# Patient Record
Sex: Male | Born: 1970 | Race: Black or African American | Hispanic: Yes | Marital: Married | Smoking: Former smoker
Health system: Southern US, Community
[De-identification: ages and names within clinical notes are randomized; demographics above are authoritative.]

## PROBLEM LIST (undated history)

## (undated) DIAGNOSIS — F1010 Alcohol abuse, uncomplicated: Secondary | ICD-10-CM

## (undated) DIAGNOSIS — K7030 Alcoholic cirrhosis of liver without ascites: Secondary | ICD-10-CM

## (undated) DIAGNOSIS — C220 Liver cell carcinoma: Secondary | ICD-10-CM

## (undated) DIAGNOSIS — Z8739 Personal history of other diseases of the musculoskeletal system and connective tissue: Secondary | ICD-10-CM

## (undated) DIAGNOSIS — K7010 Alcoholic hepatitis without ascites: Secondary | ICD-10-CM

## (undated) HISTORY — PX: OTHER SURGICAL HISTORY: SHX169

## (undated) HISTORY — DX: Liver cell carcinoma: C22.0

---

## 2015-12-26 ENCOUNTER — Emergency Department (HOSPITAL_COMMUNITY): Payer: BLUE CROSS/BLUE SHIELD

## 2015-12-26 ENCOUNTER — Inpatient Hospital Stay (HOSPITAL_COMMUNITY)
Admission: EM | Admit: 2015-12-26 | Discharge: 2015-12-31 | DRG: 378 | Disposition: A | Discharge status: Home / Self Care | Payer: BLUE CROSS/BLUE SHIELD | Attending: Internal Medicine | Admitting: Internal Medicine

## 2015-12-26 ENCOUNTER — Encounter (HOSPITAL_COMMUNITY): Payer: Self-pay | Admitting: Emergency Medicine

## 2015-12-26 DIAGNOSIS — I8500 Esophageal varices without bleeding: Secondary | ICD-10-CM | POA: Diagnosis present

## 2015-12-26 DIAGNOSIS — K921 Melena: Principal | ICD-10-CM | POA: Diagnosis present

## 2015-12-26 DIAGNOSIS — R17 Unspecified jaundice: Secondary | ICD-10-CM

## 2015-12-26 DIAGNOSIS — K429 Umbilical hernia without obstruction or gangrene: Secondary | ICD-10-CM | POA: Diagnosis present

## 2015-12-26 DIAGNOSIS — Z825 Family history of asthma and other chronic lower respiratory diseases: Secondary | ICD-10-CM

## 2015-12-26 DIAGNOSIS — K7011 Alcoholic hepatitis with ascites: Secondary | ICD-10-CM | POA: Diagnosis present

## 2015-12-26 DIAGNOSIS — R0609 Other forms of dyspnea: Secondary | ICD-10-CM

## 2015-12-26 DIAGNOSIS — K766 Portal hypertension: Secondary | ICD-10-CM | POA: Diagnosis present

## 2015-12-26 DIAGNOSIS — D649 Anemia, unspecified: Secondary | ICD-10-CM | POA: Diagnosis present

## 2015-12-26 DIAGNOSIS — R600 Localized edema: Secondary | ICD-10-CM

## 2015-12-26 DIAGNOSIS — K649 Unspecified hemorrhoids: Secondary | ICD-10-CM | POA: Diagnosis present

## 2015-12-26 DIAGNOSIS — R0602 Shortness of breath: Secondary | ICD-10-CM | POA: Diagnosis not present

## 2015-12-26 DIAGNOSIS — F1010 Alcohol abuse, uncomplicated: Secondary | ICD-10-CM

## 2015-12-26 DIAGNOSIS — K7030 Alcoholic cirrhosis of liver without ascites: Secondary | ICD-10-CM

## 2015-12-26 DIAGNOSIS — D62 Acute posthemorrhagic anemia: Secondary | ICD-10-CM | POA: Diagnosis present

## 2015-12-26 DIAGNOSIS — F17210 Nicotine dependence, cigarettes, uncomplicated: Secondary | ICD-10-CM | POA: Diagnosis present

## 2015-12-26 DIAGNOSIS — E876 Hypokalemia: Secondary | ICD-10-CM | POA: Diagnosis not present

## 2015-12-26 DIAGNOSIS — E871 Hypo-osmolality and hyponatremia: Secondary | ICD-10-CM | POA: Diagnosis present

## 2015-12-26 DIAGNOSIS — Z6841 Body Mass Index (BMI) 40.0 and over, adult: Secondary | ICD-10-CM

## 2015-12-26 DIAGNOSIS — K7031 Alcoholic cirrhosis of liver with ascites: Secondary | ICD-10-CM

## 2015-12-26 DIAGNOSIS — R609 Edema, unspecified: Secondary | ICD-10-CM

## 2015-12-26 DIAGNOSIS — K7290 Hepatic failure, unspecified without coma: Secondary | ICD-10-CM

## 2015-12-26 DIAGNOSIS — K3189 Other diseases of stomach and duodenum: Secondary | ICD-10-CM | POA: Diagnosis present

## 2015-12-26 HISTORY — DX: Alcoholic hepatitis without ascites: K70.10

## 2015-12-26 HISTORY — DX: Alcoholic cirrhosis of liver without ascites: K70.30

## 2015-12-26 HISTORY — DX: Alcohol abuse, uncomplicated: F10.10

## 2015-12-26 MED ORDER — FUROSEMIDE 10 MG/ML IJ SOLN
60.0000 mg | Freq: Once | INTRAMUSCULAR | Status: AC
  Administered 2015-12-26: 60 mg via INTRAVENOUS
  Filled 2015-12-26: qty 6

## 2015-12-26 NOTE — ED Provider Notes (Signed)
[facility] DEPT Provider Note   CSN: [id number] Arrival date & time: 12/26/15  2147  First Provider Contact:  First MD Initiated Contact with Patient 12/26/15 2329     By signing my name below, I, Moises Blood, attest that this documentation has been prepared under the direction and in the presence of Rolland Porter, MD. Electronically Signed: Moises Blood, [place]. 12/27/2015 , 12:18 AM .  Patient was seen in Room IC12/IC12-01 .   History   Chief Complaint Chief Complaint  Patient presents with  . Shortness of Breath    HPI Antonio [name] is a 45 y.o. male.  HPI Patient is complaining of gradual onset bilateral lower leg swelling and lower abdominal swelling that started a week ago. His legs have swollen in the past due to fluid retention. It was noted that he has a liver problem and is followed at [facility], by Dr. Arvilla Market. He informs having a paracentesis in January when he was admitted to [facility].    He also complains feeling SOB and wheezing with activity that started about 1.5 weeks ago. He reports becoming SOB when he walks out of his house to get mail and walk back in; would have to sit down to catch his breath for relief. He also mentions having diarrhea which started about 1 week ago, about 2~3 times a day. He also states decreased and darker urination noticed 1.5 weeks ago. He denies chest pain, coughing, nausea, vomiting, abdominal pain, or fever.   He is a current smoker, about 1 pack a week.  He is currently drinking, about 3~4 beers and a pint of liquor a day. He was advised to stop drinking alcohol; however, he stopped but restarts drinking again.   PCP Dr Scotty Court GI Dr Arvilla Market at [facility]  History reviewed. No pertinent past medical history.  Patient Active Problem List   Diagnosis Date Noted  . Anemia 12/27/2015    History reviewed. No pertinent surgical history.     Home Medications    Prior to Admission medications   Medication Sig Start  Date End Date Taking? Authorizing Provider  Calcium Carb-Cholecalciferol (CALCIUM 600+D) 600-800 MG-UNIT TABS Take 1 tablet by mouth daily.   Yes Historical Provider, MD  furosemide (LASIX) 20 MG tablet Take 20 mg by mouth daily as needed for fluid.   Yes Historical Provider, MD  potassium chloride SA (K-DUR,KLOR-CON) 20 MEQ tablet Take 20 mEq by mouth 2 (two) times daily.   Yes Historical Provider, MD  spironolactone (ALDACTONE) 100 MG tablet Take 100 mg by mouth daily.   Yes Historical Provider, MD    Family History No family history on file.  Social History Social History  Substance Use Topics  . Smoking status: Current Some Day Smoker    Packs/day: 0.25    Types: Cigarettes  . Smokeless tobacco: Never Used  . Alcohol use 3.6 oz/week    6 Cans of beer per week     Comment: pint   unemployed Drinks 3-4 beers + 1/2 pint daily   Allergies   Review of patient's allergies indicates no known allergies.   Review of Systems Review of Systems  Constitutional: Negative for chills, fatigue and fever.  Respiratory: Positive for shortness of breath and wheezing.   Cardiovascular: Negative for chest pain.  Gastrointestinal: Positive for abdominal distention and diarrhea. Negative for abdominal pain, nausea and vomiting.  All other systems reviewed and are negative.    Physical Exam Updated Vital Signs BP (!) 114/54   Pulse  105   Temp 98.1 F (36.7 C) (Oral)   Resp 20   Ht 6\' 2"  (1.88 m)   Wt 225 lb (102.1 kg)   SpO2 100%   BMI 28.89 kg/m   Vital signs normal except for tachycardia   Physical Exam  Constitutional: He is oriented to person, place, and time. He appears well-developed and well-nourished.  Non-toxic appearance. He does not appear ill. No distress.  Morbidly obese  HENT:  Head: Normocephalic and atraumatic.  Right Ear: External ear normal.  Left Ear: External ear normal.  Nose: Nose normal. No mucosal edema or rhinorrhea.  Mouth/Throat: Oropharynx is  clear and moist and mucous membranes are normal. No dental abscesses or uvula swelling.  Mucous membrane have a yellow tint  Eyes: Conjunctivae and EOM are normal. Pupils are equal, round, and reactive to light.  Bilateral sclera icterus  Neck: Normal range of motion and full passive range of motion without pain. Neck supple.  Cardiovascular: Normal rate, regular rhythm and normal heart sounds.  Exam reveals no gallop and no friction rub.   No murmur heard. Pulmonary/Chest: Effort normal and breath sounds normal. No respiratory distress. He has no wheezes. He has no rhonchi. He has no rales. He exhibits no tenderness and no crepitus.  Abdominal: Soft. Normal appearance and bowel sounds are normal. He exhibits no distension. There is no tenderness. There is no rebound and no guarding.  Mild pitting edema of his lower abd wall without tenderness  Musculoskeletal: Normal range of motion. He exhibits no edema or tenderness.  2+ pitting edema of his feet and lower legs without edema above the knees  Neurological: He is alert and oriented to person, place, and time. He has normal strength. No cranial nerve deficit.  Skin: Skin is warm, dry and intact. No rash noted. No erythema. No pallor.  Psychiatric: He has a normal mood and affect. His speech is normal and behavior is normal. His mood appears not anxious.  Nursing note and vitals reviewed.    ED Treatments / Results  Labs (all labs ordered are listed, but only abnormal results are displayed) Results for orders placed or performed during the hospital encounter of 12/26/15  Comprehensive metabolic panel  Result Value Ref Range   Sodium 127 (L) 135 - 145 mmol/L   Potassium 3.9 3.5 - 5.1 mmol/L   Chloride 105 101 - 111 mmol/L   CO2 15 (L) 22 - 32 mmol/L   Glucose, Bld 102 (H) 65 - 99 mg/dL   BUN 16 6 - 20 mg/dL   Creatinine, Ser 1.60 (H) 0.61 - 1.24 mg/dL   Calcium 7.4 (L) 8.9 - 10.3 mg/dL   Total Protein 6.4 (L) 6.5 - 8.1 g/dL   Albumin  1.5 (L) 3.5 - 5.0 g/dL   AST 85 (H) 15 - 41 U/L   ALT 34 17 - 63 U/L   Alkaline Phosphatase 122 38 - 126 U/L   Total Bilirubin 5.8 (H) 0.3 - 1.2 mg/dL   GFR calc non Af Amer 51 (L) >60 mL/min   GFR calc Af Amer 59 (L) >60 mL/min   Anion gap 7 5 - 15  CBC with Differential  Result Value Ref Range   WBC 10.1 4.0 - 10.5 K/uL   RBC 1.72 (L) 4.22 - 5.81 MIL/uL   Hemoglobin 5.8 (LL) 13.0 - 17.0 g/dL   HCT 17.9 (L) 39.0 - 52.0 %   MCV 104.1 (H) 78.0 - 100.0 fL   MCH 33.7 26.0 -  34.0 pg   MCHC 32.4 30.0 - 36.0 g/dL   RDW 16.7 (H) 11.5 - 15.5 %   Platelets 131 (L) 150 - 400 K/uL   Neutrophils Relative % 67 %   Neutro Abs 6.8 1.7 - 7.7 K/uL   Lymphocytes Relative 18 %   Lymphs Abs 1.9 0.7 - 4.0 K/uL   Monocytes Relative 13 %   Monocytes Absolute 1.3 (H) 0.1 - 1.0 K/uL   Eosinophils Relative 1 %   Eosinophils Absolute 0.1 0.0 - 0.7 K/uL   Basophils Relative 1 %   Basophils Absolute 0.1 0.0 - 0.1 K/uL   RBC Morphology POLYCHROMASIA PRESENT    Smear Review PLATELETS APPEAR ADEQUATE   Protime-INR  Result Value Ref Range   Prothrombin Time 28.5 (H) 11.4 - 15.2 seconds   INR 2.62   APTT  Result Value Ref Range   aPTT 45 (H) 24 - 36 seconds  Brain natriuretic peptide  Result Value Ref Range   B Natriuretic Peptide 30.0 0.0 - 100.0 pg/mL  Troponin I  Result Value Ref Range   Troponin I 0.05 (HH) <0.03 ng/mL  Magnesium  Result Value Ref Range   Magnesium 1.2 (L) 1.7 - 2.4 mg/dL  Sample to Blood Bank  Result Value Ref Range   Blood Bank Specimen SAMPLE AVAILABLE FOR TESTING    Sample Expiration 12/29/2015   Prepare RBC  Result Value Ref Range   Order Confirmation ORDER PROCESSED BY BLOOD BANK    Laboratory interpretation all normal except anemia, renal insufficiency, elevated total bilirubin, elevated INR/PTT b/o liver disease, low magnesium, + troponin, hyponatremia, malnutrition   EKG  EKG Interpretation  Date/Time:  Monday December 26 2015 22:07:22 EDT Ventricular Rate:   110 PR Interval:    QRS Duration: 99 QT Interval:  356 QTC Calculation: 482 R Axis:   68 Text Interpretation:  Sinus tachycardia Borderline T abnormalities, inferior leads Borderline prolonged QT interval Baseline wander in lead(s) V5 No old tracing to compare Confirmed by Havilah Topor  MD-I, Jasslyn Finkel ([id number]) on 12/27/2015 12:13:19 AM       Radiology Dg Chest 2 View  Result Date: 12/27/2015 CLINICAL DATA:  Intermittent shortness of breath, bilateral lower extremity swelling for 1-1/2 weeks. History of liver disease. EXAM: CHEST  2 VIEW COMPARISON:  None. FINDINGS: Cardiomediastinal silhouette is unremarkable for this low inspiratory examination with crowded vasculature markings. The lungs are clear without pleural effusions or focal consolidations. Trachea projects midline and there is no pneumothorax. Included soft tissue planes and osseous structures are non-suspicious. Abdominal distention noted on the lateral radiograph. IMPRESSION: No active cardiopulmonary disease. Electronically Signed   By: Elon Alas M.D.   On: 12/27/2015 00:07    Procedures Procedures (including critical care time)  Medications Ordered in ED Medications  0.9 %  sodium chloride infusion (not administered)  magnesium sulfate IVPB 2 g 50 mL (2 g Intravenous Transfusing/Transfer 12/27/15 0222)  furosemide (LASIX) injection 60 mg (60 mg Intravenous Given 12/26/15 2358)     Initial Impression / Assessment and Plan / ED Course  I have reviewed the triage vital signs and the nursing notes.  Pertinent labs & imaging results that were available during my care of the patient were reviewed by me and considered in my medical decision making (see chart for details).  Clinical Course   Laboratory testing was ordered. Patient was given Lasix to see if that would help him start to diurese the excess fluid in his legs and his abdominal wall.  After  reviewing patient's laboratory results he was given IV magnesium.  I reviewed his  prior blood testing from [facility]. His last hemoglobin was January 2016 which was 13.6. His creatinines had been normal. He also was noted have a persistent hypomagnesemia.  I have talked to the patient about his test results. He's noted to be very anemic which is probably part of the reason he is having the dyspnea on exertion. He is agreeable to getting a blood transfusion. Patient was prepared to get blood transfusion. He is also agreeable to be admitting to the hospital.  He did not have any urinary output after the IV Lasix he was given.  1:47 AM Dr. Darrick Meigs, hospitalist states to admit to telemetry, observation    Final Clinical Impressions(s) / ED Diagnoses   Final diagnoses:  Anemia, unspecified  DOE (dyspnea on exertion)  Peripheral edema  Jaundice  Alcoholic cirrhosis of liver with ascites ([place])    Plan admission  Rolland Porter, MD, FACEP   I personally performed the services described in this documentation, which was scribed in my presence. The recorded information has been reviewed and considered.   Rolland Porter, MD, Barbette Or, MD 12/27/15 [phone]

## 2015-12-26 NOTE — ED Notes (Signed)
Patient transported to X-ray 

## 2015-12-26 NOTE — ED Triage Notes (Signed)
Pt states he drinks a 6 pack and a pint a day. Eye yellow, states he is being treated for edema. State legs swelling is worse and has become SOB.

## 2015-12-26 NOTE — ED Notes (Signed)
MD at bedside. 

## 2015-12-27 ENCOUNTER — Encounter (HOSPITAL_COMMUNITY): Payer: Self-pay | Admitting: *Deleted

## 2015-12-27 DIAGNOSIS — E871 Hypo-osmolality and hyponatremia: Secondary | ICD-10-CM | POA: Diagnosis present

## 2015-12-27 DIAGNOSIS — D62 Acute posthemorrhagic anemia: Secondary | ICD-10-CM | POA: Diagnosis present

## 2015-12-27 DIAGNOSIS — R0609 Other forms of dyspnea: Secondary | ICD-10-CM | POA: Diagnosis not present

## 2015-12-27 DIAGNOSIS — F1010 Alcohol abuse, uncomplicated: Secondary | ICD-10-CM | POA: Diagnosis present

## 2015-12-27 DIAGNOSIS — K766 Portal hypertension: Secondary | ICD-10-CM | POA: Diagnosis present

## 2015-12-27 DIAGNOSIS — K7011 Alcoholic hepatitis with ascites: Secondary | ICD-10-CM | POA: Diagnosis present

## 2015-12-27 DIAGNOSIS — E876 Hypokalemia: Secondary | ICD-10-CM | POA: Diagnosis not present

## 2015-12-27 DIAGNOSIS — K7030 Alcoholic cirrhosis of liver without ascites: Secondary | ICD-10-CM

## 2015-12-27 DIAGNOSIS — D500 Iron deficiency anemia secondary to blood loss (chronic): Secondary | ICD-10-CM

## 2015-12-27 DIAGNOSIS — K3189 Other diseases of stomach and duodenum: Secondary | ICD-10-CM | POA: Diagnosis present

## 2015-12-27 DIAGNOSIS — R0602 Shortness of breath: Secondary | ICD-10-CM | POA: Diagnosis present

## 2015-12-27 DIAGNOSIS — K649 Unspecified hemorrhoids: Secondary | ICD-10-CM | POA: Diagnosis present

## 2015-12-27 DIAGNOSIS — K7460 Unspecified cirrhosis of liver: Secondary | ICD-10-CM

## 2015-12-27 DIAGNOSIS — F17210 Nicotine dependence, cigarettes, uncomplicated: Secondary | ICD-10-CM | POA: Diagnosis present

## 2015-12-27 DIAGNOSIS — D649 Anemia, unspecified: Secondary | ICD-10-CM | POA: Diagnosis present

## 2015-12-27 DIAGNOSIS — R600 Localized edema: Secondary | ICD-10-CM | POA: Diagnosis present

## 2015-12-27 DIAGNOSIS — K429 Umbilical hernia without obstruction or gangrene: Secondary | ICD-10-CM | POA: Diagnosis present

## 2015-12-27 DIAGNOSIS — K7290 Hepatic failure, unspecified without coma: Secondary | ICD-10-CM | POA: Diagnosis present

## 2015-12-27 DIAGNOSIS — Z825 Family history of asthma and other chronic lower respiratory diseases: Secondary | ICD-10-CM | POA: Diagnosis not present

## 2015-12-27 DIAGNOSIS — Z6841 Body Mass Index (BMI) 40.0 and over, adult: Secondary | ICD-10-CM | POA: Diagnosis not present

## 2015-12-27 DIAGNOSIS — K921 Melena: Secondary | ICD-10-CM | POA: Diagnosis present

## 2015-12-27 DIAGNOSIS — K7031 Alcoholic cirrhosis of liver with ascites: Secondary | ICD-10-CM | POA: Diagnosis present

## 2015-12-27 DIAGNOSIS — I8500 Esophageal varices without bleeding: Secondary | ICD-10-CM | POA: Diagnosis present

## 2015-12-27 LAB — CBC WITH DIFFERENTIAL/PLATELET
BASOS ABS: 0.1 10*3/uL (ref 0.0–0.1)
Basophils Relative: 1 %
EOS ABS: 0.1 10*3/uL (ref 0.0–0.7)
EOS PCT: 1 %
HCT: 17.9 % — ABNORMAL LOW (ref 39.0–52.0)
HEMOGLOBIN: 5.8 g/dL — AB (ref 13.0–17.0)
LYMPHS PCT: 18 %
Lymphs Abs: 1.9 10*3/uL (ref 0.7–4.0)
MCH: 33.7 pg (ref 26.0–34.0)
MCHC: 32.4 g/dL (ref 30.0–36.0)
MCV: 104.1 fL — ABNORMAL HIGH (ref 78.0–100.0)
MONO ABS: 1.3 10*3/uL — AB (ref 0.1–1.0)
Monocytes Relative: 13 %
Neutro Abs: 6.8 10*3/uL (ref 1.7–7.7)
Neutrophils Relative %: 67 %
Platelets: 131 10*3/uL — ABNORMAL LOW (ref 150–400)
RBC: 1.72 MIL/uL — AB (ref 4.22–5.81)
RDW: 16.7 % — AB (ref 11.5–15.5)
Smear Review: ADEQUATE
WBC: 10.1 10*3/uL (ref 4.0–10.5)

## 2015-12-27 LAB — MAGNESIUM: MAGNESIUM: 1.2 mg/dL — AB (ref 1.7–2.4)

## 2015-12-27 LAB — COMPREHENSIVE METABOLIC PANEL
ALBUMIN: 1.5 g/dL — AB (ref 3.5–5.0)
ALK PHOS: 122 U/L (ref 38–126)
ALT: 34 U/L (ref 17–63)
ALT: 34 U/L (ref 17–63)
ANION GAP: 12 (ref 5–15)
AST: 85 U/L — AB (ref 15–41)
AST: 85 U/L — ABNORMAL HIGH (ref 15–41)
Albumin: 1.5 g/dL — ABNORMAL LOW (ref 3.5–5.0)
Alkaline Phosphatase: 115 U/L (ref 38–126)
Anion gap: 7 (ref 5–15)
BILIRUBIN TOTAL: 5.8 mg/dL — AB (ref 0.3–1.2)
BILIRUBIN TOTAL: 5.6 mg/dL — AB (ref 0.3–1.2)
BUN: 16 mg/dL (ref 6–20)
BUN: 17 mg/dL (ref 6–20)
CALCIUM: 7.4 mg/dL — AB (ref 8.9–10.3)
CO2: 15 mmol/L — ABNORMAL LOW (ref 22–32)
CO2: 16 mmol/L — ABNORMAL LOW (ref 22–32)
CREATININE: 1.6 mg/dL — AB (ref 0.61–1.24)
Calcium: 7.6 mg/dL — ABNORMAL LOW (ref 8.9–10.3)
Chloride: 105 mmol/L (ref 101–111)
Chloride: 102 mmol/L (ref 101–111)
Creatinine, Ser: 1.72 mg/dL — ABNORMAL HIGH (ref 0.61–1.24)
GFR calc Af Amer: 59 mL/min — ABNORMAL LOW (ref >60)
GFR, EST AFRICAN AMERICAN: 54 mL/min — AB (ref >60)
GFR, EST NON AFRICAN AMERICAN: 51 mL/min — AB (ref >60)
GFR, EST NON AFRICAN AMERICAN: 47 mL/min — AB (ref >60)
GLUCOSE: 102 mg/dL — AB (ref 65–99)
Glucose, Bld: 107 mg/dL — ABNORMAL HIGH (ref 65–99)
POTASSIUM: 4.1 mmol/L (ref 3.5–5.1)
Potassium: 3.9 mmol/L (ref 3.5–5.1)
Sodium: 127 mmol/L — ABNORMAL LOW (ref 135–145)
Sodium: 130 mmol/L — ABNORMAL LOW (ref 135–145)
TOTAL PROTEIN: 6.4 g/dL — AB (ref 6.5–8.1)
TOTAL PROTEIN: 6.4 g/dL — AB (ref 6.5–8.1)

## 2015-12-27 LAB — HEMOGLOBIN AND HEMATOCRIT, BLOOD
HEMATOCRIT: 21.2 % — AB (ref 39.0–52.0)
HEMATOCRIT: 20.4 % — AB (ref 39.0–52.0)
HEMOGLOBIN: 6.8 g/dL — AB (ref 13.0–17.0)
HEMOGLOBIN: 6.8 g/dL — AB (ref 13.0–17.0)

## 2015-12-27 LAB — CBC
HEMATOCRIT: 18.8 % — AB (ref 39.0–52.0)
Hemoglobin: 6 g/dL — CL (ref 13.0–17.0)
MCH: 32.8 pg (ref 26.0–34.0)
MCHC: 31.9 g/dL (ref 30.0–36.0)
MCV: 102.7 fL — AB (ref 78.0–100.0)
Platelets: 120 10*3/uL — ABNORMAL LOW (ref 150–400)
RBC: 1.83 MIL/uL — ABNORMAL LOW (ref 4.22–5.81)
RDW: 17 % — AB (ref 11.5–15.5)
WBC: 10.6 10*3/uL — ABNORMAL HIGH (ref 4.0–10.5)

## 2015-12-27 LAB — PREPARE RBC (CROSSMATCH)

## 2015-12-27 LAB — APTT: aPTT: 45 seconds — ABNORMAL HIGH (ref 24–36)

## 2015-12-27 LAB — TROPONIN I
Troponin I: 0.05 ng/mL (ref <0.03)
Troponin I: 0.04 ng/mL (ref <0.03)
Troponin I: 0.04 ng/mL (ref <0.03)
Troponin I: 0.04 ng/mL (ref <0.03)

## 2015-12-27 LAB — SAMPLE TO BLOOD BANK

## 2015-12-27 LAB — URINALYSIS, ROUTINE W REFLEX MICROSCOPIC
GLUCOSE, UA: NEGATIVE mg/dL
KETONES UR: NEGATIVE mg/dL
LEUKOCYTES UA: NEGATIVE
Nitrite: NEGATIVE
Specific Gravity, Urine: 1.01 (ref 1.005–1.030)
pH: 6 (ref 5.0–8.0)

## 2015-12-27 LAB — MRSA PCR SCREENING: MRSA BY PCR: NEGATIVE

## 2015-12-27 LAB — URINE MICROSCOPIC-ADD ON

## 2015-12-27 LAB — ABO/RH: ABO/RH(D): O NEG

## 2015-12-27 LAB — BRAIN NATRIURETIC PEPTIDE: B NATRIURETIC PEPTIDE 5: 30 pg/mL (ref 0.0–100.0)

## 2015-12-27 LAB — PROTIME-INR
INR: 2.62
PROTHROMBIN TIME: 28.5 s — AB (ref 11.4–15.2)

## 2015-12-27 MED ORDER — VITAMIN K1 10 MG/ML IJ SOLN
10.0000 mg | Freq: Every day | INTRAVENOUS | Status: AC
  Administered 2015-12-27 – 2015-12-29 (×3): 10 mg via INTRAVENOUS
  Filled 2015-12-27 (×3): qty 1

## 2015-12-27 MED ORDER — SODIUM CHLORIDE 0.9 % IV SOLN
80.0000 mg | Freq: Once | INTRAVENOUS | Status: AC
  Administered 2015-12-27: 80 mg via INTRAVENOUS
  Filled 2015-12-27: qty 80

## 2015-12-27 MED ORDER — SODIUM CHLORIDE 0.9 % IV SOLN
50.0000 ug/h | INTRAVENOUS | Status: DC
  Administered 2015-12-27 – 2015-12-28 (×3): 50 ug/h via INTRAVENOUS
  Filled 2015-12-27 (×6): qty 1

## 2015-12-27 MED ORDER — DEXTROSE 5 % IV SOLN
1.0000 g | INTRAVENOUS | Status: DC
  Administered 2015-12-27 – 2015-12-29 (×3): 1 g via INTRAVENOUS
  Filled 2015-12-27 (×3): qty 10

## 2015-12-27 MED ORDER — VITAMIN B-1 100 MG PO TABS
100.0000 mg | ORAL_TABLET | Freq: Every day | ORAL | Status: DC
  Administered 2015-12-27 – 2015-12-31 (×4): 100 mg via ORAL
  Filled 2015-12-27 (×5): qty 1

## 2015-12-27 MED ORDER — OCTREOTIDE LOAD VIA INFUSION
50.0000 ug | Freq: Once | INTRAVENOUS | Status: AC
  Administered 2015-12-27: 50 ug via INTRAVENOUS
  Filled 2015-12-27: qty 25

## 2015-12-27 MED ORDER — SODIUM CHLORIDE 0.9 % IV SOLN: Freq: Once | INTRAVENOUS | Status: AC

## 2015-12-27 MED ORDER — MAGNESIUM SULFATE 2 GM/50ML IV SOLN
2.0000 g | Freq: Once | INTRAVENOUS | Status: AC
  Administered 2015-12-27: 2 g via INTRAVENOUS
  Filled 2015-12-27: qty 50

## 2015-12-27 MED ORDER — THIAMINE HCL 100 MG/ML IJ SOLN
100.0000 mg | Freq: Every day | INTRAMUSCULAR | Status: DC
  Administered 2015-12-28: 100 mg via INTRAVENOUS
  Filled 2015-12-27: qty 2

## 2015-12-27 MED ORDER — SODIUM CHLORIDE 0.9 % IV SOLN
8.0000 mg/h | INTRAVENOUS | Status: DC
  Administered 2015-12-27 – 2015-12-28 (×3): 8 mg/h via INTRAVENOUS
  Filled 2015-12-27 (×6): qty 80

## 2015-12-27 MED ORDER — SODIUM CHLORIDE 0.9 % IV SOLN
Freq: Once | INTRAVENOUS | Status: AC
  Administered 2015-12-28: 12:00:00 via INTRAVENOUS

## 2015-12-27 MED ORDER — OCTREOTIDE ACETATE 50 MCG/ML IJ SOLN: 50.0000 ug | Freq: Once | INTRAMUSCULAR | Status: DC

## 2015-12-27 MED ORDER — FOLIC ACID 1 MG PO TABS
1.0000 mg | ORAL_TABLET | Freq: Every day | ORAL | Status: DC
  Administered 2015-12-27 – 2015-12-31 (×4): 1 mg via ORAL
  Filled 2015-12-27 (×5): qty 1

## 2015-12-27 MED ORDER — PANTOPRAZOLE SODIUM 40 MG IV SOLR: 40.0000 mg | Freq: Two times a day (BID) | INTRAVENOUS | Status: DC

## 2015-12-27 MED ORDER — LORAZEPAM 1 MG PO TABS: 1.0000 mg | ORAL_TABLET | Freq: Four times a day (QID) | ORAL | Status: AC | PRN

## 2015-12-27 MED ORDER — ADULT MULTIVITAMIN W/MINERALS CH
1.0000 | ORAL_TABLET | Freq: Every day | ORAL | Status: DC
  Administered 2015-12-27 – 2015-12-31 (×4): 1 via ORAL
  Filled 2015-12-27 (×4): qty 1

## 2015-12-27 MED ORDER — PREDNISONE 20 MG PO TABS
40.0000 mg | ORAL_TABLET | Freq: Every day | ORAL | Status: DC
  Administered 2015-12-27: 40 mg via ORAL
  Filled 2015-12-27 (×2): qty 2

## 2015-12-27 MED ORDER — LORAZEPAM 2 MG/ML IJ SOLN: 1.0000 mg | Freq: Four times a day (QID) | INTRAMUSCULAR | Status: AC | PRN

## 2015-12-27 MED ORDER — SODIUM CHLORIDE 0.9 % IV SOLN: Freq: Once | INTRAVENOUS | Status: DC

## 2015-12-27 NOTE — ED Notes (Signed)
MD at bedside. 

## 2015-12-27 NOTE — ED Notes (Signed)
CRITICAL VALUE ALERT  Critical value received:  Hgb 5.8  Date of notification:  12/27/15  Time of notification:  0040  Critical value read back:Yes.    Nurse who received alert: Toma Deiters  MD notified (1st page):  Dr Tomi Bamberger  Time of first page:  0040  MD notified (2nd page):  Time of second page:  Responding MD:  Dr Tomi Bamberger  Time MD responded:  0040

## 2015-12-27 NOTE — H&P (Addendum)
[facility] H&P   Patient Demographics:    Antonio [name], is a 45 y.o. male  MRN: [id number]  DOB - 1971/01/01  Admit Date - 12/26/2015  Outpatient Primary MD for the patient is TAPPER,DAVID B, MD  Referring MD/NP/PA: Dr Tomi Bamberger  Patient coming from: Home  Chief Complaint  Patient presents with  . Shortness of Breath      HPI:    Antonio [name]  is a 45 y.o. male, With history of alcohol liver disease who came to the hospital with worsening leg swelling and shortness of breath on exertion. Patient usually follows [facility] for liver cirrhosis, but has not seen GI since 2016. He denies nausea vomiting, but admits to having diarrhea with black colored stool and also intermittently with frank blood. He denies chest pain. Patient drinks 3-4 beers and a pint of liquor everyday. In the ED lab work revealed anemia with hemoglobin 5.8    Review of systems:    In addition to the HPI above,  No Fever-chills, No Headache, No changes with Vision or hearing, No problems swallowing food or Liquids, No Chest pain, Cough or Shortness of Breath, No Blood in stool or Urine, No dysuria, No new skin rashes or bruises, No new joints pains-aches,  No new weakness, tingling, numbness in any extremity, No recent weight gain or loss, No polyuria, polydypsia or polyphagia, No significant Mental Stressors.  A full 10 point Review of Systems was done, except as stated above, all other Review of Systems were negative.   With Past History of the following :      Social History:     Social History  Substance Use Topics  . Smoking status: Current Some Day Smoker    Packs/day: 0.25    Types: Cigarettes  . Smokeless tobacco: Never Used  . Alcohol use 3.6 oz/week    6 Cans of beer per week     Comment: pint         Family History :   Patient's mother has history of COPD   Home Medications:   Prior to Admission  medications   Medication Sig Start Date End Date Taking? Authorizing Provider  Calcium Carb-Cholecalciferol (CALCIUM 600+D) 600-800 MG-UNIT TABS Take 1 tablet by mouth daily.   Yes Historical Provider, MD  furosemide (LASIX) 20 MG tablet Take 20 mg by mouth daily as needed for fluid.   Yes Historical Provider, MD  potassium chloride SA (K-DUR,KLOR-CON) 20 MEQ tablet Take 20 mEq by mouth 2 (two) times daily.   Yes Historical Provider, MD  spironolactone (ALDACTONE) 100 MG tablet Take 100 mg by mouth daily.   Yes Historical Provider, MD     Allergies:    No Known Allergies   Physical Exam:   Vitals  Blood pressure (!) 114/54, pulse 105, temperature 98.1 F (36.7 C), temperature source Oral, resp. rate 20, height 6\' 2"  (1.88 m), weight 102.1 kg (225 lb), SpO2 100 %.   1. General african american male  lying in bed in NAD, cooperative  with exam  2. Normal affect and insight, Awake Alert, Oriented X 3.  3. No F.N deficits, ALL C.Nerves Intact, Strength 5/5 all 4 extremities, Sensation intact all 4 extremities, Plantars down going.  4. Ears and Eyes appear Normal, Conjunctivae clear, PERRLA. Moist Oral Mucosa.  5. Supple Neck, No JVD, No cervical lymphadenopathy appriciated, No Carotid Bruits.  6. Symmetrical Chest wall movement, Good air movement bilaterally, CTAB.  7. RRR, No Gallops, Rubs or Murmurs, No Parasternal Heave. B/L 2+ pitting edema of the lower extremities  8. Positive Bowel Sounds, Abdomen Soft,distended, , No organomegaly appriciated,No rebound -guarding or rigidity.  9.  No Cyanosis, Normal Skin Turgor, No Skin Rash or Bruise.  10. Good muscle tone,  joints appear normal , no effusions, Normal ROM.      Data Review:    CBC  Recent Labs Lab 12/26/15 2230  WBC 10.1  HGB 5.8*  HCT 17.9*  PLT 131*  MCV 104.1*  MCH 33.7  MCHC 32.4  RDW 16.7*  LYMPHSABS 1.9  MONOABS 1.3*  EOSABS 0.1  BASOSABS 0.1    ------------------------------------------------------------------------------------------------------------------  Chemistries   Recent Labs Lab 12/26/15 2230  NA 127*  K 3.9  CL 105  CO2 15*  GLUCOSE 102*  BUN 16  CREATININE 1.60*  CALCIUM 7.4*  MG 1.2*  AST 85*  ALT 34  ALKPHOS 122  BILITOT 5.8*   ------------------------------------------------------------------------------------------------------------------  ------------------------------------------------------------------------------------------------------------------ GFR: Estimated Creatinine Clearance: 75.9 mL/min (by C-G formula based on SCr of 1.6 mg/dL). Liver Function Tests:  Recent Labs Lab 12/26/15 2230  AST 85*  ALT 34  ALKPHOS 122  BILITOT 5.8*  PROT 6.4*  ALBUMIN 1.5*   Coagulation Profile:  Recent Labs Lab 12/26/15 2230  INR 2.62   Cardiac Enzymes:  Recent Labs Lab 12/26/15 2230  TROPONINI 0.05*   --------------------------------------------------------------------------------------------------------------- Urine analysis: No results found for: COLORURINE, APPEARANCEUR, LABSPEC, PHURINE, GLUCOSEU, HGBUR, BILIRUBINUR, KETONESUR, PROTEINUR, UROBILINOGEN, NITRITE, LEUKOCYTESUR    ----------------------------------------------------------------------------------------------------------------   Imaging Results:    Dg Chest 2 View  Result Date: 12/27/2015 CLINICAL DATA:  Intermittent shortness of breath, bilateral lower extremity swelling for 1-1/2 weeks. History of liver disease. EXAM: CHEST  2 VIEW COMPARISON:  None. FINDINGS: Cardiomediastinal silhouette is unremarkable for this low inspiratory examination with crowded vasculature markings. The lungs are clear without pleural effusions or focal consolidations. Trachea projects midline and there is no pneumothorax. Included soft tissue planes and osseous structures are non-suspicious. Abdominal distention noted on the lateral  radiograph. IMPRESSION: No active cardiopulmonary disease. Electronically Signed   By: Elon Alas M.D.   On: 12/27/2015 00:07    My personal review of EKG: Sinus tachycardia   Assessment & Plan:    Active Problems:   Anemia   Hyponatremia   Liver cirrhosis   Alcohol abuse  1. Anemia- likely from GI bleed, patient gives history of melena. Will transfuse 2 units PRBC. Check CBC in a.m. 2. Melena- patient will need EGD, consult GI in a.m. 3. Hyponatremia- likely from liver cirrhosis. Follow BMP in a.m. 4. Alcohol abuse- start CIWA protocol, folate and thiamine. 5. Dyspnea- likely from anemia, chest x-ray shows no acute abnormality. Has mild elevation of troponin 0.05, EKG shows sinus tachycardia. We'll cycle troponin.    DVT Prophylaxis-    SCDs   AM Labs Ordered, also please review Full Orders  Family Communication: Admission, patients condition and plan of care including tests being ordered have been discussed with the patient and his mother at bedside who indicate understanding and  agree with the plan and Code Status.  Code Status:  Full code  Admission status: Observation  Time spent in minutes : 60 min   Lorie Cleckley S M.D on 12/27/2015 at 2:17 AM  Between 7am to 7pm - Pager - [phone]. After 7pm go to [email or web address] - password [facility]  [facility] - Office  [phone]

## 2015-12-27 NOTE — Progress Notes (Signed)
Late entry:   Around 1450 called and spoke with primary nurse for patient regarding post-transfusion H/H. This appears to have been done around 0950 and was 6.8. Received report that patient had large bloody BM around 1400 this afternoon. I ordered a stat H/H that came back at 6.8.  I have spoken to the blood bank and asked that 4 units be available for patient. We will go ahead and transfuse 1 unit PRBCs now.   We will start Octreotide now, with 50 mcg bolus X 1 then 50 mcg/hour infusion. Vit K has been ordered per attending GI physician. Plans for EGD tomorrow with Dr. Oneida Alar.   Orvil Feil, ANP-BC [facility]    REVIEWED. AGREE. NO ADDITIONAL RECOMMENDATIONS.

## 2015-12-27 NOTE — Consult Note (Signed)
Referring Provider: Dr. Darrick Meigs  Primary Care Physician:  Deloria Lair, MD Primary Gastroenterologist:  Dr. Halford Chessman at [facility], Dr. Oneida Alar during admission at [facility]   Date of Admission: 12/27/15 Date of Consultation: 12/27/15  Reason for Consultation:  Melena, cirrhosis  HPI:  Antonio [name] is a 45 y.o. year old male with a history of ETOH hepatitis, ETOH cirrhosis, seen by [facility] in Jan 2016 (Dr. Eddie Dibbles Schmeltzer) in evaluation. At that time, Hep C antibody negative, advised he was not a transplant candidate due to ETOH abuse at the time but was counseled that he would be followed. Variceal screening was recommended but never completed, as he was lost to follow-up and never showed for subsequent visits. He was then admitted to [facility] late Nov 2016 with worsening leg edema, ascites, shortness of breath in the setting of continued ETOH use. MELD at that time 22. No evidence of SBP or suspicion for SBP. He was discharged with Lasix 40 mg daily and Aldactone 100 mg daily. US abdomen last completed Nov 2016 without [place]. Last paracentesis Jan 2016 with 2.1 liters removed. Hgb on discharge in Nov 2016 in the 13 range.  States he noted onset of black, tarry stool a few days ago. No hematemesis. No N/V. No abdominal pain. Belly felt tight, felt short of breath, legs swelling, which prompted seeking medical attention. Denies NSAIDs or aspirin powders. No mental status changes or confusion. No fever or chills. No fatigue or weakness. No prior EGD. Thinks he had a colonoscopy about 3 years ago in [place]. Drinks about 4-5 beers a day and 1/2 pint of liquor daily. States he has been told before that if he keeps drinking he will die. He thinks he may be willing to seek help to stop. Last saw black stool yesterday.   As an outpatient takes Lasix 20 mg daily as needed and aldactone 100 mg daily.    Past Medical History:  Diagnosis Date  . Alcohol abuse   . Alcoholic hepatitis   . Cirrhosis with alcoholism [facility])      Past Surgical History:  Procedure Laterality Date  . Colonoscopy     per patient around 2014 in [place]     Prior to Admission medications   Medication Sig Start Date End Date Taking? Authorizing Provider  Calcium Carb-Cholecalciferol (CALCIUM 600+D) 600-800 MG-UNIT TABS Take 1 tablet by mouth daily.   Yes Historical Provider, MD  furosemide (LASIX) 20 MG tablet Take 20 mg by mouth daily as needed for fluid.   Yes Historical Provider, MD  potassium chloride SA (K-DUR,KLOR-CON) 20 MEQ tablet Take 20 mEq by mouth 2 (two) times daily.   Yes Historical Provider, MD  spironolactone (ALDACTONE) 100 MG tablet Take 100 mg by mouth daily.   Yes Historical Provider, MD    Current Facility-Administered Medications  Medication Dose Route Frequency Provider Last Rate Last Dose  . 0.9 %  sodium chloride infusion   Intravenous Once Rolland Porter, MD      . folic acid (FOLVITE) tablet 1 mg  1 mg Oral Daily Oswald Hillock, MD      . LORazepam (ATIVAN) tablet 1 mg  1 mg Oral Q6H PRN Oswald Hillock, MD       Or  . LORazepam (ATIVAN) injection 1 mg  1 mg Intravenous Q6H PRN Oswald Hillock, MD      . multivitamin with minerals tablet 1 tablet  1 tablet Oral Daily Oswald Hillock, MD      .  thiamine (VITAMIN B-1) tablet 100 mg  100 mg Oral Daily Oswald Hillock, MD       Or  . thiamine (B-1) injection 100 mg  100 mg Intravenous Daily Oswald Hillock, MD        Allergies as of 12/26/2015  . (No Known Allergies)    Family History  Problem Relation Age of Onset  . Colon cancer Neg Hx   . Liver disease Neg Hx     Social History   Social History  . Marital status: Married    Spouse name: [name]  . Number of children: [name]  . Years of education: [name]   Occupational History  . Not on file.   Social History Main Topics  . Smoking status: Current Some Day Smoker    Packs/day: 0.25    Types: Cigarettes  . Smokeless tobacco: Never Used  . Alcohol use 3.6 oz/week    6 Cans of beer per week     Comment: drinks 4-5  beers a day and 1/2 pint of liquor daily   . Drug use: No  . Sexual activity: Yes   Other Topics Concern  . Not on file   Social History Narrative  . No narrative on file    Review of Systems: As noted in HPI   Physical Exam: Vital signs in last 24 hours: Temp:  [98.1 F (36.7 C)-98.5 F (36.9 C)] 98.1 F (36.7 C) (08/01 0824) Pulse Rate:  [103-112] 110 (08/01 0655) Resp:  [14-25] 22 (08/01 0655) BP: (103-132)/(54-74) 132/66 (08/01 0700) SpO2:  [100 %] 100 % (08/01 0630) Weight:  [225 lb (102.1 kg)-350 lb 1.5 oz (158.8 kg)] 350 lb 1.5 oz (158.8 kg) (08/01 0241) Last BM Date: 12/26/15 General:   Alert, well-developed, appears weak, older than stated age Head:  Normocephalic and atraumatic. Eyes:  Sclera clear, mild icterus  Ears:  Normal auditory acuity. Nose:  No deformity, discharge,  or lesions. Mouth:  No deformity or lesions, dentition normal. Lungs:  Clear throughout to auscultation.  Heart:  Regular rate and rhythm Abdomen:  Soft, obese, no TTP, no rebound or guarding, generalized anasarca,  Rectal:  Deferred until time of colonoscopy.   Msk:  Symmetrical without gross deformities. Normal posture. Extremities:  2-3+ pitting lower extremity edema, pedal edema, anasarca  Neurologic:  Alert and  oriented x4; negative asterixis  Skin:  Intact without significant lesions or rashes. Psych:  Alert and cooperative. Flat affect   Intake/Output from previous day: 07/31 0701 - 08/01 0700 In: 306 [Blood:306] Out: -  Intake/Output this shift: Total I/O In: 120 [P.O.:120] Out: -   Lab Results:  Recent Labs  12/26/15 2230 12/27/15 0345  WBC 10.1 10.6*  HGB 5.8* 6.0*  HCT 17.9* 18.8*  PLT 131* 120*   BMET  Recent Labs  12/26/15 2230 12/27/15 0345  NA 127* 130*  K 3.9 4.1  CL 105 102  CO2 15* 16*  GLUCOSE 102* 107*  BUN 16 17  CREATININE 1.60* 1.72*  CALCIUM 7.4* 7.6*   LFT  Recent Labs  12/26/15 2230 12/27/15 0345  PROT 6.4* 6.4*  ALBUMIN 1.5*  1.5*  AST 85* 85*  ALT 34 34  ALKPHOS 122 115  BILITOT 5.8* 5.6*   PT/INR  Recent Labs  12/26/15 2230  LABPROT 28.5*  INR 2.62    Studies/Results: Dg Chest 2 View  Result Date: 12/27/2015 CLINICAL DATA:  Intermittent shortness of breath, bilateral lower extremity swelling for 1-1/2 weeks. History of liver disease.  EXAM: CHEST  2 VIEW COMPARISON:  None. FINDINGS: Cardiomediastinal silhouette is unremarkable for this low inspiratory examination with crowded vasculature markings. The lungs are clear without pleural effusions or focal consolidations. Trachea projects midline and there is no pneumothorax. Included soft tissue planes and osseous structures are non-suspicious. Abdominal distention noted on the lateral radiograph. IMPRESSION: No active cardiopulmonary disease. Electronically Signed   By: Elon Alas M.D.   On: 12/27/2015 00:07    Impression: 45 year old male with history of ETOH cirrhosis, ETOH hepatitis, ETOH abuse, presenting with decompensated cirrhosis, acute blood loss anemia, melena. Receiving 2/2 units PRBCs at time of consultation. Has continued to drink alcohol daily despite recommendations for abstinence, with poor prognosis. Current discriminant function 66-84, MELD 28 on admission. He has already eaten breakfast this morning; he has been made NPO and will plan on EGD on 8/2 with Propofol. In interim, will need close monitoring, serial monitoring of H/H, transfusion as necessary. Antibiotic prophylaxis (Rocephin) once every 24 hours started as he has a known GI bleed in the setting of cirrhosis. Will discuss prednisolone therapy with Dr. Oneida Alar but will address acute blood loss anemia first. Further addressing of anasarca, ascites after EGD. Patient's overall prognosis is poor with his current behavior and clinical findings.    Plan: Start PPI infusion Rocephin IV every 24 hours NPO Follow H/H Transfuse as necessary EGD with Propofol on 8/2 by Dr. Oneida Alar   Further diuretic management after EGD completed.      LOS: 0 days    12/27/2015, 8:51 AM

## 2015-12-27 NOTE — Progress Notes (Signed)
hgb 6.0 up from 5.8  Is currently getting one unit of PRBCs transfused with one more to go

## 2015-12-27 NOTE — Progress Notes (Signed)
MD notified of Hgb 6.8 via text page. No new orders received at this time. Will continue to monitor.

## 2015-12-27 NOTE — Progress Notes (Signed)
Patient seen and examined, database reviewed. No family members at bedside. Patient admitted on 8/1 with complaints of melena and shortness of breath. He has a history of alcoholic liver disease who has increased leg swelling and shortness of breath. Also his hemoglobin was down to 5.8. Has been transfused 2 units PRBCs, will recheck posttransfusion CBC to see if further blood as needed. Has been seen by GI, would anticipate that they would schedule an EGD. We'll defer diuretic therapy to GI recommendations. We'll continue to follow.  Domingo Mend, MD [facility] Pager: [phone]

## 2015-12-28 ENCOUNTER — Inpatient Hospital Stay (HOSPITAL_COMMUNITY): Payer: BLUE CROSS/BLUE SHIELD | Admitting: Anesthesiology

## 2015-12-28 ENCOUNTER — Encounter (HOSPITAL_COMMUNITY): Payer: Self-pay | Admitting: *Deleted

## 2015-12-28 ENCOUNTER — Encounter (HOSPITAL_COMMUNITY)
Admission: EM | Disposition: A | Discharge status: Home / Self Care | Payer: Self-pay | Source: Home / Self Care | Attending: Internal Medicine

## 2015-12-28 DIAGNOSIS — I8500 Esophageal varices without bleeding: Secondary | ICD-10-CM

## 2015-12-28 DIAGNOSIS — K3189 Other diseases of stomach and duodenum: Secondary | ICD-10-CM

## 2015-12-28 DIAGNOSIS — D649 Anemia, unspecified: Secondary | ICD-10-CM

## 2015-12-28 DIAGNOSIS — K921 Melena: Principal | ICD-10-CM

## 2015-12-28 HISTORY — PX: ESOPHAGOGASTRODUODENOSCOPY (EGD) WITH PROPOFOL: SHX5813

## 2015-12-28 LAB — CBC
HEMATOCRIT: 20.2 % — AB (ref 39.0–52.0)
HEMOGLOBIN: 6.8 g/dL — AB (ref 13.0–17.0)
MCH: 31.9 pg (ref 26.0–34.0)
MCHC: 33.7 g/dL (ref 30.0–36.0)
MCV: 94.8 fL (ref 78.0–100.0)
Platelets: 93 10*3/uL — ABNORMAL LOW (ref 150–400)
RBC: 2.13 MIL/uL — ABNORMAL LOW (ref 4.22–5.81)
RDW: 19.3 % — AB (ref 11.5–15.5)
WBC: 8.9 10*3/uL (ref 4.0–10.5)

## 2015-12-28 LAB — COMPREHENSIVE METABOLIC PANEL
ALBUMIN: 1.5 g/dL — AB (ref 3.5–5.0)
ALT: 32 U/L (ref 17–63)
ANION GAP: 0 — AB (ref 5–15)
AST: 74 U/L — AB (ref 15–41)
Alkaline Phosphatase: 108 U/L (ref 38–126)
BUN: 18 mg/dL (ref 6–20)
CHLORIDE: 106 mmol/L (ref 101–111)
CO2: 21 mmol/L — ABNORMAL LOW (ref 22–32)
Calcium: 7.3 mg/dL — ABNORMAL LOW (ref 8.9–10.3)
Creatinine, Ser: 1.46 mg/dL — ABNORMAL HIGH (ref 0.61–1.24)
GFR calc Af Amer: >60 mL/min (ref >60)
GFR calc non Af Amer: 57 mL/min — ABNORMAL LOW (ref >60)
GLUCOSE: 128 mg/dL — AB (ref 65–99)
POTASSIUM: 4.6 mmol/L (ref 3.5–5.1)
Sodium: 126 mmol/L — ABNORMAL LOW (ref 135–145)
Total Bilirubin: 6 mg/dL — ABNORMAL HIGH (ref 0.3–1.2)
Total Protein: 6 g/dL — ABNORMAL LOW (ref 6.5–8.1)

## 2015-12-28 LAB — HEMOGLOBIN AND HEMATOCRIT, BLOOD
HCT: 23.2 % — ABNORMAL LOW (ref 39.0–52.0)
HEMOGLOBIN: 7.7 g/dL — AB (ref 13.0–17.0)

## 2015-12-28 LAB — AMMONIA: AMMONIA: 113 umol/L — AB (ref 9–35)

## 2015-12-28 LAB — PROTIME-INR
INR: 2.46
Prothrombin Time: 27.2 seconds — ABNORMAL HIGH (ref 11.4–15.2)

## 2015-12-28 SURGERY — ESOPHAGOGASTRODUODENOSCOPY (EGD) WITH PROPOFOL
Anesthesia: Monitor Anesthesia Care

## 2015-12-28 MED ORDER — SODIUM CHLORIDE 0.9% FLUSH
10.0000 mL | Freq: Two times a day (BID) | INTRAVENOUS | Status: DC
  Administered 2015-12-28 – 2015-12-29 (×2): 30 mL
  Administered 2015-12-29: 10 mL
  Administered 2015-12-30: 20 mL
  Administered 2015-12-30: 30 mL

## 2015-12-28 MED ORDER — PROPOFOL 10 MG/ML IV BOLUS
INTRAVENOUS | Status: DC | PRN
  Administered 2015-12-28 (×2): 20 mg via INTRAVENOUS

## 2015-12-28 MED ORDER — SODIUM CHLORIDE 0.9 % IV SOLN: Freq: Once | INTRAVENOUS | Status: AC

## 2015-12-28 MED ORDER — PANTOPRAZOLE SODIUM 40 MG PO TBEC
40.0000 mg | DELAYED_RELEASE_TABLET | Freq: Two times a day (BID) | ORAL | Status: DC
  Administered 2015-12-28 – 2015-12-31 (×6): 40 mg via ORAL
  Filled 2015-12-28 (×6): qty 1

## 2015-12-28 MED ORDER — MAGNESIUM SULFATE 2 GM/50ML IV SOLN
2.0000 g | Freq: Once | INTRAVENOUS | Status: AC
  Administered 2015-12-28: 2 g via INTRAVENOUS
  Filled 2015-12-28: qty 50

## 2015-12-28 MED ORDER — NADOLOL 40 MG PO TABS
40.0000 mg | ORAL_TABLET | Freq: Two times a day (BID) | ORAL | Status: DC
  Administered 2015-12-28 – 2015-12-31 (×6): 40 mg via ORAL
  Filled 2015-12-28 (×10): qty 1

## 2015-12-28 MED ORDER — PROPOFOL 10 MG/ML IV BOLUS
INTRAVENOUS | Status: AC
  Filled 2015-12-28: qty 20

## 2015-12-28 MED ORDER — LACTULOSE 10 GM/15ML PO SOLN
10.0000 g | Freq: Three times a day (TID) | ORAL | Status: DC
  Administered 2015-12-28 – 2015-12-29 (×3): 10 g via ORAL
  Filled 2015-12-28 (×3): qty 30

## 2015-12-28 MED ORDER — MIDAZOLAM HCL 2 MG/2ML IJ SOLN
INTRAMUSCULAR | Status: AC
  Filled 2015-12-28: qty 2

## 2015-12-28 MED ORDER — LIDOCAINE HCL (PF) 1 % IJ SOLN
INTRAMUSCULAR | Status: AC
  Filled 2015-12-28: qty 5

## 2015-12-28 MED ORDER — LIDOCAINE VISCOUS 2 % MT SOLN
15.0000 mL | Freq: Once | OROMUCOSAL | Status: DC
Start: 2015-12-28 — End: 2015-12-28
  Administered 2015-12-28: 6 mL via OROMUCOSAL

## 2015-12-28 MED ORDER — FENTANYL CITRATE (PF) 100 MCG/2ML IJ SOLN
25.0000 ug | INTRAMUSCULAR | Status: DC | PRN
  Administered 2015-12-28: 25 ug via INTRAVENOUS

## 2015-12-28 MED ORDER — LIDOCAINE VISCOUS 2 % MT SOLN
OROMUCOSAL | Status: AC
  Filled 2015-12-28: qty 15

## 2015-12-28 MED ORDER — PREDNISOLONE SODIUM PHOSPHATE 15 MG/5ML PO SOLN
40.0000 mg | Freq: Every day | ORAL | Status: DC
  Administered 2015-12-28 – 2015-12-31 (×4): 40 mg via ORAL
  Filled 2015-12-28 (×8): qty 15

## 2015-12-28 MED ORDER — FENTANYL CITRATE (PF) 100 MCG/2ML IJ SOLN: 25.0000 ug | INTRAMUSCULAR | Status: DC | PRN

## 2015-12-28 MED ORDER — SODIUM CHLORIDE 0.9% FLUSH
INTRAVENOUS | Status: AC
  Filled 2015-12-28: qty 10

## 2015-12-28 MED ORDER — PROPOFOL 10 MG/ML IV BOLUS
INTRAVENOUS | Status: AC
  Filled 2015-12-28: qty 40

## 2015-12-28 MED ORDER — HYDROCORTISONE ACETATE 25 MG RE SUPP
25.0000 mg | Freq: Two times a day (BID) | RECTAL | Status: DC
  Administered 2015-12-28 – 2015-12-29 (×4): 25 mg via RECTAL
  Filled 2015-12-28 (×6): qty 1

## 2015-12-28 MED ORDER — PROPOFOL 500 MG/50ML IV EMUL
INTRAVENOUS | Status: DC | PRN
  Administered 2015-12-28: 100 ug/kg/min via INTRAVENOUS
  Administered 2015-12-28: 50 ug/kg/min via INTRAVENOUS

## 2015-12-28 MED ORDER — FENTANYL CITRATE (PF) 100 MCG/2ML IJ SOLN
INTRAMUSCULAR | Status: AC
  Filled 2015-12-28: qty 2

## 2015-12-28 MED ORDER — MIDAZOLAM HCL 2 MG/2ML IJ SOLN
1.0000 mg | INTRAMUSCULAR | Status: DC | PRN
Start: 2015-12-28 — End: 2015-12-28
  Administered 2015-12-28: 2 mg via INTRAVENOUS

## 2015-12-28 MED ORDER — SODIUM CHLORIDE 0.9% FLUSH
10.0000 mL | INTRAVENOUS | Status: DC | PRN
  Administered 2015-12-30: 20 mL
  Filled 2015-12-28: qty 40

## 2015-12-28 MED ORDER — LIDOCAINE HCL (CARDIAC) 10 MG/ML IV SOLN
INTRAVENOUS | Status: DC | PRN
  Administered 2015-12-28: 50 mg via INTRAVENOUS

## 2015-12-28 MED ORDER — ONDANSETRON HCL 4 MG/2ML IJ SOLN: 4.0000 mg | Freq: Once | INTRAMUSCULAR | Status: DC | PRN

## 2015-12-28 NOTE — Interval H&P Note (Signed)
History and Physical Interval Note:  12/28/2015 11:03 AM  Antonio [name]  has presented today for surgery, with the diagnosis of Anemia, melena, history of cirrhosis  The various methods of treatment have been discussed with the patient and family. After consideration of risks, benefits and other options for treatment, the patient has consented to  Procedure(s): ESOPHAGOGASTRODUODENOSCOPY (EGD) WITH PROPOFOL ([name]) as a surgical intervention .  The patient's history has been reviewed, patient examined, no change in status, stable for surgery.  I have reviewed the patient's chart and labs.  Questions were answered to the patient's satisfaction.     [employer]

## 2015-12-28 NOTE — Progress Notes (Signed)
Late entry: INR ordered at 0813 to be stat. Per lab, unable to draw until an hour after blood completed to ensure an accurate result (patient was receiving a final unit), which would be 0840. INR will be drawn at 0940. Procedure tentatively scheduled for 1135. Patient had jello and broth at 0800. Discussed NPO status with patient and nurse. Called anesthesia and spoke with Helene Kelp, CRNA and Dr. Patsey Berthold, who are aware that patient had broth and jello. This will not postpone case. Will proceed as planned.   Orvil Feil, ANP-BC [facility]

## 2015-12-28 NOTE — Anesthesia Procedure Notes (Signed)
Procedure Name: MAC Date/Time: 12/28/2015 11:50 AM Performed by: Vista Deck Pre-anesthesia Checklist: Patient identified, Emergency Drugs available, Suction available, Timeout performed and Patient being monitored Patient Re-evaluated:Patient Re-evaluated prior to inductionOxygen Delivery Method: Non-rebreather mask

## 2015-12-28 NOTE — Progress Notes (Signed)
[facility] PROGRESS NOTE  Chadwich Mariscal [id number] DOB: Feb 21, 1971    PCP:   Deloria Lair, MD   HPI: Antonio [name] is an 45 y.o. male with hx of alcoholic cirrhosis, still drinking, admitted for GI Bleed.  He was originally started on IV octrotide, and was given transfusion of 4 units of PRBC, with eventual Hb rise from 5.8 g per dL to 7.7.  His INR is elevated, not corrected with Vit K.  He also has elevated NH3 level to 113.  He was seen in consultation with GI, and had EGD showing non bleeding varices.  His octreotide was discontinued, and he was continued on steroid and PPI, with thought to have GI follow up for colonoscopy.   Rewiew of Systems:  Constitutional: Negative for malaise, fever and chills. No significant weight loss or weight gain Eyes: Negative for eye pain, redness and discharge, diplopia, visual changes, or flashes of light. ENMT: Negative for ear pain, hoarseness, nasal congestion, sinus pressure and sore throat. No headaches; tinnitus, drooling, or problem swallowing. Cardiovascular: Negative for chest pain, palpitations, diaphoresis, dyspnea and peripheral edema. ; No orthopnea, PND Respiratory: Negative for cough, hemoptysis, wheezing and stridor. No pleuritic chestpain. Gastrointestinal: Negative for nausea, vomiting, diarrhea, constipation, abdominal pain, melena, blood in stool, hematemesis, jaundice and rectal bleeding.    Genitourinary: Negative for frequency, dysuria, incontinence,flank pain and hematuria; Musculoskeletal: Negative for back pain and neck pain. Negative for swelling and trauma.;  Skin: . Negative for pruritus, rash, abrasions, bruising and skin lesion.; ulcerations Neuro: Negative for headache, lightheadedness and neck stiffness. Negative for weakness, altered level of consciousness , altered mental status, extremity weakness, burning feet, involuntary movement, seizure and syncope.  Psych: negative for anxiety, depression, insomnia,  tearfulness, panic attacks, hallucinations, paranoia, suicidal or homicidal ideation    Past Medical History:  Diagnosis Date  . Alcohol abuse   . Alcoholic hepatitis   . Cirrhosis with alcoholism [facility])     Past Surgical History:  Procedure Laterality Date  . Colonoscopy     per patient around 2014 in [place]     Medications:  HOME MEDS: Prior to Admission medications   Medication Sig Start Date End Date Taking? Authorizing Provider  Calcium Carb-Cholecalciferol (CALCIUM 600+D) 600-800 MG-UNIT TABS Take 1 tablet by mouth daily.   Yes Historical Provider, MD  furosemide (LASIX) 20 MG tablet Take 20 mg by mouth daily as needed for fluid.   Yes Historical Provider, MD  potassium chloride SA (K-DUR,KLOR-CON) 20 MEQ tablet Take 20 mEq by mouth 2 (two) times daily.   Yes Historical Provider, MD  spironolactone (ALDACTONE) 100 MG tablet Take 100 mg by mouth daily.   Yes Historical Provider, MD     Allergies:  No Known Allergies  Social History:   reports that he has been smoking Cigarettes.  He has been smoking about 0.25 packs per day. He has never used smokeless tobacco. He reports that he drinks about 3.6 oz of alcohol per week . He reports that he does not use drugs.  Family History: Family History  Problem Relation Age of Onset  . Colon cancer Neg Hx   . Liver disease Neg Hx      Physical Exam: Vitals:   12/28/15 0512 12/28/15 0750 12/28/15 0800 12/28/15 0900  BP: 109/67  122/72 127/81  Pulse: 93   88  Resp: 18  20 (!) 22  Temp: 98.1 F (36.7 C) 97.5 F (36.4 C)    TempSrc: Oral Oral    SpO2:  100%     Weight:      Height:       Blood pressure 127/81, pulse 88, temperature 97.5 F (36.4 C), temperature source Oral, resp. rate (!) 22, height 6\' 2"  (1.88 m), weight (!) 162.9 kg (359 lb 2.1 oz), SpO2 100 %.  GEN:  Pleasant patient lying in the stretcher in no acute distress; cooperative with exam. PSYCH:  alert and oriented x4; does not appear anxious or depressed;  affect is appropriate. HEENT: Mucous membranes pink and anicteric; PERRLA; EOM intact; no cervical lymphadenopathy nor thyromegaly or carotid bruit; no JVD; There were no stridor. Neck is very supple. Breasts:: Not examined CHEST WALL: No tenderness CHEST: Normal respiration, clear to auscultation bilaterally.  HEART: Regular rate and rhythm.  There are no murmur, rub, or gallops.   BACK: No kyphosis or scoliosis; no CVA tenderness ABDOMEN: soft and non-tender; no masses, no organomegaly, normal abdominal bowel sounds; no pannus; no intertriginous candida. There is no rebound and no distention. Rectal Exam: Not done EXTREMITIES: No bone or joint deformity; age-appropriate arthropathy of the hands and knees; no edema; no ulcerations.  There is no calf tenderness. Genitalia: not examined PULSES: 2+ and symmetric SKIN: Normal hydration no rash or ulceration CNS: Cranial nerves 2-12 grossly intact no focal lateralizing neurologic deficit.  Speech is fluent; uvula elevated with phonation, facial symmetry and tongue midline. DTR are normal bilaterally, cerebella exam is intact, barbinski is negative and strengths are equaled bilaterally.  No sensory loss.   Labs on Admission:  Basic Metabolic Panel:  Recent Labs Lab 12/26/15 2230 12/27/15 0345 12/28/15 0307  NA 127* 130* 126*  K 3.9 4.1 4.6  CL 105 102 106  CO2 15* 16* 21*  GLUCOSE 102* 107* 128*  BUN 16 17 18   CREATININE 1.60* 1.72* 1.46*  CALCIUM 7.4* 7.6* 7.3*  MG 1.2*  --   --    Liver Function Tests:  Recent Labs Lab 12/26/15 2230 12/27/15 0345 12/28/15 0307  AST 85* 85* 74*  ALT 34 34 32  ALKPHOS 122 115 108  BILITOT 5.8* 5.6* 6.0*  PROT 6.4* 6.4* 6.0*  ALBUMIN 1.5* 1.5* 1.5*   CBC:  Recent Labs Lab 12/26/15 2230 12/27/15 0345 12/27/15 0951 12/27/15 1454 12/28/15 0307  WBC 10.1 10.6*  --   --  8.9  NEUTROABS 6.8  --   --   --   --   HGB 5.8* 6.0* 6.8* 6.8* 6.8*  HCT 17.9* 18.8* 21.2* 20.4* 20.2*  MCV 104.1*  102.7*  --   --  94.8  PLT 131* 120*  --   --  93*   Cardiac Enzymes:  Recent Labs Lab 12/26/15 2230 12/27/15 0345 12/27/15 0951 12/27/15 1454  TROPONINI 0.05* 0.04* 0.04* 0.04*    Radiological Exams on Admission: Dg Chest 2 View  Result Date: 12/27/2015 CLINICAL DATA:  Intermittent shortness of breath, bilateral lower extremity swelling for 1-1/2 weeks. History of liver disease. EXAM: CHEST  2 VIEW COMPARISON:  None. FINDINGS: Cardiomediastinal silhouette is unremarkable for this low inspiratory examination with crowded vasculature markings. The lungs are clear without pleural effusions or focal consolidations. Trachea projects midline and there is no pneumothorax. Included soft tissue planes and osseous structures are non-suspicious. Abdominal distention noted on the lateral radiograph. IMPRESSION: No active cardiopulmonary disease. Electronically Signed   By: Elon Alas M.D.   On: 12/27/2015 00:07    EKG: Independently reviewed.  Assessment/Plan:  Liver cirrhosis:  He is still drinking.  Continue with steroid  as per GI with outpatient taper.    GI Bleed:  No evidence of bleeding.  Will continue with PPI.  Octreotide has been stopped.  Will check another H and H in the am.  If stable, will d/c home.   Hypomagnesemia:  Have given IV supplement today.    Elevated NH3:  Will start [place] today as well.    Other plans as per orders. Code Status:FULL CODE.    Orvan Falconer, MD.  FACP [facility] Pager [phone] 7pm to 7am.  12/28/2015, 9:27 AM

## 2015-12-28 NOTE — H&P (View-Only) (Signed)
Referring Provider: Dr. Darrick Meigs  Primary Care Physician:  Deloria Lair, MD Primary Gastroenterologist:  Dr. Halford Chessman at [facility], Dr. Oneida Alar during admission at [facility]   Date of Admission: 12/27/15 Date of Consultation: 12/27/15  Reason for Consultation:  Melena, cirrhosis  HPI:  Antonio [name] is a 45 y.o. year old male with a history of ETOH hepatitis, ETOH cirrhosis, seen by [facility] in Jan 2016 (Dr. Eddie Dibbles Schmeltzer) in evaluation. At that time, Hep C antibody negative, advised he was not a transplant candidate due to ETOH abuse at the time but was counseled that he would be followed. Variceal screening was recommended but never completed, as he was lost to follow-up and never showed for subsequent visits. He was then admitted to [facility] late Nov 2016 with worsening leg edema, ascites, shortness of breath in the setting of continued ETOH use. MELD at that time 22. No evidence of SBP or suspicion for SBP. He was discharged with Lasix 40 mg daily and Aldactone 100 mg daily. US abdomen last completed Nov 2016 without [place]. Last paracentesis Jan 2016 with 2.1 liters removed. Hgb on discharge in Nov 2016 in the 13 range.  States he noted onset of black, tarry stool a few days ago. No hematemesis. No N/V. No abdominal pain. Belly felt tight, felt short of breath, legs swelling, which prompted seeking medical attention. Denies NSAIDs or aspirin powders. No mental status changes or confusion. No fever or chills. No fatigue or weakness. No prior EGD. Thinks he had a colonoscopy about 3 years ago in [place]. Drinks about 4-5 beers a day and 1/2 pint of liquor daily. States he has been told before that if he keeps drinking he will die. He thinks he may be willing to seek help to stop. Last saw black stool yesterday.   As an outpatient takes Lasix 20 mg daily as needed and aldactone 100 mg daily.    Past Medical History:  Diagnosis Date  . Alcohol abuse   . Alcoholic hepatitis   . Cirrhosis with alcoholism [facility])      Past Surgical History:  Procedure Laterality Date  . Colonoscopy     per patient around 2014 in [place]     Prior to Admission medications   Medication Sig Start Date End Date Taking? Authorizing Provider  Calcium Carb-Cholecalciferol (CALCIUM 600+D) 600-800 MG-UNIT TABS Take 1 tablet by mouth daily.   Yes Historical Provider, MD  furosemide (LASIX) 20 MG tablet Take 20 mg by mouth daily as needed for fluid.   Yes Historical Provider, MD  potassium chloride SA (K-DUR,KLOR-CON) 20 MEQ tablet Take 20 mEq by mouth 2 (two) times daily.   Yes Historical Provider, MD  spironolactone (ALDACTONE) 100 MG tablet Take 100 mg by mouth daily.   Yes Historical Provider, MD    Current Facility-Administered Medications  Medication Dose Route Frequency Provider Last Rate Last Dose  . 0.9 %  sodium chloride infusion   Intravenous Once Rolland Porter, MD      . folic acid (FOLVITE) tablet 1 mg  1 mg Oral Daily Oswald Hillock, MD      . LORazepam (ATIVAN) tablet 1 mg  1 mg Oral Q6H PRN Oswald Hillock, MD       Or  . LORazepam (ATIVAN) injection 1 mg  1 mg Intravenous Q6H PRN Oswald Hillock, MD      . multivitamin with minerals tablet 1 tablet  1 tablet Oral Daily Oswald Hillock, MD      .  thiamine (VITAMIN B-1) tablet 100 mg  100 mg Oral Daily Oswald Hillock, MD       Or  . thiamine (B-1) injection 100 mg  100 mg Intravenous Daily Oswald Hillock, MD        Allergies as of 12/26/2015  . (No Known Allergies)    Family History  Problem Relation Age of Onset  . Colon cancer Neg Hx   . Liver disease Neg Hx     Social History   Social History  . Marital status: Married    Spouse name: [name]  . Number of children: [name]  . Years of education: [name]   Occupational History  . Not on file.   Social History Main Topics  . Smoking status: Current Some Day Smoker    Packs/day: 0.25    Types: Cigarettes  . Smokeless tobacco: Never Used  . Alcohol use 3.6 oz/week    6 Cans of beer per week     Comment: drinks 4-5  beers a day and 1/2 pint of liquor daily   . Drug use: No  . Sexual activity: Yes   Other Topics Concern  . Not on file   Social History Narrative  . No narrative on file    Review of Systems: As noted in HPI   Physical Exam: Vital signs in last 24 hours: Temp:  [98.1 F (36.7 C)-98.5 F (36.9 C)] 98.1 F (36.7 C) (08/01 0824) Pulse Rate:  [103-112] 110 (08/01 0655) Resp:  [14-25] 22 (08/01 0655) BP: (103-132)/(54-74) 132/66 (08/01 0700) SpO2:  [100 %] 100 % (08/01 0630) Weight:  [225 lb (102.1 kg)-350 lb 1.5 oz (158.8 kg)] 350 lb 1.5 oz (158.8 kg) (08/01 0241) Last BM Date: 12/26/15 General:   Alert, well-developed, appears weak, older than stated age Head:  Normocephalic and atraumatic. Eyes:  Sclera clear, mild icterus  Ears:  Normal auditory acuity. Nose:  No deformity, discharge,  or lesions. Mouth:  No deformity or lesions, dentition normal. Lungs:  Clear throughout to auscultation.  Heart:  Regular rate and rhythm Abdomen:  Soft, obese, no TTP, no rebound or guarding, generalized anasarca,  Rectal:  Deferred until time of colonoscopy.   Msk:  Symmetrical without gross deformities. Normal posture. Extremities:  2-3+ pitting lower extremity edema, pedal edema, anasarca  Neurologic:  Alert and  oriented x4; negative asterixis  Skin:  Intact without significant lesions or rashes. Psych:  Alert and cooperative. Flat affect   Intake/Output from previous day: 07/31 0701 - 08/01 0700 In: 306 [Blood:306] Out: -  Intake/Output this shift: Total I/O In: 120 [P.O.:120] Out: -   Lab Results:  Recent Labs  12/26/15 2230 12/27/15 0345  WBC 10.1 10.6*  HGB 5.8* 6.0*  HCT 17.9* 18.8*  PLT 131* 120*   BMET  Recent Labs  12/26/15 2230 12/27/15 0345  NA 127* 130*  K 3.9 4.1  CL 105 102  CO2 15* 16*  GLUCOSE 102* 107*  BUN 16 17  CREATININE 1.60* 1.72*  CALCIUM 7.4* 7.6*   LFT  Recent Labs  12/26/15 2230 12/27/15 0345  PROT 6.4* 6.4*  ALBUMIN 1.5*  1.5*  AST 85* 85*  ALT 34 34  ALKPHOS 122 115  BILITOT 5.8* 5.6*   PT/INR  Recent Labs  12/26/15 2230  LABPROT 28.5*  INR 2.62    Studies/Results: Dg Chest 2 View  Result Date: 12/27/2015 CLINICAL DATA:  Intermittent shortness of breath, bilateral lower extremity swelling for 1-1/2 weeks. History of liver disease.  EXAM: CHEST  2 VIEW COMPARISON:  None. FINDINGS: Cardiomediastinal silhouette is unremarkable for this low inspiratory examination with crowded vasculature markings. The lungs are clear without pleural effusions or focal consolidations. Trachea projects midline and there is no pneumothorax. Included soft tissue planes and osseous structures are non-suspicious. Abdominal distention noted on the lateral radiograph. IMPRESSION: No active cardiopulmonary disease. Electronically Signed   By: Elon Alas M.D.   On: 12/27/2015 00:07    Impression: 45 year old male with history of ETOH cirrhosis, ETOH hepatitis, ETOH abuse, presenting with decompensated cirrhosis, acute blood loss anemia, melena. Receiving 2/2 units PRBCs at time of consultation. Has continued to drink alcohol daily despite recommendations for abstinence, with poor prognosis. Current discriminant function 66-84, MELD 28 on admission. He has already eaten breakfast this morning; he has been made NPO and will plan on EGD on 8/2 with Propofol. In interim, will need close monitoring, serial monitoring of H/H, transfusion as necessary. Antibiotic prophylaxis (Rocephin) once every 24 hours started as he has a known GI bleed in the setting of cirrhosis. Will discuss prednisolone therapy with Dr. Oneida Alar but will address acute blood loss anemia first. Further addressing of anasarca, ascites after EGD. Patient's overall prognosis is poor with his current behavior and clinical findings.    Plan: Start PPI infusion Rocephin IV every 24 hours NPO Follow H/H Transfuse as necessary EGD with Propofol on 8/2 by Dr. Oneida Alar   Further diuretic management after EGD completed.      LOS: 0 days    12/27/2015, 8:51 AM

## 2015-12-28 NOTE — Anesthesia Preprocedure Evaluation (Signed)
Anesthesia Evaluation  Patient identified by MRN, date of birth, ID band Patient awake    Reviewed: Allergy & Precautions, NPO status , Patient's Chart, lab work & pertinent test results  Airway Mallampati: II  TM Distance: >3 FB     Dental  (+) Teeth Intact   Pulmonary shortness of breath, Current Smoker,    breath sounds clear to auscultation       Cardiovascular + DOE   Rhythm:Regular Rate:Normal     Neuro/Psych    GI/Hepatic (+) Cirrhosis   ascites    , Hepatitis -  Endo/Other  Morbid obesity  Renal/GU      Musculoskeletal   Abdominal   Peds  Hematology  (+) anemia ,   Anesthesia Other Findings   Reproductive/Obstetrics                             Anesthesia Physical Anesthesia Plan  ASA: III  Anesthesia Plan: MAC   Post-op Pain Management:    Induction: Intravenous  Airway Management Planned: Simple Face Mask  Additional Equipment:   Intra-op Plan:   Post-operative Plan:   Informed Consent: I have reviewed the patients History and Physical, chart, labs and discussed the procedure including the risks, benefits and alternatives for the proposed anesthesia with the patient or authorized representative who has indicated his/her understanding and acceptance.     Plan Discussed with:   Anesthesia Plan Comments:         Anesthesia Quick Evaluation

## 2015-12-28 NOTE — Transfer of Care (Signed)
Immediate Anesthesia Transfer of Care Note  Patient: Antonio [name]  Procedure(s) Performed: Procedure(s): ESOPHAGOGASTRODUODENOSCOPY (EGD) WITH PROPOFOL ([name])  Patient Location: PACU  Anesthesia Type:MAC  Level of Consciousness: awake and patient cooperative  Airway & Oxygen Therapy: Patient Spontanous Breathing and non-rebreather face mask  Post-op Assessment: Report given to RN and Post -op Vital signs reviewed and stable  Post vital signs: Reviewed and stable  Last Vitals:  Vitals:   12/28/15 1145 12/28/15 1224  BP: 128/83   Pulse:    Resp: 15 20  Temp:  36.5 C    Last Pain:  Vitals:   12/28/15 1000  TempSrc:   PainSc: 0-No pain         Complications: No apparent anesthesia complications

## 2015-12-28 NOTE — Op Note (Signed)
[facility] Patient Name: Antonio [name] Procedure Date: 12/28/2015 11:52 AM MRN: [id number] Date of Birth: December 01, 1970 Attending MD: [name] , MD CSN: [id number] Age: 45 Admit Type: Inpatient Procedure:                Upper GI endoscopy, DIAGNOSTIC Indications:              Hematochezia, Melena POSSIBLY IN PT WITH PLT CT                            93K, INR 2.4-2.6, & CHILD PUGH C. Providers:                [name], MD, Janeece Riggers, RN, Randa Spike,                            Technician Referring MD:             [name], MD Medicines:                Propofol per Anesthesia Complications:            No immediate complications. Estimated Blood Loss:     Estimated blood loss: none. Procedure:                Pre-Anesthesia Assessment:                           - Prior to the procedure, a History and Physical                            was performed, and patient medications and                            allergies were reviewed. The patient's tolerance of                            previous anesthesia was also reviewed. The risks                            and benefits of the procedure and the sedation                            options and risks were discussed with the patient.                            All questions were answered, and informed consent                            was obtained. Prior Anticoagulants: The patient has                            taken anticoagulant medication, last dose was day                            of procedure. ASA Grade Assessment: III - A patient  with severe systemic disease. After reviewing the                            risks and benefits, the patient was deemed in                            satisfactory condition to undergo the procedure.                            After obtaining informed consent, the endoscope was                            passed under direct vision. Throughout the               procedure, the patient's blood pressure, pulse, and                            oxygen saturations were monitored continuously. The                            [id number] scope was introduced through the                            mouth, and advanced to the second part of duodenum.                            The upper GI endoscopy was accomplished without                            difficulty. The patient tolerated the procedure                            well. Scope In: 12:12:13 PM Scope Out: 12:16:51 PM Total Procedure Duration: 0 hours 4 minutes 38 seconds  Findings:      Four columns of non-bleeding grade I(3), grade II(1) varices were found       in the lower third of the esophagus,. No stigmata of recent bleeding       were evident and no red wale signs were present.      Mild portal hypertensive gastropathy was found in the entire examined       stomach.      Patchy mildly congested mucosa without active bleeding and with no       stigmata of bleeding was found in the entire duodenum. NO GASTRIC OR       DUODENAL VARICES. NO OLD BLOOD OR FRESH BLOOD IN ESOPHAGUS, STOMACH, OR       DUODENUM. 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 NO SOURCE FOR ANEMIA IDENTIFIED.                           - MILD Portal hypertensive gastropathy.                           -  Congested duodenal mucosa.                           - RECTAL BLEEDING PRESUMED TO BE FROM LGI TRACT AND                            DUE TO HEMORRHOIDS Moderate Sedation:      Per Anesthesia Care Recommendation:           - Return patient to hospital ward. D/C PROTONIX &                            OCTREOTIDE GTTs.                           - Low sodium diet.                           - Use Protonix (pantoprazole) 40 mg PO BID. AD                            ANUSOL HC BID FOR 14 DAYS.                           - HAD A LONG TALK WITH PTS MOTHER.  SHE IS AWARE OF                            PT'S GUARDED PROGNOSIS.                           - CONTINUE PREDNISOLONE AND WILL TAPER AS AN                            OUTPATIENT. CONSIDER ADDING NONSELECTIVE BB ON                            DISCHARGE FOR PRIMARY VARICEAL PROPHYLAXIS.                           - WOULD NOT PERFORM A COLONOSCOPY DUE TO HIS HAVING                            DECOMPENSATED LIVER DISEASE WITH AN INR > 2.4.                            COULD RE-CONSIDER IN 30 DAYS. AT THIS TIME RISK OF                            BLEEDING OUTWEIGH BENEFIST OF PROCEDURES. PRE-TEST                            PROBABILITY THAT WE COULD SUCCUSSEFULLY INTERVENE                            ON A LESION IN THE  COLON IS LOW.                           - Repeat upper endoscopy in 3 months for                            surveillance.                           - Return to my office in 6 weeks.                           - Medication reconciliation was performed, and a                            list of the patient's discharge medications was                            provided to the patient. Procedure Code(s):        --- Professional ---                           [phone], Esophagogastroduodenoscopy, flexible,                            transoral; diagnostic, including collection of                            specimen(s) by brushing or washing, when performed                            (separate procedure) Diagnosis Code(s):        --- Professional ---                           I85.00, Esophageal varices without bleeding                           K76.6, Portal hypertension                           K31.89, Other diseases of stomach and duodenum                           K92.1, Melena (includes Hematochezia) CPT copyright 2016 American Medical Association. All rights reserved. The codes documented in this report are preliminary and upon coder review may  be revised to meet current compliance  requirements. [name], MD [name], MD 12/28/2015 3:17:40 PM This report has been signed electronically. Number of Addenda: 0

## 2015-12-28 NOTE — Anesthesia Postprocedure Evaluation (Signed)
Anesthesia Post Note  Patient: Antonio [name]  Procedure(s) Performed: Procedure(s) (LRB): ESOPHAGOGASTRODUODENOSCOPY (EGD) WITH PROPOFOL ([name])  Patient location during evaluation: PACU Anesthesia Type: MAC Level of consciousness: awake Pain management: pain level controlled Vital Signs Assessment: post-procedure vital signs reviewed and stable Respiratory status: spontaneous breathing and non-rebreather facemask Cardiovascular status: stable Anesthetic complications: no    Last Vitals:  Vitals:   12/28/15 1145 12/28/15 1224  BP: 128/83   Pulse:    Resp: 15 20  Temp:  36.5 C    Last Pain:  Vitals:   12/28/15 1000  TempSrc:   PainSc: 0-No pain                 [name]

## 2015-12-29 DIAGNOSIS — R0609 Other forms of dyspnea: Secondary | ICD-10-CM

## 2015-12-29 DIAGNOSIS — K7030 Alcoholic cirrhosis of liver without ascites: Secondary | ICD-10-CM

## 2015-12-29 DIAGNOSIS — F1010 Alcohol abuse, uncomplicated: Secondary | ICD-10-CM

## 2015-12-29 DIAGNOSIS — R609 Edema, unspecified: Secondary | ICD-10-CM

## 2015-12-29 DIAGNOSIS — K7031 Alcoholic cirrhosis of liver with ascites: Secondary | ICD-10-CM

## 2015-12-29 LAB — HEMOGLOBIN AND HEMATOCRIT, BLOOD
HCT: 24.2 % — ABNORMAL LOW (ref 39.0–52.0)
HEMOGLOBIN: 8 g/dL — AB (ref 13.0–17.0)

## 2015-12-29 MED ORDER — CIPROFLOXACIN HCL 250 MG PO TABS
500.0000 mg | ORAL_TABLET | Freq: Two times a day (BID) | ORAL | Status: DC
  Administered 2015-12-29 – 2015-12-31 (×4): 500 mg via ORAL
  Filled 2015-12-29 (×4): qty 2

## 2015-12-29 MED ORDER — LACTULOSE 10 GM/15ML PO SOLN
10.0000 g | Freq: Three times a day (TID) | ORAL | Status: DC | PRN
  Filled 2015-12-29: qty 30

## 2015-12-29 MED ORDER — RIFAXIMIN 550 MG PO TABS
550.0000 mg | ORAL_TABLET | Freq: Two times a day (BID) | ORAL | Status: DC
  Administered 2015-12-29 – 2015-12-31 (×5): 550 mg via ORAL
  Filled 2015-12-29 (×5): qty 1

## 2015-12-29 NOTE — Progress Notes (Signed)
[facility] PROGRESS NOTE  Antonio [name] [id number] DOB: 1970-08-10    PCP:   Deloria Lair, MD    Antonio [name] is an 45 y.o. male with hx of alcoholic cirrhosis, still drinking, admitted for GI Bleed.  He was originally started on IV octrotide, and was given transfusion of 4 units of PRBC, with eventual Hb rise from 5.8 g per dL to 7.7.  His INR is elevated, not corrected with Vit K.  He also has elevated NH3 level to 113.  He was seen in consultation with GI, and had EGD showing non bleeding varices.  His octreotide was discontinued, and he was continued on steroid and PPI, with thought to have GI follow up for colonoscopy. He has no further bleeding.  He was started on Nadalol and Lactulose with Xafaxan.    Rewiew of Systems:  Constitutional: Negative for malaise, fever and chills. No significant weight loss or weight gain Eyes: Negative for eye pain, redness and discharge, diplopia, visual changes, or flashes of light. ENMT: Negative for ear pain, hoarseness, nasal congestion, sinus pressure and sore throat. No headaches; tinnitus, drooling, or problem swallowing. Cardiovascular: Negative for chest pain, palpitations, diaphoresis, dyspnea and peripheral edema. ; No orthopnea, PND Respiratory: Negative for cough, hemoptysis, wheezing and stridor. No pleuritic chestpain. Gastrointestinal: Negative for nausea, vomiting, diarrhea, constipation, abdominal pain, melena, blood in stool, hematemesis, jaundice and rectal bleeding.    Genitourinary: Negative for frequency, dysuria, incontinence,flank pain and hematuria; Musculoskeletal: Negative for back pain and neck pain. Negative for swelling and trauma.;  Skin: . Negative for pruritus, rash, abrasions, bruising and skin lesion.; ulcerations Neuro: Negative for headache, lightheadedness and neck stiffness. Negative for weakness, altered level of consciousness , altered mental status, extremity weakness, burning feet, involuntary movement,  seizure and syncope.  Psych: negative for anxiety, depression, insomnia, tearfulness, panic attacks, hallucinations, paranoia, suicidal or homicidal ideation    Past Medical History:  Diagnosis Date  . Alcohol abuse   . Alcoholic hepatitis   . Cirrhosis with alcoholism [facility])     Past Surgical History:  Procedure Laterality Date  . Colonoscopy     per patient around 2014 in [place]     Medications:  HOME MEDS: Prior to Admission medications   Medication Sig Start Date End Date Taking? Authorizing Provider  Calcium Carb-Cholecalciferol (CALCIUM 600+D) 600-800 MG-UNIT TABS Take 1 tablet by mouth daily.   Yes Historical Provider, MD  furosemide (LASIX) 20 MG tablet Take 20 mg by mouth daily as needed for fluid.   Yes Historical Provider, MD  potassium chloride SA (K-DUR,KLOR-CON) 20 MEQ tablet Take 20 mEq by mouth 2 (two) times daily.   Yes Historical Provider, MD  spironolactone (ALDACTONE) 100 MG tablet Take 100 mg by mouth daily.   Yes Historical Provider, MD     Allergies:  No Known Allergies  Social History:   reports that he has been smoking Cigarettes.  He has been smoking about 0.25 packs per day. He has never used smokeless tobacco. He reports that he drinks about 3.6 oz of alcohol per week . He reports that he does not use drugs.  Family History: Family History  Problem Relation Age of Onset  . Colon cancer Neg Hx   . Liver disease Neg Hx      Physical Exam: Vitals:   12/28/15 2103 12/28/15 2310 12/29/15 0627 12/29/15 1436  BP: 130/75  128/77 114/69  Pulse: 95  88 74  Resp: 20  20 18   Temp: 97.8 F (36.6  C)  98.2 F (36.8 C) 98.2 F (36.8 C)  TempSrc: Oral  Oral Oral  SpO2: 100% 100% 100% 100%  Weight:      Height:       Blood pressure 114/69, pulse 74, temperature 98.2 F (36.8 C), temperature source Oral, resp. rate 18, height 6\' 2"  (1.88 m), weight (!) 162.9 kg (359 lb 2.1 oz), SpO2 100 %.  GEN:  Pleasant patient lying in the stretcher in no acute  distress; cooperative with exam. PSYCH:  alert and oriented x4; does not appear anxious or depressed; affect is appropriate. HEENT: Mucous membranes pink and anicteric; PERRLA; EOM intact; no cervical lymphadenopathy nor thyromegaly or carotid bruit; no JVD; There were no stridor. Neck is very supple. Breasts:: Not examined CHEST WALL: No tenderness CHEST: Normal respiration, clear to auscultation bilaterally.  HEART: Regular rate and rhythm.  There are no murmur, rub, or gallops.   BACK: No kyphosis or scoliosis; no CVA tenderness ABDOMEN: soft and non-tender; no masses, no organomegaly, normal abdominal bowel sounds; no pannus; no intertriginous candida. There is no rebound and no distention. Rectal Exam: Not done EXTREMITIES: No bone or joint deformity; age-appropriate arthropathy of the hands and knees; no edema; no ulcerations.  There is no calf tenderness. Genitalia: not examined PULSES: 2+ and symmetric SKIN: Normal hydration no rash or ulceration CNS: Cranial nerves 2-12 grossly intact no focal lateralizing neurologic deficit.  Speech is fluent; uvula elevated with phonation, facial symmetry and tongue midline. DTR are normal bilaterally, cerebella exam is intact, barbinski is negative and strengths are equaled bilaterally.  No sensory loss.   Labs on Admission:  Basic Metabolic Panel:  Recent Labs Lab 12/26/15 2230 12/27/15 0345 12/28/15 0307  NA 127* 130* 126*  K 3.9 4.1 4.6  CL 105 102 106  CO2 15* 16* 21*  GLUCOSE 102* 107* 128*  BUN 16 17 18   CREATININE 1.60* 1.72* 1.46*  CALCIUM 7.4* 7.6* 7.3*  MG 1.2*  --   --    Liver Function Tests:  Recent Labs Lab 12/26/15 2230 12/27/15 0345 12/28/15 0307  AST 85* 85* 74*  ALT 34 34 32  ALKPHOS 122 115 108  BILITOT 5.8* 5.6* 6.0*  PROT 6.4* 6.4* 6.0*  ALBUMIN 1.5* 1.5* 1.5*   No results for input(s): LIPASE, AMYLASE in the last 168 hours.  Recent Labs Lab 12/28/15 0951  AMMONIA 113*   CBC:  Recent Labs Lab  12/26/15 2230 12/27/15 0345 12/27/15 0951 12/27/15 1454 12/28/15 0307 12/28/15 0951 12/29/15 0616  WBC 10.1 10.6*  --   --  8.9  --   --   NEUTROABS 6.8  --   --   --   --   --   --   HGB 5.8* 6.0* 6.8* 6.8* 6.8* 7.7* 8.0*  HCT 17.9* 18.8* 21.2* 20.4* 20.2* 23.2* 24.2*  MCV 104.1* 102.7*  --   --  94.8  --   --   PLT 131* 120*  --   --  93*  --   --    Cardiac Enzymes:  Recent Labs Lab 12/26/15 2230 12/27/15 0345 12/27/15 0951 12/27/15 1454  TROPONINI 0.05* 0.04* 0.04* 0.04*     Assessment/Plan  Liver cirrhosis:  He is still drinking.  Continue with steroid as per GI with outpatient taper.  Will get PT evaluation.   GI Bleed:  No evidence of bleeding.  Will continue with PPI.  Octreotide has been stopped.  Will check another H and H in the am.  H and H has been stable.  I have added Nadelol.  Plan to d/c home tomorrow.   Hypomagnesemia:  Have given IV supplement today.    Elevated NH3:  Continue with Latulose and Xafaxan.    Other plans as per orders.  Code Status: FULL CODE>    Orvan Falconer, MD.  FACP [facility] Pager [phone] 7pm to 7am.  12/29/2015, 5:28 PM

## 2015-12-29 NOTE — Addendum Note (Signed)
Addendum  created 12/29/15 1144 by Laverle Hobby, CRNA   Sign clinical note

## 2015-12-29 NOTE — Progress Notes (Signed)
Subjective: Some difficulty breathing during exam because he's laying flat to prep for central lign dressing change. Asks when his LE edema will go away. Denies abdominal pain. No further GI bleeding or melena noted. Last bowel movement last night. States his stretch marks haven't been this "full of fluid" before. No other upper or lower GI complaints.  Objective: Vital signs in last 24 hours: Temp:  [97.5 F (36.4 C)-98.2 F (36.8 C)] 98.2 F (36.8 C) (08/03 0627) Pulse Rate:  [88-95] 88 (08/03 0627) Resp:  [15-32] 20 (08/03 0627) BP: (120-135)/(69-86) 128/77 (08/03 0627) SpO2:  [100 %] 100 % (08/03 0627) Last BM Date: 12/27/15 General:   Alert and oriented, pleasant Head:  Normocephalic and atraumatic. Eyes:  Possible slight scleral icterus. Conjuctiva pink.  Heart:  S1, S2 present, no murmurs noted.  Lungs: Clear to auscultation bilaterally, without wheezing, rales, or rhonchi. Somewhat increased work of breathing when laying flat. Abdomen:  Distended, obese abdomen with edema. Bowel sounds present, non-tender. Stretch marks versus caput medusae noted. No HSM or hernias noted although abdominal exam limited by abdominal girth. No rebound or guarding. No masses appreciated. Umbilical hernia noted soft and non-tender. Extremities:  Widespread edema/anasarca with 2-3+ bilateral pitting edema lower extremities with pitting noted up into mid-thighs. Neurologic:  Alert and  oriented x4;  grossly normal neurologically. Psych:  Alert and cooperative. Normal mood and affect.  Intake/Output from previous day: 08/02 0701 - 08/03 0700 In: 2632.9 [P.O.:1200; I.V.:1097.9; Blood:335] Out: 600 [Urine:600] Intake/Output this shift: No intake/output data recorded.  Lab Results:  Recent Labs  12/26/15 2230 12/27/15 0345  12/28/15 0307 12/28/15 0951 12/29/15 0616  WBC 10.1 10.6*  --  8.9  --   --   HGB 5.8* 6.0*  < > 6.8* 7.7* 8.0*  HCT 17.9* 18.8*  < > 20.2* 23.2* 24.2*  PLT 131*  120*  --  93*  --   --   < > = values in this interval not displayed. BMET  Recent Labs  12/26/15 2230 12/27/15 0345 12/28/15 0307  NA 127* 130* 126*  K 3.9 4.1 4.6  CL 105 102 106  CO2 15* 16* 21*  GLUCOSE 102* 107* 128*  BUN 16 17 18   CREATININE 1.60* 1.72* 1.46*  CALCIUM 7.4* 7.6* 7.3*   LFT  Recent Labs  12/26/15 2230 12/27/15 0345 12/28/15 0307  PROT 6.4* 6.4* 6.0*  ALBUMIN 1.5* 1.5* 1.5*  AST 85* 85* 74*  ALT 34 34 32  ALKPHOS 122 115 108  BILITOT 5.8* 5.6* 6.0*   PT/INR  Recent Labs  12/26/15 2230 12/28/15 0951  LABPROT 28.5* 27.2*  INR 2.62 2.46   Hepatitis Panel No results for input(s): HEPBSAG, HCVAB, HEPAIGM, HEPBIGM in the last 72 hours.   Studies/Results: No results found.  Assessment: 45 year old male with history of ETOH cirrhosis, ETOH hepatitis, ETOH abuse, presenting with decompensated cirrhosis, acute blood loss anemia, melena. Received 2 units PRBCs 12/27/15. Has continued to drink alcohol daily despite recommendations for abstinence, with poor prognosis. Discriminant function 66-84, MELD 28 on admission, Child Pugh C.   Having continued intermittent rectal bleeding with hepatic coagulopathy: INR >2.6, plt 120, hgb 6.8 on 8/1. 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 GI bleed presumed lower GI tract d/t hemorrhoids in the setting of ESLD and coagulopathy. Recommended d/c protonix and Octreotide gtts, start Protonix 40 mg po bid, consider non-selective  beta blocker at d/c for variceal prophylaxis. Defer colonoscopy due to coagulopathy, reassess at 30 days with risk/benefit-based decision understanding low probability of ability to intervene on colon lesion. Recommend surveillance EGD 3 months.  MELD increased to 32. Anasarca from hypoalbuminemia with recommendation to not diurese to prevent precipitation of  ARF and hepatorenal syndrome, all IV meds to be concentrated. Not a transplant candidate due to continued ETOH. Started on prednisolone due to discriminant function to promote increased 30 day mortality (Need to calculate Lille Score after 7th dose on 8/7 or 8/8 for decision on whether to continue. Refused DNR, refused [place] to discuss end-stage status of his liver disease with family.  Labs yesterday/today: Ammonia 113, Hgb improved to 7.7 (yest) and 8.0 (today), INR improved to 2.46. Cratinine remaining stable/improved yesterday to 1.46. Seems somewhat in denial about his ESLD asking when his edema will go away and if he's able to go home today.   End stage liver disease/ETOH hepatitis: Discriminate function 60s-80s, started on prenisolone. Refuses DNR, considering palliative care consult. Prognosis poor.  GI bleed: Grade I to II varices without stigmata of bleeding. Likely lower GI/hemorrhoid bleed in setting of liver-related coagulopathy. Consider non-selective beta-blocker on discharge for prophylaxis. Elevated ammonia at risk for development of hepatic encephalopathy, although he is AAO x 4 today.   Plan: 1. Continue to monitor labs: CMP and INR tomorrow 2. Xifaxan for HE prophylaxis 3. Consider Nadolol on discharge for variceal prophylaxis. 4. Supportive measures 5. Avoid excessive fluids/IV fluids for anasarca   Walden Field, AGNP-C Adult & Gerontological Nurse Practitioner [facility]    LOS: 2 days    12/29/2015, 8:40 AM

## 2015-12-29 NOTE — Anesthesia Postprocedure Evaluation (Signed)
Anesthesia Post Note  Patient: Antonio [name]  Procedure(s) Performed: Procedure(s) (LRB): ESOPHAGOGASTRODUODENOSCOPY (EGD) WITH PROPOFOL ([name])  Patient location during evaluation: Nursing Unit Anesthesia Type: MAC Level of consciousness: sedated Pain management: satisfactory to patient Vital Signs Assessment: post-procedure vital signs reviewed and stable Respiratory status: spontaneous breathing Cardiovascular status: stable Anesthetic complications: no Comments: Drowsy but able to answer questions.   Denied any issues with Anesthesia from procedure yesterday    Last Vitals:  Vitals:   12/28/15 2103 12/29/15 0627  BP: 130/75 128/77  Pulse: 95 88  Resp: 20 20  Temp: 36.6 C 36.8 C    Last Pain:  Vitals:   12/29/15 0745  TempSrc:   PainSc: 0-No pain                 Kaila Devries D

## 2015-12-30 DIAGNOSIS — E876 Hypokalemia: Secondary | ICD-10-CM

## 2015-12-30 LAB — TYPE AND SCREEN
ABO/RH(D): O NEG
ANTIBODY SCREEN: NEGATIVE
UNIT DIVISION: 0
UNIT DIVISION: 0
Unit division: 0
Unit division: 0
Unit division: 0
Unit division: 0

## 2015-12-30 LAB — COMPREHENSIVE METABOLIC PANEL
ALT: 22 U/L (ref 17–63)
AST: 45 U/L — ABNORMAL HIGH (ref 15–41)
Albumin: 1 g/dL — ABNORMAL LOW (ref 3.5–5.0)
Alkaline Phosphatase: 122 U/L (ref 38–126)
BILIRUBIN TOTAL: 2.6 mg/dL — AB (ref 0.3–1.2)
BUN: 13 mg/dL (ref 6–20)
CALCIUM: 5.3 mg/dL — AB (ref 8.9–10.3)
CHLORIDE: 118 mmol/L — AB (ref 101–111)
CO2: 17 mmol/L — AB (ref 22–32)
Creatinine, Ser: 0.6 mg/dL — ABNORMAL LOW (ref 0.61–1.24)
Glucose, Bld: 79 mg/dL (ref 65–99)
Potassium: 2.8 mmol/L — ABNORMAL LOW (ref 3.5–5.1)
Sodium: 136 mmol/L (ref 135–145)
Total Protein: 4.2 g/dL — ABNORMAL LOW (ref 6.5–8.1)

## 2015-12-30 LAB — PROTIME-INR
INR: 2.2
PROTHROMBIN TIME: 24.8 s — AB (ref 11.4–15.2)

## 2015-12-30 LAB — MAGNESIUM: MAGNESIUM: 0.9 mg/dL — AB (ref 1.7–2.4)

## 2015-12-30 MED ORDER — MAGNESIUM SULFATE 4 GM/100ML IV SOLN
4.0000 g | Freq: Once | INTRAVENOUS | Status: AC
  Administered 2015-12-30 (×2): 4 g via INTRAVENOUS
  Filled 2015-12-30: qty 100

## 2015-12-30 MED ORDER — POTASSIUM CHLORIDE 10 MEQ/100ML IV SOLN
10.0000 meq | INTRAVENOUS | Status: AC
  Administered 2015-12-30 (×4): 10 meq via INTRAVENOUS
  Filled 2015-12-30 (×4): qty 100

## 2015-12-30 MED ORDER — POTASSIUM CHLORIDE CRYS ER 20 MEQ PO TBCR
20.0000 meq | EXTENDED_RELEASE_TABLET | Freq: Every day | ORAL | Status: DC
  Administered 2015-12-30 – 2015-12-31 (×2): 20 meq via ORAL
  Filled 2015-12-30 (×2): qty 1

## 2015-12-30 NOTE — Evaluation (Signed)
Physical Therapy Evaluation Patient Details Name: Antonio [name] MRN: [id number] DOB: 09/27/1970 Today's Date: 12/30/2015   History of Present Illness  45 yo M admitted 12/26/2015 due to SOB with history of alcohol liver disease who came to the hospital with worsening leg swelling and shortness of breath on exertion.  Patient drinks 3-4 beers and a pint of liquor every day.  Dx: Anemia- likely from GI bleed.  He was given transfusion of 4 units of PRBC.  Hgb on 8/3 was 8.0, and Hct: 24.2 and INR: 2.20.  EGD found non-bleeding grade I and II esophageal varices, and rectal bleeding presumed to be from LGI tract and due tohemorrhoids. PMH: ETOH abuse, alcoholic hepatitis, cirrhosis with alcoholism.  Clinical Impression  Pt received in bed, and was agreeable to PT evaluation.  Pt expressed that he was independent with ambulation, but had required assistance from his wife for dressing and toileting for the past 2-3 weeks.  During today's PT valuation, he was modified independent/independent with all functional mobility, and gait.  No further skilled PT is needed at this time, will sign off.     Follow Up Recommendations No PT follow up    Equipment Recommendations  None recommended by PT    Recommendations for Other Services       Precautions / Restrictions Precautions Precautions: None Restrictions Weight Bearing Restrictions: No      Mobility  Bed Mobility Overal bed mobility: Modified Independent                Transfers Overall transfer level: Modified independent                  Ambulation/Gait Ambulation/Gait assistance: Independent Ambulation Distance (Feet): 70 Feet Assistive device: None Gait Pattern/deviations: Wide base of support;Step-through pattern     General Gait Details: increased lateral sway.  Pt expressed that his gait is limited due to the amout of edema in his LE's.    Stairs            Wheelchair Mobility    Modified Rankin (Stroke  Patients Only)       Balance Overall balance assessment: Independent                                           Pertinent Vitals/Pain Pain Assessment: No/denies pain    Home Living   Living Arrangements: Spouse/significant other (wife- works during the day)   Type of Home: Apartment Home Access: Level entry     Home Layout: One level [place]: None      Prior Function Level of Independence: Needs assistance   Gait / Transfers Assistance Needed: independent  ADL's / Homemaking Assistance Needed: Wife assists pt for 2-3 weeks to use the bathroom, & dressing        Hand Dominance   Dominant Hand: Right    Extremity/Trunk Assessment   Upper Extremity Assessment: Overall WFL for tasks assessed           Lower Extremity Assessment: Overall WFL for tasks assessed         Communication   Communication: No difficulties  Cognition Arousal/Alertness: Awake/alert Behavior During Therapy: WFL for tasks assessed/performed Overall Cognitive Status: Within Functional Limits for tasks assessed                      General Comments  Exercises        Assessment/Plan    PT Assessment Patent does not need any further PT services  PT Diagnosis Difficulty walking   PT Problem List Decreased activity tolerance;Decreased mobility  PT Treatment Interventions     PT Goals (Current goals can be found in the Care Plan section) Acute Rehab PT Goals Patient Stated Goal: Pt wants to go home.  PT Goal Formulation: With patient Time For Goal Achievement: 01/06/16    Frequency     Barriers to discharge        Co-evaluation               End of Session Equipment Utilized During Treatment: Gait belt Activity Tolerance: Patient tolerated treatment well Patient left: in chair           Time: [id number] PT Time Calculation (min) (ACUTE ONLY): 20 min   Charges:   PT Evaluation $PT Eval Low Complexity: 1 Procedure      PT G Codes:       Beth Maureena Dabbs, PT, DPT X: [phone]

## 2015-12-30 NOTE — Progress Notes (Signed)
[facility] PROGRESS NOTE  Antonio [name] [id number] DOB: 1970/08/08    PCP:   Deloria Lair, MD   HPI:  Antonio Smithis an 45 y.o.malewith hx of alcoholic cirrhosis, still drinking, admitted for GI Bleed. He was originally started on IV octrotide, and was given transfusion of 4 units of PRBC, with eventual Hb rise from 5.8 g per dL to 7.7. His INR is elevated, not corrected with Vit K. He also has elevated NH3 level to 113. He was seen in consultation with GI, and had EGD showing non bleeding varices. His octreotide was discontinued, and he was continued on steroid and PPI, with thought to have GI follow up for colonoscopy. He has no further bleeding.  He was started on Nadalol and Lactulose with Xafaxan.  He was evaluated by PT, and doesn't require further rehab.  His K was low at 2.8 mE/L today.    Rewiew of Systems:  Constitutional: Negative for malaise, fever and chills. No significant weight loss or weight gain Eyes: Negative for eye pain, redness and discharge, diplopia, visual changes, or flashes of light. ENMT: Negative for ear pain, hoarseness, nasal congestion, sinus pressure and sore throat. No headaches; tinnitus, drooling, or problem swallowing. Cardiovascular: Negative for chest pain, palpitations, diaphoresis, dyspnea and peripheral edema. ; No orthopnea, PND Respiratory: Negative for cough, hemoptysis, wheezing and stridor. No pleuritic chestpain. Gastrointestinal: Negative for nausea, vomiting, diarrhea, constipation, abdominal pain, melena, blood in stool, hematemesis, jaundice and rectal bleeding.    Genitourinary: Negative for frequency, dysuria, incontinence,flank pain and hematuria; Musculoskeletal: Negative for back pain and neck pain. Negative for swelling and trauma.;  Skin: . Negative for pruritus, rash, abrasions, bruising and skin lesion.; ulcerations Neuro: Negative for headache, lightheadedness and neck stiffness. Negative for weakness, altered level  of consciousness , altered mental status, extremity weakness, burning feet, involuntary movement, seizure and syncope.  Psych: negative for anxiety, depression, insomnia, tearfulness, panic attacks, hallucinations, paranoia, suicidal or homicidal ideation   Past Medical History:  Diagnosis Date  . Alcohol abuse   . Alcoholic hepatitis   . Cirrhosis with alcoholism [facility])     Past Surgical History:  Procedure Laterality Date  . Colonoscopy     per patient around 2014 in [place]     Medications:  HOME MEDS: Prior to Admission medications   Medication Sig Start Date End Date Taking? Authorizing Provider  Calcium Carb-Cholecalciferol (CALCIUM 600+D) 600-800 MG-UNIT TABS Take 1 tablet by mouth daily.   Yes Historical Provider, MD  furosemide (LASIX) 20 MG tablet Take 20 mg by mouth daily as needed for fluid.   Yes Historical Provider, MD  potassium chloride SA (K-DUR,KLOR-CON) 20 MEQ tablet Take 20 mEq by mouth 2 (two) times daily.   Yes Historical Provider, MD  spironolactone (ALDACTONE) 100 MG tablet Take 100 mg by mouth daily.   Yes Historical Provider, MD     Allergies:  No Known Allergies  Social History:   reports that he has been smoking Cigarettes.  He has been smoking about 0.25 packs per day. He has never used smokeless tobacco. He reports that he drinks about 3.6 oz of alcohol per week . He reports that he does not use drugs.  Family History: Family History  Problem Relation Age of Onset  . Colon cancer Neg Hx   . Liver disease Neg Hx      Physical Exam: Vitals:   12/29/15 1436 12/29/15 2212 12/29/15 2313 12/30/15 0445  BP: 114/69 120/63  121/77  Pulse: 74 75  70  Resp: 18 20  18   Temp: 98.2 F (36.8 C)   98.7 F (37.1 C)  TempSrc: Oral Oral  Oral  SpO2: 100% 100% 97% 99%  Weight:      Height:       Blood pressure 121/77, pulse 70, temperature 98.7 F (37.1 C), temperature source Oral, resp. rate 18, height 6\' 2"  (1.88 m), weight (!) 162.9 kg (359 lb 2.1  oz), SpO2 99 %.  GEN:  Pleasant  patient lying in the stretcher in no acute distress; cooperative with exam. PSYCH:  alert and oriented x4; does not appear anxious or depressed; affect is appropriate. HEENT: Mucous membranes pink and anicteric; PERRLA; EOM intact; no cervical lymphadenopathy nor thyromegaly or carotid bruit; no JVD; There were no stridor. Neck is very supple. Breasts:: Not examined CHEST WALL: No tenderness CHEST: Normal respiration, clear to auscultation bilaterally.  HEART: Regular rate and rhythm.  There are no murmur, rub, or gallops.   BACK: No kyphosis or scoliosis; no CVA tenderness ABDOMEN: soft and non-tender; no masses, no organomegaly, normal abdominal bowel sounds; no pannus; no intertriginous candida. There is no rebound and no distention. Rectal Exam: Not done EXTREMITIES: No bone or joint deformity; age-appropriate arthropathy of the hands and knees; no edema; no ulcerations.  There is no calf tenderness. Genitalia: not examined PULSES: 2+ and symmetric SKIN: Normal hydration no rash or ulceration CNS: Cranial nerves 2-12 grossly intact no focal lateralizing neurologic deficit.  Speech is fluent; uvula elevated with phonation, facial symmetry and tongue midline. DTR are normal bilaterally, cerebella exam is intact, barbinski is negative and strengths are equaled bilaterally.  No sensory loss.   Labs on Admission:  Basic Metabolic Panel:  Recent Labs Lab 12/26/15 2230 12/27/15 0345 12/28/15 0307 12/30/15 0559  NA 127* 130* 126* 136  K 3.9 4.1 4.6 2.8*  CL 105 102 106 118*  CO2 15* 16* 21* 17*  GLUCOSE 102* 107* 128* 79  BUN 16 17 18 13   CREATININE 1.60* 1.72* 1.46* 0.60*  CALCIUM 7.4* 7.6* 7.3* 5.3*  MG 1.2*  --   --   --    Liver Function Tests:  Recent Labs Lab 12/26/15 2230 12/27/15 0345 12/28/15 0307 12/30/15 0559  AST 85* 85* 74* 45*  ALT 34 34 32 22  ALKPHOS 122 115 108 122  BILITOT 5.8* 5.6* 6.0* 2.6*  PROT 6.4* 6.4* 6.0* 4.2*   ALBUMIN 1.5* 1.5* 1.5* 1.0*    Recent Labs Lab 12/28/15 0951  AMMONIA 113*   CBC:  Recent Labs Lab 12/26/15 2230 12/27/15 0345 12/27/15 0951 12/27/15 1454 12/28/15 0307 12/28/15 0951 12/29/15 0616  WBC 10.1 10.6*  --   --  8.9  --   --   NEUTROABS 6.8  --   --   --   --   --   --   HGB 5.8* 6.0* 6.8* 6.8* 6.8* 7.7* 8.0*  HCT 17.9* 18.8* 21.2* 20.4* 20.2* 23.2* 24.2*  MCV 104.1* 102.7*  --   --  94.8  --   --   PLT 131* 120*  --   --  93*  --   --    Cardiac Enzymes:  Recent Labs Lab 12/26/15 2230 12/27/15 0345 12/27/15 0951 12/27/15 1454  TROPONINI 0.05* 0.04* 0.04* 0.04*    Liver cirrhosis:He is still drinking. Continue with steroid as per GI with outpatient taper. PT evaluated, and felt no further PT help is needed.    GI Bleed: No evidence of bleeding. Will  continue with PPI. Octreotide has been stopped. Will check another H and H in the am. H and H has been stable.  I have added Nadelol.    Hypomagnesemia: Have given IV supplement today.   Hypokalemia:  Will replete with IV and orally.  His K is quite low today at  2.8 mE/L.   Elevated NH3: Continue with Latulose and Xafaxan.    Other plans as per orders.  Code Status: FULL CODE>    Orvan Falconer, MD.  FACP [facility] Pager [phone] 7pm to 7am.  12/30/2015, 12:27 PM

## 2015-12-30 NOTE — Progress Notes (Signed)
    Subjective: Continues edema, no better but no worse. Better breathing today. Last bowel movement this morning without noted blood, soft for tending toward formed. No N/V or abdominal pain. No other GI complaints at this time.  Objective: Vital signs in last 24 hours: Temp:  [98.2 F (36.8 C)-98.7 F (37.1 C)] 98.7 F (37.1 C) (08/04 0445) Pulse Rate:  [70-75] 70 (08/04 0445) Resp:  [18-20] 18 (08/04 0445) BP: (114-121)/(63-77) 121/77 (08/04 0445) SpO2:  [97 %-100 %] 99 % (08/04 0445) Last BM Date: 12/29/15 General:   Alert and oriented, pleasant. Morbidly obese. Head:  Normocephalic and atraumatic. Heart:  S1, S2 present, no murmurs noted.  Lungs: Clear to auscultation bilaterally, without wheezing, rales, or rhonchi.  Abdomen:  Bowel sounds present, non-tender. Obese but relatively soft, large girth without tense ascites. No HSM or hernias noted but limited due to abdominal girth. No rebound or guarding. Continued swollen stretch marks versus caput medusae Msk:  Symmetrical without gross deformities. Extremities:  With edema/anasarca. Bilateral LE pitting edema 2-3+. Pitting edema extends up to the middle to upper thigh area. Neurologic:  Alert and  oriented x4;  grossly normal neurologically. Psych:  Alert and cooperative. Normal mood and affect.  Intake/Output from previous day: 08/03 0701 - 08/04 0700 In: 1110 [P.O.:1080; I.V.:30] Out: -  Intake/Output this shift: Total I/O In: 270 [P.O.:240; I.V.:30] Out: -   Lab Results:  Recent Labs  12/28/15 0307 12/28/15 0951 12/29/15 0616  WBC 8.9  --   --   HGB 6.8* 7.7* 8.0*  HCT 20.2* 23.2* 24.2*  PLT 93*  --   --    BMET  Recent Labs  12/28/15 0307 12/30/15 0559  NA 126* 136  K 4.6 2.8*  CL 106 118*  CO2 21* 17*  GLUCOSE 128* 79  BUN 18 13  CREATININE 1.46* 0.60*  CALCIUM 7.3* 5.3*   LFT  Recent Labs  12/28/15 0307 12/30/15 0559  PROT 6.0* 4.2*  ALBUMIN 1.5* 1.0*  AST 74* 45*  ALT 32 22   ALKPHOS 108 122  BILITOT 6.0* 2.6*   PT/INR  Recent Labs  12/28/15 0951 12/30/15 0559  LABPROT 27.2* 24.8*  INR 2.46 2.20   Hepatitis Panel No results for input(s): HEPBSAG, HCVAB, HEPAIGM, HEPBIGM in the last 72 hours.   Studies/Results: No results found.  Assessment: Active Problems:   Anemia, unspecified   Alcohol abuse   Hepatic cirrhosis (HCC)   Alcoholic cirrhosis of liver with ascites (HCC)   DOE (dyspnea on exertion)   Peripheral edema  Alcohol abuse: continued ETOH; Advise abstinence.  Anemia: likely lower GI/hemorrhoid bleeding in the setting of coagulopathy. Hgb was improving.  ESLD/ETOH Hepatitis: Continue prednisolone, monitor labs for worsening. Evaluate Lille score at day 7 to query whether to continue steroids. Poor prognosis, refusing DNR.   Plan: 1. Monitor for recurrent GI bleed 2. Monitor H/H (check CBC tomorrow) 3. Check ammonia tomorrow on Xifaxan and Lactulose. 4. Lille score day 7 5. Consider non-selective beta blocker for variceal prophylaxis.    Walden Field, AGNP-C Adult & Gerontological Nurse Practitioner [facility]    LOS: 3 days    12/30/2015, 2:13 PM

## 2015-12-30 NOTE — Progress Notes (Signed)
CRITICAL VALUE ALERT  Critical value received:  Magnesium 0.9  Date of notification: 12/30/15  Time of notification: [id number]  Critical value read back:Yes  Nurse who received alert:  Janan Ridge, RN  MD notified (1st page):  Dr. Truman Hayward  Time of first page: 1336  MD notified (2nd page):  Time of second page:  Responding MD:    Time MD responded:

## 2015-12-30 NOTE — Progress Notes (Signed)
Dr. Oneida Alar called with new order for dietician consult foe education related to low sodium diet.

## 2015-12-31 LAB — HEPATIC FUNCTION PANEL
ALK PHOS: 203 U/L — AB (ref 38–126)
ALT: 34 U/L (ref 17–63)
AST: 66 U/L — ABNORMAL HIGH (ref 15–41)
Albumin: 1.4 g/dL — ABNORMAL LOW (ref 3.5–5.0)
BILIRUBIN DIRECT: 1.5 mg/dL — AB (ref 0.1–0.5)
BILIRUBIN INDIRECT: 1.9 mg/dL — AB (ref 0.3–0.9)
BILIRUBIN TOTAL: 3.4 mg/dL — AB (ref 0.3–1.2)
Total Protein: 5.7 g/dL — ABNORMAL LOW (ref 6.5–8.1)

## 2015-12-31 LAB — CBC WITH DIFFERENTIAL/PLATELET
BASOS PCT: 0 %
Basophils Absolute: 0 10*3/uL (ref 0.0–0.1)
Eosinophils Absolute: 0.2 10*3/uL (ref 0.0–0.7)
Eosinophils Relative: 2 %
HEMATOCRIT: 23.7 % — AB (ref 39.0–52.0)
Hemoglobin: 7.4 g/dL — ABNORMAL LOW (ref 13.0–17.0)
LYMPHS ABS: 3 10*3/uL (ref 0.7–4.0)
LYMPHS PCT: 22 %
MCH: 30.6 pg (ref 26.0–34.0)
MCHC: 31.2 g/dL (ref 30.0–36.0)
MCV: 97.9 fL (ref 78.0–100.0)
MONO ABS: 2.3 10*3/uL — AB (ref 0.1–1.0)
MONOS PCT: 17 %
NEUTROS ABS: 8.2 10*3/uL — AB (ref 1.7–7.7)
Neutrophils Relative %: 60 %
Platelets: 106 10*3/uL — ABNORMAL LOW (ref 150–400)
RBC: 2.42 MIL/uL — ABNORMAL LOW (ref 4.22–5.81)
RDW: 19.5 % — AB (ref 11.5–15.5)
WBC: 13.8 10*3/uL — ABNORMAL HIGH (ref 4.0–10.5)

## 2015-12-31 LAB — BASIC METABOLIC PANEL
ANION GAP: 5 (ref 5–15)
BUN: 17 mg/dL (ref 6–20)
CALCIUM: 7.5 mg/dL — AB (ref 8.9–10.3)
CHLORIDE: 104 mmol/L (ref 101–111)
CO2: 22 mmol/L (ref 22–32)
Creatinine, Ser: 0.97 mg/dL (ref 0.61–1.24)
GFR calc Af Amer: >60 mL/min (ref >60)
GFR calc non Af Amer: >60 mL/min (ref >60)
GLUCOSE: 105 mg/dL — AB (ref 65–99)
Potassium: 4.4 mmol/L (ref 3.5–5.1)
Sodium: 131 mmol/L — ABNORMAL LOW (ref 135–145)

## 2015-12-31 LAB — PROTIME-INR
INR: 2.35
PROTHROMBIN TIME: 26.2 s — AB (ref 11.4–15.2)

## 2015-12-31 LAB — AMMONIA: Ammonia: 47 umol/L — ABNORMAL HIGH (ref 9–35)

## 2015-12-31 MED ORDER — LACTULOSE 10 GM/15ML PO SOLN: 10.0000 g | Freq: Three times a day (TID) | ORAL | 0 refills | Status: DC | PRN

## 2015-12-31 MED ORDER — PANTOPRAZOLE SODIUM 40 MG PO TBEC: 40.0000 mg | DELAYED_RELEASE_TABLET | Freq: Two times a day (BID) | ORAL | 1 refills | Status: DC

## 2015-12-31 MED ORDER — PREDNISOLONE SODIUM PHOSPHATE 15 MG/5ML PO SOLN: 40.0000 mg | Freq: Every day | ORAL | 0 refills | Status: DC

## 2015-12-31 MED ORDER — RIFAXIMIN 550 MG PO TABS: 550.0000 mg | ORAL_TABLET | Freq: Two times a day (BID) | ORAL | 1 refills | Status: DC

## 2015-12-31 MED ORDER — THIAMINE HCL 100 MG PO TABS: 100.0000 mg | ORAL_TABLET | Freq: Every day | ORAL | 1 refills | Status: DC

## 2015-12-31 MED ORDER — NADOLOL 40 MG PO TABS: 40.0000 mg | ORAL_TABLET | Freq: Two times a day (BID) | ORAL | 2 refills | Status: DC

## 2015-12-31 MED ORDER — FOLIC ACID 1 MG PO TABS: 1.0000 mg | ORAL_TABLET | Freq: Every day | ORAL | 1 refills | Status: DC

## 2015-12-31 NOTE — Discharge Summary (Signed)
Physician Discharge Summary  Drayce Hollings [id number] DOB: February 14, 1971 DOA: 12/26/2015  PCP: Deloria Lair, MD  Admit date: 12/26/2015 Discharge date: 12/31/2015  Time spent: 35 minutes  Recommendations for Outpatient Follow-up:  1. Follow up with PCP in one week. 2. Follow up with Dr Oneida Alar as scheduled.   l Discharge Diagnoses:  Active Problems:   Anemia, unspecified   Alcohol abuse   Hepatic cirrhosis (HCC)   Alcoholic cirrhosis of liver with ascites (HCC)   DOE (dyspnea on exertion)   Peripheral edema   Discharge Condition: much improved.   Diet recommendation: cardiac healthy.   Filed Weights   12/26/15 2204 12/27/15 0241 12/28/15 0500  Weight: 102.1 kg (225 lb) (!) 158.8 kg (350 lb 1.5 oz) (!) 162.9 kg (359 lb 2.1 oz)    History of present illness: Patient was admitted for upper GI bleed by Dr Antonio [name] on Dec 27, 2015.  As per her H and P:  "  Antonio [name]  is a 45 y.o. male, With history of alcohol liver disease who came to the hospital with worsening leg swelling and shortness of breath on exertion. Patient usually follows [facility] for liver cirrhosis, but has not seen GI since 2016. He denies nausea vomiting, but admits to having diarrhea with black colored stool and also intermittently with frank blood. He denies chest pain. Patient drinks 3-4 beers and a pint of liquor everyday. In the ED lab work revealed anemia with hemoglobin 5.8   Hospital Course: Antonio Smithis an 45 y.o.malewith hx of alcoholic cirrhosis, still drinking, admitted for GI Bleed. He was originally started on IV octrotide, and was given transfusion of 4 units of PRBC, with eventual Hb rise from 5.8 g per dL to 7.7. His INR is elevated, not corrected with Vit K. He also has elevated NH3 level to 113. He was seen in consultation with GI, and had EGD showing non bleeding varices. His octreotide was discontinued, and he was continued on steroid and PPI, with thought to have GI follow up for colonoscopy.  He has no further bleeding. He was started on Nadalol and Lactulose with Xafaxan. He was evaluated by PT, and doesn't require further rehab.  His K was low at 2.8 mE/L and he was kept in the hospital and [place] was repleted.  He had no further bleeding and will see his PCP and GI in follow up.  He was started on Prednisone for alcoholic hepatitis, with the intention to taper it off at follow up with his PCP and GI.  He was warned repeatedly not to resume drinking.   Procedures:  Blood transfusions and EGD.   Consultations:  GI consultation with Dr Oneida Alar.   Discharge Exam: Vitals:   12/30/15 2137 12/31/15 0538  BP: 128/69 (!) 104/54  Pulse: 70 70  Resp: 20 20  Temp: 97.5 F (36.4 C) 97.7 F (36.5 C)    Discharge Instructions   Discharge Instructions    Diet - low sodium heart healthy    Complete by:  As directed   Discharge instructions    Complete by:  As directed   Follow up with PCP in one week and with Dr Oneida Alar in 1 month.   Increase activity slowly    Complete by:  As directed     Current Discharge Medication List    START taking these medications   Details  folic acid (FOLVITE) 1 MG tablet Take 1 tablet (1 mg total) by mouth daily. Qty: 30 tablet, Refills:  1    lactulose (CHRONULAC) 10 GM/15ML solution Take 15 mLs (10 g total) by mouth 3 (three) times daily as needed for mild constipation. Qty: 240 mL, Refills: 0    nadolol (CORGARD) 40 MG tablet Take 1 tablet (40 mg total) by mouth 2 (two) times daily. Qty: 60 tablet, Refills: 2    pantoprazole (PROTONIX) 40 MG tablet Take 1 tablet (40 mg total) by mouth 2 (two) times daily before a meal. Qty: 30 tablet, Refills: 1    prednisoLONE (ORAPRED) 15 MG/5ML solution Take 13.3 mLs (40 mg total) by mouth daily before breakfast. Qty: 100 mL, Refills: 0    rifaximin (XIFAXAN) 550 MG TABS tablet Take 1 tablet (550 mg total) by mouth 2 (two) times daily. Qty: 42 tablet, Refills: 1    thiamine 100 MG tablet Take 1  tablet (100 mg total) by mouth daily. Qty: 30 tablet, Refills: 1      CONTINUE these medications which have NOT CHANGED   Details  Calcium Carb-Cholecalciferol (CALCIUM 600+D) 600-800 MG-UNIT TABS Take 1 tablet by mouth daily.    potassium chloride SA (K-DUR,KLOR-CON) 20 MEQ tablet Take 20 mEq by mouth 2 (two) times daily.    spironolactone (ALDACTONE) 100 MG tablet Take 100 mg by mouth daily.      STOP taking these medications     furosemide (LASIX) 20 MG tablet        No Known Allergies    The results of significant diagnostics from this hospitalization (including imaging, microbiology, ancillary and laboratory) are listed below for reference.    Significant Diagnostic Studies: Dg Chest 2 View  Result Date: 12/27/2015 CLINICAL DATA:  Intermittent shortness of breath, bilateral lower extremity swelling for 1-1/2 weeks. History of liver disease. EXAM: CHEST  2 VIEW COMPARISON:  None. FINDINGS: Cardiomediastinal silhouette is unremarkable for this low inspiratory examination with crowded vasculature markings. The lungs are clear without pleural effusions or focal consolidations. Trachea projects midline and there is no pneumothorax. Included soft tissue planes and osseous structures are non-suspicious. Abdominal distention noted on the lateral radiograph. IMPRESSION: No active cardiopulmonary disease. Electronically Signed   By: Elon Alas M.D.   On: 12/27/2015 00:07    Microbiology: Recent Results (from the past 240 hour(s))  MRSA PCR Screening     Status: None   Collection Time: 12/27/15  2:36 AM  Result Value Ref Range Status   MRSA by PCR NEGATIVE NEGATIVE Final    Comment:        The GeneXpert MRSA Assay (FDA approved for NASAL specimens only), is one component of a comprehensive MRSA colonization surveillance program. It is not intended to diagnose MRSA infection nor to guide or monitor treatment for MRSA infections.      Labs: Basic Metabolic  Panel:  Recent Labs Lab 12/26/15 2230 12/27/15 0345 12/28/15 0307 12/30/15 0559 12/31/15 0555  NA 127* 130* 126* 136 131*  K 3.9 4.1 4.6 2.8* 4.4  CL 105 102 106 118* 104  CO2 15* 16* 21* 17* 22  GLUCOSE 102* 107* 128* 79 105*  BUN 16 17 18 13 17   CREATININE 1.60* 1.72* 1.46* 0.60* 0.97  CALCIUM 7.4* 7.6* 7.3* 5.3* 7.5*  MG 1.2*  --   --  0.9*  --    Liver Function Tests:  Recent Labs Lab 12/26/15 2230 12/27/15 0345 12/28/15 0307 12/30/15 0559 12/31/15 0555  AST 85* 85* 74* 45* 66*  ALT 34 34 32 22 34  ALKPHOS 122 115 108 122 203*  BILITOT 5.8* 5.6* 6.0* 2.6* 3.4*  PROT 6.4* 6.4* 6.0* 4.2* 5.7*  ALBUMIN 1.5* 1.5* 1.5* 1.0* 1.4*    Recent Labs Lab 12/28/15 0951 12/31/15 0555  AMMONIA 113* 47*   CBC:  Recent Labs Lab 12/26/15 2230 12/27/15 0345  12/27/15 1454 12/28/15 0307 12/28/15 0951 12/29/15 0616 12/31/15 0555  WBC 10.1 10.6*  --   --  8.9  --   --  13.8*  NEUTROABS 6.8  --   --   --   --   --   --  8.2*  HGB 5.8* 6.0*  < > 6.8* 6.8* 7.7* 8.0* 7.4*  HCT 17.9* 18.8*  < > 20.4* 20.2* 23.2* 24.2* 23.7*  MCV 104.1* 102.7*  --   --  94.8  --   --  97.9  PLT 131* 120*  --   --  93*  --   --  106*  < > = values in this interval not displayed. Cardiac Enzymes:  Recent Labs Lab 12/26/15 2230 12/27/15 0345 12/27/15 0951 12/27/15 1454  TROPONINI 0.05* 0.04* 0.04* 0.04*    Recent Labs  12/26/15 2230  BNP 30.0    Signed: Anna Beaird MD. Rosalita Chessman. [facility] 12/31/2015, 1:43 PM

## 2015-12-31 NOTE — Progress Notes (Signed)
Consult for diet education acknowledged. RD operating on different campus today. RD to follow up and educate if patient is still admitted on Monday. If needed, Handouts are available from sharepoint > applications > ADA manual > Client Ed > Cardiovascular > low sodium nutrition therapy  Burtis Junes RD, LDN, CNSC Clinical Nutrition Pager: [phone] 12/31/2015 7:30 AM

## 2016-01-03 ENCOUNTER — Encounter (HOSPITAL_COMMUNITY): Payer: Self-pay | Admitting: *Deleted

## 2016-01-03 ENCOUNTER — Inpatient Hospital Stay (HOSPITAL_COMMUNITY)
Admission: EM | Admit: 2016-01-03 | Discharge: 2016-01-17 | DRG: 441 | Disposition: A | Discharge status: Home Health | Payer: BLUE CROSS/BLUE SHIELD | Attending: Internal Medicine | Admitting: Internal Medicine

## 2016-01-03 ENCOUNTER — Emergency Department (HOSPITAL_COMMUNITY): Payer: BLUE CROSS/BLUE SHIELD

## 2016-01-03 ENCOUNTER — Telehealth: Payer: Self-pay | Admitting: Gastroenterology

## 2016-01-03 DIAGNOSIS — K7031 Alcoholic cirrhosis of liver with ascites: Secondary | ICD-10-CM | POA: Diagnosis present

## 2016-01-03 DIAGNOSIS — F1020 Alcohol dependence, uncomplicated: Secondary | ICD-10-CM | POA: Diagnosis present

## 2016-01-03 DIAGNOSIS — K7011 Alcoholic hepatitis with ascites: Secondary | ICD-10-CM | POA: Diagnosis present

## 2016-01-03 DIAGNOSIS — Z825 Family history of asthma and other chronic lower respiratory diseases: Secondary | ICD-10-CM

## 2016-01-03 DIAGNOSIS — I8510 Secondary esophageal varices without bleeding: Secondary | ICD-10-CM | POA: Diagnosis present

## 2016-01-03 DIAGNOSIS — K7030 Alcoholic cirrhosis of liver without ascites: Secondary | ICD-10-CM | POA: Diagnosis present

## 2016-01-03 DIAGNOSIS — R0600 Dyspnea, unspecified: Secondary | ICD-10-CM

## 2016-01-03 DIAGNOSIS — K429 Umbilical hernia without obstruction or gangrene: Secondary | ICD-10-CM | POA: Diagnosis present

## 2016-01-03 DIAGNOSIS — I82621 Acute embolism and thrombosis of deep veins of right upper extremity: Secondary | ICD-10-CM | POA: Diagnosis present

## 2016-01-03 DIAGNOSIS — E871 Hypo-osmolality and hyponatremia: Secondary | ICD-10-CM | POA: Diagnosis not present

## 2016-01-03 DIAGNOSIS — T502X5A Adverse effect of carbonic-anhydrase inhibitors, benzothiadiazides and other diuretics, initial encounter: Secondary | ICD-10-CM | POA: Diagnosis not present

## 2016-01-03 DIAGNOSIS — E876 Hypokalemia: Secondary | ICD-10-CM | POA: Diagnosis not present

## 2016-01-03 DIAGNOSIS — N179 Acute kidney failure, unspecified: Secondary | ICD-10-CM | POA: Diagnosis not present

## 2016-01-03 DIAGNOSIS — K7290 Hepatic failure, unspecified without coma: Secondary | ICD-10-CM | POA: Diagnosis not present

## 2016-01-03 DIAGNOSIS — F1010 Alcohol abuse, uncomplicated: Secondary | ICD-10-CM | POA: Diagnosis present

## 2016-01-03 DIAGNOSIS — Z515 Encounter for palliative care: Secondary | ICD-10-CM

## 2016-01-03 DIAGNOSIS — R040 Epistaxis: Secondary | ICD-10-CM | POA: Diagnosis not present

## 2016-01-03 DIAGNOSIS — R609 Edema, unspecified: Secondary | ICD-10-CM

## 2016-01-03 DIAGNOSIS — Z452 Encounter for adjustment and management of vascular access device: Secondary | ICD-10-CM

## 2016-01-03 DIAGNOSIS — K7460 Unspecified cirrhosis of liver: Secondary | ICD-10-CM

## 2016-01-03 DIAGNOSIS — R601 Generalized edema: Secondary | ICD-10-CM | POA: Diagnosis present

## 2016-01-03 DIAGNOSIS — R600 Localized edema: Secondary | ICD-10-CM

## 2016-01-03 DIAGNOSIS — K3189 Other diseases of stomach and duodenum: Secondary | ICD-10-CM | POA: Diagnosis present

## 2016-01-03 DIAGNOSIS — D689 Coagulation defect, unspecified: Secondary | ICD-10-CM | POA: Diagnosis present

## 2016-01-03 DIAGNOSIS — K7682 Hepatic encephalopathy: Secondary | ICD-10-CM | POA: Diagnosis present

## 2016-01-03 DIAGNOSIS — F17210 Nicotine dependence, cigarettes, uncomplicated: Secondary | ICD-10-CM | POA: Diagnosis present

## 2016-01-03 DIAGNOSIS — D6959 Other secondary thrombocytopenia: Secondary | ICD-10-CM | POA: Diagnosis present

## 2016-01-03 DIAGNOSIS — D649 Anemia, unspecified: Secondary | ICD-10-CM | POA: Diagnosis present

## 2016-01-03 DIAGNOSIS — D638 Anemia in other chronic diseases classified elsewhere: Secondary | ICD-10-CM | POA: Diagnosis present

## 2016-01-03 DIAGNOSIS — K219 Gastro-esophageal reflux disease without esophagitis: Secondary | ICD-10-CM | POA: Diagnosis present

## 2016-01-03 DIAGNOSIS — E2740 Unspecified adrenocortical insufficiency: Secondary | ICD-10-CM | POA: Diagnosis present

## 2016-01-03 DIAGNOSIS — D696 Thrombocytopenia, unspecified: Secondary | ICD-10-CM | POA: Diagnosis present

## 2016-01-03 DIAGNOSIS — D509 Iron deficiency anemia, unspecified: Secondary | ICD-10-CM | POA: Diagnosis present

## 2016-01-03 DIAGNOSIS — Z6841 Body Mass Index (BMI) 40.0 and over, adult: Secondary | ICD-10-CM

## 2016-01-03 DIAGNOSIS — R0609 Other forms of dyspnea: Secondary | ICD-10-CM

## 2016-01-03 DIAGNOSIS — N170 Acute kidney failure with tubular necrosis: Secondary | ICD-10-CM | POA: Diagnosis not present

## 2016-01-03 DIAGNOSIS — Z7189 Other specified counseling: Secondary | ICD-10-CM

## 2016-01-03 DIAGNOSIS — K766 Portal hypertension: Secondary | ICD-10-CM | POA: Diagnosis present

## 2016-01-03 NOTE — ED Provider Notes (Signed)
Emergency Department Provider Note   I have reviewed the triage vital signs and the nursing notes.   HISTORY  Chief Complaint Shortness of Breath   HPI Antonio [name] is a 45 y.o. male with PMHx of Cirrhosis and Alcoholic Hepatitis complaining of gradual onset and worsening, constant shortness of breath onset today. He also reports associated swelling around his eye and leg swelling that is much worse than his baseline leg swelling. Pt states he spoke with Dr. Oneida Alar who states he might be having an allergic reaction to a new medication. He reports swelling above his eye. Pt states he had to have fluid drained from his abdomen once before. Pt denies PMHx of Asthma, COPD, and CHF. Pt denies abdominal pain, cough, chest pain.  Past Medical History:  Diagnosis Date  . Alcohol abuse   . Alcoholic hepatitis   . Cirrhosis with alcoholism [facility])     Patient Active Problem List   Diagnosis Date Noted  . DOE (dyspnea on exertion)   . Peripheral edema   . Anemia, unspecified 12/27/2015  . Alcohol abuse 12/27/2015  . Decompensation of cirrhosis of liver ([place]) 12/27/2015  . Alcoholic cirrhosis of liver with ascites [facility])     Past Surgical History:  Procedure Laterality Date  . Colonoscopy     per patient around 2014 in [place] Review of patient's allergies indicates no known allergies.  Family History  Problem Relation Age of Onset  . Colon cancer Neg Hx   . Liver disease Neg Hx     Social History Social History  Substance Use Topics  . Smoking status: Current Some Day Smoker    Packs/day: 0.25    Types: Cigarettes  . Smokeless tobacco: Never Used  . Alcohol use 3.6 oz/week    6 Cans of beer per week     Comment: drinks 4-5 beers a day and 1/2 pint of liquor daily     Review of Systems  Constitutional: No fever ENT: Positive swelling around eye. Cardiovascular: Denies chest pain. Positive Leg swelling. Respiratory: Denies shortness of breath. Denies  cough. Gastrointestinal: No abdominal pain.   Genitourinary: Negative for dysuria. Musculoskeletal: Negative for back pain. Skin: Negative for rash.  10-point ROS otherwise negative.  ____________________________________________   PHYSICAL EXAM:  VITAL SIGNS: ED Triage Vitals [01/03/16 2044]  Enc Vitals Group     BP 120/65     Pulse Rate 67     Resp 22     Temp 97.7 F (36.5 C)     Temp Source Oral     SpO2 100 %     Weight (!) 357 lb (161.9 kg)     Height 6\' 2"  (1.88 m)    Constitutional: Alert and oriented. Well appearing and in no acute distress. Eyes: Conjunctivae are normal. PERRL. EOMI. Head: Atraumatic. Nose: No congestion/rhinnorhea. Mouth/Throat: Mucous membranes are moist.  Oropharynx non-erythematous. Neck: No stridor.  Cardiovascular: Normal rate, regular rhythm. Good peripheral circulation. Grossly normal heart sounds.   Respiratory: Increased respiratory effort and tachypnea.  No retractions. Lungs with faint rales at the bases bilaterally.  Gastrointestinal: Soft and nontender. Positive distention.  Musculoskeletal: No lower extremity tenderness. Positive 4+ pitting edema in bilateral LEs. No gross deformities of extremities. Neurologic:  Normal speech and language. No gross focal neurologic deficits are appreciated.  Skin:  Skin is warm, dry and intact. No rash noted. Psychiatric: Mood and affect are normal. Speech and behavior are normal.  ____________________________________________  LABS (all labs ordered are listed, but only abnormal results are displayed)  Labs Reviewed  COMPREHENSIVE METABOLIC PANEL - Abnormal; Notable for the following:       Result Value   Sodium 130 (*)    Glucose, Bld 100 (*)    Calcium 7.6 (*)    Total Protein 5.9 (*)    Albumin 1.5 (*)    AST 72 (*)    Alkaline Phosphatase 165 (*)    Total Bilirubin 3.2 (*)    Anion gap 0 (*)    All other components within normal limits  CBC WITH DIFFERENTIAL/PLATELET - Abnormal;  Notable for the following:    WBC 13.5 (*)    RBC 2.37 (*)    Hemoglobin 7.3 (*)    HCT 23.3 (*)    RDW 18.4 (*)    Platelets 103 (*)    Neutro Abs 8.3 (*)    Monocytes Absolute 2.4 (*)    All other components within normal limits  PROTIME-INR - Abnormal; Notable for the following:    Prothrombin Time 23.9 (*)    All other components within normal limits  BRAIN NATRIURETIC PEPTIDE - Abnormal; Notable for the following:    B Natriuretic Peptide 274.0 (*)    All other components within normal limits  BASIC METABOLIC PANEL - Abnormal; Notable for the following:    Sodium 130 (*)    Glucose, Bld 101 (*)    BUN 21 (*)    Calcium 7.9 (*)    Anion gap 1 (*)    All other components within normal limits  CBC - Abnormal; Notable for the following:    WBC 13.8 (*)    RBC 2.47 (*)    Hemoglobin 7.4 (*)    HCT 24.3 (*)    RDW 18.5 (*)    Platelets 116 (*)    All other components within normal limits  MAGNESIUM - Abnormal; Notable for the following:    Magnesium 1.3 (*)    All other components within normal limits  I-STAT TROPOININ, ED   ____________________________________________  EKG  Reviewed in MUSE. No STEMI.  ____________________________________________  RADIOLOGY  Dg Chest 2 View  Result Date: 01/03/2016 CLINICAL DATA:  45 y/o male here with c/o SOB, cough x2 days. Leg edema also noted by pt. Denies hx asthma, COPD, MI, HTN, DM, etc etc. Non-smoker EXAM: CHEST  2 VIEW COMPARISON:  12/26/2015 FINDINGS: Heart size is upper normal. There are no focal consolidations or pleural effusions. No pulmonary edema. Visualized osseous structures have a normal appearance. IMPRESSION: No evidence for acute cardiopulmonary abnormality. Electronically Signed   By: Nolon Nations M.D.   On: 01/03/2016 22:03    ____________________________________________   PROCEDURES  Procedure(s) performed:   Procedures  None ____________________________________________   INITIAL IMPRESSION /  ASSESSMENT AND PLAN / ED COURSE  Pertinent labs & imaging results that were available during my care of the patient were reviewed by me and considered in my medical decision making (see chart for details).  Patient with past nuchal history of cirrhosis and alcoholic hepatitis presents to the emergency department with difficulty breathing starting this morning. He was discharged on 8/5 after GI bleeding requiring multiple transfusions. He has significant lower extremity edema and some appreciable facial swelling. Does not appear to be allergic reaction in nature. Question if patient has significant volume overload contributing to his dyspnea. Plan for chest x-ray, labs, including BNP, and reassess.   11:00 PM Patient with continued dyspnea. Gave IV lasix  to begin diuresis. BNP elevated. No pulmonary edema on CXR but clinically the patient appears volume-overloaded. No indication for BiPAP or other respiratory support at this time. Troponin normal. Paged hospitalist for admission.   Discussed patient's case with hospitalist, Dr. Shanon Brow.  Recommend admission to obs, medicine bed.  I will place holding orders per their request. Patient and family (if present) updated with plan. Care transferred to hospitalist service.  I reviewed all nursing notes, vitals, pertinent old records, EKGs, labs, imaging (as available).  ____________________________________________  FINAL CLINICAL IMPRESSION(S) / ED DIAGNOSES  Final diagnoses:  Dyspnea  Bilateral edema of lower extremity     MEDICATIONS GIVEN DURING THIS VISIT:  Medications  folic acid (FOLVITE) tablet 1 mg (not administered)  nadolol (CORGARD) tablet 40 mg (40 mg Oral Not Given 01/04/16 0734)  pantoprazole (PROTONIX) EC tablet 40 mg (40 mg Oral Given 01/04/16 0800)  spironolactone (ALDACTONE) tablet 100 mg (not administered)  thiamine (VITAMIN B-1) tablet 100 mg (not administered)  lactulose (CHRONULAC) 10 GM/15ML solution 10 g (not administered)    sodium chloride flush (NS) 0.9 % injection 3 mL (3 mLs Intravenous Given 01/04/16 0735)  sodium chloride flush (NS) 0.9 % injection 3 mL (not administered)  0.9 %  sodium chloride infusion (not administered)  furosemide (LASIX) injection 40 mg (40 mg Intravenous Not Given 01/04/16 0734)  furosemide (LASIX) 10 MG/ML injection ( Intravenous Given During Downtime 01/04/16 0130)  furosemide (LASIX) 10 MG/ML injection (  Given During Downtime 01/04/16 0130)     NEW OUTPATIENT MEDICATIONS STARTED DURING THIS VISIT:  None   Note:  This document was prepared using Dragon voice recognition software and may include unintentional dictation errors.  Nanda Quinton, MD Emergency Medicine    Margette Fast, MD 01/04/16 [phone]

## 2016-01-03 NOTE — ED Triage Notes (Signed)
Pt states he was just discharged yesterday from hospital and states he started having sob yesterday

## 2016-01-03 NOTE — Telephone Encounter (Signed)
WIFE CALLED CONCERNED ABOUT FACE & Eye SWELLING GETTING WORSE. D/C ON TWO NEW MEDS. TAKING TWO NEW MEDS. Asked her to take him to ED to be evaluated. XIFAXAN IS TOO EXPENSIVE. MAY NEED PA. WILL CALL PT IN AM TO CHECK ON HIM & will investigate issues with XIFAXAN & hope to resolve cost ISSUE. CURRENTLY NOT CONFUSED.

## 2016-01-04 ENCOUNTER — Observation Stay (HOSPITAL_COMMUNITY): Payer: BLUE CROSS/BLUE SHIELD

## 2016-01-04 ENCOUNTER — Encounter (HOSPITAL_COMMUNITY): Payer: Self-pay | Admitting: *Deleted

## 2016-01-04 DIAGNOSIS — D649 Anemia, unspecified: Secondary | ICD-10-CM | POA: Diagnosis not present

## 2016-01-04 DIAGNOSIS — R609 Edema, unspecified: Secondary | ICD-10-CM | POA: Diagnosis not present

## 2016-01-04 DIAGNOSIS — R601 Generalized edema: Secondary | ICD-10-CM | POA: Diagnosis present

## 2016-01-04 DIAGNOSIS — K7011 Alcoholic hepatitis with ascites: Secondary | ICD-10-CM | POA: Diagnosis present

## 2016-01-04 DIAGNOSIS — E876 Hypokalemia: Secondary | ICD-10-CM | POA: Diagnosis not present

## 2016-01-04 DIAGNOSIS — D6959 Other secondary thrombocytopenia: Secondary | ICD-10-CM | POA: Diagnosis present

## 2016-01-04 DIAGNOSIS — N179 Acute kidney failure, unspecified: Secondary | ICD-10-CM | POA: Diagnosis not present

## 2016-01-04 DIAGNOSIS — K7290 Hepatic failure, unspecified without coma: Secondary | ICD-10-CM | POA: Diagnosis present

## 2016-01-04 DIAGNOSIS — D638 Anemia in other chronic diseases classified elsewhere: Secondary | ICD-10-CM | POA: Diagnosis present

## 2016-01-04 DIAGNOSIS — K219 Gastro-esophageal reflux disease without esophagitis: Secondary | ICD-10-CM | POA: Diagnosis present

## 2016-01-04 DIAGNOSIS — K766 Portal hypertension: Secondary | ICD-10-CM | POA: Diagnosis present

## 2016-01-04 DIAGNOSIS — R0600 Dyspnea, unspecified: Secondary | ICD-10-CM

## 2016-01-04 DIAGNOSIS — K3189 Other diseases of stomach and duodenum: Secondary | ICD-10-CM | POA: Diagnosis present

## 2016-01-04 DIAGNOSIS — Z6841 Body Mass Index (BMI) 40.0 and over, adult: Secondary | ICD-10-CM | POA: Diagnosis not present

## 2016-01-04 DIAGNOSIS — E871 Hypo-osmolality and hyponatremia: Secondary | ICD-10-CM | POA: Diagnosis not present

## 2016-01-04 DIAGNOSIS — F17210 Nicotine dependence, cigarettes, uncomplicated: Secondary | ICD-10-CM | POA: Diagnosis present

## 2016-01-04 DIAGNOSIS — D509 Iron deficiency anemia, unspecified: Secondary | ICD-10-CM | POA: Diagnosis present

## 2016-01-04 DIAGNOSIS — F1020 Alcohol dependence, uncomplicated: Secondary | ICD-10-CM | POA: Diagnosis present

## 2016-01-04 DIAGNOSIS — E2740 Unspecified adrenocortical insufficiency: Secondary | ICD-10-CM | POA: Diagnosis present

## 2016-01-04 DIAGNOSIS — Z7189 Other specified counseling: Secondary | ICD-10-CM | POA: Diagnosis not present

## 2016-01-04 DIAGNOSIS — Z515 Encounter for palliative care: Secondary | ICD-10-CM | POA: Diagnosis not present

## 2016-01-04 DIAGNOSIS — Z825 Family history of asthma and other chronic lower respiratory diseases: Secondary | ICD-10-CM | POA: Diagnosis not present

## 2016-01-04 DIAGNOSIS — D689 Coagulation defect, unspecified: Secondary | ICD-10-CM | POA: Diagnosis present

## 2016-01-04 DIAGNOSIS — I8510 Secondary esophageal varices without bleeding: Secondary | ICD-10-CM | POA: Diagnosis present

## 2016-01-04 DIAGNOSIS — R0609 Other forms of dyspnea: Secondary | ICD-10-CM | POA: Diagnosis not present

## 2016-01-04 DIAGNOSIS — T502X5A Adverse effect of carbonic-anhydrase inhibitors, benzothiadiazides and other diuretics, initial encounter: Secondary | ICD-10-CM | POA: Diagnosis not present

## 2016-01-04 DIAGNOSIS — I82621 Acute embolism and thrombosis of deep veins of right upper extremity: Secondary | ICD-10-CM | POA: Diagnosis present

## 2016-01-04 DIAGNOSIS — N170 Acute kidney failure with tubular necrosis: Secondary | ICD-10-CM | POA: Diagnosis not present

## 2016-01-04 DIAGNOSIS — R600 Localized edema: Secondary | ICD-10-CM | POA: Diagnosis not present

## 2016-01-04 DIAGNOSIS — K429 Umbilical hernia without obstruction or gangrene: Secondary | ICD-10-CM | POA: Diagnosis present

## 2016-01-04 DIAGNOSIS — K7031 Alcoholic cirrhosis of liver with ascites: Secondary | ICD-10-CM | POA: Diagnosis present

## 2016-01-04 DIAGNOSIS — F1010 Alcohol abuse, uncomplicated: Secondary | ICD-10-CM | POA: Diagnosis not present

## 2016-01-04 DIAGNOSIS — R040 Epistaxis: Secondary | ICD-10-CM | POA: Diagnosis not present

## 2016-01-04 LAB — CBC
HEMATOCRIT: 24.3 % — AB (ref 39.0–52.0)
Hemoglobin: 7.4 g/dL — ABNORMAL LOW (ref 13.0–17.0)
MCH: 30 pg (ref 26.0–34.0)
MCHC: 30.5 g/dL (ref 30.0–36.0)
MCV: 98.4 fL (ref 78.0–100.0)
PLATELETS: 116 10*3/uL — AB (ref 150–400)
RBC: 2.47 MIL/uL — ABNORMAL LOW (ref 4.22–5.81)
RDW: 18.5 % — AB (ref 11.5–15.5)
WBC: 13.8 10*3/uL — ABNORMAL HIGH (ref 4.0–10.5)

## 2016-01-04 LAB — CBC WITH DIFFERENTIAL/PLATELET
BASOS ABS: 0 10*3/uL (ref 0.0–0.1)
BASOS PCT: 0 %
EOS ABS: 0.2 10*3/uL (ref 0.0–0.7)
Eosinophils Relative: 1 %
HEMATOCRIT: 23.3 % — AB (ref 39.0–52.0)
Hemoglobin: 7.3 g/dL — ABNORMAL LOW (ref 13.0–17.0)
Lymphocytes Relative: 19 %
Lymphs Abs: 2.5 10*3/uL (ref 0.7–4.0)
MCH: 30.8 pg (ref 26.0–34.0)
MCHC: 31.3 g/dL (ref 30.0–36.0)
MCV: 98.3 fL (ref 78.0–100.0)
MONO ABS: 2.4 10*3/uL — AB (ref 0.1–1.0)
MONOS PCT: 18 %
NEUTROS ABS: 8.3 10*3/uL — AB (ref 1.7–7.7)
Neutrophils Relative %: 62 %
PLATELETS: 103 10*3/uL — AB (ref 150–400)
RBC: 2.37 MIL/uL — ABNORMAL LOW (ref 4.22–5.81)
RDW: 18.4 % — AB (ref 11.5–15.5)
WBC: 13.5 10*3/uL — ABNORMAL HIGH (ref 4.0–10.5)

## 2016-01-04 LAB — BASIC METABOLIC PANEL
Anion gap: 1 — ABNORMAL LOW (ref 5–15)
BUN: 21 mg/dL — AB (ref 6–20)
CHLORIDE: 107 mmol/L (ref 101–111)
CO2: 22 mmol/L (ref 22–32)
CREATININE: 1.02 mg/dL (ref 0.61–1.24)
Calcium: 7.9 mg/dL — ABNORMAL LOW (ref 8.9–10.3)
GFR calc Af Amer: >60 mL/min (ref >60)
GFR calc non Af Amer: >60 mL/min (ref >60)
GLUCOSE: 101 mg/dL — AB (ref 65–99)
Potassium: 4.4 mmol/L (ref 3.5–5.1)
SODIUM: 130 mmol/L — AB (ref 135–145)

## 2016-01-04 LAB — COMPREHENSIVE METABOLIC PANEL
ALBUMIN: 1.5 g/dL — AB (ref 3.5–5.0)
ALK PHOS: 165 U/L — AB (ref 38–126)
ALT: 40 U/L (ref 17–63)
ANION GAP: 0 — AB (ref 5–15)
AST: 72 U/L — ABNORMAL HIGH (ref 15–41)
BILIRUBIN TOTAL: 3.2 mg/dL — AB (ref 0.3–1.2)
BUN: 20 mg/dL (ref 6–20)
CALCIUM: 7.6 mg/dL — AB (ref 8.9–10.3)
CO2: 22 mmol/L (ref 22–32)
CREATININE: 1.03 mg/dL (ref 0.61–1.24)
Chloride: 108 mmol/L (ref 101–111)
GFR calc non Af Amer: >60 mL/min (ref >60)
GLUCOSE: 100 mg/dL — AB (ref 65–99)
Potassium: 5 mmol/L (ref 3.5–5.1)
Sodium: 130 mmol/L — ABNORMAL LOW (ref 135–145)
TOTAL PROTEIN: 5.9 g/dL — AB (ref 6.5–8.1)

## 2016-01-04 LAB — MAGNESIUM: Magnesium: 1.3 mg/dL — ABNORMAL LOW (ref 1.7–2.4)

## 2016-01-04 LAB — PROTIME-INR
INR: 2.1
Prothrombin Time: 23.9 seconds — ABNORMAL HIGH (ref 11.4–15.2)

## 2016-01-04 LAB — I-STAT TROPONIN, ED: Troponin i, poc: 0.01 ng/mL (ref 0.00–0.08)

## 2016-01-04 LAB — BRAIN NATRIURETIC PEPTIDE: B Natriuretic Peptide: 274 pg/mL — ABNORMAL HIGH (ref 0.0–100.0)

## 2016-01-04 MED ORDER — FOLIC ACID 1 MG PO TABS
1.0000 mg | ORAL_TABLET | Freq: Every day | ORAL | Status: DC
  Administered 2016-01-04 – 2016-01-17 (×14): 1 mg via ORAL
  Filled 2016-01-04 (×14): qty 1

## 2016-01-04 MED ORDER — FUROSEMIDE 10 MG/ML IJ SOLN: 60.0000 mg | Freq: Once | INTRAMUSCULAR | Status: DC

## 2016-01-04 MED ORDER — FUROSEMIDE 10 MG/ML IJ SOLN
INTRAMUSCULAR | Status: AC
  Administered 2016-01-04: 02:00:00 via INTRAVENOUS
  Filled 2016-01-04: qty 2

## 2016-01-04 MED ORDER — FUROSEMIDE 10 MG/ML IJ SOLN
40.0000 mg | Freq: Two times a day (BID) | INTRAMUSCULAR | Status: DC
  Administered 2016-01-04 – 2016-01-05 (×3): 40 mg via INTRAVENOUS
  Filled 2016-01-04 (×3): qty 4

## 2016-01-04 MED ORDER — ZOLPIDEM TARTRATE 5 MG PO TABS
5.0000 mg | ORAL_TABLET | Freq: Every evening | ORAL | Status: DC | PRN
  Administered 2016-01-04: 5 mg via ORAL
  Filled 2016-01-04 (×2): qty 1

## 2016-01-04 MED ORDER — SODIUM CHLORIDE 0.9 % IV SOLN
250.0000 mL | INTRAVENOUS | Status: DC | PRN
  Administered 2016-01-08: 250 mL via INTRAVENOUS

## 2016-01-04 MED ORDER — SPIRONOLACTONE 25 MG PO TABS
100.0000 mg | ORAL_TABLET | Freq: Every day | ORAL | Status: DC
  Administered 2016-01-04 – 2016-01-08 (×5): 100 mg via ORAL
  Filled 2016-01-04 (×7): qty 4

## 2016-01-04 MED ORDER — SODIUM CHLORIDE 0.9% FLUSH: 3.0000 mL | INTRAVENOUS | Status: DC | PRN

## 2016-01-04 MED ORDER — VITAMIN B-1 100 MG PO TABS
100.0000 mg | ORAL_TABLET | Freq: Every day | ORAL | Status: DC
  Administered 2016-01-04 – 2016-01-17 (×15): 100 mg via ORAL
  Filled 2016-01-04 (×26): qty 1

## 2016-01-04 MED ORDER — PANTOPRAZOLE SODIUM 40 MG PO TBEC
40.0000 mg | DELAYED_RELEASE_TABLET | Freq: Two times a day (BID) | ORAL | Status: DC
  Administered 2016-01-04 – 2016-01-17 (×25): 40 mg via ORAL
  Filled 2016-01-04 (×25): qty 1

## 2016-01-04 MED ORDER — FUROSEMIDE 10 MG/ML IJ SOLN
INTRAMUSCULAR | Status: AC
  Administered 2016-01-04: 02:00:00
  Filled 2016-01-04: qty 4

## 2016-01-04 MED ORDER — SODIUM CHLORIDE 0.9% FLUSH
3.0000 mL | Freq: Two times a day (BID) | INTRAVENOUS | Status: DC
  Administered 2016-01-04 – 2016-01-16 (×22): 3 mL via INTRAVENOUS

## 2016-01-04 MED ORDER — LACTULOSE 10 GM/15ML PO SOLN: 10.0000 g | Freq: Three times a day (TID) | ORAL | Status: DC | PRN

## 2016-01-04 MED ORDER — NADOLOL 40 MG PO TABS
40.0000 mg | ORAL_TABLET | Freq: Two times a day (BID) | ORAL | Status: DC
  Administered 2016-01-04 – 2016-01-17 (×27): 40 mg via ORAL
  Filled 2016-01-04 (×30): qty 1

## 2016-01-04 NOTE — Progress Notes (Signed)
*  PRELIMINARY RESULTS* Echocardiogram 2D Echocardiogram has been performed.  Antonio [name] 01/04/2016, 5:28 PM

## 2016-01-04 NOTE — Telephone Encounter (Signed)
Called, many rings and no answer.

## 2016-01-04 NOTE — Progress Notes (Signed)
Seen and examined. Admitted after midnight secondary to worsening anasarca. Patient with hx of liver cirrhosis.  Denies CP, nausea, vomiting and hematemesis. Hemodynamically stable and with complaints of just mild SOB. On physical exam positive ascitis, but not as bad as previously described and prior admissions. Patient didn't present pain in his abdomen on exam and is afebrile. Please refer to H&P written by Dr. Shanon [name] for further info/details on admission.  Plan: -will continue IV diuresis and spironolactone -daily weight -strict I's and O's -follow 2-D echo -replete electrolytes and follow renal functions as needed  Antonio [name] [id number]

## 2016-01-04 NOTE — Telephone Encounter (Signed)
Pt admitted to [facility] last night.

## 2016-01-04 NOTE — H&P (Signed)
History and Physical    Antonio [name] [id number] DOB: 1970/08/05 DOA: 01/03/2016  PCP: Deloria Lair, MD  Patient coming from: home  Chief Complaint:   Sob, swelling  HPI: Antonio [name] is a 45 y.o. male with medical history significant of alcoholic cirrhosis of the liver, recent GIB requiring 4 units prbcs transfusion was discharged home in the last couple of days and reports since then he has progressively had more swelling in his legs with associated shortness of breath.  Denies any fevers or chest pain.  He says he is taking his medications.  He denies drinking, has not had any alcohol in about 3 weeks.  Pt referred for admission for volume overload, vitals are stable, oxygen sats are normal on RA.  Review of Systems: As per HPI otherwise 10 point review of systems negative.   Past Medical History:  Diagnosis Date  . Alcohol abuse   . Alcoholic hepatitis   . Cirrhosis with alcoholism [facility])     Past Surgical History:  Procedure Laterality Date  . Colonoscopy     per patient around 2014 in [place]      reports that he has been smoking Cigarettes.  He has been smoking about 0.25 packs per day. He has never used smokeless tobacco. He reports that he drinks about 3.6 oz of alcohol per week . He reports that he does not use drugs.  No Known Allergies  Family History  Problem Relation Age of Onset  . Colon cancer Neg Hx   . Liver disease Neg Hx     Prior to Admission medications   Medication Sig Start Date End Date Taking? Authorizing Provider  Calcium Carb-Cholecalciferol (CALCIUM 600+D) 600-800 MG-UNIT TABS Take 1 tablet by mouth daily.   Yes Historical Provider, MD  folic acid (FOLVITE) 1 MG tablet Take 1 tablet (1 mg total) by mouth daily. 12/31/15  Yes Orvan Falconer, MD  lactulose (CHRONULAC) 10 GM/15ML solution Take 15 mLs (10 g total) by mouth 3 (three) times daily as needed for mild constipation. 12/31/15  Yes Orvan Falconer, MD  nadolol (CORGARD) 40 MG tablet Take 1 tablet (40 mg  total) by mouth 2 (two) times daily. 12/31/15  Yes Orvan Falconer, MD  pantoprazole (PROTONIX) 40 MG tablet Take 1 tablet (40 mg total) by mouth 2 (two) times daily before a meal. 12/31/15  Yes Orvan Falconer, MD  potassium chloride SA (K-DUR,KLOR-CON) 20 MEQ tablet Take 20 mEq by mouth 2 (two) times daily.   Yes Historical Provider, MD  prednisoLONE (ORAPRED) 15 MG/5ML solution Take 13.3 mLs (40 mg total) by mouth daily before breakfast. 12/31/15  Yes Orvan Falconer, MD  spironolactone (ALDACTONE) 100 MG tablet Take 100 mg by mouth daily.   Yes Historical Provider, MD  thiamine 100 MG tablet Take 1 tablet (100 mg total) by mouth daily. 12/31/15  Yes Orvan Falconer, MD  rifaximin (XIFAXAN) 550 MG TABS tablet Take 1 tablet (550 mg total) by mouth 2 (two) times daily. 12/31/15   Orvan Falconer, MD    Physical Exam: Vitals:   01/03/16 2044 01/04/16 0131  BP: 120/65 (!) 99/49  Pulse: 67 74  Resp: 22 22  Temp: 97.7 F (36.5 C)   TempSrc: Oral   SpO2: 100% 100%  Weight: (!) 161.9 kg (357 lb)   Height: 6\' 2"  (1.88 m)       Constitutional: NAD, calm, comfortable Vitals:   01/03/16 2044 01/04/16 0131  BP: 120/65 (!) 99/49  Pulse: 67 74  Resp: 22  22  Temp: 97.7 F (36.5 C)   TempSrc: Oral   SpO2: 100% 100%  Weight: (!) 161.9 kg (357 lb)   Height: 6\' 2"  (1.88 m)    Eyes: PERRL, lids and conjunctivae normal ENMT: Mucous membranes are moist. Posterior pharynx clear of any exudate or lesions.Normal dentition.  Neck: normal, supple, no masses, no thyromegaly Respiratory: clear to auscultation bilaterally, no wheezing, no crackles. Normal respiratory effort. No accessory muscle use.  Cardiovascular: Regular rate and rhythm, no murmurs / rubs / gallops. 4+ extremity edema up to thighs. 2+ pedal pulses. No carotid bruits.  Abdomen: no tenderness, no masses palpated. No hepatosplenomegaly. Bowel sounds positive.  Musculoskeletal: no clubbing / cyanosis. No joint deformity upper and lower extremities. Good ROM, no contractures.  Normal muscle tone.  Skin: no rashes, lesions, ulcers. No induration Neurologic: CN 2-12 grossly intact. Sensation intact, DTR normal. Strength 5/5 in all 4.  Psychiatric: Normal judgment and insight. Alert and oriented x 3. Normal mood.    Labs on Admission: I have personally reviewed following labs and imaging studies  CBC:  Recent Labs Lab 12/28/15 0307 12/28/15 0951 12/29/15 0616 12/31/15 0555 01/03/16 2200  WBC 8.9  --   --  13.8* 13.5*  NEUTROABS  --   --   --  8.2* 8.3*  HGB 6.8* 7.7* 8.0* 7.4* 7.3*  HCT 20.2* 23.2* 24.2* 23.7* 23.3*  MCV 94.8  --   --  97.9 98.3  PLT 93*  --   --  106* [id number]*   Basic Metabolic Panel:  Recent Labs Lab 12/28/15 0307 12/30/15 0559 12/31/15 0555 01/03/16 2200  NA 126* 136 131* 130*  K 4.6 2.8* 4.4 5.0  CL 106 118* 104 108  CO2 21* 17* 22 22  GLUCOSE 128* 79 105* 100*  BUN 18 13 17 20   CREATININE 1.46* 0.60* 0.97 1.03  CALCIUM 7.3* 5.3* 7.5* 7.6*  MG  --  0.9*  --   --    GFR: Estimated Creatinine Clearance: 149.2 mL/min (by C-G formula based on SCr of 1.03 mg/dL). Liver Function Tests:  Recent Labs Lab 12/28/15 0307 12/30/15 0559 12/31/15 0555 01/03/16 2200  AST 74* 45* 66* 72*  ALT 32 22 34 40  ALKPHOS 108 122 203* 165*  BILITOT 6.0* 2.6* 3.4* 3.2*  PROT 6.0* 4.2* 5.7* 5.9*  ALBUMIN 1.5* 1.0* 1.4* 1.5*   No results for input(s): LIPASE, AMYLASE in the last 168 hours.  Recent Labs Lab 12/28/15 0951 12/31/15 0555  AMMONIA 113* 47*   Coagulation Profile:  Recent Labs Lab 12/28/15 0951 12/30/15 0559 12/31/15 0555 01/03/16 2200  INR 2.46 2.20 2.35 2.10   Urine analysis:    Component Value Date/Time   COLORURINE AMBER (A) 12/27/2015 2229   APPEARANCEUR CLEAR 12/27/2015 2229   LABSPEC 1.010 12/27/2015 2229   PHURINE 6.0 12/27/2015 2229   GLUCOSEU NEGATIVE 12/27/2015 2229   HGBUR LARGE (A) 12/27/2015 2229   BILIRUBINUR MODERATE (A) 12/27/2015 2229   KETONESUR NEGATIVE 12/27/2015 2229   PROTEINUR TRACE  (A) 12/27/2015 2229   NITRITE NEGATIVE 12/27/2015 2229   LEUKOCYTESUR NEGATIVE 12/27/2015 2229    Recent Results (from the past 240 hour(s))  MRSA PCR Screening     Status: None   Collection Time: 12/27/15  2:36 AM  Result Value Ref Range Status   MRSA by PCR NEGATIVE NEGATIVE Final    Comment:        The GeneXpert MRSA Assay (FDA approved for NASAL specimens only), is one component of  a comprehensive MRSA colonization surveillance program. It is not intended to diagnose MRSA infection nor to guide or monitor treatment for MRSA infections.      Radiological Exams on Admission: Dg Chest 2 View  Result Date: 01/03/2016 CLINICAL DATA:  45 y/o male here with c/o SOB, cough x2 days. Leg edema also noted by pt. Denies hx asthma, COPD, MI, HTN, DM, etc etc. Non-smoker EXAM: CHEST  2 VIEW COMPARISON:  12/26/2015 FINDINGS: Heart size is upper normal. There are no focal consolidations or pleural effusions. No pulmonary edema. Visualized osseous structures have a normal appearance. IMPRESSION: No evidence for acute cardiopulmonary abnormality. Electronically Signed   By: Nolon Nations M.D.   On: 01/03/2016 22:03    EKG: Independently reviewed. nsr  Assessment/Plan 46 yo male with advanced alcoholic cirrhosis of the liver comes in with anasarca  Principal Problem:   Decompensation of cirrhosis of liver ([place])-  Cont spirinolactone and add lasix 40mg  iv q 12 hours.  Given 60mg  lasix iv in the ED and has diuresed about a liter already.    Active Problems:   Anemia, unspecified- no overt bleeding at this time.  hgb around 8.   Alcohol abuse- 3 weeks sober per his report   DOE (dyspnea on exertion)- due to cirrhosis likely, but will check cardiac echocardiogram   Peripheral edema- noted   obs on medical bed.   DVT prophylaxis:  scds Code Status:   Full code  Sheletha Bow A MD [facility]  If 7PM-7AM, please contact night-coverage [email or web address] Password [email or web address]  01/04/2016,  1:42 AM

## 2016-01-04 NOTE — Care Management Note (Signed)
Case Management Note  Patient Details  Name: Brackston Marciante MRN: [id number] Date of Birth: 1970-08-18  Subjective/Objective:  Patient is from home with wife. He is ind with ADL's. He has a PCP and insurance, reports no issues.                 Action/Plan: Anticipate DC home with self care.   Expected Discharge Date:  01/07/16               Expected Discharge Plan:  Home/Self Care  In-House Referral:  NA  Discharge planning Services  CM Consult  Post Acute Care Choice:  NA Choice offered to:  NA  DME Arranged:    DME Agency:     HH Arranged:    HH Agency:     Status of Service:  Completed, signed off  If discussed at [employer] of Stay Meetings, dates discussed:    Additional Comments:  Derryl Uher, Chauncey Reading, RN 01/04/2016, 2:05 PM

## 2016-01-05 ENCOUNTER — Encounter (HOSPITAL_COMMUNITY): Payer: Self-pay | Admitting: Gastroenterology

## 2016-01-05 LAB — ECHOCARDIOGRAM COMPLETE
AO mean calculated velocity dopler: 116 cm/s
AOPV: 0.9 m/s
AOVTI: 41.7 cm
AV Area VTI: 3.74 cm2
AV Mean grad: 7 mmHg
AV area mean vel ind: 1.32 cm2/m2
AV vel: 3.9
AVA: 3.9 cm2
AVAREAMEANV: 4.11 cm2
AVAREAVTIIND: 1.25 cm2/m2
AVCELMEANRAT: 0.99
AVLVOTPG: 12 mmHg
AVPG: 14 mmHg
AVPKVEL: 190 cm/s
CHL CUP AV PEAK INDEX: 1.2
CHL CUP AV VALUE AREA INDEX: 1.25
EERAT: 12.36
EWDT: 261 ms
FS: 34 % (ref 28–44)
Height: 74 in
IVS/LV PW RATIO, ED: 1.02
LA diam end sys: 54 mm
LA diam index: 1.73 cm/m2
LASIZE: 54 mm
LAVOL: 102 mL
LAVOLA4C: 128 mL
LAVOLIN: 32.7 mL/m2
LDCA: 4.15 cm2
LV E/e' medial: 12.36
LV PW d: 13.1 mm — AB (ref 0.6–1.1)
LV SIMPSON'S DISK: 60
LV TDI E'LATERAL: 12.7
LV dias vol index: 38 mL/m2
LV e' LATERAL: 12.7 cm/s
LV sys vol: 48 mL (ref 21–61)
LVDIAVOL: 119 mL (ref 62–150)
LVEEAVG: 12.36
LVOT SV: 163 mL
LVOT VTI: 39.2 cm
LVOT diameter: 23 mm
LVOT peak VTI: 0.94 cm
LVOTPV: 171 cm/s
LVSYSVOLIN: 15 mL/m2
MV Dec: 261
MV pk E vel: 157 m/s
MVPG: 10 mmHg
MVPKAVEL: 80.2 m/s
RV LATERAL S' VELOCITY: 18.8 cm/s
RV TAPSE: 40.5 mm
Stroke v: 71 ml
TDI e' medial: 9.25
Weight: 6195.2 oz

## 2016-01-05 MED ORDER — FUROSEMIDE 10 MG/ML IJ SOLN
60.0000 mg | Freq: Two times a day (BID) | INTRAMUSCULAR | Status: DC
  Administered 2016-01-06 – 2016-01-08 (×5): 60 mg via INTRAVENOUS
  Filled 2016-01-05 (×6): qty 6

## 2016-01-05 MED ORDER — RIFAXIMIN 550 MG PO TABS
550.0000 mg | ORAL_TABLET | Freq: Two times a day (BID) | ORAL | Status: DC
Start: 2016-01-05 — End: 2016-01-14
  Administered 2016-01-05 – 2016-01-14 (×16): 550 mg via ORAL
  Filled 2016-01-05 (×16): qty 1

## 2016-01-05 NOTE — Progress Notes (Addendum)
[facility] PROGRESS NOTE  Antonio [name] [id number] DOB: 1970-09-22 DOA: 01/03/2016 PCP: Deloria Lair, MD  Interim summary and HPI 45 y.o. male with medical history significant of alcoholic cirrhosis of the liver, recent GIB requiring 4 units prbcs transfusion was discharged home in the last couple of days and reports since then he has progressively had more swelling in his legs with associated shortness of breath.  Denies any fevers or chest pain.  He says he is taking his medications.  He denies drinking, has not had any alcohol in about 3 weeks.  Pt referred for admission for volume overload, vitals are stable, oxygen sats are normal on RA.  Assessment/Plan: 1-cirrhosis: with decompensation and fluid overload -slowly responding to diuresis  -will continue spironolactone -continue lasix; but will increase to 60mg  BID -low sodium diet -daily weights and strict intake and output  2-cirrhosis: due to alcohol  -will continue nadolol -PRN chronulac  -will start xifaxan -continue thiamine   3-hx of alcohol abuse  -has remain clean for > 3 weeks now -will continue thiamine   4-GERD: -will continue protonix  5-Morbid obesity: -Body mass index is 49.71 kg/m. -low calorie diet and increase exercise   6- anemia of chronic disease: with associated presumed bone marrow suppression from alcohol hx. -Hgb stable -no signs of overt bleeding -will monitor trend  Code Status: Full Family Communication: no family at bedside  Disposition Plan: continue IV diuresis, follow electrolytes closely, patient still with significant fluid overload.   Consultants:  None   Procedures:  See below for x-ray reports   Antibiotics:  None   HPI/Subjective: Afebrile, no CP, very mild SOB. No nausea, no vomiting.   Objective: Vitals:   01/05/16 0841 01/05/16 1300  BP: (!) 120/55 125/69  Pulse:  71  Resp:  18  Temp:  97.5 F (36.4 C)    Intake/Output Summary (Last 24 hours) at  01/05/16 1607 Last data filed at 01/05/16 1521  Gross per 24 hour  Intake              960 ml  Output             1900 ml  Net             -940 ml   Filed Weights   01/03/16 2044 01/04/16 0226  Weight: (!) 161.9 kg (357 lb) (!) 175.6 kg (387 lb 3.2 oz)    Exam:   General:  Obese, with signs of fluid overload, mild SOB, but able to speak in full sentences and with good O2 sat on RA.  Cardiovascular: S1 and S2, no rubs or gallops  Respiratory: fair air movement, no crackles, no wheezing   Abdomen: obese, with positive wave signs, no tender, positive BS  Musculoskeletal: 3++ edema bilaterally. No cyanosis   Data Reviewed: Basic Metabolic Panel:  Recent Labs Lab 12/30/15 0559 12/31/15 0555 01/03/16 2200 01/04/16 0308  NA 136 131* 130* 130*  K 2.8* 4.4 5.0 4.4  CL 118* 104 108 107  CO2 17* 22 22 22   GLUCOSE 79 105* 100* 101*  BUN 13 17 20  21*  CREATININE 0.60* 0.97 1.03 1.02  CALCIUM 5.3* 7.5* 7.6* 7.9*  MG 0.9*  --   --  1.3*   Liver Function Tests:  Recent Labs Lab 12/30/15 0559 12/31/15 0555 01/03/16 2200  AST 45* 66* 72*  ALT 22 34 40  ALKPHOS 122 203* 165*  BILITOT 2.6* 3.4* 3.2*  PROT 4.2* 5.7* 5.9*  ALBUMIN 1.0* 1.4*  1.5*    Recent Labs Lab 12/31/15 0555  AMMONIA 47*   CBC:  Recent Labs Lab 12/31/15 0555 01/03/16 2200 01/04/16 0308  WBC 13.8* 13.5* 13.8*  NEUTROABS 8.2* 8.3*  --   HGB 7.4* 7.3* 7.4*  HCT 23.7* 23.3* 24.3*  MCV 97.9 98.3 98.4  PLT 106* 103* 116*   BNP (last 3 results)  Recent Labs  12/26/15 2230 01/03/16 2200  BNP 30.0 274.0*   CBG: No results for input(s): GLUCAP in the last 168 hours.  Recent Results (from the past 240 hour(s))  MRSA PCR Screening     Status: None   Collection Time: 12/27/15  2:36 AM  Result Value Ref Range Status   MRSA by PCR NEGATIVE NEGATIVE Final    Comment:        The GeneXpert MRSA Assay (FDA approved for NASAL specimens only), is one component of a comprehensive MRSA  colonization surveillance program. It is not intended to diagnose MRSA infection nor to guide or monitor treatment for MRSA infections.      Studies: Dg Chest 2 View  Result Date: 01/03/2016 CLINICAL DATA:  45 y/o male here with c/o SOB, cough x2 days. Leg edema also noted by pt. Denies hx asthma, COPD, MI, HTN, DM, etc etc. Non-smoker EXAM: CHEST  2 VIEW COMPARISON:  12/26/2015 FINDINGS: Heart size is upper normal. There are no focal consolidations or pleural effusions. No pulmonary edema. Visualized osseous structures have a normal appearance. IMPRESSION: No evidence for acute cardiopulmonary abnormality. Electronically Signed   By: Nolon Nations M.D.   On: 01/03/2016 22:03    Scheduled Meds: . folic acid  1 mg Oral Daily  . [START ON 01/06/2016] furosemide  60 mg Intravenous Q12H  . nadolol  40 mg Oral BID  . pantoprazole  40 mg Oral BID AC  . sodium chloride flush  3 mL Intravenous Q12H  . spironolactone  100 mg Oral Daily  . thiamine  100 mg Oral Daily     Time spent: 30 minutes   [name]  [facility] Pager [phone]. If 7PM-7AM, please contact night-coverage at [email or web address], password [facility] 01/05/2016, 4:07 PM  LOS: 1 day

## 2016-01-06 ENCOUNTER — Encounter: Payer: Self-pay | Admitting: Gastroenterology

## 2016-01-06 LAB — BASIC METABOLIC PANEL
ANION GAP: 8 (ref 5–15)
BUN: 25 mg/dL — AB (ref 6–20)
CHLORIDE: 102 mmol/L (ref 101–111)
CO2: 23 mmol/L (ref 22–32)
Calcium: 8.1 mg/dL — ABNORMAL LOW (ref 8.9–10.3)
Creatinine, Ser: 1.2 mg/dL (ref 0.61–1.24)
Glucose, Bld: 94 mg/dL (ref 65–99)
POTASSIUM: 3.8 mmol/L (ref 3.5–5.1)
SODIUM: 133 mmol/L — AB (ref 135–145)

## 2016-01-06 MED ORDER — ALBUMIN HUMAN 25 % IV SOLN
25.0000 g | Freq: Four times a day (QID) | INTRAVENOUS | Status: DC
  Administered 2016-01-06 – 2016-01-08 (×6): 25 g via INTRAVENOUS
  Filled 2016-01-06 (×8): qty 100

## 2016-01-06 NOTE — Progress Notes (Signed)
PROGRESS NOTE    Antonio [name]  [id number] DOB: 09-Jul-1970 DOA: 01/03/2016 PCP: Deloria Lair, MD  Outpatient Specialists: None  Brief Narrative: 44 yom with hx of alcoholic cirrhosis of liver, alcohol abuse, GERD, morbid obesity, and anemia of chronic disease, was recently discharged a few days ago for a GI bleed after receiving 2 units of PRBCs. He presents today with complaints of progressively worsened swelling in his legs and SOB. While in the ED, he was noted to have volume overload. Pt was readmitted for further management of his swelling and SOB. EKG showed sinus or ectopic atrial rhythm. ECHO showed EF of 60-65%. He will likely need a few more days of treatments.  Assessment & Plan:   Principal Problem:   Decompensation of cirrhosis of liver (HCC) Active Problems:   Anemia, unspecified   Alcohol abuse   Alcoholic cirrhosis of liver with ascites (HCC)   DOE (dyspnea on exertion)   Peripheral edema   Anasarca   1. Decompensation of cirrhosis of liver. Patient continues to have evidence of massive volume overload. He is on IV lasix with unimpressive urine output. Albumin is 1.5. Will start on albumin infusions to help with efficacy of lasix. Continue aldactone. Continue to monitor input and output. Continue low sodium diet 2. Alcoholic cirrhosis. Continue nadolol. Started on xifaxan 3. Alcohol abuse. Has been alcohol free for 3 weeks. No signs of alcohol withdrawal at this time. Continue thiamine.  4. GERD. Continue PPI 5. Morbid obesity. 6. Anemia of chronic disease. Hgb is currently stable. No evidence of bleeding. Continue to monitor CBC.   DVT prophylaxis: SCDs Code Status: Full Family Communication: no family present Disposition Plan: Discharge once improved.   Consultants:   None  Procedures:  ECHO Study Conclusions  - Left ventricle: The cavity size was normal. Wall thickness was   increased in a pattern of moderate LVH. Systolic function was   normal.  The estimated ejection fraction was in the range of 60%   to 65%. Diastolic function is abnormal, indeterminate grade. Wall   motion was normal; there were no regional wall motion   abnormalities. - Aortic valve: Valve area (VTI): 3.9 cm^2. Valve area (Vmax): 3.74   cm^2. Valve area (Vmean): 4.11 cm^2. - Left atrium: The atrium was mildly dilated. - Technically adequate study.  Antimicrobials:  None   Subjective: Feels uncomfortable due to swelling in legs. No chest pain  Objective: Vitals:   01/05/16 1300 01/05/16 2058 01/05/16 2234 01/06/16 0500  BP: 125/69  119/80 119/62  Pulse: 71  70 67  Resp: 18  16 20   Temp: 97.5 F (36.4 C)  97.6 F (36.4 C) 97.7 F (36.5 C)  TempSrc: Oral  Oral Oral  SpO2: 100% 100% 100% 100%  Weight:    (!) 177.1 kg (390 lb 7 oz)  Height:        Intake/Output Summary (Last 24 hours) at 01/06/16 0733 Last data filed at 01/06/16 0454  Gross per 24 hour  Intake              960 ml  Output             1800 ml  Net             -840 ml   Filed Weights   01/03/16 2044 01/04/16 0226 01/06/16 0500  Weight: (!) 161.9 kg (357 lb) (!) 175.6 kg (387 lb 3.2 oz) (!) 177.1 kg (390 lb 7 oz)    Examination:  General exam:  Appears calm and comfortable  Respiratory system: Clear to auscultation. Respiratory effort normal. Cardiovascular system: S1 & S2 heard, RRR. No JVD, murmurs, rubs, gallops or clicks. 2-3+ pedal edema. Gastrointestinal system: Abdomen is nondistended, soft and nontender. No organomegaly or masses felt. Normal bowel sounds heard. Central nervous system: Alert and oriented. No focal neurological deficits. Extremities: Symmetric 5 x 5 power. Skin: No rashes, lesions or ulcers Psychiatry: Judgement and insight appear normal. Mood & affect appropriate.     Data Reviewed: I have personally reviewed following labs and imaging studies  CBC:  Recent Labs Lab 12/31/15 0555 01/03/16 2200 01/04/16 0308  WBC 13.8* 13.5* 13.8*    NEUTROABS 8.2* 8.3*  --   HGB 7.4* 7.3* 7.4*  HCT 23.7* 23.3* 24.3*  MCV 97.9 98.3 98.4  PLT 106* 103* [id number]*   Basic Metabolic Panel:  Recent Labs Lab 12/31/15 0555 01/03/16 2200 01/04/16 0308 01/06/16 0638  NA 131* 130* 130* 133*  K 4.4 5.0 4.4 3.8  CL 104 108 107 102  CO2 22 22 22 23   GLUCOSE 105* 100* 101* 94  BUN 17 20 21* 25*  CREATININE 0.97 1.03 1.02 1.20  CALCIUM 7.5* 7.6* 7.9* 8.1*  MG  --   --  1.3*  --    GFR: Estimated Creatinine Clearance: 134.9 mL/min (by C-G formula based on SCr of 1.2 mg/dL). Liver Function Tests:  Recent Labs Lab 12/31/15 0555 01/03/16 2200  AST 66* 72*  ALT 34 40  ALKPHOS 203* 165*  BILITOT 3.4* 3.2*  PROT 5.7* 5.9*  ALBUMIN 1.4* 1.5*   No results for input(s): LIPASE, AMYLASE in the last 168 hours.  Recent Labs Lab 12/31/15 0555  AMMONIA 47*   Coagulation Profile:  Recent Labs Lab 12/31/15 0555 01/03/16 2200  INR 2.35 2.10   Cardiac Enzymes: No results for input(s): CKTOTAL, CKMB, CKMBINDEX, TROPONINI in the last 168 hours. BNP (last 3 results) No results for input(s): PROBNP in the last 8760 hours. HbA1C: No results for input(s): HGBA1C in the last 72 hours. CBG: No results for input(s): GLUCAP in the last 168 hours. Lipid Profile: No results for input(s): CHOL, HDL, LDLCALC, TRIG, CHOLHDL, LDLDIRECT in the last 72 hours. Thyroid Function Tests: No results for input(s): TSH, T4TOTAL, FREET4, T3FREE, THYROIDAB in the last 72 hours. Anemia Panel: No results for input(s): VITAMINB12, FOLATE, FERRITIN, TIBC, IRON, RETICCTPCT in the last 72 hours. Urine analysis:    Component Value Date/Time   COLORURINE AMBER (A) 12/27/2015 2229   APPEARANCEUR CLEAR 12/27/2015 2229   LABSPEC 1.010 12/27/2015 2229   PHURINE 6.0 12/27/2015 2229   GLUCOSEU NEGATIVE 12/27/2015 2229   HGBUR LARGE (A) 12/27/2015 2229   BILIRUBINUR MODERATE (A) 12/27/2015 2229   KETONESUR NEGATIVE 12/27/2015 2229   PROTEINUR TRACE (A) 12/27/2015  2229   NITRITE NEGATIVE 12/27/2015 2229   LEUKOCYTESUR NEGATIVE 12/27/2015 2229   Sepsis Labs: @LABRCNTIP (procalcitonin:4,lacticidven:4)  )No results found for this or any previous visit (from the past 240 hour(s)).    Radiology Studies: No results found.    Scheduled Meds: . folic acid  1 mg Oral Daily  . furosemide  60 mg Intravenous Q12H  . nadolol  40 mg Oral BID  . pantoprazole  40 mg Oral BID AC  . rifaximin  550 mg Oral BID  . sodium chloride flush  3 mL Intravenous Q12H  . spironolactone  100 mg Oral Daily  . thiamine  100 mg Oral Daily   Continuous Infusions:    LOS: 2 days  Time spent: 25 minutes    Kathie Dike, MD [facility] If 7PM-7AM, please contact night-coverage [email or web address] Password [email or web address] 01/06/2016, 7:33 AM

## 2016-01-07 LAB — BASIC METABOLIC PANEL
Anion gap: 3 — ABNORMAL LOW (ref 5–15)
BUN: 27 mg/dL — AB (ref 6–20)
CALCIUM: 7.5 mg/dL — AB (ref 8.9–10.3)
CO2: 25 mmol/L (ref 22–32)
Chloride: 103 mmol/L (ref 101–111)
Creatinine, Ser: 1.26 mg/dL — ABNORMAL HIGH (ref 0.61–1.24)
GFR calc Af Amer: >60 mL/min (ref >60)
GLUCOSE: 111 mg/dL — AB (ref 65–99)
POTASSIUM: 3.8 mmol/L (ref 3.5–5.1)
Sodium: 131 mmol/L — ABNORMAL LOW (ref 135–145)

## 2016-01-07 LAB — PREPARE RBC (CROSSMATCH)

## 2016-01-07 LAB — CBC
HEMATOCRIT: 21.1 % — AB (ref 39.0–52.0)
HEMOGLOBIN: 6.6 g/dL — AB (ref 13.0–17.0)
MCH: 29.7 pg (ref 26.0–34.0)
MCHC: 31.3 g/dL (ref 30.0–36.0)
MCV: 95 fL (ref 78.0–100.0)
Platelets: 109 10*3/uL — ABNORMAL LOW (ref 150–400)
RBC: 2.22 MIL/uL — ABNORMAL LOW (ref 4.22–5.81)
RDW: 18.2 % — AB (ref 11.5–15.5)
WBC: 14.7 10*3/uL — ABNORMAL HIGH (ref 4.0–10.5)

## 2016-01-07 MED ORDER — SODIUM CHLORIDE 0.9 % IV SOLN: Freq: Once | INTRAVENOUS | Status: DC

## 2016-01-07 MED ORDER — METOLAZONE 5 MG PO TABS
5.0000 mg | ORAL_TABLET | Freq: Every day | ORAL | Status: DC
  Administered 2016-01-07 – 2016-01-08 (×2): 5 mg via ORAL
  Filled 2016-01-07 (×3): qty 1

## 2016-01-07 NOTE — Progress Notes (Signed)
Notified Dr. Roderic Palau of the patients infiltration in the left arm with his IV, and that it had to be removed.  Voiced to the MD that the patient is a hard stick.  After several IV replacement attempts by myself and Marlise Eves gerald the nurse supervisor, the patient requested a PICC line.

## 2016-01-07 NOTE — Progress Notes (Signed)
PROGRESS NOTE    Antonio [name]  [id number] DOB: 02/02/1971 DOA: 01/03/2016 PCP: Deloria Lair, MD  Outpatient Specialists: None  Brief Narrative: 77 yom with hx of alcoholic cirrhosis of liver, alcohol abuse, GERD, morbid obesity, and anemia of chronic disease, was recently discharged a few days ago for a GI bleed after receiving 2 units of PRBCs. He presents today with complaints of progressively worsened swelling in his legs and SOB. While in the ED, he was noted to have volume overload. Pt was readmitted for further management of his swelling and SOB. EKG showed sinus or ectopic atrial rhythm. ECHO showed EF of 60-65%. He was noted to have decompensation of cirrhosis of the liver. Started on IV lasix and albumin to aid diuresis. Diuresis with lasix has been unimpressive thus far, so we will add metolozone. He will likely need a few more days of treatments.  Assessment & Plan:   Principal Problem:   Decompensation of cirrhosis of liver (HCC) Active Problems:   Anemia, unspecified   Alcohol abuse   Alcoholic cirrhosis of liver with ascites (HCC)   DOE (dyspnea on exertion)   Peripheral edema   Anasarca   1. Decompensation of cirrhosis of liver. Patient continues to have evidence of massive volume overload. He is on IV lasix with unimpressive urine output. Albumin is low at 1.5. He has been started on albumin infusions to help with efficacy of lasix. Continue aldactone. Will restrict fluid intake and continue to monitor input and output. Continue low sodium diet. Continue IV lasix and add metolozone. 2. Alcoholic cirrhosis. Continue nadolol and xifaxan 3. Alcohol abuse. Has been alcohol free for 3 weeks. No signs of alcohol withdrawal at this time. Continue thiamine.  4. Anemia of chronic disease, possibly secondary to alcohol abuse. Hgb 6.6 today, though no evidence of gross bleeding at this time. Will transfuse 1 unit pRBC and recheck CBC in am.  5. GERD. Continue PPI 6. Morbid  obesity.   DVT prophylaxis: SCDs Code Status: Full Family Communication: discussed with patient and family present at bedside. Disposition Plan: Discharge once improved.   Consultants:   None  Procedures:  1 unit pRBC transfusion 8/12  ECHO Study Conclusions  - Left ventricle: The cavity size was normal. Wall thickness was   increased in a pattern of moderate LVH. Systolic function was   normal. The estimated ejection fraction was in the range of 60%   to 65%. Diastolic function is abnormal, indeterminate grade. Wall   motion was normal; there were no regional wall motion   abnormalities. - Aortic valve: Valve area (VTI): 3.9 cm^2. Valve area (Vmax): 3.74   cm^2. Valve area (Vmean): 4.11 cm^2. - Left atrium: The atrium was mildly dilated. - Technically adequate study.  Antimicrobials:  None   Subjective: Feels improved today. Still wheezing and coughing. Feels as though swelling has improved. He has been having watery bowel movements.  Objective: Vitals:   01/06/16 1949 01/06/16 2209 01/07/16 0122 01/07/16 0638  BP: 130/65 (!) 117/55 132/66 (!) 109/43  Pulse: 68 74 77 73  Resp: 16 20 20 20   Temp: 98.5 F (36.9 C) 98.3 F (36.8 C) 97.7 F (36.5 C) 97.6 F (36.4 C)  TempSrc: Oral Oral Oral Oral  SpO2: 100% 100% 100% 100%  Weight:      Height:        Intake/Output Summary (Last 24 hours) at 01/07/16 0654 Last data filed at 01/07/16 0300  Gross per 24 hour  Intake  1043 ml  Output             1200 ml  Net             -157 ml   Filed Weights   01/03/16 2044 01/04/16 0226 01/06/16 0500  Weight: (!) 161.9 kg (357 lb) (!) 175.6 kg (387 lb 3.2 oz) (!) 177.1 kg (390 lb 7 oz)   Examination:  General exam: Appears calm and comfortable  Respiratory system: Crackles at bases. Respiratory effort normal. Cardiovascular system: S1 & S2 heard, RRR. No JVD, murmurs, rubs, gallops or clicks. 2-3+ edema bilaterally. Gastrointestinal system: Abdomen is  nondistended, soft and nontender. No organomegaly or masses felt. Normal bowel sounds heard. Central nervous system: Alert and oriented. No focal neurological deficits. Extremities: Symmetric 5 x 5 power. Skin: No rashes, lesions or ulcers Psychiatry: Judgement and insight appear normal. Mood & affect appropriate.   Data Reviewed: I have personally reviewed following labs and imaging studies  CBC:  Recent Labs Lab 01/03/16 2200 01/04/16 0308  WBC 13.5* 13.8*  NEUTROABS 8.3*  --   HGB 7.3* 7.4*  HCT 23.3* 24.3*  MCV 98.3 98.4  PLT 103* 99991111*   Basic Metabolic Panel:  Recent Labs Lab 01/03/16 2200 01/04/16 0308 01/06/16 0638  NA 130* 130* 133*  K 5.0 4.4 3.8  CL 108 107 102  CO2 22 22 23   GLUCOSE 100* 101* 94  BUN 20 21* 25*  CREATININE 1.03 1.02 1.20  CALCIUM 7.6* 7.9* 8.1*  MG  --  1.3*  --    GFR: Estimated Creatinine Clearance: 134.9 mL/min (by C-G formula based on SCr of 1.2 mg/dL). Liver Function Tests:  Recent Labs Lab 01/03/16 2200  AST 72*  ALT 40  ALKPHOS 165*  BILITOT 3.2*  PROT 5.9*  ALBUMIN 1.5*   Coagulation Profile:  Recent Labs Lab 01/03/16 2200  INR 2.10   Urine analysis:    Component Value Date/Time   COLORURINE AMBER (A) 12/27/2015 2229   APPEARANCEUR CLEAR 12/27/2015 2229   LABSPEC 1.010 12/27/2015 2229   PHURINE 6.0 12/27/2015 2229   GLUCOSEU NEGATIVE 12/27/2015 2229   HGBUR LARGE (A) 12/27/2015 2229   BILIRUBINUR MODERATE (A) 12/27/2015 2229   KETONESUR NEGATIVE 12/27/2015 2229   PROTEINUR TRACE (A) 12/27/2015 2229   NITRITE NEGATIVE 12/27/2015 2229   LEUKOCYTESUR NEGATIVE 12/27/2015 2229   Scheduled Meds: . albumin human  25 g Intravenous 99991111  . folic acid  1 mg Oral Daily  . furosemide  60 mg Intravenous Q12H  . nadolol  40 mg Oral BID  . pantoprazole  40 mg Oral BID AC  . rifaximin  550 mg Oral BID  . sodium chloride flush  3 mL Intravenous Q12H  . spironolactone  100 mg Oral Daily  . thiamine  100 mg Oral Daily     Continuous Infusions:    LOS: 3 days    Time spent: 25 minutes    Kathie Dike, MD [facility] If 7PM-7AM, please contact night-coverage [email or web address] Password [email or web address] 01/07/2016, 6:54 AM   By signing my name below, I, Delene Ruffini, attest that this documentation has been prepared under the direction and in the presence of Kathie Dike, MD. Electronically Signed: Delene Ruffini 01/07/16 2:15pm  I, Dr. Kathie Dike, personally performed the services described in this documentaiton. All medical record entries made by the scribe were at my direction and in my presence. I have reviewed the chart and agree that the record reflects my personal performance and is  accurate and complete  Kathie Dike, MD, 01/07/2016 2:39 PM

## 2016-01-07 NOTE — Progress Notes (Signed)
CRITICAL VALUE ALERT       Late Entry   Critical value received:  HGB 6.6  Date of notification:  01/07/2016  Time of notification:    Critical value read back:yes   Nurse who received alert: Sharen Hones  MD notified (1st page):  Memon  Time of first page:  0840  MD notified (2nd page):  Time of second page:  Responding MD:  Roderic Palau  Time MD responded:  Orders placed

## 2016-01-07 NOTE — Progress Notes (Signed)
Received new order for PICC placement.  [facility] was notified.  Waiting on call return for estimated time of arrival for placement nurse.

## 2016-01-08 DIAGNOSIS — K7290 Hepatic failure, unspecified without coma: Secondary | ICD-10-CM

## 2016-01-08 DIAGNOSIS — D696 Thrombocytopenia, unspecified: Secondary | ICD-10-CM | POA: Diagnosis present

## 2016-01-08 DIAGNOSIS — F1010 Alcohol abuse, uncomplicated: Secondary | ICD-10-CM

## 2016-01-08 DIAGNOSIS — R601 Generalized edema: Secondary | ICD-10-CM

## 2016-01-08 DIAGNOSIS — D649 Anemia, unspecified: Secondary | ICD-10-CM

## 2016-01-08 LAB — BASIC METABOLIC PANEL
Anion gap: 5 (ref 5–15)
BUN: 33 mg/dL — ABNORMAL HIGH (ref 6–20)
CALCIUM: 7.4 mg/dL — AB (ref 8.9–10.3)
CHLORIDE: 101 mmol/L (ref 101–111)
CO2: 24 mmol/L (ref 22–32)
CREATININE: 1.41 mg/dL — AB (ref 0.61–1.24)
GFR, EST NON AFRICAN AMERICAN: 60 mL/min — AB (ref >60)
Glucose, Bld: 140 mg/dL — ABNORMAL HIGH (ref 65–99)
Potassium: 3.6 mmol/L (ref 3.5–5.1)
SODIUM: 130 mmol/L — AB (ref 135–145)

## 2016-01-08 LAB — PROTIME-INR
INR: 2.18
PROTHROMBIN TIME: 24.6 s — AB (ref 11.4–15.2)

## 2016-01-08 LAB — IRON AND TIBC
IRON: 18 ug/dL — AB (ref 45–182)
SATURATION RATIOS: 8 % — AB (ref 17.9–39.5)
TIBC: 220 ug/dL — AB (ref 250–450)
UIBC: 202 ug/dL

## 2016-01-08 LAB — FERRITIN: FERRITIN: 32 ng/mL (ref 24–336)

## 2016-01-08 LAB — SAVE SMEAR

## 2016-01-08 LAB — CBC
HCT: 20 % — ABNORMAL LOW (ref 39.0–52.0)
Hemoglobin: 6.2 g/dL — CL (ref 13.0–17.0)
MCH: 28.8 pg (ref 26.0–34.0)
MCHC: 31 g/dL (ref 30.0–36.0)
MCV: 93 fL (ref 78.0–100.0)
PLATELETS: 104 10*3/uL — AB (ref 150–400)
RBC: 2.15 MIL/uL — AB (ref 4.22–5.81)
RDW: 18.6 % — AB (ref 11.5–15.5)
WBC: 12.5 10*3/uL — AB (ref 4.0–10.5)

## 2016-01-08 LAB — HEPATIC FUNCTION PANEL
ALBUMIN: 2 g/dL — AB (ref 3.5–5.0)
ALT: 36 U/L (ref 17–63)
AST: 70 U/L — AB (ref 15–41)
Alkaline Phosphatase: 147 U/L — ABNORMAL HIGH (ref 38–126)
Bilirubin, Direct: 1.5 mg/dL — ABNORMAL HIGH (ref 0.1–0.5)
Indirect Bilirubin: 2 mg/dL — ABNORMAL HIGH (ref 0.3–0.9)
TOTAL PROTEIN: 6 g/dL — AB (ref 6.5–8.1)
Total Bilirubin: 3.5 mg/dL — ABNORMAL HIGH (ref 0.3–1.2)

## 2016-01-08 LAB — FOLATE: Folate: 12.3 ng/mL (ref >5.9)

## 2016-01-08 LAB — PREPARE RBC (CROSSMATCH)

## 2016-01-08 MED ORDER — ALBUMIN HUMAN 25 % IV SOLN
50.0000 g | Freq: Every day | INTRAVENOUS | Status: AC
  Administered 2016-01-08 – 2016-01-10 (×3): 50 g via INTRAVENOUS
  Filled 2016-01-08 (×3): qty 200

## 2016-01-08 MED ORDER — SODIUM CHLORIDE 0.9 % IV SOLN: Freq: Once | INTRAVENOUS | Status: DC

## 2016-01-08 NOTE — Progress Notes (Signed)
Notified Dr. Roderic Palau that patient continues to complain of pain even with new regimen.

## 2016-01-08 NOTE — Progress Notes (Signed)
PROGRESS NOTE    Antonio [name]  [id number] DOB: [birth year] DOA: 01/03/2016 PCP: Deloria Lair, MD  Outpatient Specialists: None  Brief Narrative: 34 yom with hx of alcoholic cirrhosis of liver, alcohol abuse, GERD, morbid obesity, and anemia of chronic disease, was recently discharged a few days ago for a GI bleed after receiving 2 units of PRBCs. He presents today with complaints of progressively worsened swelling in his legs and SOB. While in the ED, he was noted to have volume overload. Pt was readmitted for further management of his swelling and SOB. EKG showed sinus or ectopic atrial rhythm. ECHO showed EF of 60-65%. He was noted to have decompensation of cirrhosis of the liver. Started on IV lasix and albumin to aid diuresis. Diuresis with lasix has been unimpressive thus far, so we will add metolozone. He will likely need a few more days of treatments.  Assessment & Plan:   Principal Problem:   Decompensation of cirrhosis of liver (HCC) Active Problems:   Anemia, unspecified   Alcohol abuse   Alcoholic cirrhosis of liver with ascites (HCC)   DOE (dyspnea on exertion)   Peripheral edema   Anasarca   1. Decompensation of cirrhosis of liver. Patient continues to have evidence of massive volume overload.  Albumin is low at 1.5. He has been started on albumin infusions to help with efficacy of lasix. Continue aldactone. Will restrict fluid intake and continue to monitor input and output. Continue low sodium diet. Continue IV lasix and metolozone. 2. Alcoholic cirrhosis. Continue nadolol and xifaxan 3. Alcohol abuse. Has been alcohol free for 3 weeks. No signs of alcohol withdrawal at this time. Continue thiamine.  4. Anemia of chronic disease, possibly secondary to alcohol abuse. Hgb 6.2 today, though no evidence of gross bleeding at this time. Pt has been transfused with 1 unit of PRBC. Repeat cbc this morning shows hemoglobin is lower. Will transfuse another 2 unit prbc today. Will  consult GI to see if repeat endoscopic evaluation is needed. Check INR.  5. GERD. Continue PPI 6. Morbid obesity. 7. Thrombocytopenia. Likely related to cirrhosis   DVT prophylaxis: SCDs Code Status: Full Family Communication: discussed with family at the bedside Disposition Plan: Discharge once improved.   Consultants:   None  Procedures:  1 unit pRBC transfusion 8/12  Mid line placed 8/12  ECHO Study Conclusions  - Left ventricle: The cavity size was normal. Wall thickness was   increased in a pattern of moderate LVH. Systolic function was   normal. The estimated ejection fraction was in the range of 60%   to 65%. Diastolic function is abnormal, indeterminate grade. Wall   motion was normal; there were no regional wall motion   abnormalities. - Aortic valve: Valve area (VTI): 3.9 cm^2. Valve area (Vmax): 3.74   cm^2. Valve area (Vmean): 4.11 cm^2. - Left atrium: The atrium was mildly dilated. - Technically adequate study.  Antimicrobials:  None   Subjective: Has occasional spasms in hands. No abdominal pain.   Objective: Vitals:   01/07/16 2341 01/08/16 0000 01/08/16 0202 01/08/16 0556  BP: 123/63 (!) 109/51 119/65 (!) 129/59  Pulse: 78 76 74 73  Resp: 20 20 20 20   Temp: 98.6 F (37 C) 97.5 F (36.4 C) 98.1 F (36.7 C) 97.9 F (36.6 C)  TempSrc: Oral Oral Oral Oral  SpO2: 100% 100% 100% 100%  Weight:    (!) 181.8 kg (400 lb 14.4 oz)  Height:        Intake/Output Summary (Last  24 hours) at 01/08/16 0643 Last data filed at 01/08/16 0202  Gross per 24 hour  Intake           1144.5 ml  Output             1700 ml  Net           -555.5 ml   Filed Weights   01/04/16 0226 01/06/16 0500 01/08/16 0556  Weight: (!) 175.6 kg (387 lb 3.2 oz) (!) 177.1 kg (390 lb 7 oz) (!) 181.8 kg (400 lb 14.4 oz)   Examination:  General exam: Appears calm and comfortable  Respiratory system: Crackles at bases. Respiratory effort normal. Cardiovascular system: S1 & S2  heard, RRR. No JVD, murmurs, rubs, gallops or clicks. 3+ pedal edema with anasarca. Gastrointestinal system: Abdomen is nondistended, soft and nontender. No organomegaly or masses felt. Normal bowel sounds heard. Central nervous system: Alert and oriented. No focal neurological deficits. Extremities: Symmetric 5 x 5 power. Skin: No rashes, lesions or ulcers Psychiatry: Judgement and insight appear normal. Mood & affect appropriate.    Data Reviewed: I have personally reviewed following labs and imaging studies  CBC:  Recent Labs Lab 01/03/16 2200 01/04/16 0308 01/07/16 0643  WBC 13.5* 13.8* 14.7*  NEUTROABS 8.3*  --   --   HGB 7.3* 7.4* 6.6*  HCT 23.3* 24.3* 21.1*  MCV 98.3 98.4 95.0  PLT 103* 116* 0000000*   Basic Metabolic Panel:  Recent Labs Lab 01/03/16 2200 01/04/16 0308 01/06/16 0638 01/07/16 0643  NA 130* 130* 133* 131*  K 5.0 4.4 3.8 3.8  CL 108 107 102 103  CO2 22 22 23 25   GLUCOSE 100* 101* 94 111*  BUN 20 21* 25* 27*  CREATININE 1.03 1.02 1.20 1.26*  CALCIUM 7.6* 7.9* 8.1* 7.5*  MG  --  1.3*  --   --    GFR: Estimated Creatinine Clearance: 130.4 mL/min (by C-G formula based on SCr of 1.26 mg/dL). Liver Function Tests:  Recent Labs Lab 01/03/16 2200  AST 72*  ALT 40  ALKPHOS 165*  BILITOT 3.2*  PROT 5.9*  ALBUMIN 1.5*   Coagulation Profile:  Recent Labs Lab 01/03/16 2200  INR 2.10   Urine analysis:    Component Value Date/Time   COLORURINE AMBER (A) 12/27/2015 2229   APPEARANCEUR CLEAR 12/27/2015 2229   LABSPEC 1.010 12/27/2015 2229   PHURINE 6.0 12/27/2015 2229   GLUCOSEU NEGATIVE 12/27/2015 2229   HGBUR LARGE (A) 12/27/2015 2229   BILIRUBINUR MODERATE (A) 12/27/2015 2229   KETONESUR NEGATIVE 12/27/2015 2229   PROTEINUR TRACE (A) 12/27/2015 2229   NITRITE NEGATIVE 12/27/2015 2229   LEUKOCYTESUR NEGATIVE 12/27/2015 2229   Scheduled Meds: . sodium chloride   Intravenous Once  . albumin human  25 g Intravenous 99991111  . folic acid  1  mg Oral Daily  . furosemide  60 mg Intravenous Q12H  . metolazone  5 mg Oral Daily  . nadolol  40 mg Oral BID  . pantoprazole  40 mg Oral BID AC  . rifaximin  550 mg Oral BID  . sodium chloride flush  3 mL Intravenous Q12H  . spironolactone  100 mg Oral Daily  . thiamine  100 mg Oral Daily   Continuous Infusions:    LOS: 4 days   Time spent: 25 minutes  Kathie Dike, MD [facility]  If 7PM-7AM, please contact night-coverage [email or web address] Password [facility] 01/08/2016, 6:43 AM

## 2016-01-08 NOTE — Consult Note (Signed)
Referring Provider: No ref. provider found Primary Care Physician:  Deloria Lair, MD Primary Gastroenterologist:  Dr. Oneida Alar.  Reason for Consultation:    Anemia and anasarca in patient with known liver disease.  HPI:   Patient is 45 year old African-American male was history of alcoholic cirrhosis which was diagnosed in January 2016 when was admitted to [facility] for jaundice and new onset of ascites. He did undergo abdominal tap with removal of 2.1 L of fluid. Studies were negative for infection. He also had wire markers for hepatitis A and B and C and these were all negative. Ultrasound during that admission showed enlarged nodular liver as well as evidence of ascites splenomegaly and portal vein without thrombosis. Patient improved with therapy. He quit drinking alcohol until 6 months ago when he lost his job and started to drink liquor. He was admitted to this facility on 12/26/2015 and discharged on 12/31/2015 because of rectal bleeding and anemia. He underwent EGD which revealed grade 1-2 esophageal varices and mild portal gastropathy. He also had duodenal mucosal edema. His hemoglobin on admission was 5.8 and prior to discharge was 7.4 g. Patient was discharged on prednisone along with lactulose Xifaxan Nadolol and multivitamins.  Patient returned to emergency room on the evening of 01/03/2016 with shortness of breath. He was noted to have anasarca and had gained several pounds of weight. His hemoglobin was 6.3 g and dropped to 6.6 g yesterday. He received 1 unit of PRBCs and hemoglobin this morning was 6.2 g and he is to receive 2 units today. He is also receiving IV albumin and 3 diuretics. He weighed 400 pounds this morning. His weight was listed as 357 pounds on 01/03/2016. Patient states before he got sick he used to weigh around 225-250 pounds. He denies melena or rectal bleeding hematemesis or hematuria. He recalls that he passed only small amount of fresh blood prior to last admission.  He complains of exertional dyspnea on walking to the bathroom. He denies chest pain heartburn or dysphagia. He states he has very good appetite. He feels he is passing more urine today. He does not feel his abdomen size is increased significantly since he was discharged. He feels most of his fluid in his lower extremities as well as his flanks and some in upper extremities. Patient states he has not had any alcohol since 11/26/2015.  He is married and has 2 grownup sons. He does not smoke cigarettes. Alcohol history is as above. He states he is work for 25 years until he got laid off 6 months ago. 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 Prior to that he worked at a [employer]. He has 2 sisters and one brother in good health. Mother has COPD and is doing well. Father is in good health. One uncle died of alcoholic disease at age 69.   Past Medical History:  Diagnosis Date  . Alcohol abuse   . Alcoholic hepatitis   . Cirrhosis with alcoholism ([place])        Obesity. Diastolic dysfunction of indeterminant grade based on an call of 01/04/2016      Chronic anemia. Hemoglobin was low in January 2016 when he was at [facility].  Past Surgical History:  Procedure Laterality Date  . Colonoscopy     per patient around 2014 in [place]   . ESOPHAGOGASTRODUODENOSCOPY (EGD) WITH PROPOFOL [name] 12/28/2015   Procedure: ESOPHAGOGASTRODUODENOSCOPY (EGD) WITH PROPOFOL;  Surgeon: Danie Binder, MD;  Location: [facility];  Service: Endoscopy;  Laterality: [name];    Prior to Admission medications   Medication Sig Start Date End Date Taking? Authorizing Provider  Calcium Carb-Cholecalciferol (CALCIUM 600+D) 600-800 MG-UNIT TABS Take 1 tablet by mouth daily.   Yes Historical Provider, MD  folic acid (FOLVITE) 1 MG tablet Take 1 tablet (1 mg total) by mouth daily. 12/31/15  Yes Orvan Falconer, MD  lactulose (CHRONULAC) 10 GM/15ML solution Take 15 mLs (10 g total) by mouth 3 (three) times  daily as needed for mild constipation. 12/31/15  Yes Orvan Falconer, MD  nadolol (CORGARD) 40 MG tablet Take 1 tablet (40 mg total) by mouth 2 (two) times daily. 12/31/15  Yes Orvan Falconer, MD  pantoprazole (PROTONIX) 40 MG tablet Take 1 tablet (40 mg total) by mouth 2 (two) times daily before a meal. 12/31/15  Yes Orvan Falconer, MD  potassium chloride SA (K-DUR,KLOR-CON) 20 MEQ tablet Take 20 mEq by mouth 2 (two) times daily.   Yes Historical Provider, MD  prednisoLONE (ORAPRED) 15 MG/5ML solution Take 13.3 mLs (40 mg total) by mouth daily before breakfast. 12/31/15  Yes Orvan Falconer, MD  spironolactone (ALDACTONE) 100 MG tablet Take 100 mg by mouth daily.   Yes Historical Provider, MD  thiamine 100 MG tablet Take 1 tablet (100 mg total) by mouth daily. 12/31/15  Yes Orvan Falconer, MD  rifaximin (XIFAXAN) 550 MG TABS tablet Take 1 tablet (550 mg total) by mouth 2 (two) times daily. 12/31/15   Orvan Falconer, MD    Current Facility-Administered Medications  Medication Dose Route Frequency Provider Last Rate Last Dose  . 0.9 %  sodium chloride infusion  250 mL Intravenous PRN Phillips Grout, MD      . 0.9 %  sodium chloride infusion   Intravenous Once Kathie Dike, MD      . 0.9 %  sodium chloride infusion   Intravenous Once Kathie Dike, MD      . albumin human 25 % solution 25 g  25 g Intravenous Q6H Kathie Dike, MD   25 g at 01/08/16 0534  . folic acid (FOLVITE) tablet 1 mg  1 mg Oral Daily Phillips Grout, MD   1 mg at 01/08/16 0934  . furosemide (LASIX) injection 60 mg  60 mg Intravenous Q12H [name], MD   60 mg at 01/08/16 0533  . lactulose (CHRONULAC) 10 GM/15ML solution 10 g  10 g Oral TID PRN Phillips Grout, MD      . metolazone (ZAROXOLYN) tablet 5 mg  5 mg Oral Daily Kathie Dike, MD   5 mg at 01/08/16 0934  . nadolol (CORGARD) tablet 40 mg  40 mg Oral BID Phillips Grout, MD   40 mg at 01/08/16 0934  . pantoprazole (PROTONIX) EC tablet 40 mg  40 mg Oral BID AC Phillips Grout, MD   40 mg at 01/08/16 0934  .  rifaximin (XIFAXAN) tablet 550 mg  550 mg Oral BID [name], MD   550 mg at 01/08/16 0933  . sodium chloride flush (NS) 0.9 % injection 3 mL  3 mL Intravenous Q12H Phillips Grout, MD   3 mL at 01/07/16 2200  . sodium chloride flush (NS) 0.9 % injection 3 mL  3 mL Intravenous PRN Phillips Grout, MD      . spironolactone (ALDACTONE) tablet 100 mg  100 mg Oral Daily Phillips Grout, MD   100 mg at 01/08/16 0934  . thiamine (VITAMIN B-1) tablet 100 mg  100  mg Oral Daily Phillips Grout, MD   100 mg at 01/08/16 0934  . zolpidem (AMBIEN) tablet 5 mg  5 mg Oral QHS PRN Gardiner Barefoot, NP   5 mg at 01/04/16 2308    Allergies as of 01/03/2016  . (No Known Allergies)    Family History  Problem Relation Age of Onset  . Colon cancer Neg Hx   . Liver disease Neg Hx     Social History   Social History  . Marital status: Married    Spouse name: [name]  . Number of children: [name]  . Years of education: [name]   Occupational History  . Not on file.   Social History Main Topics  . Smoking status: Current Some Day Smoker    Packs/day: 0.25    Types: Cigarettes  . Smokeless tobacco: Never Used  . Alcohol use 3.6 oz/week    6 Cans of beer per week     Comment: drinks 4-5 beers a day and 1/2 pint of liquor daily   . Drug use: No  . Sexual activity: Yes   Other Topics Concern  . Not on file   Social History Narrative  . No narrative on file    Review of Systems: See HPI, otherwise normal ROS  Physical Exam: Temp:  [97.5 F (36.4 C)-98.6 F (37 C)] 97.9 F (36.6 C) (08/13 0653) Pulse Rate:  [71-81] 71 (08/13 0653) Resp:  [20] 20 (08/13 0653) BP: (109-129)/(51-65) 115/56 (08/13 0653) SpO2:  [100 %] 100 % (08/13 0653) Weight:  [400 lb 14.4 oz (181.8 kg)] 400 lb 14.4 oz (181.8 kg) (08/13 0556) Last BM Date: 01/06/16  Well-developed obese African-American male in NAD. Asterixis absent. Conjunctiva is pale. Sclerae mildly icteric. Oropharyngeal mucosa normal. Dentition in  satisfactory condition except few teeth are missing. No neck masses or thyromegaly noted. Bilateral gynecomastia without tenderness. Cardiac exam with regular rhythm normal S1 and S2. Grade 2/6 systolic ejection murmur best heard at left sternal border. Lungs are clear to auscultation. Abdomen is full. Multiple linear striae and edema noted to lower abdominal wall. He has small umbilicus hernia which is completely reducible. On palpation abdomen is soft and nontender without organomegaly or masses. Pitting edema noted to both flanks. He has 4+ pitting edema to both legs all the way up to groins. Mild edema also noted to upper extremities. He has PICC line with entry at right arm.  Intake/Output from previous day: 08/12 0701 - 08/13 0700 In: 1144.5 [P.O.:840; Blood:304.5] Out: 1700 [Urine:1700] Intake/Output this shift: No intake/output data recorded.  Lab Results:  Recent Labs  01/07/16 0643 01/08/16 0945  WBC 14.7* 12.5*  HGB 6.6* 6.2*  HCT 21.1* 20.0*  PLT 109* 104*   BMET  Recent Labs  01/06/16 0638 01/07/16 0643 01/08/16 0945  NA 133* 131* 130*  K 3.8 3.8 3.6  CL 102 103 101  CO2 23 25 24   GLUCOSE 94 111* 140*  BUN 25* 27* 33*  CREATININE 1.20 1.26* 1.41*  CALCIUM 8.1* 7.5* 7.4*   LFT  Recent Labs  01/08/16 0945  PROT 6.0*  ALBUMIN 2.0*  AST 70*  ALT 36  ALKPHOS 147*  BILITOT 3.5*  BILIDIR 1.5*  IBILI 2.0*     Assessment;  Patient is 45 year old African-American male with history of alcoholic liver disease who was recently hospitalized for hematochezia anemia and decompensated alcoholic liver disease. He now presents with massive fluid overload/anasarca and noted to be anemic. There is no evidence of overt  GI bleed. He certainly could have occult GI bleed but he may also have marrow failure and need to rule out hemolysis. He does have mild indirect bilirubinemia. Agree with plans for transfusion and will stool Hemoccults. This point I do not see  need or indication for endoscopic evaluation.  Massive fluid overload due to severe hypoalbuminemia. Clinically he has minimal ascites. His albumin has come up some with IV albumin. He seems to be responding to current diuretic therapy. Will have to watch her renal function and for electrolyte abnormalities. Agree with stopping prednisolone as it is making fluid overload worsened not necessarily helping his liver disease. Agree with limiting sodium to 2 g in his diet and also with fluid restriction.  Patient's chronic liver disease appears to be primarily due to alcoholic steatohepatitis but he could also have nonalcoholic steatohepatitis given obesity. Hopefully his condition can be stabilized and he should consider alcohol rehabilitation and subsequently referred for transplant evaluation. He would also need ultrasound with Doppler prior to discharge.     Recommendations;  Will check serum iron TIBC ferritin B12 and folate levels. We'll also ask the pathologist to review peripheral smear on blood from prior to transfusion. Will check serum haptoglobin and [place] antibody. Serum alpha-fetoprotein. Change albumin dose to 50 g IV daily for another 3 days. Continue current diuretic therapy which includes furosemide IV, oral metolazone and spironolactone. Will check urinary sodium. Upper abdominal ultrasound with Doppler studies prior to discharge.   LOS: 4 days   Najeeb Rehman  01/08/2016, 2:12 PM

## 2016-01-09 ENCOUNTER — Inpatient Hospital Stay (HOSPITAL_COMMUNITY): Payer: BLUE CROSS/BLUE SHIELD

## 2016-01-09 DIAGNOSIS — K7290 Hepatic failure, unspecified without coma: Principal | ICD-10-CM

## 2016-01-09 LAB — COMPREHENSIVE METABOLIC PANEL
ALK PHOS: 142 U/L — AB (ref 38–126)
ALT: 36 U/L (ref 17–63)
AST: 71 U/L — ABNORMAL HIGH (ref 15–41)
Albumin: 2.5 g/dL — ABNORMAL LOW (ref 3.5–5.0)
Anion gap: 5 (ref 5–15)
BUN: 38 mg/dL — ABNORMAL HIGH (ref 6–20)
CALCIUM: 7.8 mg/dL — AB (ref 8.9–10.3)
CO2: 24 mmol/L (ref 22–32)
CREATININE: 1.55 mg/dL — AB (ref 0.61–1.24)
Chloride: 101 mmol/L (ref 101–111)
GFR, EST NON AFRICAN AMERICAN: 53 mL/min — AB (ref >60)
Glucose, Bld: 92 mg/dL (ref 65–99)
Potassium: 3.6 mmol/L (ref 3.5–5.1)
SODIUM: 130 mmol/L — AB (ref 135–145)
Total Bilirubin: 4.5 mg/dL — ABNORMAL HIGH (ref 0.3–1.2)
Total Protein: 6.4 g/dL — ABNORMAL LOW (ref 6.5–8.1)

## 2016-01-09 LAB — TYPE AND SCREEN
ABO/RH(D): O NEG
ANTIBODY SCREEN: NEGATIVE
UNIT DIVISION: 0
UNIT DIVISION: 0
Unit division: 0

## 2016-01-09 LAB — PROTIME-INR
INR: 1.96
Prothrombin Time: 22.6 seconds — ABNORMAL HIGH (ref 11.4–15.2)

## 2016-01-09 LAB — CBC
HCT: 23.1 % — ABNORMAL LOW (ref 39.0–52.0)
Hemoglobin: 7.4 g/dL — ABNORMAL LOW (ref 13.0–17.0)
MCH: 29.1 pg (ref 26.0–34.0)
MCHC: 32 g/dL (ref 30.0–36.0)
MCV: 90.9 fL (ref 78.0–100.0)
PLATELETS: 104 10*3/uL — AB (ref 150–400)
RBC: 2.54 MIL/uL — AB (ref 4.22–5.81)
RDW: 18.3 % — AB (ref 11.5–15.5)
WBC: 13.1 10*3/uL — ABNORMAL HIGH (ref 4.0–10.5)

## 2016-01-09 LAB — LACTATE DEHYDROGENASE: LDH: 212 U/L — AB (ref 98–192)

## 2016-01-09 MED ORDER — LORAZEPAM 0.5 MG PO TABS
0.5000 mg | ORAL_TABLET | Freq: Once | ORAL | Status: AC
  Administered 2016-01-09: 0.5 mg via ORAL
  Filled 2016-01-09: qty 1

## 2016-01-09 MED ORDER — FUROSEMIDE 80 MG PO TABS
80.0000 mg | ORAL_TABLET | Freq: Once | ORAL | Status: AC
  Administered 2016-01-09: 80 mg via ORAL
  Filled 2016-01-09: qty 1

## 2016-01-09 MED ORDER — HYDROCODONE-ACETAMINOPHEN 5-325 MG PO TABS
1.0000 | ORAL_TABLET | ORAL | Status: DC | PRN
  Administered 2016-01-09: 1 via ORAL
  Administered 2016-01-10 (×2): 2 via ORAL
  Administered 2016-01-11 – 2016-01-12 (×3): 1 via ORAL
  Filled 2016-01-09 (×2): qty 2
  Filled 2016-01-09 (×5): qty 1

## 2016-01-09 MED ORDER — LORAZEPAM 2 MG/ML IJ SOLN: 0.5000 mg | Freq: Once | INTRAMUSCULAR | Status: DC

## 2016-01-09 NOTE — Progress Notes (Signed)
I was asked to evaluate Mr. Shertzer overnight for pain and swelling at the RUE just distal to his midline IV. The midline was placed on 8/12 and had been functioning appropriately with no apparent complication. Overnight, swelling and pain developed and rapidly progressed. The arm is being elevated currently. Suspect the midline has infiltrated; thrombosis also possible. Plan to pull the midline, continue elevation of extremity and prn analgesia, obtain vascular US to exclude UEDVT, and request PICC placement if pt has anticipated needs for continued IV access. This morning's Lasix dose was converted to oral.

## 2016-01-09 NOTE — Progress Notes (Signed)
Subjective: No abdominal pain, N/V. No confusion or mental status changes. Lost IV access overnight due to upper extremity swelling. Right upper extremity ultrasound ordered for today to exclude thrombosis. No IV access currently. No overt GI bleeding.   Objective: Vital signs in last 24 hours: Temp:  [97.5 F (36.4 C)-98.6 F (37 C)] 97.7 F (36.5 C) (08/14 0627) Pulse Rate:  [68-80] 74 (08/14 0627) Resp:  [18-20] 18 (08/14 0627) BP: (101-123)/(46-70) 121/70 (08/14 0627) SpO2:  [99 %-100 %] 100 % (08/14 0627) Last BM Date: 01/06/16 General:   Alert and oriented, pleasant Head:  Normocephalic and atraumatic. Eyes:  +scleral icterus  Mouth:  Without lesions, mucosa pink and moist.  Abdomen:  Obese, massive anasarca, multiple stretch marks to abdomen, umbilical hernia, unable to appreciate HSM due to large AP diameter Extremities:  3-4+ pitting lower extremity edema entire leg  Neurologic:  Alert and  oriented x4, negative asterixis  Psych:  Alert and cooperative. Normal mood and affect.  Intake/Output from previous day: 08/13 0701 - 08/14 0700 In: 1910.7 [P.O.:960; I.V.:114.7; Blood:636; IV Piggyback:200] Out: 3100 [Urine:3100] Intake/Output this shift: No intake/output data recorded.  Lab Results:  Recent Labs  01/07/16 0643 01/08/16 0945 01/09/16 0400  WBC 14.7* 12.5* 13.1*  HGB 6.6* 6.2* 7.4*  HCT 21.1* 20.0* 23.1*  PLT 109* 104* 104*   BMET  Recent Labs  01/07/16 0643 01/08/16 0945 01/09/16 0400  NA 131* 130* 130*  K 3.8 3.6 3.6  CL 103 101 101  CO2 25 24 24   GLUCOSE 111* 140* 92  BUN 27* 33* 38*  CREATININE 1.26* 1.41* 1.55*  CALCIUM 7.5* 7.4* 7.8*   LFT  Recent Labs  01/08/16 0945 01/09/16 0400  PROT 6.0* 6.4*  ALBUMIN 2.0* 2.5*  AST 70* 71*  ALT 36 36  ALKPHOS 147* 142*  BILITOT 3.5* 4.5*  BILIDIR 1.5*  --   IBILI 2.0*  --    PT/INR  Recent Labs  01/08/16 1455  LABPROT 24.6*  INR 2.18    Assessment: 45 year old male with  history of ETOH cirrhosis, ETOH hepatitis, decompensated liver disease, recently admitted 7/31-8/5 with likely lower GI bleed in the setting of coagulopathy, undergoing EGD during prior admission without stigmata of variceal bleed. Readmitted on 8/8  with anasarca, shortness of breath, worsening anemia. Clinically without signs of overt GI bleeding at this time. Prednisolone therapy for acute ETOH hepatitis from last admission has been discontinued, as he has failed to respond and fluid overload worsened in this setting.   Anemia: without overt GI bleeding. 3 units PRBCs received this admission with only 1 gram improvement. EGD completed recently with 4 columns of non-bleeding grade 1 and grade 2 varices, mild portal gastropathy, no stigmata of bleeding. In setting of coagulopathy, prior overt GI bleeding during recent hospitalization likely from lower GI tract and current persistent anemia from known coagulopathy, chronic disease, chronic blood loss. Iron low, ferritin low normal. Not a candidate for colonoscopy at this time. Further anemia work-up still pending to exclude other etiologies.   Cirrhosis: decompensated. INR and bilirubin trending up, AFP in process. Prednisolone discontinued. MELD 27 yesterday. Cr trending up. Will recheck INR today to recalculate. Poor prognosis overall.   As of note, has no IV access. Unable to receive albumin infusions due to lack of IV. [place] RUE ordered to rule out thrombosis. PICC line team has been requested for re-evaluation.   Plan: 1. Follow-up on pending labs 2. Will go ahead and order US abdomen  with duplex for tomorrow 3. Continue Albumin dosing as scheduled 4. Continue Xifaxan BID 5. Lactulose prn 6. Patient is negative 1.1 liters yesterday, will hold diuretic therapy today in setting of worsening Cr and anasarca likely not improving in setting of hypoalbuminemia. Will continue Albumin IV ONCE IV re-established along with Lasix only. Will discuss further  diuretic therapy with Dr. Laural Golden due to worsening renal function (Lasix, aldactone, metolazone).  7. Check INR now  Orvil Feil, ANP-BC [facility]      LOS: 5 days    01/09/2016, 8:32 AM

## 2016-01-09 NOTE — Progress Notes (Signed)
PROGRESS NOTE    Antonio [name]  [id number] DOB: 13-Jul-1970 DOA: 01/03/2016 PCP: Deloria Lair, MD  Outpatient Specialists: None  Brief Narrative: 13 yom with hx of alcoholic cirrhosis of liver, alcohol abuse, GERD, morbid obesity, and anemia of chronic disease, was recently discharged a few days ago for a GI bleed after receiving 2 units of PRBCs. He presents today with complaints of progressively worsened swelling in his legs and SOB. While in the ED, he was noted to have volume overload. Pt was readmitted for further management of his swelling and SOB. EKG showed sinus or ectopic atrial rhythm. ECHO showed EF of 60-65%. He was noted to have decompensation of cirrhosis of the liver. Started on IV lasix and albumin to aid diuresis. Diuresis with lasix has been unimpressive thus far, so we will add metolozone. He has been noted to be anemic, and has received 3 units pRBCs thus far.   Assessment & Plan:   Principal Problem:   Decompensation of cirrhosis of liver (HCC) Active Problems:   Anemia, unspecified   Alcohol abuse   Alcoholic cirrhosis of liver with ascites (HCC)   DOE (dyspnea on exertion)   Peripheral edema   Anasarca   Thrombocytopenia (HCC)   1. Decompensation of cirrhosis of liver. Patient continues to have evidence of massive volume overload.  Albumin is low at 2.0. He has been started on albumin infusions to help with efficacy of lasix. Continue aldactone. Will restrict fluid intake and continue to monitor input and output. Continue low sodium diet. Continue IV lasix and metolozone. Creatinine is trending up, but will likely need to tolerate a higher creatinine in order to achieve adequate diuresis 2. Alcoholic cirrhosis. Continue nadolol and xifaxan. Abd ultrasound planned for AM. 3. Alcohol abuse. Has been alcohol free for 3 weeks. No signs of alcohol withdrawal at this time. Continue thiamine.  4. Anemia. Anemia panel indicates iron deficiency. He was transfused 3 units   pRBC with improvement of hemoglobin by only 1 gram. No evidence of gross bleeding at this time. ?if there is a hemolytic process. Haptoglobin in process. LDH mildly elevated. will consult hematology  5. GERD. Continue PPI 6. Morbid obesity. 7. Thrombocytopenia. Likely related to cirrhosis 8. Possible DVT in RUE. Patient's Mid line infiltrated resulting in swelling. Venous dopplers could not rule out DVT. Regardless, with his significant anemia and underlying coagulopathy with INR of 2, he does seem to be a candidate for anticoagulation   DVT prophylaxis: SCDs Code Status: Full Family Communication: discussed with mother at the bedside Disposition Plan: Discharge once improved.   Consultants:   Gastroenterology  Procedures:  1 unit pRBC transfusion 8/12  2 units pRBC transfusion 8/13  Mid line placed 8/12  ECHO Study Conclusions  - Left ventricle: The cavity size was normal. Wall thickness was   increased in a pattern of moderate LVH. Systolic function was   normal. The estimated ejection fraction was in the range of 60%   to 65%. Diastolic function is abnormal, indeterminate grade. Wall   motion was normal; there were no regional wall motion   abnormalities. - Aortic valve: Valve area (VTI): 3.9 cm^2. Valve area (Vmax): 3.74   cm^2. Valve area (Vmean): 4.11 cm^2. - Left atrium: The atrium was mildly dilated. - Technically adequate study.  Antimicrobials:  None   Subjective: Feels breathing is a littler better today.   Objective: Vitals:   01/08/16 2003 01/08/16 2206 01/09/16 0428 01/09/16 0627  BP: 116/61 117/67 108/63 121/70  Pulse: 69  71 70 74  Resp: 18 18 18 18   Temp: 98 F (36.7 C) 97.6 F (36.4 C) 97.5 F (36.4 C) 97.7 F (36.5 C)  TempSrc: Oral Oral Oral Oral  SpO2:  100% 100% 100%  Weight:      Height:        Intake/Output Summary (Last 24 hours) at 01/09/16 0658 Last data filed at 01/09/16 0300  Gross per 24 hour  Intake          1910.67 ml    Output             3100 ml  Net         -1189.33 ml   Filed Weights   01/04/16 0226 01/06/16 0500 01/08/16 0556  Weight: (!) 175.6 kg (387 lb 3.2 oz) (!) 177.1 kg (390 lb 7 oz) (!) 181.8 kg (400 lb 14.4 oz)   Examination:  General exam: Appears calm and comfortable  Respiratory system: Clear to auscultation. Respiratory effort normal. Cardiovascular system: S1 & S2 heard, RRR. No JVD, murmurs, rubs, gallops or clicks. 3+ pedal edema with anasarca. Gastrointestinal system: Abdomen is distended, soft and nontender. No organomegaly or masses felt. Normal bowel sounds heard. Central nervous system: Alert and oriented. No focal neurological deficits. Extremities: Symmetric 5 x 5 power. Skin: No rashes, lesions or ulcers Psychiatry: Judgement and insight appear normal. Mood & affect appropriate.   Data Reviewed: I have personally reviewed following labs and imaging studies  CBC:  Recent Labs Lab 01/03/16 2200 01/04/16 0308 01/07/16 0643 01/08/16 0945 01/09/16 0400  WBC 13.5* 13.8* 14.7* 12.5* PENDING  NEUTROABS 8.3*  --   --   --   --   HGB 7.3* 7.4* 6.6* 6.2* 7.4*  HCT 23.3* 24.3* 21.1* 20.0* 23.1*  MCV 98.3 98.4 95.0 93.0 90.9  PLT 103* 116* 109* 104* PENDING   Basic Metabolic Panel:  Recent Labs Lab 01/04/16 0308 01/06/16 0638 01/07/16 0643 01/08/16 0945 01/09/16 0400  NA 130* 133* 131* 130* 130*  K 4.4 3.8 3.8 3.6 3.6  CL 107 102 103 101 101  CO2 22 23 25 24 24   GLUCOSE 101* 94 111* 140* 92  BUN 21* 25* 27* 33* 38*  CREATININE 1.02 1.20 1.26* 1.41* 1.55*  CALCIUM 7.9* 8.1* 7.5* 7.4* 7.8*  MG 1.3*  --   --   --   --    GFR: Estimated Creatinine Clearance: 106 mL/min (by C-G formula based on SCr of 1.55 mg/dL). Liver Function Tests:  Recent Labs Lab 01/03/16 2200 01/08/16 0945 01/09/16 0400  AST 72* 70* 71*  ALT 40 36 36  ALKPHOS 165* 147* 142*  BILITOT 3.2* 3.5* 4.5*  PROT 5.9* 6.0* 6.4*  ALBUMIN 1.5* 2.0* 2.5*   Coagulation Profile:  Recent  Labs Lab 01/03/16 2200 01/08/16 1455  INR 2.10 2.18   Urine analysis:    Component Value Date/Time   COLORURINE AMBER (A) 12/27/2015 2229   APPEARANCEUR CLEAR 12/27/2015 2229   LABSPEC 1.010 12/27/2015 2229   PHURINE 6.0 12/27/2015 2229   GLUCOSEU NEGATIVE 12/27/2015 2229   HGBUR LARGE (A) 12/27/2015 2229   BILIRUBINUR MODERATE (A) 12/27/2015 2229   KETONESUR NEGATIVE 12/27/2015 2229   PROTEINUR TRACE (A) 12/27/2015 2229   NITRITE NEGATIVE 12/27/2015 2229   LEUKOCYTESUR NEGATIVE 12/27/2015 2229   Scheduled Meds: . sodium chloride   Intravenous Once  . sodium chloride   Intravenous Once  . albumin human  50 g Intravenous Daily  . folic acid  1 mg Oral  Daily  . furosemide  60 mg Intravenous Q12H  . metolazone  5 mg Oral Daily  . nadolol  40 mg Oral BID  . pantoprazole  40 mg Oral BID AC  . rifaximin  550 mg Oral BID  . sodium chloride flush  3 mL Intravenous Q12H  . spironolactone  100 mg Oral Daily  . thiamine  100 mg Oral Daily   Continuous Infusions:    LOS: 5 days   Time spent: 25 minutes  Kathie Dike, MD [facility]  If 7PM-7AM, please contact night-coverage [email or web address] Password [facility] 01/09/2016, 6:58 AM

## 2016-01-09 NOTE — Progress Notes (Signed)
Midline removed by Supervisor Tim, RN, nurse at bedside. Catheter in tact, measuring 10 cm. Vaseline gauze and pressure to site. Gauze in tact with no visible blood. Patient responded well.

## 2016-01-09 NOTE — Progress Notes (Signed)
Patient c/o pain in swelling in left arm under Midline. Swelling and warmth has increased over the past 3-4 hours. Patient also complaining of some tingling in fingers.  Radial and brachial pulse palpated +3. Hospilist on call aware and up to see patient. Orders to pull midline, u/s of arm, and pain medication.

## 2016-01-10 ENCOUNTER — Inpatient Hospital Stay (HOSPITAL_COMMUNITY): Payer: BLUE CROSS/BLUE SHIELD

## 2016-01-10 DIAGNOSIS — N179 Acute kidney failure, unspecified: Secondary | ICD-10-CM | POA: Diagnosis not present

## 2016-01-10 DIAGNOSIS — K7682 Hepatic encephalopathy: Secondary | ICD-10-CM | POA: Diagnosis present

## 2016-01-10 DIAGNOSIS — K7290 Hepatic failure, unspecified without coma: Secondary | ICD-10-CM | POA: Diagnosis present

## 2016-01-10 DIAGNOSIS — R601 Generalized edema: Secondary | ICD-10-CM

## 2016-01-10 LAB — CBC
HEMATOCRIT: 22.7 % — AB (ref 39.0–52.0)
Hemoglobin: 7.3 g/dL — ABNORMAL LOW (ref 13.0–17.0)
MCH: 29.3 pg (ref 26.0–34.0)
MCHC: 32.2 g/dL (ref 30.0–36.0)
MCV: 91.2 fL (ref 78.0–100.0)
PLATELETS: 108 10*3/uL — AB (ref 150–400)
RBC: 2.49 MIL/uL — ABNORMAL LOW (ref 4.22–5.81)
RDW: 18.4 % — AB (ref 11.5–15.5)
WBC: 11.7 10*3/uL — AB (ref 4.0–10.5)

## 2016-01-10 LAB — BASIC METABOLIC PANEL
Anion gap: 6 (ref 5–15)
BUN: 42 mg/dL — AB (ref 6–20)
CO2: 26 mmol/L (ref 22–32)
Calcium: 8.5 mg/dL — ABNORMAL LOW (ref 8.9–10.3)
Chloride: 100 mmol/L — ABNORMAL LOW (ref 101–111)
Creatinine, Ser: 1.62 mg/dL — ABNORMAL HIGH (ref 0.61–1.24)
GFR calc Af Amer: 59 mL/min — ABNORMAL LOW (ref >60)
GFR, EST NON AFRICAN AMERICAN: 50 mL/min — AB (ref >60)
Glucose, Bld: 80 mg/dL (ref 65–99)
POTASSIUM: 4.2 mmol/L (ref 3.5–5.1)
SODIUM: 132 mmol/L — AB (ref 135–145)

## 2016-01-10 LAB — GLUCOSE, CAPILLARY: Glucose-Capillary: 96 mg/dL (ref 65–99)

## 2016-01-10 LAB — AMMONIA: AMMONIA: 140 umol/L — AB (ref 9–35)

## 2016-01-10 LAB — DIRECT ANTIGLOBULIN TEST (NOT AT ARMC)
DAT, COMPLEMENT: NEGATIVE
DAT, IGG: NEGATIVE

## 2016-01-10 LAB — RETICULOCYTES
RBC.: 2.5 MIL/uL — ABNORMAL LOW (ref 4.22–5.81)
Retic Count, Absolute: 97.5 10*3/uL (ref 19.0–186.0)
Retic Ct Pct: 3.9 % — ABNORMAL HIGH (ref 0.4–3.1)

## 2016-01-10 LAB — HAPTOGLOBIN: Haptoglobin: 15 mg/dL — ABNORMAL LOW (ref 34–200)

## 2016-01-10 LAB — AFP TUMOR MARKER: AFP TUMOR MARKER: 4.3 ng/mL (ref 0.0–8.3)

## 2016-01-10 MED ORDER — SODIUM CHLORIDE 0.9 % IV SOLN
510.0000 mg | Freq: Once | INTRAVENOUS | Status: DC
  Filled 2016-01-10: qty 17

## 2016-01-10 MED ORDER — FUROSEMIDE 10 MG/ML IJ SOLN
100.0000 mg | Freq: Two times a day (BID) | INTRAVENOUS | Status: DC
  Administered 2016-01-10 – 2016-01-12 (×4): 100 mg via INTRAVENOUS
  Filled 2016-01-10 (×6): qty 10

## 2016-01-10 MED ORDER — ALBUMIN HUMAN 25 % IV SOLN
50.0000 g | Freq: Every day | INTRAVENOUS | Status: AC
  Administered 2016-01-11 – 2016-01-12 (×2): 50 g via INTRAVENOUS
  Filled 2016-01-10 (×2): qty 200

## 2016-01-10 MED ORDER — SODIUM CHLORIDE 0.9 % IV SOLN
510.0000 mg | Freq: Once | INTRAVENOUS | Status: AC
  Administered 2016-01-10: 510 mg via INTRAVENOUS
  Filled 2016-01-10: qty 17

## 2016-01-10 MED ORDER — FUROSEMIDE 10 MG/ML IJ SOLN
40.0000 mg | Freq: Once | INTRAMUSCULAR | Status: AC
Start: 2016-01-10 — End: 2016-01-10
  Administered 2016-01-10: 40 mg via INTRAVENOUS
  Filled 2016-01-10: qty 4

## 2016-01-10 MED ORDER — ALBUMIN HUMAN 25 % IV SOLN: 25.0000 g | Freq: Every day | INTRAVENOUS | Status: DC

## 2016-01-10 MED ORDER — LACTULOSE 10 GM/15ML PO SOLN
20.0000 g | Freq: Three times a day (TID) | ORAL | Status: DC
  Administered 2016-01-10 – 2016-01-17 (×20): 20 g via ORAL
  Filled 2016-01-10 (×21): qty 30

## 2016-01-10 NOTE — Progress Notes (Signed)
Subjective: Drowsy this morning but easily awakens. No abdominal pain, N/V. No overt GI bleeding. Feels like swelling is improving.   Objective: Vital signs in last 24 hours: Temp:  [97.6 F (36.4 C)-97.7 F (36.5 C)] 97.6 F (36.4 C) (08/15 0609) Pulse Rate:  [68-73] 68 (08/15 0609) Resp:  [15-18] 15 (08/15 0609) BP: (102-123)/(47-65) 123/47 (08/15 0609) SpO2:  [99 %-100 %] 99 % (08/15 0609) Weight:  [396 lb 9.6 oz (179.9 kg)] 396 lb 9.6 oz (179.9 kg) (08/15 0609) Last BM Date: 01/06/16 General:   Resting with eyes closed, easily arousable.  Head:  Normocephalic and atraumatic. Eyes:  Mild scleral icterus  Abdomen:  Bowel sounds present, obese, less distended, umbilical hernia present, multiple  striae  Extremities:  Improved pitting edema, 3+ Neurologic:  Drowsy but easily awakens, negative asterixis, oriented X 4    Intake/Output from previous day: 08/14 0701 - 08/15 0700 In: 720 [P.O.:720] Out: -  Intake/Output this shift: No intake/output data recorded.  Lab Results:  Recent Labs  01/08/16 0945 01/09/16 0400 01/10/16 0629  WBC 12.5* 13.1* 11.7*  HGB 6.2* 7.4* 7.3*  HCT 20.0* 23.1* 22.7*  PLT 104* 104* 108*   BMET  Recent Labs  01/08/16 0945 01/09/16 0400 01/10/16 0629  NA 130* 130* 132*  K 3.6 3.6 4.2  CL 101 101 100*  CO2 24 24 26   GLUCOSE 140* 92 80  BUN 33* 38* 42*  CREATININE 1.41* 1.55* 1.62*  CALCIUM 7.4* 7.8* 8.5*   LFT  Recent Labs  01/08/16 0945 01/09/16 0400  PROT 6.0* 6.4*  ALBUMIN 2.0* 2.5*  AST 70* 71*  ALT 36 36  ALKPHOS 147* 142*  BILITOT 3.5* 4.5*  BILIDIR 1.5*  --   IBILI 2.0*  --    PT/INR  Recent Labs  01/08/16 1455 01/09/16 1519  LABPROT 24.6* 22.6*  INR 2.18 1.96     Studies/Results: US Venous Img Upper Uni Right  Result Date: 01/09/2016 CLINICAL DATA:  Right upper extremity edema. EXAM: RIGHT UPPER EXTREMITY VENOUS DOPPLER ULTRASOUND TECHNIQUE: Gray-scale sonography with graded compression, as  well as color Doppler and duplex ultrasound were performed to evaluate the upper extremity deep venous system from the level of the subclavian vein and including the jugular, axillary, basilic, radial, ulnar and upper cephalic vein. Spectral Doppler was utilized to evaluate flow at rest and with distal augmentation maneuvers. COMPARISON:  None. FINDINGS: Contralateral Subclavian Vein: Respiratory phasicity is normal and symmetric with the symptomatic side. No evidence of thrombus. Normal compressibility. Internal Jugular Vein: No evidence of thrombus. Normal compressibility, respiratory phasicity and response to augmentation. Subclavian Vein: No evidence of thrombus. Normal compressibility, respiratory phasicity and response to augmentation. Axillary Vein: No evidence of thrombus. Normal compressibility, respiratory phasicity and response to augmentation. Cephalic Vein: No evidence of thrombus. Normal compressibility, respiratory phasicity and response to augmentation. Basilic Vein: No evidence of thrombus. Normal compressibility, respiratory phasicity and response to augmentation. Brachial Veins: No evidence of thrombus. Normal compressibility, respiratory phasicity and response to augmentation. Radial Veins: No evidence of thrombus. Normal compressibility, respiratory phasicity and response to augmentation. Ulnar Veins: No evidence of thrombus. Normal compressibility, respiratory phasicity and response to augmentation. Venous Reflux:  None visualized. Other Findings: Nonocclusive thrombus is identified in a segment of a vein adjacent to the axillary artery and vein. This either represents a duplicated axillary segment or potentially a large basilic vein extending up into the axilla. This is not the normal position of the cephalic vein. IMPRESSION: Nonocclusive  thrombus identified in a segment of vein adjacent to the axillary artery and vein. This either represents DVT in a duplicated axillary vein or potentially  superficial thrombophlebitis of an extended basilic vein. Electronically Signed   By: Aletta Edouard M.D.   On: 01/09/2016 12:03   Dg Chest Port 1 View  Result Date: 01/09/2016 CLINICAL DATA:  Central catheter placement EXAM: PORTABLE CHEST 1 VIEW COMPARISON:  January 03, 2016 FINDINGS: Central catheter tip is in the superior vena cava. Pneumothorax. A small portion of the lateral left base is not visualized. The visualized lungs show no edema or consolidation. Heart is mildly enlarged with pulmonary vascularity within normal limits. No adenopathy. No bone lesions. IMPRESSION: Visualized lung regions are clear. No pneumothorax. Stable cardiomegaly. Central catheter tip in superior vena cava. Electronically Signed   By: Lowella Grip III M.D.   On: 01/09/2016 21:38    Assessment: 45 year old [name] with history of ETOH cirrhosis, ETOH hepatitis, decompensated liver disease, recently admitted 7/31-8/5 with likely lower GI bleed in the setting of coagulopathy, undergoing EGD during prior admission without stigmata of variceal bleed. Readmitted on 8/8  with anasarca, shortness of breath, worsening anemia. Clinically without signs of overt GI bleeding at this time. Prednisolone therapy for acute ETOH hepatitis from last admission has been discontinued, as he has failed to respond and fluid overload worsened in this setting.   Anemia: without overt GI bleeding. 3 units PRBCs received this admission with only 1 gram improvement. . In setting of coagulopathy, prior overt GI bleeding during recent hospitalization likely from lower GI tract and current persistent anemia from known coagulopathy, chronic disease, chronic blood loss. Iron low, ferritin low normal. Not a candidate for colonoscopy at this time.   Cirrhosis: decompensated. AFP normal. No evidence of portal vein thrombosis, US abdomen still pending.  Prednisolone discontinued. MELD NA 28 as of 8/14.  Cr trending up.Will consult Nephrology. Today  appears drowsy but easily arousable. Will check ammonia. No asterixis on exam. Poor prognosis overall.      Plan: 1.Nephrology consult due to worsening Creatinine 2. Dose of Lasix today to follow Albumin IV dose: await further recommendations for diuretic therapy per Nephrology 3. Check ammonia 4. INR in am 5. Xifaxan BID, may need lactulose dosing titrated instead of prn 6. Await final ultrasound report   Orvil Feil, ANP-BC [facility]    LOS: 6 days    01/10/2016, 8:24 AM

## 2016-01-10 NOTE — Consult Note (Signed)
[facility] Consultation Oncology  Name: Antonio [name]      MRN: [id number]    Location: R518/A416-60  Date: 01/10/2016 Time:6:15 PM   REFERRING PHYSICIAN:  Kathie Dike, MD ([facility])  REASON FOR CONSULT:  Anemia   DIAGNOSIS:  Normocytic, normochromic anemia requiring PRBC transfusions in the setting of EtOH cirrhosis, esophageal varices,and severe liver disease.   HISTORY OF PRESENT ILLNESS:  Antonio [name] is a 45 yo man with a past medical history significant for EtOH abuse, decompensated cirrhosis of liver, anasarca, and thrombocytopenia who presented to the ED on 01/03/2016 with complaint of SOB.  Chart is reviewed.  Patient was admitted from 7/31- 12/31/2015 with UGI.  He underwent EGD by Dr. Oneida Alar on 12/28/2015 demonstrating non-bleeding grade I and II esophageal varices, mild portal hypertensive gastropathy.  From a GI perspective, he has been evaluated at [facility] and deemed not a transplant candidate due to his continued EtOH abuse.    I personally reviewed and went over laboratory results with the patient.  The results are noted within this dictation. At time of ED presentation, patient's HGB was 6.6.  LDH is minimally elevated, haptoglobin is mildly low.  Indirect bilirubin is elevated. INR in 1.96.  I personally reviewed and went over radiographic studies with the patient.  The results are noted within this dictation.    Review of Systems  Constitutional: Negative.  Negative for chills, fever and weight loss.  HENT: Negative.  Negative for nosebleeds.   Eyes: Negative.   Respiratory: Negative.  Negative for hemoptysis.   Cardiovascular: Positive for leg swelling (B/L).  Gastrointestinal: Negative.  Negative for blood in stool and melena.  Genitourinary:       Dark/tea-colored urine  Musculoskeletal: Negative.   Skin: Negative.        Oozing blood from venipunture sites  Neurological: Negative.   Endo/Heme/Allergies: Negative.   Psychiatric/Behavioral: Negative.        PAST MEDICAL HISTORY:   Past Medical History:  Diagnosis Date  . Alcohol abuse   . Alcoholic hepatitis   . Cirrhosis with alcoholism ([place])     ALLERGIES: No Known Allergies    MEDICATIONS: I have reviewed the patient's current medications.     PAST SURGICAL HISTORY Past Surgical History:  Procedure Laterality Date  . Colonoscopy     per patient around 2014 in [place]   . ESOPHAGOGASTRODUODENOSCOPY (EGD) WITH PROPOFOL [name] 12/28/2015   Procedure: ESOPHAGOGASTRODUODENOSCOPY (EGD) WITH PROPOFOL;  Surgeon: Danie Binder, MD;  Location: [facility];  Service: Endoscopy;  Laterality: [name];    FAMILY HISTORY: Family History  Problem Relation Age of Onset  . Colon cancer Neg Hx   . Liver disease Neg Hx     SOCIAL HISTORY:  reports that he has been smoking Cigarettes.  He has been smoking about 0.25 packs per day. He has never used smokeless tobacco. He reports that he drinks about 3.6 oz of alcohol per week . He reports that he does not use drugs.  PERFORMANCE STATUS: The patient's performance status is 1 - Symptomatic but completely ambulatory  PHYSICAL EXAM: Most Recent Vital Signs: Blood pressure (!) 120/52, pulse 71, temperature 98.1 F (36.7 C), temperature source Oral, resp. rate 20, height '6\' 2"'$  (1.88 m), weight (!) 396 lb 9.6 oz (179.9 kg), SpO2 100 %. General appearance: alert, cooperative, appears stated age, no distress, morbidly obese and brother and mother at the bedside Head: Normocephalic, without obvious abnormality, atraumatic Eyes: negative findings: lids and lashes normal, conjunctivae  and sclerae normal and corneas clear Lungs: clear to auscultation bilaterally Heart: regular rate and rhythm Abdomen: normal findings: no organomegaly, soft, non-tender and spleen non-palpable Extremities: edema B/L LE edema with SCDs in place Skin: oozing blood from venipuncture sites Neurologic: Grossly normal  LABORATORY DATA:  Results for orders placed or performed  during the hospital encounter of 01/03/16 (from the past 48 hour(s))  Comprehensive metabolic panel     Status: Abnormal   Collection Time: 01/09/16  4:00 AM  Result Value Ref Range   Sodium 130 (L) 135 - 145 mmol/L   Potassium 3.6 3.5 - 5.1 mmol/L   Chloride 101 101 - 111 mmol/L   CO2 24 22 - 32 mmol/L   Glucose, Bld 92 65 - 99 mg/dL   BUN 38 (H) 6 - 20 mg/dL   Creatinine, Ser 1.55 (H) 0.61 - 1.24 mg/dL   Calcium 7.8 (L) 8.9 - 10.3 mg/dL   Total Protein 6.4 (L) 6.5 - 8.1 g/dL   Albumin 2.5 (L) 3.5 - 5.0 g/dL   AST 71 (H) 15 - 41 U/L   ALT 36 17 - 63 U/L   Alkaline Phosphatase 142 (H) 38 - 126 U/L   Total Bilirubin 4.5 (H) 0.3 - 1.2 mg/dL   GFR calc non Af Amer 53 (L) >60 mL/min   GFR calc Af Amer >60 >60 mL/min    Comment: (NOTE) The eGFR has been calculated using the CKD EPI equation. This calculation has not been validated in all clinical situations. eGFR's persistently <60 mL/min signify possible Chronic Kidney Disease.    Anion gap 5 5 - 15  CBC     Status: Abnormal   Collection Time: 01/09/16  4:00 AM  Result Value Ref Range   WBC 13.1 (H) 4.0 - 10.5 K/uL    Comment: WHITE COUNT CONFIRMED ON SMEAR   RBC 2.54 (L) 4.22 - 5.81 MIL/uL   Hemoglobin 7.4 (L) 13.0 - 17.0 g/dL   HCT 23.1 (L) 39.0 - 52.0 %   MCV 90.9 78.0 - 100.0 fL   MCH 29.1 26.0 - 34.0 pg   MCHC 32.0 30.0 - 36.0 g/dL   RDW 18.3 (H) 11.5 - 15.5 %   Platelets 104 (L) 150 - 400 K/uL    Comment: PLATELETS APPEAR DECREASED  AFP tumor marker     Status: None   Collection Time: 01/09/16  4:00 AM  Result Value Ref Range   AFP-Tumor Marker 4.3 0.0 - 8.3 ng/mL    Comment: (NOTE) Roche ECLIA methodology Performed At: [facility], [place] [id number] Lindon Romp MD JO:[id number]   Lactate dehydrogenase     Status: Abnormal   Collection Time: 01/09/16  4:00 AM  Result Value Ref Range   LDH 212 (H) 98 - 192 U/L  Protime-INR     Status: Abnormal   Collection Time: 01/09/16   3:19 PM  Result Value Ref Range   Prothrombin Time 22.6 (H) 11.4 - 15.2 seconds   INR 4.70   Basic metabolic panel     Status: Abnormal   Collection Time: 01/10/16  6:29 AM  Result Value Ref Range   Sodium 132 (L) 135 - 145 mmol/L   Potassium 4.2 3.5 - 5.1 mmol/L   Chloride 100 (L) 101 - 111 mmol/L   CO2 26 22 - 32 mmol/L   Glucose, Bld 80 65 - 99 mg/dL   BUN 42 (H) 6 - 20 mg/dL   Creatinine, Ser 1.62 (H) 0.61 -  1.24 mg/dL   Calcium 8.5 (L) 8.9 - 10.3 mg/dL   GFR calc non Af Amer 50 (L) >60 mL/min   GFR calc Af Amer 59 (L) >60 mL/min    Comment: (NOTE) The eGFR has been calculated using the CKD EPI equation. This calculation has not been validated in all clinical situations. eGFR's persistently <60 mL/min signify possible Chronic Kidney Disease.    Anion gap 6 5 - 15  CBC     Status: Abnormal   Collection Time: 01/10/16  6:29 AM  Result Value Ref Range   WBC 11.7 (H) 4.0 - 10.5 K/uL   RBC 2.49 (L) 4.22 - 5.81 MIL/uL   Hemoglobin 7.3 (L) 13.0 - 17.0 g/dL   HCT 22.7 (L) 39.0 - 52.0 %   MCV 91.2 78.0 - 100.0 fL   MCH 29.3 26.0 - 34.0 pg   MCHC 32.2 30.0 - 36.0 g/dL   RDW 18.4 (H) 11.5 - 15.5 %   Platelets 108 (L) 150 - 400 K/uL    Comment: SPECIMEN CHECKED FOR CLOTS CONSISTENT WITH PREVIOUS RESULT   Ammonia     Status: Abnormal   Collection Time: 01/10/16 11:43 AM  Result Value Ref Range   Ammonia 140 (H) 9 - 35 umol/L  Reticulocytes     Status: Abnormal   Collection Time: 01/10/16  6:00 PM  Result Value Ref Range   Retic Ct Pct 3.9 (H) 0.4 - 3.1 %   RBC. 2.50 (L) 4.22 - 5.81 MIL/uL   Retic Count, Manual 97.5 19.0 - 186.0 K/uL      RADIOGRAPHY: US Venous Img Upper Uni Right  Result Date: 01/09/2016 CLINICAL DATA:  Right upper extremity edema. EXAM: RIGHT UPPER EXTREMITY VENOUS DOPPLER ULTRASOUND TECHNIQUE: Gray-scale sonography with graded compression, as well as color Doppler and duplex ultrasound were performed to evaluate the upper extremity deep venous system  from the level of the subclavian vein and including the jugular, axillary, basilic, radial, ulnar and upper cephalic vein. Spectral Doppler was utilized to evaluate flow at rest and with distal augmentation maneuvers. COMPARISON:  None. FINDINGS: Contralateral Subclavian Vein: Respiratory phasicity is normal and symmetric with the symptomatic side. No evidence of thrombus. Normal compressibility. Internal Jugular Vein: No evidence of thrombus. Normal compressibility, respiratory phasicity and response to augmentation. Subclavian Vein: No evidence of thrombus. Normal compressibility, respiratory phasicity and response to augmentation. Axillary Vein: No evidence of thrombus. Normal compressibility, respiratory phasicity and response to augmentation. Cephalic Vein: No evidence of thrombus. Normal compressibility, respiratory phasicity and response to augmentation. Basilic Vein: No evidence of thrombus. Normal compressibility, respiratory phasicity and response to augmentation. Brachial Veins: No evidence of thrombus. Normal compressibility, respiratory phasicity and response to augmentation. Radial Veins: No evidence of thrombus. Normal compressibility, respiratory phasicity and response to augmentation. Ulnar Veins: No evidence of thrombus. Normal compressibility, respiratory phasicity and response to augmentation. Venous Reflux:  None visualized. Other Findings: Nonocclusive thrombus is identified in a segment of a vein adjacent to the axillary artery and vein. This either represents a duplicated axillary segment or potentially a large basilic vein extending up into the axilla. This is not the normal position of the cephalic vein. IMPRESSION: Nonocclusive thrombus identified in a segment of vein adjacent to the axillary artery and vein. This either represents DVT in a duplicated axillary vein or potentially superficial thrombophlebitis of an extended basilic vein. Electronically Signed   By: Aletta Edouard M.D.    On: 01/09/2016 12:03   [place] Art/ven Flow Abd Pelv Doppler  Limited  Result Date: 01/10/2016 CLINICAL DATA:  Cirrhosis with questionable portal vein thrombosis. EXAM: US ABDOMEN LIMITED - RIGHT UPPER QUADRANT COMPARISON:  None. FINDINGS: Exam somewhat difficult due to patient body habitus. Gallbladder: Mild cholelithiasis with a 1.1 cm stone over the region of the gallbladder neck. Borderline wall thickening measuring 3.4 mm. No pericholecystic fluid. Negative sonographic Murphy sign. Common bile duct: Diameter: 3.0 mm. Liver: Somewhat heterogeneous echotexture with nodular contour compatible with known cirrhosis. No focal mass. Portal vein is patent with normal hepatopetal flow and normal Doppler waveform and velocity of 27 cm/sec. IMPRESSION: Evidence of known cirrhosis. Patent portal vein with normal hepatopetal flow and normal velocity of 27 cm/sec. Mild cholelithiasis with 1.1 cm stone over the region of the gallbladder neck. No additional sonographic evidence to suggest cholecystitis. Electronically Signed   By: Marin Olp M.D.   On: 01/10/2016 08:48   Dg Chest Port 1 View  Result Date: 01/09/2016 CLINICAL DATA:  Central catheter placement EXAM: PORTABLE CHEST 1 VIEW COMPARISON:  January 03, 2016 FINDINGS: Central catheter tip is in the superior vena cava. Pneumothorax. A small portion of the lateral left base is not visualized. The visualized lungs show no edema or consolidation. Heart is mildly enlarged with pulmonary vascularity within normal limits. No adenopathy. No bone lesions. IMPRESSION: Visualized lung regions are clear. No pneumothorax. Stable cardiomegaly. Central catheter tip in superior vena cava. Electronically Signed   By: Lowella Grip III M.D.   On: 01/09/2016 21:38   US Abdomen Limited Ruq  Result Date: 01/10/2016 CLINICAL DATA:  Cirrhosis with questionable portal vein thrombosis. EXAM: US ABDOMEN LIMITED - RIGHT UPPER QUADRANT COMPARISON:  None. FINDINGS: Exam somewhat  difficult due to patient body habitus. Gallbladder: Mild cholelithiasis with a 1.1 cm stone over the region of the gallbladder neck. Borderline wall thickening measuring 3.4 mm. No pericholecystic fluid. Negative sonographic Murphy sign. Common bile duct: Diameter: 3.0 mm. Liver: Somewhat heterogeneous echotexture with nodular contour compatible with known cirrhosis. No focal mass. Portal vein is patent with normal hepatopetal flow and normal Doppler waveform and velocity of 27 cm/sec. IMPRESSION: Evidence of known cirrhosis. Patent portal vein with normal hepatopetal flow and normal velocity of 27 cm/sec. Mild cholelithiasis with 1.1 cm stone over the region of the gallbladder neck. No additional sonographic evidence to suggest cholecystitis. Electronically Signed   By: Marin Olp M.D.   On: 01/10/2016 08:48       PATHOLOGY:  [name]  ASSESSMENT/PLAN:  Normocytic, normochromic anemia requiring PRBC transfusions in the setting of EtOH cirrhosis, esophageal varices,and severe liver disease. Anemia work-up thus far is reviewed.  Ferritin is low normal with low serum iron and saturation ratio.  TIBC is also low.  Iron deficit calculation is completed and therefore, order is placed for Feraheme 510 mg.  Of note, if imaging of liver is needed, Feraheme will interfere with accurate readings. 7 units of PRBCs since 12/27/2015 is noted.   Element of hemolysis is considered given minimally elevated LDH, mildly low haptoglobin, elevated indirect bilirubin.  Patient admits to dark urine: "tea colored".  DAT, G6PD, Retic count, and pathologist smear review is ordered (prior to PRBC transfusion).  ?Nonocclusive thrombus in vein adjacent to axillary vein/artery?  Would not anticoagulate at this time given liver disease, INR in therapeutic range, and difficulty with stopping bleeding.  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  He reports that he favored whiskey as his drink of choice.  He quite about 4-6 weeks ago.   According to  [facility], he is not a candidate for liver transplant.  During discussion, it is clear that the patient, mother, and brother are realistic but may benefit from additional support regarding the patient's poor prognosis.  It may be reasonable to consult palliative medicine to help assist patient and family make decisions regarding goals of care and end of life issues.  All questions were answered. The patient knows to call the clinic with any problems, questions or concerns. We can certainly see the patient much sooner if necessary.  The patient and plan discussed with Dr. Oliva Bustard and he is in agreement with the aforementioned. Attending's note Has been reviewed all lab records have been reviewed. Multiple coagulation factors might be involved because of patient's history of liver disease.  INR is high.  Patient continues to bleed from all venipuncture sites. 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  Proceed with vitamin K.  Patient may need further workup at [facility] for coagulation factor deficiencies. Underlying problem of liver disease baby life limiting   KEFALAS,THOMAS  01/10/2016 6:15 PM

## 2016-01-10 NOTE — Consult Note (Signed)
Reason for Consult: Acute kidney injury and anasarca Referring Physician: Dr. Algis Downs [name] is an 45 y.o. male.  HPI: He is a patient who has history of alcoholic liver cirrhosis, morbid obesity, anemia presently came with complaints of difficulty breathing, increase in leg swelling. Patient was found to have significant anasarca. He was started on diuretics with some improvement. Presently however his BUN and creatinine seems to be increasing hence consult called. Patient denies any previous history of renal failure or kidney stone. However he has been admitted to the hospital because of CHF about 3 times. Patient states that his normal weight is about 225-230. Presently patient is about 396. Since he has gained significant weight. Patient claims he is feeling better. He doesn't have any nausea or vomiting.I have reviewed the patient's current medications.  Past Medical History:  Diagnosis Date  . Alcohol abuse   . Alcoholic hepatitis   . Cirrhosis with alcoholism [facility])     Past Surgical History:  Procedure Laterality Date  . Colonoscopy     per patient around 2014 in [place]   . ESOPHAGOGASTRODUODENOSCOPY (EGD) WITH PROPOFOL [name] 12/28/2015   Procedure: ESOPHAGOGASTRODUODENOSCOPY (EGD) WITH PROPOFOL;  Surgeon: Danie Binder, MD;  Location: [facility];  Service: Endoscopy;  Laterality: [name];    Family History  Problem Relation Age of Onset  . Colon cancer Neg Hx   . Liver disease Neg Hx     Social History:  reports that he has been smoking Cigarettes.  He has been smoking about 0.25 packs per day. He has never used smokeless tobacco. He reports that he drinks about 3.6 oz of alcohol per week . He reports that he does not use drugs.  Allergies: No Known Allergies  Medications: I have reviewed the patient's current medications.  Results for orders placed or performed during the hospital encounter of 01/03/16 (from the past 48 hour(s))  Protime-INR     Status: Abnormal    Collection Time: 01/08/16  2:55 PM  Result Value Ref Range   Prothrombin Time 24.6 (H) 11.4 - 15.2 seconds   INR 2.18   Ferritin     Status: None   Collection Time: 01/08/16  2:55 PM  Result Value Ref Range   Ferritin 32 24 - 336 ng/mL    Comment: Performed at [facility]  Iron and TIBC     Status: Abnormal   Collection Time: 01/08/16  2:55 PM  Result Value Ref Range   Iron 18 (L) 45 - 182 ug/dL   TIBC 220 (L) 250 - 450 ug/dL   Saturation Ratios 8 (L) 17.9 - 39.5 %   UIBC 202 ug/dL    Comment: Performed at [facility]  Haptoglobin     Status: Abnormal   Collection Time: 01/08/16  2:55 PM  Result Value Ref Range   Haptoglobin 15 (L) 34 - 200 mg/dL    Comment: (NOTE) Performed At: [facility] [place], [place] [id number] Lindon Romp MD [id number]   Folate     Status: None   Collection Time: 01/08/16  2:55 PM  Result Value Ref Range   Folate 12.3 >5.9 ng/mL    Comment: Performed at [facility]  Save smear     Status: None   Collection Time: 01/08/16  2:55 PM  Result Value Ref Range   Smear Review SMEAR STAINED AND AVAILABLE FOR REVIEW   Comprehensive metabolic panel     Status: Abnormal   Collection  Time: 01/09/16  4:00 AM  Result Value Ref Range   Sodium 130 (L) 135 - 145 mmol/L   Potassium 3.6 3.5 - 5.1 mmol/L   Chloride 101 101 - 111 mmol/L   CO2 24 22 - 32 mmol/L   Glucose, Bld 92 65 - 99 mg/dL   BUN 38 (H) 6 - 20 mg/dL   Creatinine, Ser 1.55 (H) 0.61 - 1.24 mg/dL   Calcium 7.8 (L) 8.9 - 10.3 mg/dL   Total Protein 6.4 (L) 6.5 - 8.1 g/dL   Albumin 2.5 (L) 3.5 - 5.0 g/dL   AST 71 (H) 15 - 41 U/L   ALT 36 17 - 63 U/L   Alkaline Phosphatase 142 (H) 38 - 126 U/L   Total Bilirubin 4.5 (H) 0.3 - 1.2 mg/dL   GFR calc non Af Amer 53 (L) >60 mL/min   GFR calc Af Amer >60 >60 mL/min    Comment: (NOTE) The eGFR has been calculated using the CKD EPI equation. This calculation has not been validated in all clinical  situations. eGFR's persistently <60 mL/min signify possible Chronic Kidney Disease.    Anion gap 5 5 - 15  CBC     Status: Abnormal   Collection Time: 01/09/16  4:00 AM  Result Value Ref Range   WBC 13.1 (H) 4.0 - 10.5 K/uL    Comment: WHITE COUNT CONFIRMED ON SMEAR   RBC 2.54 (L) 4.22 - 5.81 MIL/uL   Hemoglobin 7.4 (L) 13.0 - 17.0 g/dL   HCT 23.1 (L) 39.0 - 52.0 %   MCV 90.9 78.0 - 100.0 fL   MCH 29.1 26.0 - 34.0 pg   MCHC 32.0 30.0 - 36.0 g/dL   RDW 18.3 (H) 11.5 - 15.5 %   Platelets 104 (L) 150 - 400 K/uL    Comment: PLATELETS APPEAR DECREASED  AFP tumor marker     Status: None   Collection Time: 01/09/16  4:00 AM  Result Value Ref Range   AFP-Tumor Marker 4.3 0.0 - 8.3 ng/mL    Comment: (NOTE) [employer] ECLIA methodology Performed At: [facility] [place], [place] [id number] Lindon Romp MD [id number]   Lactate dehydrogenase     Status: Abnormal   Collection Time: 01/09/16  4:00 AM  Result Value Ref Range   LDH 212 (H) 98 - 192 U/L  Protime-INR     Status: Abnormal   Collection Time: 01/09/16  3:19 PM  Result Value Ref Range   Prothrombin Time 22.6 (H) 11.4 - 15.2 seconds   INR 4.00   Basic metabolic panel     Status: Abnormal   Collection Time: 01/10/16  6:29 AM  Result Value Ref Range   Sodium 132 (L) 135 - 145 mmol/L   Potassium 4.2 3.5 - 5.1 mmol/L   Chloride 100 (L) 101 - 111 mmol/L   CO2 26 22 - 32 mmol/L   Glucose, Bld 80 65 - 99 mg/dL   BUN 42 (H) 6 - 20 mg/dL   Creatinine, Ser 1.62 (H) 0.61 - 1.24 mg/dL   Calcium 8.5 (L) 8.9 - 10.3 mg/dL   GFR calc non Af Amer 50 (L) >60 mL/min   GFR calc Af Amer 59 (L) >60 mL/min    Comment: (NOTE) The eGFR has been calculated using the CKD EPI equation. This calculation has not been validated in all clinical situations. eGFR's persistently <60 mL/min signify possible Chronic Kidney Disease.    Anion gap 6 5 - 15  CBC  Status: Abnormal   Collection Time: 01/10/16  6:29 AM  Result  Value Ref Range   WBC 11.7 (H) 4.0 - 10.5 K/uL   RBC 2.49 (L) 4.22 - 5.81 MIL/uL   Hemoglobin 7.3 (L) 13.0 - 17.0 g/dL   HCT 22.7 (L) 39.0 - 52.0 %   MCV 91.2 78.0 - 100.0 fL   MCH 29.3 26.0 - 34.0 pg   MCHC 32.2 30.0 - 36.0 g/dL   RDW 18.4 (H) 11.5 - 15.5 %   Platelets 108 (L) 150 - 400 K/uL    Comment: SPECIMEN CHECKED FOR CLOTS CONSISTENT WITH PREVIOUS RESULT   Ammonia     Status: Abnormal   Collection Time: 01/10/16 11:43 AM  Result Value Ref Range   Ammonia 140 (H) 9 - 35 umol/L    US Venous Img Upper Uni Right  Result Date: 01/09/2016 CLINICAL DATA:  Right upper extremity edema. EXAM: RIGHT UPPER EXTREMITY VENOUS DOPPLER ULTRASOUND TECHNIQUE: Gray-scale sonography with graded compression, as well as color Doppler and duplex ultrasound were performed to evaluate the upper extremity deep venous system from the level of the subclavian vein and including the jugular, axillary, basilic, radial, ulnar and upper cephalic vein. Spectral Doppler was utilized to evaluate flow at rest and with distal augmentation maneuvers. COMPARISON:  None. FINDINGS: Contralateral Subclavian Vein: Respiratory phasicity is normal and symmetric with the symptomatic side. No evidence of thrombus. Normal compressibility. Internal Jugular Vein: No evidence of thrombus. Normal compressibility, respiratory phasicity and response to augmentation. Subclavian Vein: No evidence of thrombus. Normal compressibility, respiratory phasicity and response to augmentation. Axillary Vein: No evidence of thrombus. Normal compressibility, respiratory phasicity and response to augmentation. Cephalic Vein: No evidence of thrombus. Normal compressibility, respiratory phasicity and response to augmentation. Basilic Vein: No evidence of thrombus. Normal compressibility, respiratory phasicity and response to augmentation. Brachial Veins: No evidence of thrombus. Normal compressibility, respiratory phasicity and response to augmentation.  Radial Veins: No evidence of thrombus. Normal compressibility, respiratory phasicity and response to augmentation. Ulnar Veins: No evidence of thrombus. Normal compressibility, respiratory phasicity and response to augmentation. Venous Reflux:  None visualized. Other Findings: Nonocclusive thrombus is identified in a segment of a vein adjacent to the axillary artery and vein. This either represents a duplicated axillary segment or potentially a large basilic vein extending up into the axilla. This is not the normal position of the cephalic vein. IMPRESSION: Nonocclusive thrombus identified in a segment of vein adjacent to the axillary artery and vein. This either represents DVT in a duplicated axillary vein or potentially superficial thrombophlebitis of an extended basilic vein. Electronically Signed   By: Aletta Edouard M.D.   On: 01/09/2016 12:03   [place] Art/ven Flow Abd Pelv Doppler Limited  Result Date: 01/10/2016 CLINICAL DATA:  Cirrhosis with questionable portal vein thrombosis. EXAM: US ABDOMEN LIMITED - RIGHT UPPER QUADRANT COMPARISON:  None. FINDINGS: Exam somewhat difficult due to patient body habitus. Gallbladder: Mild cholelithiasis with a 1.1 cm stone over the region of the gallbladder neck. Borderline wall thickening measuring 3.4 mm. No pericholecystic fluid. Negative sonographic Murphy sign. Common bile duct: Diameter: 3.0 mm. Liver: Somewhat heterogeneous echotexture with nodular contour compatible with known cirrhosis. No focal mass. Portal vein is patent with normal hepatopetal flow and normal Doppler waveform and velocity of 27 cm/sec. IMPRESSION: Evidence of known cirrhosis. Patent portal vein with normal hepatopetal flow and normal velocity of 27 cm/sec. Mild cholelithiasis with 1.1 cm stone over the region of the gallbladder neck. No additional sonographic evidence to  suggest cholecystitis. Electronically Signed   By: Marin Olp M.D.   On: 01/10/2016 08:48   Dg Chest Port 1  View  Result Date: 01/09/2016 CLINICAL DATA:  Central catheter placement EXAM: PORTABLE CHEST 1 VIEW COMPARISON:  January 03, 2016 FINDINGS: Central catheter tip is in the superior vena cava. Pneumothorax. A small portion of the lateral left base is not visualized. The visualized lungs show no edema or consolidation. Heart is mildly enlarged with pulmonary vascularity within normal limits. No adenopathy. No bone lesions. IMPRESSION: Visualized lung regions are clear. No pneumothorax. Stable cardiomegaly. Central catheter tip in superior vena cava. Electronically Signed   By: Lowella Grip III M.D.   On: 01/09/2016 21:38    Review of Systems  Constitutional: Negative for fever.  Respiratory: Positive for shortness of breath. Negative for cough and hemoptysis.   Cardiovascular: Positive for leg swelling. Negative for orthopnea.  Gastrointestinal: Negative for abdominal pain, nausea and vomiting.  Neurological: Positive for weakness.   Blood pressure 118/64, pulse 66, temperature 98.3 F (36.8 C), temperature source Oral, resp. rate 15, height '6\' 2"'$  (1.88 m), weight (!) 179.9 kg (396 lb 9.6 oz), SpO2 100 %. Physical Exam  Constitutional: He is oriented to person, place, and time. He appears distressed.  Eyes: No scleral icterus.  Neck: JVD present.  Cardiovascular: Normal rate and regular rhythm.   Respiratory:  Decreath breath sounds bilaterally  GI: He exhibits distension. There is no tenderness. There is no rebound.  Musculoskeletal: He exhibits edema.  Neurological: He is alert and oriented to person, place, and time.    Assessment/Plan: Problem #1 acute kidney injury: Most likely prerenal syndrome / ATN hepatorenal cannot be ruled out..Presently his creatinine seems to be slowly increasing. Problem #2 anasarca: Patient with significant leg swelling. He claims his breathing is somewhat better. Patient doesn't seem to have lost significant weight since his admission. He is on Lasix and  albumin. Patient is nonoliguric Problem #3 morbid obesity Problem #4 anemia: Patient has received blood transfusion and his hemoglobin has improved Problem #5 history of thrombocytopenia Problem #6 history of GERD Plan: We'll check urine sodium and creatinine We'll increase Lasix to 1 mg IV twice a day We'll continue with albumin We'll check renal panel in the morning.  Scarlettrose Costilow S 01/10/2016, 2:04 PM

## 2016-01-10 NOTE — Progress Notes (Signed)
PROGRESS NOTE    Antonio [name]  [id number] DOB: 03-08-1971 DOA: 01/03/2016 PCP: Deloria Lair, MD    Brief Narrative:  89 yom with hx of cirrhosis of liver, alcohol abuse, GERD, morbid obesity, and anemia of chronic disease, was recently discharged on 8/5 after being treated for a GI bleed when he received 2 units of PRBCs. He returned to the hospital on 8/9 with complaints of progressively worsening swelling in his legs and SOB and generalized anasarca. ECHO showed EF of 60-65%. Swelling was felt to be related to decompensation of cirrhosis. He is on diuretics and albumin, but unfortunately, diuresis has been unimpressive and creatinine trending up. Nephrology consulted. He has also been noted to be anemic, and has received 3 units pRBCs thus far. Hematology following for further anemia work up (?hemolytic component). His prognosis appears to be poor and if he fails to improve, will likely need palliative care consult.   Assessment & Plan:   Principal Problem:   Decompensation of cirrhosis of liver (HCC) Active Problems:   Anemia, unspecified   Alcohol abuse   Alcoholic cirrhosis of liver with ascites (HCC)   DOE (dyspnea on exertion)   Peripheral edema   Anasarca   Thrombocytopenia (HCC)  1. Decompensation of cirrhosis of liver. Patient continues to have evidence of massive volume overload.  Albumin is low at 2.0. He has been started on albumin infusions to help with efficacy of lasix. Continue aldactone. Will restrict fluid intake and continue to monitor input and output. Continue low sodium diet. Continue IV lasix and metolozone. 2. Acute renal failure. Creatinine trending up with diuresis. ?element of hepatorenal syndrome. Nephrology consulted to assist with diuresis. 3. Alcoholic cirrhosis. Continue nadolol and xifaxan. Abd ultrasound done today shows patent portal vein. 4. Alcohol abuse. Has been alcohol free for >3 weeks. Continue thiamine.  5. Hepatic encephalopathy. Patient  is more somnolent today. Ammonia noted to be elevated at 140. Started on lactulose TID 6. Anemia. Anemia panel indicates iron deficiency. He was transfused 3 units  pRBC with improvement of hemoglobin by only 1 gram. No evidence of gross bleeding at this time. ?if there is a hemolytic process. Haptoglobin in process. LDH mildly elevated. Hematology consulted. 7. GERD. Continue PPI 8. Morbid obesity. 9. Thrombocytopenia. Likely related to cirrhosis 10. Possible DVT in RUE. Patient's Mid line in right arm infiltrated resulting in swelling. Venous dopplers could not rule out DVT. Regardless, with his significant anemia and underlying coagulopathy with INR of 2, he does not seem to be a candidate for anticoagulation 11. Coagulopathy. Patient's INR is 1.9 related to liver disease    DVT prophylaxis: SCDs Code Status: Full  Family Communication: I had an honest discussion with patient's wife regarding the patient's prognosis and complexity of his case. We discussed the possibility that the patient may not recover and may continue to get worse. She says she understood, but wished to continue with current treatments. If patient continues to decline, his wife is agreeable to a palliative care consult.  Disposition Plan: Discharge home once improved    Consultants:   Gastroenterology   Hematology   nephrology  Procedures:   1 unit pRBC transfusion 8/12  2 units pRBC transfusion 8/13  Mid line placed 8/12  ECHO Study Conclusions  - Left ventricle: The cavity size was normal. Wall thickness was increased in a pattern of moderate LVH. Systolic function was normal. The estimated ejection fraction was in the range of 60% to 65%. Diastolic function is abnormal, indeterminate grade.  Wall motion was normal; there were no regional wall motion abnormalities. - Aortic valve: Valve area (VTI): 3.9 cm^2. Valve area (Vmax): 3.74 cm^2. Valve area (Vmean): 4.11 cm^2. - Left atrium: The  atrium was mildly dilated. - Technically adequate study.  Antimicrobials:   None    Subjective: Patient is somnolent today, but wakes up to voice. Feels that breathing is about the same. Feels swelling may be improving.  Objective: Vitals:   01/09/16 1500 01/09/16 1930 01/09/16 2245 01/10/16 0609  BP: 102/62  112/65 (!) 123/47  Pulse: 71  73 68  Resp: 18  15 15   Temp: 97.7 F (36.5 C)  97.6 F (36.4 C) 97.6 F (36.4 C)  TempSrc: Oral  Oral Oral  SpO2: 100% 100% 100% 99%  Weight:    (!) 179.9 kg (396 lb 9.6 oz)  Height:        Intake/Output Summary (Last 24 hours) at 01/10/16 0654 Last data filed at 01/09/16 1713  Gross per 24 hour  Intake              720 ml  Output                0 ml  Net              720 ml   Filed Weights   01/06/16 0500 01/08/16 0556 01/10/16 0609  Weight: (!) 177.1 kg (390 lb 7 oz) (!) 181.8 kg (400 lb 14.4 oz) (!) 179.9 kg (396 lb 9.6 oz)    Examination:  General exam: Appears calm and comfortable  Respiratory system: Clear to auscultation. Respiratory effort normal. Cardiovascular system: S1 & S2 heard, RRR. No JVD, murmurs, rubs, gallops or clicks. 3+ pedal edema with generalized anasarca. Gastrointestinal system: Abdomen is obese, soft and nontender. No organomegaly or masses felt. Normal bowel sounds heard. Central nervous system: somnolent but wakes to voice. No focal neurological deficits. Extremities: Symmetric 5 x 5 power. Skin: No rashes, lesions or ulcers Psychiatry: Judgement and insight appear normal. Mood & affect appropriate.     Data Reviewed: I have personally reviewed following labs and imaging studies  CBC:  Recent Labs Lab 01/03/16 2200 01/04/16 0308 01/07/16 0643 01/08/16 0945 01/09/16 0400  WBC 13.5* 13.8* 14.7* 12.5* 13.1*  NEUTROABS 8.3*  --   --   --   --   HGB 7.3* 7.4* 6.6* 6.2* 7.4*  HCT 23.3* 24.3* 21.1* 20.0* 23.1*  MCV 98.3 98.4 95.0 93.0 90.9  PLT 103* 116* 109* 104* [id number]*   Basic Metabolic  Panel:  Recent Labs Lab 01/04/16 0308 01/06/16 0638 01/07/16 0643 01/08/16 0945 01/09/16 0400  NA 130* 133* 131* 130* 130*  K 4.4 3.8 3.8 3.6 3.6  CL 107 102 103 101 101  CO2 22 23 25 24 24   GLUCOSE 101* 94 111* 140* 92  BUN 21* 25* 27* 33* 38*  CREATININE 1.02 1.20 1.26* 1.41* 1.55*  CALCIUM 7.9* 8.1* 7.5* 7.4* 7.8*  MG 1.3*  --   --   --   --    GFR: Estimated Creatinine Clearance: 105.4 mL/min (by C-G formula based on SCr of 1.55 mg/dL). Liver Function Tests:  Recent Labs Lab 01/03/16 2200 01/08/16 0945 01/09/16 0400  AST 72* 70* 71*  ALT 40 36 36  ALKPHOS 165* 147* 142*  BILITOT 3.2* 3.5* 4.5*  PROT 5.9* 6.0* 6.4*  ALBUMIN 1.5* 2.0* 2.5*   No results for input(s): LIPASE, AMYLASE in the last 168 hours. No results for input(s): AMMONIA  in the last 168 hours. Coagulation Profile:  Recent Labs Lab 01/03/16 2200 01/08/16 1455 01/09/16 1519  INR 2.10 2.18 1.96   Cardiac Enzymes: No results for input(s): CKTOTAL, CKMB, CKMBINDEX, TROPONINI in the last 168 hours. BNP (last 3 results) No results for input(s): PROBNP in the last 8760 hours. HbA1C: No results for input(s): HGBA1C in the last 72 hours. CBG: No results for input(s): GLUCAP in the last 168 hours. Lipid Profile: No results for input(s): CHOL, HDL, LDLCALC, TRIG, CHOLHDL, LDLDIRECT in the last 72 hours. Thyroid Function Tests: No results for input(s): TSH, T4TOTAL, FREET4, T3FREE, THYROIDAB in the last 72 hours. Anemia Panel:  Recent Labs  01/08/16 1455  FOLATE 12.3  FERRITIN 32  TIBC 220*  IRON 18*   Sepsis Labs: No results for input(s): PROCALCITON, LATICACIDVEN in the last 168 hours.  No results found for this or any previous visit (from the past 240 hour(s)).       Radiology Studies: US Venous Img Upper Uni Right  Result Date: 01/09/2016 CLINICAL DATA:  Right upper extremity edema. EXAM: RIGHT UPPER EXTREMITY VENOUS DOPPLER ULTRASOUND TECHNIQUE: Gray-scale sonography with  graded compression, as well as color Doppler and duplex ultrasound were performed to evaluate the upper extremity deep venous system from the level of the subclavian vein and including the jugular, axillary, basilic, radial, ulnar and upper cephalic vein. Spectral Doppler was utilized to evaluate flow at rest and with distal augmentation maneuvers. COMPARISON:  None. FINDINGS: Contralateral Subclavian Vein: Respiratory phasicity is normal and symmetric with the symptomatic side. No evidence of thrombus. Normal compressibility. Internal Jugular Vein: No evidence of thrombus. Normal compressibility, respiratory phasicity and response to augmentation. Subclavian Vein: No evidence of thrombus. Normal compressibility, respiratory phasicity and response to augmentation. Axillary Vein: No evidence of thrombus. Normal compressibility, respiratory phasicity and response to augmentation. Cephalic Vein: No evidence of thrombus. Normal compressibility, respiratory phasicity and response to augmentation. Basilic Vein: No evidence of thrombus. Normal compressibility, respiratory phasicity and response to augmentation. Brachial Veins: No evidence of thrombus. Normal compressibility, respiratory phasicity and response to augmentation. Radial Veins: No evidence of thrombus. Normal compressibility, respiratory phasicity and response to augmentation. Ulnar Veins: No evidence of thrombus. Normal compressibility, respiratory phasicity and response to augmentation. Venous Reflux:  None visualized. Other Findings: Nonocclusive thrombus is identified in a segment of a vein adjacent to the axillary artery and vein. This either represents a duplicated axillary segment or potentially a large basilic vein extending up into the axilla. This is not the normal position of the cephalic vein. IMPRESSION: Nonocclusive thrombus identified in a segment of vein adjacent to the axillary artery and vein. This either represents DVT in a duplicated  axillary vein or potentially superficial thrombophlebitis of an extended basilic vein. Electronically Signed   By: Aletta Edouard M.D.   On: 01/09/2016 12:03   Dg Chest Port 1 View  Result Date: 01/09/2016 CLINICAL DATA:  Central catheter placement EXAM: PORTABLE CHEST 1 VIEW COMPARISON:  January 03, 2016 FINDINGS: Central catheter tip is in the superior vena cava. Pneumothorax. A small portion of the lateral left base is not visualized. The visualized lungs show no edema or consolidation. Heart is mildly enlarged with pulmonary vascularity within normal limits. No adenopathy. No bone lesions. IMPRESSION: Visualized lung regions are clear. No pneumothorax. Stable cardiomegaly. Central catheter tip in superior vena cava. Electronically Signed   By: Lowella Grip III M.D.   On: 01/09/2016 21:38        Scheduled Meds: .  sodium chloride   Intravenous Once  . sodium chloride   Intravenous Once  . albumin human  50 g Intravenous Daily  . folic acid  1 mg Oral Daily  . nadolol  40 mg Oral BID  . pantoprazole  40 mg Oral BID AC  . rifaximin  550 mg Oral BID  . sodium chloride flush  3 mL Intravenous Q12H  . thiamine  100 mg Oral Daily   Continuous Infusions:    LOS: 6 days    Time spent: 25 minutes     Kathie Dike, MD [facility] If 7PM-7AM, please contact night-coverage [email or web address] Password [facility] 01/10/2016, 6:54 AM

## 2016-01-10 NOTE — Progress Notes (Signed)
Ammonia elevated at 140. Schedule lactose dosing so that he is having 3 soft BMs a day. Continue Xifaxan BID.   Orvil Feil, ANP-BC [facility]

## 2016-01-11 DIAGNOSIS — D689 Coagulation defect, unspecified: Secondary | ICD-10-CM | POA: Diagnosis present

## 2016-01-11 LAB — PROTIME-INR
INR: 2.15
Prothrombin Time: 24.4 seconds — ABNORMAL HIGH (ref 11.4–15.2)

## 2016-01-11 LAB — RENAL FUNCTION PANEL
ANION GAP: 7 (ref 5–15)
Albumin: 2.8 g/dL — ABNORMAL LOW (ref 3.5–5.0)
BUN: 48 mg/dL — ABNORMAL HIGH (ref 6–20)
CALCIUM: 8.5 mg/dL — AB (ref 8.9–10.3)
CHLORIDE: 100 mmol/L — AB (ref 101–111)
CO2: 26 mmol/L (ref 22–32)
CREATININE: 2.01 mg/dL — AB (ref 0.61–1.24)
GFR, EST AFRICAN AMERICAN: 45 mL/min — AB (ref >60)
GFR, EST NON AFRICAN AMERICAN: 39 mL/min — AB (ref >60)
Glucose, Bld: 114 mg/dL — ABNORMAL HIGH (ref 65–99)
Phosphorus: 5.4 mg/dL — ABNORMAL HIGH (ref 2.5–4.6)
Potassium: 3.5 mmol/L (ref 3.5–5.1)
SODIUM: 133 mmol/L — AB (ref 135–145)

## 2016-01-11 LAB — CBC
HCT: 21.8 % — ABNORMAL LOW (ref 39.0–52.0)
HEMOGLOBIN: 7.2 g/dL — AB (ref 13.0–17.0)
MCH: 30.4 pg (ref 26.0–34.0)
MCHC: 33 g/dL (ref 30.0–36.0)
MCV: 92 fL (ref 78.0–100.0)
PLATELETS: 90 10*3/uL — AB (ref 150–400)
RBC: 2.37 MIL/uL — ABNORMAL LOW (ref 4.22–5.81)
RDW: 18.4 % — ABNORMAL HIGH (ref 11.5–15.5)
WBC: 10.3 10*3/uL (ref 4.0–10.5)

## 2016-01-11 LAB — PATHOLOGIST SMEAR REVIEW

## 2016-01-11 LAB — GLUCOSE 6 PHOSPHATE DEHYDROGENASE
G6PDH: 15.5 U/g{Hb} — AB (ref 4.6–13.5)
HEMOGLOBIN: 6.7 g/dL — AB (ref 12.6–17.7)

## 2016-01-11 MED ORDER — VITAMIN K1 10 MG/ML IJ SOLN
2.0000 mg | Freq: Every day | INTRAMUSCULAR | Status: AC
  Administered 2016-01-11 – 2016-01-13 (×3): 2 mg via SUBCUTANEOUS
  Filled 2016-01-11 (×4): qty 1

## 2016-01-11 NOTE — Progress Notes (Signed)
Subjective:  Patient without complaints. No difficulty urinating.   Objective: Vital signs in last 24 hours: Temp:  [97.6 F (36.4 C)-98.3 F (36.8 C)] 97.6 F (36.4 C) (08/16 0500) Pulse Rate:  [66-71] 68 (08/16 0500) Resp:  [18-20] 18 (08/16 0500) BP: (113-120)/(52-64) 113/57 (08/16 0500) SpO2:  [96 %-100 %] 100 % (08/16 0500) Weight:  [408 lb (185.1 kg)] 408 lb (185.1 kg) (08/16 0500) Last BM Date: 01/06/16 General:   Alert,  Well-developed, well-nourished, pleasant and cooperative in NAD Head:  Normocephalic and atraumatic. Eyes:  Sclera clear, no icterus.  Abdomen:  Soft, nontender obsese Normal bowel sounds, without guarding, and without rebound.   Extremities:  Without clubbing, deformity. 2-3+ edema entire lower ext and 2+ edema of abd wall, 2+ edema of upper ext. Neurologic:  Alert and  oriented x4;  grossly normal neurologically. NO ASTERIXIS Skin:  Intact without significant lesions or rashes. Psych:  Alert and cooperative. Normal mood and affect.  Intake/Output from previous day: 08/15 0701 - 08/16 0700 In: 1160 [P.O.:960; IV Piggyback:200] Out: 850 [Urine:850] Intake/Output this shift: No intake/output data recorded.  Lab Results: CBC  Recent Labs  01/09/16 0400 01/10/16 0629 01/11/16 0713  WBC 13.1* 11.7* 10.3  HGB 7.4* 7.3* 7.2*  HCT 23.1* 22.7* 21.8*  MCV 90.9 91.2 92.0  PLT 104* 108* 90*   BMET  Recent Labs  01/09/16 0400 01/10/16 0629 01/11/16 0714  NA 130* 132* 133*  K 3.6 4.2 3.5  CL 101 100* 100*  CO2 24 26 26   GLUCOSE 92 80 114*  BUN 38* 42* 48*  CREATININE 1.55* 1.62* 2.01*  CALCIUM 7.8* 8.5* 8.5*   LFTs  Recent Labs  01/09/16 0400 01/11/16 0714  BILITOT 4.5*  --   ALKPHOS 142*  --   AST 71*  --   ALT 36  --   PROT 6.4*  --   ALBUMIN 2.5* 2.8*   No results for input(s): LIPASE in the last 72 hours. PT/INR  Recent Labs  01/08/16 1455 01/09/16 1519 01/11/16 0713  LABPROT 24.6* 22.6* 24.4*  INR 2.18 1.96 2.15       Imaging Studies: Dg Chest 2 View  Result Date: 01/03/2016 CLINICAL DATA:  45 y/o male here with c/o SOB, cough x2 days. Leg edema also noted by pt. Denies hx asthma, COPD, MI, HTN, DM, etc etc. Non-smoker EXAM: CHEST  2 VIEW COMPARISON:  12/26/2015 FINDINGS: Heart size is upper normal. There are no focal consolidations or pleural effusions. No pulmonary edema. Visualized osseous structures have a normal appearance. IMPRESSION: No evidence for acute cardiopulmonary abnormality. Electronically Signed   By: Nolon Nations M.D.   On: 01/03/2016 22:03   Dg Chest 2 View  Result Date: 12/27/2015 CLINICAL DATA:  Intermittent shortness of breath, bilateral lower extremity swelling for 1-1/2 weeks. History of liver disease. EXAM: CHEST  2 VIEW COMPARISON:  None. FINDINGS: Cardiomediastinal silhouette is unremarkable for this low inspiratory examination with crowded vasculature markings. The lungs are clear without pleural effusions or focal consolidations. Trachea projects midline and there is no pneumothorax. Included soft tissue planes and osseous structures are non-suspicious. Abdominal distention noted on the lateral radiograph. IMPRESSION: No active cardiopulmonary disease. Electronically Signed   By: Elon Alas M.D.   On: 12/27/2015 00:07   US Venous Img Upper Uni Right  Result Date: 01/09/2016 CLINICAL DATA:  Right upper extremity edema. EXAM: RIGHT UPPER EXTREMITY VENOUS DOPPLER ULTRASOUND TECHNIQUE: Gray-scale sonography with graded compression, as well as color Doppler and duplex  ultrasound were performed to evaluate the upper extremity deep venous system from the level of the subclavian vein and including the jugular, axillary, basilic, radial, ulnar and upper cephalic vein. Spectral Doppler was utilized to evaluate flow at rest and with distal augmentation maneuvers. COMPARISON:  None. FINDINGS: Contralateral Subclavian Vein: Respiratory phasicity is normal and symmetric with the symptomatic  side. No evidence of thrombus. Normal compressibility. Internal Jugular Vein: No evidence of thrombus. Normal compressibility, respiratory phasicity and response to augmentation. Subclavian Vein: No evidence of thrombus. Normal compressibility, respiratory phasicity and response to augmentation. Axillary Vein: No evidence of thrombus. Normal compressibility, respiratory phasicity and response to augmentation. Cephalic Vein: No evidence of thrombus. Normal compressibility, respiratory phasicity and response to augmentation. Basilic Vein: No evidence of thrombus. Normal compressibility, respiratory phasicity and response to augmentation. Brachial Veins: No evidence of thrombus. Normal compressibility, respiratory phasicity and response to augmentation. Radial Veins: No evidence of thrombus. Normal compressibility, respiratory phasicity and response to augmentation. Ulnar Veins: No evidence of thrombus. Normal compressibility, respiratory phasicity and response to augmentation. Venous Reflux:  None visualized. Other Findings: Nonocclusive thrombus is identified in a segment of a vein adjacent to the axillary artery and vein. This either represents a duplicated axillary segment or potentially a large basilic vein extending up into the axilla. This is not the normal position of the cephalic vein. IMPRESSION: Nonocclusive thrombus identified in a segment of vein adjacent to the axillary artery and vein. This either represents DVT in a duplicated axillary vein or potentially superficial thrombophlebitis of an extended basilic vein. Electronically Signed   By: Aletta Edouard M.D.   On: 01/09/2016 12:03   [place] Art/ven Flow Abd Pelv Doppler Limited  Result Date: 01/10/2016 CLINICAL DATA:  Cirrhosis with questionable portal vein thrombosis. EXAM: US ABDOMEN LIMITED - RIGHT UPPER QUADRANT COMPARISON:  None. FINDINGS: Exam somewhat difficult due to patient body habitus. Gallbladder: Mild cholelithiasis with a 1.1 cm stone  over the region of the gallbladder neck. Borderline wall thickening measuring 3.4 mm. No pericholecystic fluid. Negative sonographic Murphy sign. Common bile duct: Diameter: 3.0 mm. Liver: Somewhat heterogeneous echotexture with nodular contour compatible with known cirrhosis. No focal mass. Portal vein is patent with normal hepatopetal flow and normal Doppler waveform and velocity of 27 cm/sec. IMPRESSION: Evidence of known cirrhosis. Patent portal vein with normal hepatopetal flow and normal velocity of 27 cm/sec. Mild cholelithiasis with 1.1 cm stone over the region of the gallbladder neck. No additional sonographic evidence to suggest cholecystitis. Electronically Signed   By: Marin Olp M.D.   On: 01/10/2016 08:48   Dg Chest Port 1 View  Result Date: 01/09/2016 CLINICAL DATA:  Central catheter placement EXAM: PORTABLE CHEST 1 VIEW COMPARISON:  January 03, 2016 FINDINGS: Central catheter tip is in the superior vena cava. Pneumothorax. A small portion of the lateral left base is not visualized. The visualized lungs show no edema or consolidation. Heart is mildly enlarged with pulmonary vascularity within normal limits. No adenopathy. No bone lesions. IMPRESSION: Visualized lung regions are clear. No pneumothorax. Stable cardiomegaly. Central catheter tip in superior vena cava. Electronically Signed   By: Lowella Grip III M.D.   On: 01/09/2016 21:38   US Abdomen Limited Ruq  Result Date: 01/10/2016 CLINICAL DATA:  Cirrhosis with questionable portal vein thrombosis. EXAM: US ABDOMEN LIMITED - RIGHT UPPER QUADRANT COMPARISON:  None. FINDINGS: Exam somewhat difficult due to patient body habitus. Gallbladder: Mild cholelithiasis with a 1.1 cm stone over the region of the gallbladder neck. Borderline  wall thickening measuring 3.4 mm. No pericholecystic fluid. Negative sonographic Murphy sign. Common bile duct: Diameter: 3.0 mm. Liver: Somewhat heterogeneous echotexture with nodular contour compatible with  known cirrhosis. No focal mass. Portal vein is patent with normal hepatopetal flow and normal Doppler waveform and velocity of 27 cm/sec. IMPRESSION: Evidence of known cirrhosis. Patent portal vein with normal hepatopetal flow and normal velocity of 27 cm/sec. Mild cholelithiasis with 1.1 cm stone over the region of the gallbladder neck. No additional sonographic evidence to suggest cholecystitis. Electronically Signed   By: Marin Olp M.D.   On: 01/10/2016 08:48  [2 weeks]   Assessment: 45 year old male with history of ETOH cirrhosis, ETOH hepatitis, decompensated liver disease, recently admitted 7/31-8/5 with likely lower GI bleed in the setting of coagulopathy, undergoing EGD during prior admission without stigmata of variceal bleed. Readmitted on 8/8 with anasarca, shortness of breath, worsening anemia. Clinically without signs of overt GI bleeding at this time. Prednisolone therapy for acute ETOH hepatitis from last admission has been discontinued, as he has failed to respond and fluid overload worsened in this setting.   Anemia: without overt GI bleeding. 3 units PRBCs received this admission with only 1 gram improvement. . In setting of coagulopathy, prior overt GI bleeding during recent hospitalization likely from lower GI tract and current persistent anemia from known coagulopathy, chronic disease, chronic blood loss, cannot rule out hemolysis. Hematology on board with labs pending. Iron low, ferritin low normal. Not a candidate for colonoscopy at this time.   Cirrhosis: decompensated. AFP normal. U/s with doppler yesterday with patent portal vein with normal hepatopetal flow, mild cholelithiasis and evidence of cirrhosis. Prednisolone discontinued. MELD NA 28 as of 8/14.  Cr trending up. Nephrology consult appreciated. Some improvement in albumin.   Plan: 1. Continue to monitor closely. Will recheck his labs tomorrow.  2. Continue xifaxan and lactulose.  3. Continue  albumin/lasix.  Laureen Ochs. Bernarda Caffey [facility] [phone] 8/16/201710:14 AM     LOS: 7 days

## 2016-01-11 NOTE — Progress Notes (Signed)
Antonio [name]  MRN: [id number]  DOB/AGE: 1970-09-25 45 y.o.  Primary Care Physician:TAPPER,DAVID B, MD  Admit date: 01/03/2016  Chief Complaint:  Chief Complaint  Patient presents with  . Shortness of Breath    S-Pt presented on  01/03/2016 with  Chief Complaint  Patient presents with  . Shortness of Breath  .    Pt stas he is feeling better   . sodium chloride   Intravenous Once  . sodium chloride   Intravenous Once  . albumin human  50 g Intravenous Daily  . folic acid  1 mg Oral Daily  . furosemide  100 mg Intravenous BID  . lactulose  20 g Oral TID  . nadolol  40 mg Oral BID  . pantoprazole  40 mg Oral BID AC  . rifaximin  550 mg Oral BID  . sodium chloride flush  3 mL Intravenous Q12H  . thiamine  100 mg Oral Daily      Physical Exam: Vital signs in last 24 hours: Temp:  [97.6 F (36.4 C)-98.3 F (36.8 C)] 97.6 F (36.4 C) (08/16 0500) Pulse Rate:  [66-71] 68 (08/16 0500) Resp:  [18-20] 18 (08/16 0500) BP: (113-120)/(52-64) 113/57 (08/16 0500) SpO2:  [96 %-100 %] 100 % (08/16 0500) Weight:  [408 lb (185.1 kg)] 408 lb (185.1 kg) (08/16 0500) Weight change: 11 lb 6.4 oz (5.171 kg) Last BM Date: 01/06/16  Intake/Output from previous day: 08/15 0701 - 08/16 0700 In: 1160 [P.O.:960; IV Piggyback:200] Out: 850 [Urine:850] Total I/O In: 240 [P.O.:240] Out: -    Physical Exam: General- pt is awake,alert, oriented to time place and person Resp- No acute REsp distress, CTA B/L NO Rhonchi CVS- S1S2 regular in rate and rhythm GIT- BS+, soft, distended, morbidly obese EXT- NO LE Edema, No Cyanosis   Lab Results: CBC  Recent Labs  01/09/16 0400 01/10/16 0629  WBC 13.1* 11.7*  HGB 7.4* 7.3*  HCT 23.1* 22.7*  PLT 104* 108*    BMET  Recent Labs  01/09/16 0400 01/10/16 0629  NA 130* 132*  K 3.6 4.2  CL 101 100*  CO2 24 26  GLUCOSE 92 80  BUN 38* 42*  CREATININE 1.55* 1.62*  CALCIUM 7.8* 8.5*   Creat trend 2017   1.2=>1.62     MICRO No results found for this or any previous visit (from the past 240 hour(s)).    Lab Results  Component Value Date   CALCIUM 8.5 (L) 01/10/2016         Impression: 1)Renal  AKI secondary to Prerenal/ATN                AKI possibly Hepatorenal                AKI nearing plateau.    2)HTN  BP stable  Medication- On Diuretics-  3)Anemia HGb low Sec to GI bleeding/Liver issues Possible component of hemolysis GI and Heamtology following  4)Anasarca- on Diuretics  5)Thrombocytopnenia Sec to Liver related issues Primary MD and heam follwoing following  6)Electrolytes  Normokalemic  Hyponatremic     Hypervolemia hyponatremia  7)Acid base Co2 at goal     Plan:  Will continue current care      [place] S 01/11/2016, 6:59 AM

## 2016-01-11 NOTE — Progress Notes (Signed)
Subjective: Patient seen in bed.  No family at the bedside.  Patient very fatigued.  No recent medications provided that would cause this according to medication history.  Objective: Vital signs in last 24 hours: Temp:  [97.6 F (36.4 C)-98.2 F (36.8 C)] 97.6 F (36.4 C) (08/16 0500) Pulse Rate:  [68-69] 68 (08/16 0500) Resp:  [18] 18 (08/16 0500) BP: (113-119)/(57-60) 113/57 (08/16 0500) SpO2:  [96 %-100 %] 100 % (08/16 0500) Weight:  [408 lb (185.1 kg)] 408 lb (185.1 kg) (08/16 0500)  Intake/Output from previous day: 08/15 0800 - 08/16 0759 In: 1160 [P.O.:960; IV Piggyback:200] Out: 850 [Urine:850] Intake/Output this shift: No intake/output data recorded.  General appearance: alert, cooperative, appears stated age, no distress, morbidly obese and slowed mentation Resp: clear to auscultation bilaterally Cardio: regular rate and rhythm GI: abnormal findings:  obese Extremities: edema B/L LE edema with SCDs in place Neuro: lethargic  Lab Results:   Recent Labs  01/10/16 0629 01/11/16 0713  WBC 11.7* 10.3  HGB 7.3* 7.2*  HCT 22.7* 21.8*  PLT 108* 90*   BMET  Recent Labs  01/10/16 0629 01/11/16 0714  NA 132* 133*  K 4.2 3.5  CL 100* 100*  CO2 26 26  GLUCOSE 80 114*  BUN 42* 48*  CREATININE 1.62* 2.01*  CALCIUM 8.5* 8.5*    Studies/Results: [place] Art/ven Flow Abd Pelv Doppler Limited  Result Date: 01/10/2016 CLINICAL DATA:  Cirrhosis with questionable portal vein thrombosis. EXAM: US ABDOMEN LIMITED - RIGHT UPPER QUADRANT COMPARISON:  None. FINDINGS: Exam somewhat difficult due to patient body habitus. Gallbladder: Mild cholelithiasis with a 1.1 cm stone over the region of the gallbladder neck. Borderline wall thickening measuring 3.4 mm. No pericholecystic fluid. Negative sonographic Murphy sign. Common bile duct: Diameter: 3.0 mm. Liver: Somewhat heterogeneous echotexture with nodular contour compatible with known cirrhosis. No focal mass. Portal vein is patent  with normal hepatopetal flow and normal Doppler waveform and velocity of 27 cm/sec. IMPRESSION: Evidence of known cirrhosis. Patent portal vein with normal hepatopetal flow and normal velocity of 27 cm/sec. Mild cholelithiasis with 1.1 cm stone over the region of the gallbladder neck. No additional sonographic evidence to suggest cholecystitis. Electronically Signed   By: Marin Olp M.D.   On: 01/10/2016 08:48   Dg Chest Port 1 View  Result Date: 01/09/2016 CLINICAL DATA:  Central catheter placement EXAM: PORTABLE CHEST 1 VIEW COMPARISON:  January 03, 2016 FINDINGS: Central catheter tip is in the superior vena cava. Pneumothorax. A small portion of the lateral left base is not visualized. The visualized lungs show no edema or consolidation. Heart is mildly enlarged with pulmonary vascularity within normal limits. No adenopathy. No bone lesions. IMPRESSION: Visualized lung regions are clear. No pneumothorax. Stable cardiomegaly. Central catheter tip in superior vena cava. Electronically Signed   By: Lowella Grip III M.D.   On: 01/09/2016 21:38   US Abdomen Limited Ruq  Result Date: 01/10/2016 CLINICAL DATA:  Cirrhosis with questionable portal vein thrombosis. EXAM: US ABDOMEN LIMITED - RIGHT UPPER QUADRANT COMPARISON:  None. FINDINGS: Exam somewhat difficult due to patient body habitus. Gallbladder: Mild cholelithiasis with a 1.1 cm stone over the region of the gallbladder neck. Borderline wall thickening measuring 3.4 mm. No pericholecystic fluid. Negative sonographic Murphy sign. Common bile duct: Diameter: 3.0 mm. Liver: Somewhat heterogeneous echotexture with nodular contour compatible with known cirrhosis. No focal mass. Portal vein is patent with normal hepatopetal flow and normal Doppler waveform and velocity of 27 cm/sec. IMPRESSION: Evidence of  known cirrhosis. Patent portal vein with normal hepatopetal flow and normal velocity of 27 cm/sec. Mild cholelithiasis with 1.1 cm stone over the region  of the gallbladder neck. No additional sonographic evidence to suggest cholecystitis. Electronically Signed   By: Marin Olp M.D.   On: 01/10/2016 08:48    Medications: I have reviewed the patient's current medications.  Assessment/Plan: Normocytic, normochromic anemia requiring PRBC transfusions in the setting of EtOH cirrhosis, esophageal varices,and severe liver disease. Anemia work-up thus far is reviewed.  Ferritin is low normal with low serum iron and saturation ratio.  TIBC is also low. S/P 1 dose of IV feraheme 510 mg.  This can be repeated if necessary in 3-8 days. Of note, if imaging of liver is needed, Feraheme will interfere with accurate readings. 7 units of PRBCs since 12/27/2015 is noted.   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 Reticulocytosis is absent.  DAT negative.  Pathologist smear review is unrevealing.  G6PD is pending.    ?Nonocclusive thrombus in vein adjacent to axillary vein/artery?  Would not anticoagulate at this time given liver disease, INR in therapeutic range without anticoagulation, and difficulty with stopping bleeding.  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  According to [facility], he is not a candidate for liver transplant.  INR is > 2.  Order is placed for 2 mg of Vit K SQ daily x 3.  This may be beneficial.  If no improvement in INR, would consider discontinuing this intervention.  He would benefit from palliative consult.  His prognosis is poor from a liver standpoint.  Hematology will sign off at this time and follow from the periphery.  Please call with any questions or concerns.  Patient and plan discussed with Dr. Oliva Bustard and he is in agreement with the aforementioned.     LOS: 7 days    KEFALAS,THOMAS 01/11/2016  Attending's note Patient was seen by me.  All the lab data had been reviewed.  Patient presents with a complex medical condition  complicated by adrenal insufficiency, chronic liver disease cirrhosis of liver.  Obesity and multiple other medical and comorbid condition. Anemia complicated with bleeding as well as mild  Hemolysis. Patient appeared to be lethargic. Start patient on vitamin K patient's condition declines patient can be transferred to [facility] for management.

## 2016-01-11 NOTE — Progress Notes (Signed)
PROGRESS NOTE    Antonio [name]  [id number] DOB: [birth year] DOA: 01/03/2016 PCP: Deloria Lair, MD  Outpatient Specialists: None  Brief Narrative: 85 yom with hx of alcoholic cirrhosis of liver, alcohol abuse, GERD, morbid obesity, and anemia of chronic disease, was recently discharged a few days ago for a GI bleed after receiving 2 units of PRBCs. He presents today with complaints of progressively worsened swelling in his legs and SOB. While in the ED, he was noted to have volume overload. Pt was readmitted for further management of his swelling and SOB. EKG showed sinus or ectopic atrial rhythm. ECHO showed EF of 60-65%. He was noted to have decompensation of cirrhosis of the liver. Started on IV lasix and albumin to aid diuresis. Diuresis with lasix has been unimpressive thus far, so  metolozone was added. He has been noted to be anemic, and has received 3 units pRBCs thus far.   Assessment & Plan:   Principal Problem:   Decompensation of cirrhosis of liver (HCC) Active Problems:   Alcoholic cirrhosis of liver with ascites (HCC)   Anasarca   Thrombocytopenia (HCC)   Coagulopathy (HCC)   Anemia, unspecified   Alcohol abuse   DOE (dyspnea on exertion)   Peripheral edema   Encephalopathy, hepatic (HCC)   Acute renal failure (HCC)   Decompensation of cirrhosis of liver.  Patient continues to have evidence of massive volume overload.  Albumin is low at 2.0. He has been started on albumin infusions to help with efficacy of lasix. Continue aldactone. Will restrict fluid intake and continue to monitor input and output. Continue low sodium diet. Continue IV lasix and metolozone. Creatinine is trending up, but will likely need to tolerate a higher creatinine in order to achieve adequate diuresis  Alcoholic cirrhosis. Continue nadolol and xifaxan. Abdominal ultrasound on 8/16 revealed evidence of known cirrhosis, patent portal vein with normal hepatopedal flow; mild cholelithiasis but no  sonographic evidence to suggest cholecystitis.  Acute renal failure. Patient's creatinine started trending up with diuresis.? Element of hepatorenal syndrome. Nephrology was consulted to assist with diuresis.  Hepatic encephalopathy. Patient's ammonia level was noted to be elevated. He was started on lactulose. He was continued on rifaximin.   Alcohol abuse.  Has been alcohol free for 3 weeks. No signs of alcohol withdrawal at this time. Continue thiamine.   Anemia.  Anemia panel indicates iron deficiency. He was transfused 3 units  pRBC with improvement of hemoglobin by only 1 gram. No evidence of gross bleeding at this time. ?if there is a hemolytic process. Hematology consulted. Their assessment noted and appreciated. The patient was given 1 dose of Feraheme. Per hematology, it can be repeated in 3-8 days if necessary. There may be an element of hemolysis minimal to mild given his mildly elevated LDH and mildly low haptoglobin and elevated indirect bilirubin. DAT was negative. G6PD is pending. Pathology smear is pending. -We'll hold off on further packed red blood cell transfusions for now.  Severe coagulopathy, secondary to cirrhosis. Order was placed for vitamin K subcutaneous daily 3 days.  Thrombocytopenia. Likely related to cirrhosis. Platelet count not in the transfusion requirement range.  Possible DVT in RUE.  Patient's Mid line infiltrated resulting in swelling. Venous dopplers could not rule out DVT. Regardless, with his significant anemia and underlying coagulopathy with INR of 2, he does seem to be a candidate for anticoagulation. Hematology/oncology agreed.  Morbid obesity GERD. Continue on PPI.  Discussion: Patient's cirrhosis appears to be end-stage. GI, Dr. Oneida Alar had  recommended palliative care and hospice to the patient during a previous hospitalization. Oncology also recommends a palliative care consult. If the patient continues to decline, his wife was agreeable to  a palliative care consult per Dr. Roderic Palau.   DVT prophylaxis: SCDs Code Status: Full Family Communication: Family not available Disposition Plan: Discharge once improved.   Consultants:   Gastroenterology  Procedures:  1 unit pRBC transfusion 8/12  2 units pRBC transfusion 8/13  Mid line placed 8/12; right IJ central line.  ECHO Study Conclusions - Left ventricle: The cavity size was normal. Wall thickness was   increased in a pattern of moderate LVH. Systolic function was   normal. The estimated ejection fraction was in the range of 60%   to 65%. Diastolic function is abnormal, indeterminate grade. Wall   motion was normal; there were no regional wall motion   abnormalities. - Aortic valve: Valve area (VTI): 3.9 cm^2. Valve area (Vmax): 3.74   cm^2. Valve area (Vmean): 4.11 cm^2. - Left atrium: The atrium was mildly dilated. - Technically adequate study.  Antimicrobials:  None   Subjective: Patient has intermittent shortness of breath and a feeling of fluid overload, but no chest pain.   Objective: Vitals:   01/10/16 1300 01/10/16 1947 01/10/16 2100 01/11/16 0500  BP: (!) 120/52  119/60 (!) 113/57  Pulse: 71  69 68  Resp: 20  18 18   Temp: 98.1 F (36.7 C)  98.2 F (36.8 C) 97.6 F (36.4 C)  TempSrc: Oral  Oral Oral  SpO2: 100% 96% 98% 100%  Weight:    (!) 185.1 kg (408 lb)  Height:        Intake/Output Summary (Last 24 hours) at 01/11/16 1650 Last data filed at 01/11/16 1200  Gross per 24 hour  Intake             1280 ml  Output              850 ml  Net              430 ml   Filed Weights   01/08/16 0556 01/10/16 0609 01/11/16 0500  Weight: (!) 181.8 kg (400 lb 14.4 oz) (!) 179.9 kg (396 lb 9.6 oz) (!) 185.1 kg (408 lb)   Examination:  General exam: Appears calm and comfortable  Neck: Right IJ central line noted with blood-tinged dressing. Respiratory system: Clear to auscultation. Respiratory effort normal. Cardiovascular system: S1 & S2 heard,  RRR. No JVD, murmurs, rubs, gallops or clicks. 3+ pedal edema with anasarca. 2-3+ left upper extremity edema and trace to 1+ right upper extremity edema. Gastrointestinal system: Abdomen is distended, soft and nontender. No organomegaly or masses felt. Normal bowel sounds heard. Central nervous system: Mildly lethargic without any obvious cranial nerve deficits.. Extremities: Symmetric 5 x 5 power. Skin: Brawny edema of his upper extremities and lower extremities; tattoo on upper extremities. Psychiatry: Mildly lethargic, but able to answer questions appropriately.   Data Reviewed: I have personally reviewed following labs and imaging studies  CBC:  Recent Labs Lab 01/07/16 0643 01/08/16 0945 01/09/16 0400 01/10/16 0629 01/11/16 0713  WBC 14.7* 12.5* 13.1* 11.7* 10.3  HGB 6.6* 6.2* 7.4* 7.3* 7.2*  HCT 21.1* 20.0* 23.1* 22.7* 21.8*  MCV 95.0 93.0 90.9 91.2 92.0  PLT 109* 104* 104* 108* 90*   Basic Metabolic Panel:  Recent Labs Lab 01/07/16 0643 01/08/16 0945 01/09/16 0400 01/10/16 0629 01/11/16 0714  NA 131* 130* 130* 132* 133*  K 3.8 3.6 3.6  4.2 3.5  CL 103 101 101 100* 100*  CO2 25 24 24 26 26   GLUCOSE 111* 140* 92 80 114*  BUN 27* 33* 38* 42* 48*  CREATININE 1.26* 1.41* 1.55* 1.62* 2.01*  CALCIUM 7.5* 7.4* 7.8* 8.5* 8.5*  PHOS  --   --   --   --  5.4*   GFR: Estimated Creatinine Clearance: 82.7 mL/min (by C-G formula based on SCr of 2.01 mg/dL). Liver Function Tests:  Recent Labs Lab 01/08/16 0945 01/09/16 0400 01/11/16 0714  AST 70* 71*  --   ALT 36 36  --   ALKPHOS 147* 142*  --   BILITOT 3.5* 4.5*  --   PROT 6.0* 6.4*  --   ALBUMIN 2.0* 2.5* 2.8*   Coagulation Profile:  Recent Labs Lab 01/08/16 1455 01/09/16 1519 01/11/16 0713  INR 2.18 1.96 2.15   Urine analysis:    Component Value Date/Time   COLORURINE AMBER (A) 12/27/2015 2229   APPEARANCEUR CLEAR 12/27/2015 2229   LABSPEC 1.010 12/27/2015 2229   PHURINE 6.0 12/27/2015 2229    GLUCOSEU NEGATIVE 12/27/2015 2229   HGBUR LARGE (A) 12/27/2015 2229   BILIRUBINUR MODERATE (A) 12/27/2015 2229   KETONESUR NEGATIVE 12/27/2015 2229   PROTEINUR TRACE (A) 12/27/2015 2229   NITRITE NEGATIVE 12/27/2015 2229   LEUKOCYTESUR NEGATIVE 12/27/2015 2229   Scheduled Meds: . sodium chloride   Intravenous Once  . sodium chloride   Intravenous Once  . albumin human  50 g Intravenous Daily  . folic acid  1 mg Oral Daily  . furosemide  100 mg Intravenous BID  . lactulose  20 g Oral TID  . nadolol  40 mg Oral BID  . pantoprazole  40 mg Oral BID AC  . phytonadione  2 mg Subcutaneous Daily  . rifaximin  550 mg Oral BID  . sodium chloride flush  3 mL Intravenous Q12H  . thiamine  100 mg Oral Daily   Continuous Infusions:    LOS: 7 days   Time spent: 30 minutes  Rexene Alberts, MD [facility]  If 7PM-7AM, please contact night-coverage [email or web address] Password [email or web address] 01/11/2016, 4:50 PM

## 2016-01-12 ENCOUNTER — Encounter (HOSPITAL_COMMUNITY): Payer: Self-pay | Admitting: Gastroenterology

## 2016-01-12 DIAGNOSIS — R609 Edema, unspecified: Secondary | ICD-10-CM

## 2016-01-12 DIAGNOSIS — R600 Localized edema: Secondary | ICD-10-CM | POA: Diagnosis present

## 2016-01-12 LAB — HEPATIC FUNCTION PANEL
ALBUMIN: 3 g/dL — AB (ref 3.5–5.0)
ALT: 32 U/L (ref 17–63)
AST: 69 U/L — ABNORMAL HIGH (ref 15–41)
Alkaline Phosphatase: 111 U/L (ref 38–126)
BILIRUBIN DIRECT: 1.5 mg/dL — AB (ref 0.1–0.5)
BILIRUBIN INDIRECT: 2.5 mg/dL — AB (ref 0.3–0.9)
TOTAL PROTEIN: 6.7 g/dL (ref 6.5–8.1)
Total Bilirubin: 4 mg/dL — ABNORMAL HIGH (ref 0.3–1.2)

## 2016-01-12 LAB — BASIC METABOLIC PANEL
ANION GAP: 6 (ref 5–15)
BUN: 47 mg/dL — ABNORMAL HIGH (ref 6–20)
CALCIUM: 8.9 mg/dL (ref 8.9–10.3)
CO2: 27 mmol/L (ref 22–32)
Chloride: 99 mmol/L — ABNORMAL LOW (ref 101–111)
Creatinine, Ser: 1.7 mg/dL — ABNORMAL HIGH (ref 0.61–1.24)
GFR, EST AFRICAN AMERICAN: 55 mL/min — AB (ref >60)
GFR, EST NON AFRICAN AMERICAN: 48 mL/min — AB (ref >60)
Glucose, Bld: 80 mg/dL (ref 65–99)
Potassium: 3.1 mmol/L — ABNORMAL LOW (ref 3.5–5.1)
SODIUM: 132 mmol/L — AB (ref 135–145)

## 2016-01-12 LAB — CBC
HCT: 22.3 % — ABNORMAL LOW (ref 39.0–52.0)
HEMOGLOBIN: 6.9 g/dL — AB (ref 13.0–17.0)
MCH: 29.4 pg (ref 26.0–34.0)
MCHC: 30.9 g/dL (ref 30.0–36.0)
MCV: 94.9 fL (ref 78.0–100.0)
Platelets: 98 10*3/uL — ABNORMAL LOW (ref 150–400)
RBC: 2.35 MIL/uL — AB (ref 4.22–5.81)
RDW: 18.9 % — ABNORMAL HIGH (ref 11.5–15.5)
WBC: 9.3 10*3/uL (ref 4.0–10.5)

## 2016-01-12 MED ORDER — SPIRONOLACTONE 25 MG PO TABS
50.0000 mg | ORAL_TABLET | Freq: Every day | ORAL | Status: DC
  Administered 2016-01-12 – 2016-01-17 (×6): 50 mg via ORAL
  Filled 2016-01-12 (×6): qty 2

## 2016-01-12 MED ORDER — DEXTROSE 5 % IV SOLN
160.0000 mg | Freq: Two times a day (BID) | INTRAVENOUS | Status: DC
  Administered 2016-01-12 – 2016-01-14 (×5): 160 mg via INTRAVENOUS
  Filled 2016-01-12 (×8): qty 16

## 2016-01-12 MED ORDER — POTASSIUM CHLORIDE CRYS ER 20 MEQ PO TBCR
40.0000 meq | EXTENDED_RELEASE_TABLET | Freq: Three times a day (TID) | ORAL | Status: DC
  Administered 2016-01-12 (×3): 40 meq via ORAL
  Filled 2016-01-12 (×3): qty 2

## 2016-01-12 NOTE — Progress Notes (Signed)
Per MD, pt will receive morning labs this a.m.  And to continue to monitor pt.

## 2016-01-12 NOTE — Progress Notes (Signed)
Subjective:  Feels better today. Tolerating diet. Complains of edema. No sob.  Objective: Vital signs in last 24 hours: Temp:  [98.2 F (36.8 C)-98.5 F (36.9 C)] 98.5 F (36.9 C) (08/17 0617) Pulse Rate:  [70-71] 70 (08/17 0617) Resp:  [18] 18 (08/17 0617) BP: (113-120)/(59-65) 120/59 (08/17 0617) SpO2:  [99 %-100 %] 99 % (08/17 0617) Weight:  [404 lb 8 oz (183.5 kg)] 404 lb 8 oz (183.5 kg) (08/17 0643) Last BM Date: 01/06/16 General:   Alert,  Well-developed, well-nourished, pleasant and cooperative in NAD Head:  Normocephalic and atraumatic. Eyes:  Sclera clear, no icterus.  Abdomen:  Soft, nontender, obese. Pitting edema in dependent portions of abd.   Extremities:  Without clubbing, deformity. 3+ pitting edema to thighs bilaterally.  Neurologic:  Alert and  oriented x4;  grossly normal neurologically. Skin:  Intact without significant lesions or rashes. Psych:  Alert and cooperative. Normal mood and affect.  Intake/Output from previous day: 08/16 0701 - 08/17 0700 In: 1280 [P.O.:720; IV Piggyback:320] Out: -  Intake/Output this shift: No intake/output data recorded.  Lab Results: CBC  Recent Labs  01/10/16 0629 01/11/16 0713  WBC 11.7* 10.3  HGB 7.3* 7.2*  HCT 22.7* 21.8*  MCV 91.2 92.0  PLT 108* 90*   BMET  Recent Labs  01/10/16 0629 01/11/16 0714 01/12/16 0629  NA 132* 133* 132*  K 4.2 3.5 3.1*  CL 100* 100* 99*  CO2 26 26 27   GLUCOSE 80 114* 80  BUN 42* 48* 47*  CREATININE 1.62* 2.01* 1.70*  CALCIUM 8.5* 8.5* 8.9   LFTs  Recent Labs  01/11/16 0714 01/12/16 0629  BILITOT  --  4.0*  BILIDIR  --  1.5*  IBILI  --  2.5*  ALKPHOS  --  111  AST  --  69*  ALT  --  32  PROT  --  6.7  ALBUMIN 2.8* 3.0*   No results for input(s): LIPASE in the last 72 hours. PT/INR  Recent Labs  01/09/16 1519 01/11/16 0713  LABPROT 22.6* 24.4*  INR 1.96 2.15      Imaging Studies: Dg Chest 2 View  Result Date: 01/03/2016 CLINICAL DATA:  45 y/o male  here with c/o SOB, cough x2 days. Leg edema also noted by pt. Denies hx asthma, COPD, MI, HTN, DM, etc etc. Non-smoker EXAM: CHEST  2 VIEW COMPARISON:  12/26/2015 FINDINGS: Heart size is upper normal. There are no focal consolidations or pleural effusions. No pulmonary edema. Visualized osseous structures have a normal appearance. IMPRESSION: No evidence for acute cardiopulmonary abnormality. Electronically Signed   By: Nolon Nations M.D.   On: 01/03/2016 22:03   Dg Chest 2 View  Result Date: 12/27/2015 CLINICAL DATA:  Intermittent shortness of breath, bilateral lower extremity swelling for 1-1/2 weeks. History of liver disease. EXAM: CHEST  2 VIEW COMPARISON:  None. FINDINGS: Cardiomediastinal silhouette is unremarkable for this low inspiratory examination with crowded vasculature markings. The lungs are clear without pleural effusions or focal consolidations. Trachea projects midline and there is no pneumothorax. Included soft tissue planes and osseous structures are non-suspicious. Abdominal distention noted on the lateral radiograph. IMPRESSION: No active cardiopulmonary disease. Electronically Signed   By: Elon Alas M.D.   On: 12/27/2015 00:07   US Venous Img Upper Uni Right  Result Date: 01/09/2016 CLINICAL DATA:  Right upper extremity edema. EXAM: RIGHT UPPER EXTREMITY VENOUS DOPPLER ULTRASOUND TECHNIQUE: Gray-scale sonography with graded compression, as well as color Doppler and duplex ultrasound were performed to  evaluate the upper extremity deep venous system from the level of the subclavian vein and including the jugular, axillary, basilic, radial, ulnar and upper cephalic vein. Spectral Doppler was utilized to evaluate flow at rest and with distal augmentation maneuvers. COMPARISON:  None. FINDINGS: Contralateral Subclavian Vein: Respiratory phasicity is normal and symmetric with the symptomatic side. No evidence of thrombus. Normal compressibility. Internal Jugular Vein: No evidence of  thrombus. Normal compressibility, respiratory phasicity and response to augmentation. Subclavian Vein: No evidence of thrombus. Normal compressibility, respiratory phasicity and response to augmentation. Axillary Vein: No evidence of thrombus. Normal compressibility, respiratory phasicity and response to augmentation. Cephalic Vein: No evidence of thrombus. Normal compressibility, respiratory phasicity and response to augmentation. Basilic Vein: No evidence of thrombus. Normal compressibility, respiratory phasicity and response to augmentation. Brachial Veins: No evidence of thrombus. Normal compressibility, respiratory phasicity and response to augmentation. Radial Veins: No evidence of thrombus. Normal compressibility, respiratory phasicity and response to augmentation. Ulnar Veins: No evidence of thrombus. Normal compressibility, respiratory phasicity and response to augmentation. Venous Reflux:  None visualized. Other Findings: Nonocclusive thrombus is identified in a segment of a vein adjacent to the axillary artery and vein. This either represents a duplicated axillary segment or potentially a large basilic vein extending up into the axilla. This is not the normal position of the cephalic vein. IMPRESSION: Nonocclusive thrombus identified in a segment of vein adjacent to the axillary artery and vein. This either represents DVT in a duplicated axillary vein or potentially superficial thrombophlebitis of an extended basilic vein. Electronically Signed   By: Aletta Edouard M.D.   On: 01/09/2016 12:03   [place] Art/ven Flow Abd Pelv Doppler Limited  Result Date: 01/10/2016 CLINICAL DATA:  Cirrhosis with questionable portal vein thrombosis. EXAM: US ABDOMEN LIMITED - RIGHT UPPER QUADRANT COMPARISON:  None. FINDINGS: Exam somewhat difficult due to patient body habitus. Gallbladder: Mild cholelithiasis with a 1.1 cm stone over the region of the gallbladder neck. Borderline wall thickening measuring 3.4 mm. No  pericholecystic fluid. Negative sonographic Murphy sign. Common bile duct: Diameter: 3.0 mm. Liver: Somewhat heterogeneous echotexture with nodular contour compatible with known cirrhosis. No focal mass. Portal vein is patent with normal hepatopetal flow and normal Doppler waveform and velocity of 27 cm/sec. IMPRESSION: Evidence of known cirrhosis. Patent portal vein with normal hepatopetal flow and normal velocity of 27 cm/sec. Mild cholelithiasis with 1.1 cm stone over the region of the gallbladder neck. No additional sonographic evidence to suggest cholecystitis. Electronically Signed   By: Marin Olp M.D.   On: 01/10/2016 08:48   Dg Chest Port 1 View  Result Date: 01/09/2016 CLINICAL DATA:  Central catheter placement EXAM: PORTABLE CHEST 1 VIEW COMPARISON:  January 03, 2016 FINDINGS: Central catheter tip is in the superior vena cava. Pneumothorax. A small portion of the lateral left base is not visualized. The visualized lungs show no edema or consolidation. Heart is mildly enlarged with pulmonary vascularity within normal limits. No adenopathy. No bone lesions. IMPRESSION: Visualized lung regions are clear. No pneumothorax. Stable cardiomegaly. Central catheter tip in superior vena cava. Electronically Signed   By: Lowella Grip III M.D.   On: 01/09/2016 21:38   US Abdomen Limited Ruq  Result Date: 01/10/2016 CLINICAL DATA:  Cirrhosis with questionable portal vein thrombosis. EXAM: US ABDOMEN LIMITED - RIGHT UPPER QUADRANT COMPARISON:  None. FINDINGS: Exam somewhat difficult due to patient body habitus. Gallbladder: Mild cholelithiasis with a 1.1 cm stone over the region of the gallbladder neck. Borderline wall thickening measuring 3.4  mm. No pericholecystic fluid. Negative sonographic Murphy sign. Common bile duct: Diameter: 3.0 mm. Liver: Somewhat heterogeneous echotexture with nodular contour compatible with known cirrhosis. No focal mass. Portal vein is patent with normal hepatopetal flow and  normal Doppler waveform and velocity of 27 cm/sec. IMPRESSION: Evidence of known cirrhosis. Patent portal vein with normal hepatopetal flow and normal velocity of 27 cm/sec. Mild cholelithiasis with 1.1 cm stone over the region of the gallbladder neck. No additional sonographic evidence to suggest cholecystitis. Electronically Signed   By: Marin Olp M.D.   On: 01/10/2016 08:48  [2 weeks]   Assessment: 45 year old male with history of ETOH cirrhosis, ETOH hepatitis, decompensated liver disease, recently admitted 7/31-8/5 with likely lower GI bleed in the setting of coagulopathy, undergoing EGD during prior admission without stigmata of variceal bleed. Readmitted on 8/8 with anasarca, shortness of breath, worsening anemia. Clinically without signs of overt GI bleeding at this time. Prednisolone therapy for acute ETOH hepatitis from last admission has been discontinued, as he has failed to respond and fluid overload worsened in this setting.   Anemia: without overt GI bleeding. 3 units PRBCs received this admission with only 1 gram improvement. . In setting of coagulopathy, prior overt GI bleeding during recent hospitalization likely from lower GI tract and current persistent anemia from known coagulopathy, chronic disease, chronic blood loss, cannot rule out hemolysis. Hematology on board with labs pending. Iron low, ferritin low normal. Not a candidate for colonoscopy at this time.   Cirrhosis: decompensated. AFP normal. U/s with doppler with patent portal vein with normal hepatopetal flow, mild cholelithiasis and evidence of cirrhosis.Prednisolone discontinued. MELD NA 28 as of 8/14. Cr improved today.. Nephrology consult appreciated. Some improvement in albumin. Weight is down 4 pounds. Urinary output unmeasured multiple episodes just today.  Plan: 1. Recheck lab parameters tomorrow. 2. Appreciate nephrology input. Modest decline in weight over the last 24 hours. 3. Prognosis poor from a  liver standpoint although modest improvement in albumin with runs, reevaluate with labs in the morning. Patient has not been open to palliative care and prior hospitalizations. Consideration of transfer to [facility] for management if further decline given patient's multiple comorbidities.   Laureen Ochs. Bernarda Caffey [facility] [phone] 8/17/20172:00 PM     LOS: 8 days

## 2016-01-12 NOTE — Progress Notes (Signed)
Lab phoned and notified nurse of a critical hemoglobin of 6.9 from morning labs. Notified MD, awaiting response.

## 2016-01-12 NOTE — Progress Notes (Signed)
Subjective: Interval History: has no complaint of  No difficulty in breathing. Patient overall states that he is feeling better..  Objective: Vital signs in last 24 hours: Temp:  [98.2 F (36.8 C)-98.5 F (36.9 C)] 98.5 F (36.9 C) (08/17 0617) Pulse Rate:  [70-71] 70 (08/17 0617) Resp:  [18] 18 (08/17 0617) BP: (113-120)/(59-65) 120/59 (08/17 0617) SpO2:  [99 %-100 %] 99 % (08/17 0617) Weight:  [183.5 kg (404 lb 8 oz)] 183.5 kg (404 lb 8 oz) (08/17 0643) Weight change: -1.588 kg (-3 lb 8 oz)  Intake/Output from previous day: 08/16 0701 - 08/17 0700 In: 1280 [P.O.:720; IV Piggyback:320] Out: -  Intake/Output this shift: Total I/O In: 240 [P.O.:240] Out: -   General appearance: alert, cooperative and no distress Resp: diminished breath sounds bilaterally Cardio: regular rate and rhythm, S1, S2 normal, no murmur, click, rub or gallop Extremities: edema He has 3+ edema bilaterally  Lab Results:  Recent Labs  01/10/16 0629 01/11/16 0713  WBC 11.7* 10.3  HGB 7.3* 7.2*  HCT 22.7* 21.8*  PLT 108* 90*   BMET:  Recent Labs  01/11/16 0714 01/12/16 0629  NA 133* 132*  K 3.5 3.1*  CL 100* 99*  CO2 26 27  GLUCOSE 114* 80  BUN 48* 47*  CREATININE 2.01* 1.70*  CALCIUM 8.5* 8.9   No results for input(s): PTH in the last 72 hours. Iron Studies: No results for input(s): IRON, TIBC, TRANSFERRIN, FERRITIN in the last 72 hours.  Studies/Results: No results found.  I have reviewed the patient's current medications.  Assessment/Plan:   Problem #1acute renal failure: Most likely prerenal versus ATN. His BUN and creatinine is slightly better. Problem #2 anasarca: Patient on albumin and Lasix. Still patient with significant amount of fluid overload. Problem #3 hypokalemia: Most likely secondary to diuretics. His potassium is 3.1 Problem #4 anemia: His hemoglobin is low but stable Problem #5 hyponatremia: Hypervolemic hyponatremia; Sodium is stable Problem #6liver cirrhosis    plan: We'll increase his Lasix to 160 mg IV twice a day           We'll start patient on Aldactone 50 mg by mouth once a day           We'll start patient on KCl 40 mEq by mouth 3 times a day           We'll check his renal panel in the morning  LOS: 8 days   Antonio [name] S 01/12/2016,9:25 AM

## 2016-01-12 NOTE — Progress Notes (Signed)
PROGRESS NOTE    Antonio [name]  [id number] DOB: Oct 28, 1970 DOA: 01/03/2016 PCP: Deloria Lair, MD  Outpatient Specialists: None  Brief Narrative: 56 yom with hx of alcoholic cirrhosis of liver, alcohol abuse, GERD, morbid obesity, and anemia of chronic disease, was recently discharged a few days ago for a GI bleed after receiving 2 units of PRBCs. He presents today with complaints of progressively worsened swelling in his legs and SOB. While in the ED, he was noted to have volume overload. Pt was readmitted for further management of his swelling and SOB. EKG showed sinus or ectopic atrial rhythm. ECHO showed EF of 60-65%. He was noted to have decompensation of cirrhosis of the liver. Started on IV lasix and albumin to aid diuresis. Diuresis with lasix has been unimpressive thus far, so  metolozone was added. He has been noted to be anemic, and has received 3 units pRBCs thus far.   Assessment & Plan:   Principal Problem:   Decompensation of cirrhosis of liver (HCC) Active Problems:   Alcoholic cirrhosis of liver with ascites (HCC)   Anasarca   Thrombocytopenia (HCC)   Coagulopathy (HCC)   Anemia, unspecified   Alcohol abuse   DOE (dyspnea on exertion)   Peripheral edema   Encephalopathy, hepatic (HCC)   Acute renal failure (HCC)   Decompensation of alcoholic cirrhosis of liver. Patient was restarted on nadolol and rifaximin. GI was consulted and continued to follow the patient and make management recommendations. Due to massive volume overload and a low albumin of 2 or less, he was started on IV Lasix and albumin infusions. He was continued on Aldactone. His fluid intake was restricted. He was given a low sodium diet. Eventually metolazone was added. -Per GI, his AFP was normal. Ultrasound of his abdomen with Doppler on 8/16 revealed evidence of known cirrhosis, patent portal vein with normal hepatopedal flow; mild cholelithiasis but no sonographic evidence to suggest  cholecystitis. He was started on albumin infusions to help with efficacy of lasix. Continue aldactone. Will restrict fluid intake and continue to monitor input and output. Continue low sodium diet. Continue IV lasix and metolozone. Creatinine is trending up, but will likely need to tolerate a higher creatinine in order to achieve adequate diuresis -Per GI, prednisolone was started and continued for a few days before it was discontinued.  Acute renal failure. Patient's creatinine was 1.03 on admission. It creatinine started trending up with diuresis.? Element of hepatorenal syndrome. Nephrology was consulted to assist with diuresis. -Dr. Lowanda Foster increased the IV Lasix to 160 mg IV twice a day; increase Aldactone to 50 mg daily. -There has been a slight improvement in his creatinine.  Hepatic encephalopathy. Patient's ammonia level was noted to be elevated. He was started on lactulose. He was continued on rifaximin. -We'll order a follow-up ammonia level.  Alcohol abuse.  Has been alcohol free for 3 weeks. No signs of alcohol withdrawal at this time. Continue thiamine.   Anemia, likely multifactorial. Patient has been transfused multiple units of packed red blood cells during her previous hospitalizations. His hemoglobin was 7.3 on admission and appears to range from 6-8 g. Anemia panel indicated iron deficiency. He was transfused 3 units  pRBC with improvement of hemoglobin by only 1 gram. Patient did have significant and continues to have some oozing from his right IJ central line site. Query if there is a hemolytic process. - Hematology consulted. Their assessment noted and appreciated. The patient was given 1 dose of Feraheme on 01/10/16. Per hematology,  it can be repeated in 3-8 days if necessary. There may be an element of hemolysis minimal to mild given his mildly elevated LDH and mildly low haptoglobin and elevated indirect bilirubin. DAT was negative. G6PD is pending. Pathology smear is  pending. -GI noted that the patient had prior overt GI bleeding during a previous hospitalization, likely from his lower GI tract in the setting of known coagulopathy. He is not a candidate for colonoscopy at this time.  Severe coagulopathy, secondary to cirrhosis. Patient's baseline INR is 2-2.46. It was 2.10 on admission. Subcutaneous vitamin K was ordered 3 days. -His INR is with little improvement.  Thrombocytopenia.  Patient's baseline platelet count ranges from 93-105. It was 103 on admission. It has remained at baseline. 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 We'll continue to monitor.  Possible DVT in RUE.  Patient's RUE Mid line infiltrated resulting in swelling. Venous dopplers could not rule out DVT. Regardless, with his significant anemia and underlying coagulopathy with INR of 2, he is not a candidate for anticoagulation. Hematology/oncology agreed.  Hypokalemia. With IV Lasix, the patient's serum potassium decreased. Oral potassium chloride supplementation started. We'll continue to monitor.  Morbid obesity GERD. Continue on PPI.  Discussion: Patient's cirrhosis appears to be end-stage. GI, Dr. Oneida Alar had recommended palliative care and hospice to the patient during a previous hospitalization. Oncology also recommends a palliative care consult. Previous hospitalist, Dr. Roderic Palau had a long discussion with the patient and his wife and they were in agreement to a palliative care consult if he continued to decline. When I approach the patient again today about palliative care consult, he was not receptive to it.   DVT prophylaxis: SCDs Code Status: Full Family Communication: Family not available Disposition Plan: Discharge once improved.   Consultants:   Gastroenterology  Procedures:  1 unit pRBC transfusion 8/12  2 units pRBC transfusion 8/13  Mid line placed 8/12; right IJ central line.  ECHO Study Conclusions - Left ventricle: The cavity size was  normal. Wall thickness was   increased in a pattern of moderate LVH. Systolic function was   normal. The estimated ejection fraction was in the range of 60%   to 65%. Diastolic function is abnormal, indeterminate grade. Wall   motion was normal; there were no regional wall motion   abnormalities. - Aortic valve: Valve area (VTI): 3.9 cm^2. Valve area (Vmax): 3.74   cm^2. Valve area (Vmean): 4.11 cm^2. - Left atrium: The atrium was mildly dilated. - Technically adequate study.  Antimicrobials:  None   Subjective: Patient feels slightly improved today. He complains of persistent loose stools on lactulose.   Objective: Vitals:   01/11/16 2131 01/12/16 0617 01/12/16 0643 01/12/16 1324  BP: 113/65 (!) 120/59  (!) 118/58  Pulse: 71 70  71  Resp: 18 18  18   Temp: 98.2 F (36.8 C) 98.5 F (36.9 C)  98.4 F (36.9 C)  TempSrc: Oral Oral    SpO2: 100% 99%  100%  Weight:   (!) 183.5 kg (404 lb 8 oz)   Height:   6\' 2"  (1.88 m)     Intake/Output Summary (Last 24 hours) at 01/12/16 1806 Last data filed at 01/12/16 1331  Gross per 24 hour  Intake              780 ml  Output                0 ml  Net  780 ml   Filed Weights   01/10/16 0609 01/11/16 0500 01/12/16 0643  Weight: (!) 179.9 kg (396 lb 9.6 oz) (!) 185.1 kg (408 lb) (!) 183.5 kg (404 lb 8 oz)   Examination:  General exam: Appears calm and comfortable  Neck: Right IJ central line noted with blood-tinged dressing. Respiratory system: Clear to auscultation. Respiratory effort normal. Cardiovascular system: S1 & S2 heard, RRR. No JVD, murmurs, rubs, gallops or clicks. 3-4+ pitting lower extremity/ pedal edema with anasarca. 2-3+ LUE edema and trace to 1+ RUE extremity edema. Gastrointestinal system: Abdomen is distended +/- acites, soft and nontender. No organomegaly or masses felt. Normal bowel sounds heard. Central nervous system: Alert and less lethargic. Cranial nerves II through XII are intact. No  asterixis. Extremities: Symmetric 5 x 5 power. Skin: Brawny edema of his upper extremities and lower extremities; tattoo on upper extremities. Psychiatry: Alert and oriented 3. Flat affect.   Data Reviewed: I have personally reviewed following labs and imaging studies  CBC:  Recent Labs Lab 01/07/16 0643 01/08/16 0945 01/09/16 0400 01/10/16 0629 01/11/16 0713  WBC 14.7* 12.5* 13.1* 11.7* 10.3  HGB 6.6* 6.2* 7.4* 7.3* 7.2*  HCT 21.1* 20.0* 23.1* 22.7* 21.8*  MCV 95.0 93.0 90.9 91.2 92.0  PLT 109* 104* 104* 108* 90*   Basic Metabolic Panel:  Recent Labs Lab 01/08/16 0945 01/09/16 0400 01/10/16 0629 01/11/16 0714 01/12/16 0629  NA 130* 130* 132* 133* 132*  K 3.6 3.6 4.2 3.5 3.1*  CL 101 101 100* 100* 99*  CO2 24 24 26 26 27   GLUCOSE 140* 92 80 114* 80  BUN 33* 38* 42* 48* 47*  CREATININE 1.41* 1.55* 1.62* 2.01* 1.70*  CALCIUM 7.4* 7.8* 8.5* 8.5* 8.9  PHOS  --   --   --  5.4*  --    GFR: Estimated Creatinine Clearance: 97.2 mL/min (by C-G formula based on SCr of 1.7 mg/dL). Liver Function Tests:  Recent Labs Lab 01/08/16 0945 01/09/16 0400 01/11/16 0714 01/12/16 0629  AST 70* 71*  --  69*  ALT 36 36  --  32  ALKPHOS 147* 142*  --  111  BILITOT 3.5* 4.5*  --  4.0*  PROT 6.0* 6.4*  --  6.7  ALBUMIN 2.0* 2.5* 2.8* 3.0*   Coagulation Profile:  Recent Labs Lab 01/08/16 1455 01/09/16 1519 01/11/16 0713  INR 2.18 1.96 2.15   Urine analysis:    Component Value Date/Time   COLORURINE AMBER (A) 12/27/2015 2229   APPEARANCEUR CLEAR 12/27/2015 2229   LABSPEC 1.010 12/27/2015 2229   PHURINE 6.0 12/27/2015 2229   GLUCOSEU NEGATIVE 12/27/2015 2229   HGBUR LARGE (A) 12/27/2015 2229   BILIRUBINUR MODERATE (A) 12/27/2015 2229   KETONESUR NEGATIVE 12/27/2015 2229   PROTEINUR TRACE (A) 12/27/2015 2229   NITRITE NEGATIVE 12/27/2015 2229   LEUKOCYTESUR NEGATIVE 12/27/2015 2229   Scheduled Meds: . sodium chloride   Intravenous Once  . sodium chloride    Intravenous Once  . folic acid  1 mg Oral Daily  . furosemide  160 mg Intravenous BID  . lactulose  20 g Oral TID  . nadolol  40 mg Oral BID  . pantoprazole  40 mg Oral BID AC  . phytonadione  2 mg Subcutaneous Daily  . potassium chloride  40 mEq Oral TID  . rifaximin  550 mg Oral BID  . sodium chloride flush  3 mL Intravenous Q12H  . spironolactone  50 mg Oral Daily  . thiamine  100 mg  Oral Daily   Continuous Infusions:    LOS: 8 days   Time spent: 30 minutes  Rexene Alberts, MD [facility]  If 7PM-7AM, please contact night-coverage [email or web address] Password [facility] 01/12/2016, 6:06 PM

## 2016-01-13 DIAGNOSIS — D696 Thrombocytopenia, unspecified: Secondary | ICD-10-CM

## 2016-01-13 DIAGNOSIS — F1010 Alcohol abuse, uncomplicated: Secondary | ICD-10-CM

## 2016-01-13 DIAGNOSIS — R0609 Other forms of dyspnea: Secondary | ICD-10-CM

## 2016-01-13 DIAGNOSIS — D649 Anemia, unspecified: Secondary | ICD-10-CM

## 2016-01-13 DIAGNOSIS — R600 Localized edema: Secondary | ICD-10-CM

## 2016-01-13 DIAGNOSIS — R0600 Dyspnea, unspecified: Secondary | ICD-10-CM

## 2016-01-13 DIAGNOSIS — K7031 Alcoholic cirrhosis of liver with ascites: Secondary | ICD-10-CM

## 2016-01-13 DIAGNOSIS — N179 Acute kidney failure, unspecified: Secondary | ICD-10-CM

## 2016-01-13 DIAGNOSIS — D689 Coagulation defect, unspecified: Secondary | ICD-10-CM

## 2016-01-13 LAB — HEPATIC FUNCTION PANEL
ALBUMIN: 3.1 g/dL — AB (ref 3.5–5.0)
ALT: 30 U/L (ref 17–63)
AST: 65 U/L — ABNORMAL HIGH (ref 15–41)
Alkaline Phosphatase: 106 U/L (ref 38–126)
BILIRUBIN DIRECT: 1.6 mg/dL — AB (ref 0.1–0.5)
BILIRUBIN INDIRECT: 2.4 mg/dL — AB (ref 0.3–0.9)
TOTAL PROTEIN: 6.6 g/dL (ref 6.5–8.1)
Total Bilirubin: 4 mg/dL — ABNORMAL HIGH (ref 0.3–1.2)

## 2016-01-13 LAB — CBC
HCT: 20.8 % — ABNORMAL LOW (ref 39.0–52.0)
HEMOGLOBIN: 6.7 g/dL — AB (ref 13.0–17.0)
MCH: 29.3 pg (ref 26.0–34.0)
MCHC: 31.9 g/dL (ref 30.0–36.0)
MCV: 91.7 fL (ref 78.0–100.0)
Platelets: 102 10*3/uL — ABNORMAL LOW (ref 150–400)
RBC: 2.29 MIL/uL — AB (ref 4.22–5.81)
RDW: 19.1 % — ABNORMAL HIGH (ref 11.5–15.5)
WBC: 10.5 10*3/uL (ref 4.0–10.5)

## 2016-01-13 LAB — RENAL FUNCTION PANEL
ALBUMIN: 3.1 g/dL — AB (ref 3.5–5.0)
ANION GAP: 10 (ref 5–15)
BUN: 48 mg/dL — AB (ref 6–20)
CALCIUM: 9.2 mg/dL (ref 8.9–10.3)
CHLORIDE: 101 mmol/L (ref 101–111)
CO2: 25 mmol/L (ref 22–32)
CREATININE: 1.64 mg/dL — AB (ref 0.61–1.24)
GFR calc Af Amer: 58 mL/min — ABNORMAL LOW (ref >60)
GFR, EST NON AFRICAN AMERICAN: 50 mL/min — AB (ref >60)
GLUCOSE: 85 mg/dL (ref 65–99)
POTASSIUM: 3.6 mmol/L (ref 3.5–5.1)
Phosphorus: 4.5 mg/dL (ref 2.5–4.6)
Sodium: 136 mmol/L (ref 135–145)

## 2016-01-13 LAB — PREPARE RBC (CROSSMATCH)

## 2016-01-13 LAB — PROTIME-INR
INR: 2.28
Prothrombin Time: 25.5 seconds — ABNORMAL HIGH (ref 11.4–15.2)

## 2016-01-13 LAB — AMMONIA: Ammonia: 72 umol/L — ABNORMAL HIGH (ref 9–35)

## 2016-01-13 MED ORDER — OXYCODONE HCL 5 MG PO TABS
5.0000 mg | ORAL_TABLET | ORAL | Status: DC | PRN
  Administered 2016-01-15 (×2): 5 mg via ORAL
  Filled 2016-01-13 (×2): qty 1

## 2016-01-13 MED ORDER — POTASSIUM CHLORIDE CRYS ER 20 MEQ PO TBCR
40.0000 meq | EXTENDED_RELEASE_TABLET | Freq: Two times a day (BID) | ORAL | Status: DC
  Administered 2016-01-13 – 2016-01-16 (×8): 40 meq via ORAL
  Filled 2016-01-13 (×8): qty 2

## 2016-01-13 MED ORDER — SODIUM CHLORIDE 0.9 % IV SOLN: Freq: Once | INTRAVENOUS | Status: DC

## 2016-01-13 NOTE — Progress Notes (Signed)
Subjective: States he feels better today. No abdominal pain, N/V, swelling better to him. No hematochezia, melena, or other active bleeding noted. No other upper or lower GI complaints.  Objective: Vital signs in last 24 hours: Temp:  [98.2 F (36.8 C)-98.6 F (37 C)] 98.2 F (36.8 C) (08/18 0617) Pulse Rate:  [65-72] 72 (08/18 0617) Resp:  [18-24] 18 (08/18 0617) BP: (114-123)/(58-60) 123/60 (08/18 0617) SpO2:  [99 %-100 %] 100 % (08/18 0617) Weight:  [398 lb 11.4 oz (180.9 kg)] 398 lb 11.4 oz (180.9 kg) (08/18 0617) Last BM Date: 01/10/16 General:   Morbidly obese, alert and oriented, pleasant. Head:  Normocephalic and atraumatic. Eyes:  No icterus, sclera clear. Conjuctiva pink.  Mouth:  Without lesions, mucosa pink and moist.  Neck:  Supple, without thyromegaly or masses.  Heart:  S1, S2 present, no murmurs noted.  Lungs: Clear to auscultation bilaterally, without wheezing, rales, or rhonchi.  Abdomen:  Bowel sounds present, soft, non-tender, non-distended. No HSM or hernias noted. No rebound or guarding. No masses appreciated  Msk:  Symmetrical without gross deformities. Normal posture. Pulses:  Normal pulses noted. Extremities:  Without clubbing or edema. Neurologic:  Alert and  oriented x4;  grossly normal neurologically. Skin:  Warm and dry, intact without significant lesions.  Cervical Nodes:  No significant cervical adenopathy. Psych:  Alert and cooperative. Normal mood and affect.  Intake/Output from previous day: 08/17 0701 - 08/18 0700 In: 1200 [P.O.:1200] Out: -  Intake/Output this shift: No intake/output data recorded.  Lab Results:  Recent Labs  01/11/16 0713 01/12/16 0629 01/13/16 0645  WBC 10.3 9.3 10.5  HGB 7.2* 6.9* 6.7*  HCT 21.8* 22.3* 20.8*  PLT 90* 98* 102*   BMET  Recent Labs  01/11/16 0714 01/12/16 0629 01/13/16 0645  NA 133* 132* 136  K 3.5 3.1* 3.6  CL 100* 99* 101  CO2 26 27 25   GLUCOSE 114* 80 85  BUN 48* 47* 48*   CREATININE 2.01* 1.70* 1.64*  CALCIUM 8.5* 8.9 9.2   LFT  Recent Labs  01/11/16 0714 01/12/16 0629 01/13/16 0645  PROT  --  6.7 6.6  ALBUMIN 2.8* 3.0* 3.1*  3.1*  AST  --  69* 65*  ALT  --  32 30  ALKPHOS  --  111 106  BILITOT  --  4.0* 4.0*  BILIDIR  --  1.5* 1.6*  IBILI  --  2.5* 2.4*   PT/INR  Recent Labs  01/11/16 0713 01/13/16 0645  LABPROT 24.4* 25.5*  INR 2.15 2.28   Hepatitis Panel No results for input(s): HEPBSAG, HCVAB, HEPAIGM, HEPBIGM in the last 72 hours.   Studies/Results: No results found.  Assessment: 45 year old male with history of ETOH cirrhosis, ETOH hepatitis, decompensated liver disease, recently admitted 7/31-8/5 with likely lower GI bleed in the setting of coagulopathy, undergoing EGD during prior admission without stigmata of variceal bleed. Readmitted on 8/8 with anasarca, shortness of breath, worsening anemia. Clinically without signs of overt GI bleeding at this time. Prednisolone therapy for acute ETOH hepatitis from last admission has been discontinued, as he has failed to respond and fluid overload worsened.  He seems to have some denial: today stating he was told he may have to go to a bigger medical center but "I think we should wait and see what we're working with. I know my liver isn't great, but it may be able to get better."  Principal Problem: Decompensation of cirrhosis of liver [facility]): Decompensated liver disease, U/S with  doppler done 8/15 shows patent portal vein with normal hepatopetal flow, evidence of cirrhosis. Cr improved with nephrology assistance, albumin somewhat improved with IV albumin runs (completed). Weight today with a 6 pound decrease from yesterday (398 versus 404 lb.). Recalculation of MELD below. Continued 3+ pitting edema to the lower thigh, some fluid weeping of his right arm and possibly abdomen is noted. Overall prognosis is poor from a liver standpoint, especially if he continues to drink at home. Minimal  improvement in MELD despite IV albumin and optimization of Cr and diuresis.  MELD 8/14: 28 MELD today: 26 (Child-Pugh C) (20% 71-month mortality)   Anemia, unspecified: Continued anemia without overt bleeding due to coagulopathy in the setting of end-stage liver disease possibly chronic disease, chronic blood loss. Has received 3 units PRBC with only 1 gm rise in hgb. Low iron, normal ferritin. Hgb today 6.7 (down from 6.9 and 7.2 last two days). Discussed with Dr. Caryn Section who will transfuse another 2 units with frank discussion that he needs to have palliative care/hospice consult.    Plan: 1. Continue diuresis, appreciate nephrology consult 2. Minimal improvement in MELD score despite improvement in albumin with IV albumin runs and diuresis.  3. Supportive measures    Thank you for allowing [place] to participate in the care of [place], DNP, AGNP-C Adult & Gerontological Nurse Practitioner [facility]     LOS: 9 days    01/13/2016, 8:42 AM

## 2016-01-13 NOTE — Plan of Care (Signed)
Problem: Pain Managment: Goal: General experience of comfort will improve Outcome: Progressing Pt rates his pain at a 6. Given pain medicine and upon reassessment pt was asleep.  Problem: Physical Regulation: Goal: Ability to maintain clinical measurements within normal limits will improve Outcome: Progressing See doc flowsheet. Pt's vitals remain stable.   Problem: Tissue Perfusion: Goal: Risk factors for ineffective tissue perfusion will decrease Outcome: Progressing Pt wearing SCDs.

## 2016-01-13 NOTE — Progress Notes (Signed)
Pt upset about fluid restriction and has been asking other staff members to bring him fluids, ice, and popsicles. Educated pt on fluid restrictions and purpose of medications such as Lasix in relation to his disease processes. Pt verbalized understanding but is still requesting fluids.

## 2016-01-13 NOTE — Progress Notes (Signed)
PROGRESS NOTE    Antonio [name]  [id number] DOB: 1971/05/19 DOA: 01/03/2016 PCP: Deloria Lair, MD  Outpatient Specialists: None  Brief Narrative: 22 yom with hx of alcoholic cirrhosis of liver, alcohol abuse, GERD, morbid obesity, and anemia of chronic disease, was recently discharged a few days ago for a GI bleed after receiving 2 units of PRBCs. He presents today with complaints of progressively worsened swelling in his legs and SOB. While in the ED, he was noted to have volume overload. Pt was readmitted for further management of his swelling and SOB. EKG showed sinus or ectopic atrial rhythm. ECHO showed EF of 60-65%. He was noted to have decompensation of cirrhosis of the liver. Started on IV lasix and albumin to aid diuresis. Diuresis with lasix has been unimpressive thus far, so  metolozone was added. He has been noted to be anemic, and has received 3 units pRBCs thus far.   Assessment & Plan:   Principal Problem:   Decompensation of cirrhosis of liver (HCC) Active Problems:   Alcoholic cirrhosis of liver with ascites (HCC)   Anasarca   Thrombocytopenia (HCC)   Coagulopathy (HCC)   Anemia, unspecified   Alcohol abuse   DOE (dyspnea on exertion)   Peripheral edema   Encephalopathy, hepatic (HCC)   Acute renal failure (HCC)   Bilateral edema of lower extremity   Decompensation of alcoholic cirrhosis of liver. Patient was restarted on nadolol and rifaximin. GI was consulted and continued to follow the patient and make management recommendations. Due to massive volume overload and a low albumin of 2 or less, he was started on IV Lasix and albumin infusions. He was continued on Aldactone. His fluid intake was restricted. He was given a low sodium diet. Eventually metolazone was added. -Per GI, his AFP was normal. Ultrasound of his abdomen with Doppler on 8/16 revealed evidence of known cirrhosis, patent portal vein with normal hepatopedal flow; mild cholelithiasis but no sonographic  evidence to suggest cholecystitis. He was started on albumin infusions to help with efficacy of lasix. Continue aldactone. Will restrict fluid intake and continue to monitor input and output. Continue low sodium diet. Continue IV lasix and metolozone. Creatinine is trending up, but will likely need to tolerate a higher creatinine in order to achieve adequate diuresis -Per GI, prednisolone was started and continued for a few days before it was discontinued. -As of 8/18, his MELD (20%-3 month mortality).   Acute renal failure. Patient's creatinine was 1.03 on admission. It  started trending up with diuresis to a high of 2.0.? Element of hepatorenal syndrome. Nephrology was consulted to assist with diuresis. -Dr. Lowanda Foster increased the IV Lasix to 160 mg IV twice a day; increased Aldactone to 50 mg daily. -There continues to be improvement in his creatinine.  Hepatic encephalopathy. Patient's ammonia level was noted to be elevated. He was started on lactulose. He was continued on rifaximin. -Follow-up ammonia level improved from 140-72.  Alcohol abuse.  Has been alcohol free for 3 weeks. No signs of alcohol withdrawal at this time. Continue thiamine.   Anemia, likely multifactorial. Patient has been transfused multiple units of packed red blood cells during her previous hospitalizations. His hemoglobin was 7.3 on admission and appears to range from 6-8 g. Anemia panel indicated iron deficiency. He was transfused 3 units  pRBC with improvement of hemoglobin by only 1 gram. Patient did have significant and continues to have some oozing from his right IJ central line site. Query if there is a hemolytic process. -  Hematology consulted. Their assessment noted and appreciated. The patient was given 1 dose of Feraheme on 01/10/16. Per hematology, it can be repeated in 3-8 days if necessary. There may be an element of hemolysis minimal to mild given his mildly elevated LDH and mildly low haptoglobin and  elevated indirect bilirubin. DAT was negative. G6PD slightly elevated-significance not clear. Pathology smear is pending. -GI noted that the patient had prior overt GI bleeding during a previous hospitalization, likely from his lower GI tract in the setting of known coagulopathy. He is not a candidate for colonoscopy at this time. -Given that his hemoglobin has trended down again to below 7, will transfuse another 2 units of packed red blood cells. I informed the patient that ongoing transfusions are doing little to improve his prognosis and I would favor not transfusing any longer, but this decision will need to be made with the medical team and with his wife involved in the decision-making. Again, the patient was encouraged to consider palliative care.  Severe coagulopathy, secondary to cirrhosis. Patient's baseline INR is 2-2.46. It was 2.10 on admission. Subcutaneous vitamin K was ordered 3 days. -His INR is with little improvement.  Thrombocytopenia.  Patient's baseline platelet count ranges from 93-105. It was 103 on admission. It has remained at baseline. 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 We'll continue to monitor.  Possible DVT in RUE.  Patient's RUE Mid line infiltrated resulting in swelling. Venous dopplers could not rule out DVT. Regardless, with his significant anemia and underlying coagulopathy with INR of 2, he is not a candidate for anticoagulation. Hematology/oncology agreed.  Hypokalemia. With IV Lasix, the patient's serum potassium decreased. Oral potassium chloride supplementation started. We'll continue to monitor.  Morbid obesity GERD. Continue on PPI.  Discussion: Patient's cirrhosis appears to be end-stage. GI, Dr. Oneida Alar had recommended palliative care and hospice to the patient during a previous hospitalization. Oncology also recommends a palliative care consult. Previous hospitalist, Dr. Roderic Palau had a long discussion with the patient and his wife and they  were in agreement to a palliative care consult if he continued to decline. When I approached the patient again today about palliative care consult, he was uncertain about it and would like to talk to his wife about it again. Gastroenterologist prognosis, MELD score noted.   DVT prophylaxis: SCDs Code Status: Full Family Communication: Family not available Disposition Plan: Discharge once improved.   Consultants:   Gastroenterology  Procedures:  2 units packed red blood cell transfusion on 8/18  1 unit pRBC transfusion 8/12  2 units pRBC transfusion 8/13  Mid line placed 8/12; right IJ central line.  ECHO Study Conclusions - Left ventricle: The cavity size was normal. Wall thickness was   increased in a pattern of moderate LVH. Systolic function was   normal. The estimated ejection fraction was in the range of 60%   to 65%. Diastolic function is abnormal, indeterminate grade. Wall   motion was normal; there were no regional wall motion   abnormalities. - Aortic valve: Valve area (VTI): 3.9 cm^2. Valve area (Vmax): 3.74   cm^2. Valve area (Vmean): 4.11 cm^2. - Left atrium: The atrium was mildly dilated. - Technically adequate study.  Antimicrobials:  None   Subjective: Patient has no new complaints. He still acknowledges frequent loose stools, numbering 3-4 daily.   Objective: Vitals:   01/13/16 0617 01/13/16 1512 01/13/16 1639 01/13/16 1700  BP: 123/60 (!) 113/56 (!) 111/54 (!) 117/54  Pulse: 72 67 62 64  Resp: 18 20  20 20  Temp: 98.2 F (36.8 C) 98.5 F (36.9 C) 98.5 F (36.9 C) 98.4 F (36.9 C)  TempSrc: Oral Oral Oral Oral  SpO2: 100% 100%    Weight: (!) 180.9 kg (398 lb 11.4 oz)     Height:        Intake/Output Summary (Last 24 hours) at 01/13/16 1751 Last data filed at 01/13/16 1645  Gross per 24 hour  Intake             1310 ml  Output                0 ml  Net             1310 ml   Filed Weights   01/11/16 0500 01/12/16 0643 01/13/16 0617    Weight: (!) 185.1 kg (408 lb) (!) 183.5 kg (404 lb 8 oz) (!) 180.9 kg (398 lb 11.4 oz)   Examination:  General exam: Appears calm and comfortable  Neck: Right IJ central line noted with blood-tinged dressing. Respiratory system: Clear to auscultation. Respiratory effort normal. Cardiovascular system: S1 & S2 heard, RRR. No JVD, murmurs, rubs, gallops or clicks. 3-4+ pitting lower extremity/ pedal edema with anasarca. 2-3+ LUE edema and trace to 1+ RUE extremity edema. Gastrointestinal system: Abdomen is distended +/- acites, soft and nontender. No organomegaly or masses felt. Normal bowel sounds heard. Central nervous system: Alert and less lethargic. Cranial nerves II through XII are intact. No asterixis. Extremities: Symmetric 5 x 5 power. Skin: Brawny edema of his upper extremities and lower extremities; tattoo on upper extremities. Psychiatry: Alert and oriented 3. Flat affect.   Data Reviewed: I have personally reviewed following labs and imaging studies  CBC:  Recent Labs Lab 01/09/16 0400 01/10/16 0629 01/11/16 0713 01/12/16 0629 01/13/16 0645  WBC 13.1* 11.7* 10.3 9.3 10.5  HGB 7.4* 7.3* 7.2* 6.9* 6.7*  HCT 23.1* 22.7* 21.8* 22.3* 20.8*  MCV 90.9 91.2 92.0 94.9 91.7  PLT 104* 108* 90* 98* [id number]*   Basic Metabolic Panel:  Recent Labs Lab 01/09/16 0400 01/10/16 0629 01/11/16 0714 01/12/16 0629 01/13/16 0645  NA 130* 132* 133* 132* 136  K 3.6 4.2 3.5 3.1* 3.6  CL 101 100* 100* 99* 101  CO2 24 26 26 27 25   GLUCOSE 92 80 114* 80 85  BUN 38* 42* 48* 47* 48*  CREATININE 1.55* 1.62* 2.01* 1.70* 1.64*  CALCIUM 7.8* 8.5* 8.5* 8.9 9.2  PHOS  --   --  5.4*  --  4.5   GFR: Estimated Creatinine Clearance: 100 mL/min (by C-G formula based on SCr of 1.64 mg/dL). Liver Function Tests:  Recent Labs Lab 01/08/16 0945 01/09/16 0400 01/11/16 0714 01/12/16 0629 01/13/16 0645  AST 70* 71*  --  69* 65*  ALT 36 36  --  32 30  ALKPHOS 147* 142*  --  111 106  BILITOT 3.5*  4.5*  --  4.0* 4.0*  PROT 6.0* 6.4*  --  6.7 6.6  ALBUMIN 2.0* 2.5* 2.8* 3.0* 3.1*  3.1*   Coagulation Profile:  Recent Labs Lab 01/08/16 1455 01/09/16 1519 01/11/16 0713 01/13/16 0645  INR 2.18 1.96 2.15 2.28   Urine analysis:    Component Value Date/Time   COLORURINE AMBER (A) 12/27/2015 2229   APPEARANCEUR CLEAR 12/27/2015 2229   LABSPEC 1.010 12/27/2015 2229   PHURINE 6.0 12/27/2015 2229   GLUCOSEU NEGATIVE 12/27/2015 2229   HGBUR LARGE (A) 12/27/2015 2229   BILIRUBINUR MODERATE (A) 12/27/2015 2229  KETONESUR NEGATIVE 12/27/2015 2229   PROTEINUR TRACE (A) 12/27/2015 2229   NITRITE NEGATIVE 12/27/2015 2229   LEUKOCYTESUR NEGATIVE 12/27/2015 2229   Scheduled Meds: . sodium chloride   Intravenous Once  . sodium chloride   Intravenous Once  . sodium chloride   Intravenous Once  . folic acid  1 mg Oral Daily  . furosemide  160 mg Intravenous BID  . lactulose  20 g Oral TID  . nadolol  40 mg Oral BID  . pantoprazole  40 mg Oral BID AC  . potassium chloride  40 mEq Oral BID  . rifaximin  550 mg Oral BID  . sodium chloride flush  3 mL Intravenous Q12H  . spironolactone  50 mg Oral Daily  . thiamine  100 mg Oral Daily   Continuous Infusions:    LOS: 9 days   Time spent: 30 minutes  Rexene Alberts, MD [facility]  If 7PM-7AM, please contact night-coverage [email or web address] Password [facility] 01/13/2016, 5:51 PM

## 2016-01-13 NOTE — Progress Notes (Signed)
Subjective: Interval History: Patient offers no complaints. He denies any difficulty breathing. Patient states that he is making more urine.  Objective: Vital signs in last 24 hours: Temp:  [98.2 F (36.8 C)-98.6 F (37 C)] 98.2 F (36.8 C) (08/18 0617) Pulse Rate:  [65-72] 72 (08/18 0617) Resp:  [18-24] 18 (08/18 0617) BP: (114-123)/(58-60) 123/60 (08/18 0617) SpO2:  [99 %-100 %] 100 % (08/18 0617) Weight:  [180.9 kg (398 lb 11.4 oz)] 180.9 kg (398 lb 11.4 oz) (08/18 0617) Weight change: -2.626 kg (-5 lb 12.6 oz)  Intake/Output from previous day: 08/17 0701 - 08/18 0700 In: 1200 [P.O.:1200] Out: -  Intake/Output this shift: No intake/output data recorded.  General appearance: alert, cooperative and no distress Resp: diminished breath sounds bilaterally Cardio: regular rate and rhythm, S1, S2 normal, no murmur, click, rub or gallop Extremities: edema He has 3+ edema bilaterally  Lab Results:  Recent Labs  01/12/16 0629 01/13/16 0645  WBC 9.3 10.5  HGB 6.9* 6.7*  HCT 22.3* 20.8*  PLT 98* 102*   BMET:   Recent Labs  01/12/16 0629 01/13/16 0645  NA 132* 136  K 3.1* 3.6  CL 99* 101  CO2 27 25  GLUCOSE 80 85  BUN 47* 48*  CREATININE 1.70* 1.64*  CALCIUM 8.9 9.2   No results for input(s): PTH in the last 72 hours. Iron Studies: No results for input(s): IRON, TIBC, TRANSFERRIN, FERRITIN in the last 72 hours.  Studies/Results: No results found.  I have reviewed the patient's current medications.  Assessment/Plan: Problem #1acute renal failure: Most likely prerenal versus ATN. His renal function at this moment is progressively improving. Problem #2 anasarca: Patient on albumin / Lasix/Aldactone. Presently his urine output is not documented. However patient has lost about 2.5 kilograms.. Still patient with significant amount of fluid overload. Problem #3 hypokalemia: Most likely secondary to diuretics. Patient on potassium supplementation and Aldactone. His  potassium has corrected. Problem #4 anemia: His hemoglobin is low but stable Problem #5 hyponatremia: Hypervolemic hyponatremia; his sodium is normal. Problem #6liver cirrhosis   plan: We'll continue with Lasix and Aldactone. We'll DC albumin           Patient advised to decrease his salt and fluid intake.           We'll decrease his  KCl  To 40 mEq by mouth 2 times a day           We'll check his renal panel in the morning  LOS: 9 days   Karla Pavone S 01/13/2016,8:30 AM

## 2016-01-13 NOTE — Progress Notes (Signed)
Nutrition Brief Note  Patient identified as having an extended length of stay  Wt Readings from Last 15 Encounters:  01/13/16 (!) 398 lb 11.4 oz (180.9 kg)  12/28/15 (!) 359 lb 2.1 oz (162.9 kg)    Body mass index is 51.19 kg/m. Patient meets criteria for Morbidly obese based on current BMI.   Pt's extended LOS is related to his decompensated cirrhosis and acute renal failure.   Pt's intake records show good PO intake. He is eating >50% of meals. Today, pt confirms he is eating well.   His #1 complaint is his tight fluid restriction. He states his mouth is extremely dry and is upset he cant even have ice.   RD reinforced need to follow restriction as it was noted his renal function was improving and he was diuresing better.   No nutrition interventions warranted at this time. If nutrition issues arise, please consult RD.   Burtis Junes RD, LDN, CNSC Clinical Nutrition Pager: [phone] 01/13/2016 12:17 PM

## 2016-01-14 LAB — CBC
HCT: 24.6 % — ABNORMAL LOW (ref 39.0–52.0)
Hemoglobin: 8 g/dL — ABNORMAL LOW (ref 13.0–17.0)
MCH: 30.1 pg (ref 26.0–34.0)
MCHC: 32.5 g/dL (ref 30.0–36.0)
MCV: 92.5 fL (ref 78.0–100.0)
Platelets: 100 K/uL — ABNORMAL LOW (ref 150–400)
RBC: 2.66 MIL/uL — ABNORMAL LOW (ref 4.22–5.81)
RDW: 18.7 % — ABNORMAL HIGH (ref 11.5–15.5)
WBC: 11.7 K/uL — ABNORMAL HIGH (ref 4.0–10.5)

## 2016-01-14 LAB — RENAL FUNCTION PANEL
Albumin: 2.9 g/dL — ABNORMAL LOW (ref 3.5–5.0)
Anion gap: 8 (ref 5–15)
BUN: 47 mg/dL — ABNORMAL HIGH (ref 6–20)
CO2: 27 mmol/L (ref 22–32)
Calcium: 8.4 mg/dL — ABNORMAL LOW (ref 8.9–10.3)
Chloride: 100 mmol/L — ABNORMAL LOW (ref 101–111)
Creatinine, Ser: 1.66 mg/dL — ABNORMAL HIGH (ref 0.61–1.24)
GFR calc Af Amer: 57 mL/min — ABNORMAL LOW
GFR calc non Af Amer: 49 mL/min — ABNORMAL LOW
Glucose, Bld: 81 mg/dL (ref 65–99)
Phosphorus: 4.8 mg/dL — ABNORMAL HIGH (ref 2.5–4.6)
Potassium: 3.8 mmol/L (ref 3.5–5.1)
Sodium: 135 mmol/L (ref 135–145)

## 2016-01-14 LAB — TYPE AND SCREEN
ABO/RH(D): O NEG
ANTIBODY SCREEN: NEGATIVE
UNIT DIVISION: 0
UNIT DIVISION: 0

## 2016-01-14 MED ORDER — RIFAXIMIN 550 MG PO TABS: 550.0000 mg | ORAL_TABLET | Freq: Once | ORAL | Status: AC

## 2016-01-14 NOTE — Progress Notes (Signed)
Subjective: Interval History: Patient continued to feel better. He denies any difficulty in breathing.  Objective: Vital signs in last 24 hours: Temp:  [97.4 F (36.3 C)-98.6 F (37 C)] 97.4 F (36.3 C) (08/19 0134) Pulse Rate:  [62-72] 67 (08/19 0134) Resp:  [18-20] 18 (08/19 0134) BP: (102-117)/(49-56) 102/49 (08/19 0134) SpO2:  [100 %] 100 % (08/19 0134) Weight:  [176.5 kg (389 lb 1.8 oz)] 176.5 kg (389 lb 1.8 oz) (08/19 0500) Weight change: -4.354 kg (-9 lb 9.6 oz)  Intake/Output from previous day: 08/18 0701 - 08/19 0700 In: 1285 [P.O.:600; Blood:685] Out: 550 [Urine:550] Intake/Output this shift: Total I/O In: -  Out: 700 [Urine:700]  General appearance: alert, cooperative and no distress Resp: diminished breath sounds bilaterally Cardio: regular rate and rhythm, S1, S2 normal, no murmur, click, rub or gallop Extremities: edema He has 3+ edema bilaterally  Lab Results:  Recent Labs  01/13/16 0645 01/14/16 0557  WBC 10.5 11.7*  HGB 6.7* 8.0*  HCT 20.8* 24.6*  PLT 102* PENDING   BMET:   Recent Labs  01/13/16 0645 01/14/16 0557  NA 136 135  K 3.6 3.8  CL 101 100*  CO2 25 27  GLUCOSE 85 81  BUN 48* 47*  CREATININE 1.64* 1.66*  CALCIUM 9.2 8.4*   No results for input(s): PTH in the last 72 hours. Iron Studies: No results for input(s): IRON, TIBC, TRANSFERRIN, FERRITIN in the last 72 hours.  Studies/Results: No results found.  I have reviewed the patient's current medications.  Assessment/Plan: Problem #1acute renal failure: Most likely prerenal versus ATN. His renal function Is stable and he doesn't have any uremic signs and symptoms. Problem #2 anasarca: Patient on albumin / Lasix/Aldactone. Continue to have problem of documenting his urine output. Patient however has lost about 4 kg since yesterday. Hence his urine output seems to be reasonable. No Lamont documented as 500. Problem #3 hypokalemia: Most likely secondary to diuretics. Patient on  potassium supplementation and Aldactone. His potassium continued to improve. Problem #4 anemia: Patient has received blood transfusion and his hemoglobin is much better. Problem #5 hyponatremia: Hypervolemic hyponatremia; his sodium is normal. Problem #6liver cirrhosis   plan: 1] We'll continue with IV Lasix and Aldactone for today and possibly start on Demadex orally tomorrow.           2] Patient advised to decrease his salt and fluid intake.           3] We'll decrease his  KCl  To 40 mEq by mouth once a day.           4] discussed with nursing staff to document his urine output accurately           5] We'll check his renal panel in the morning  LOS: 10 days   Leshonda Galambos S 01/14/2016,8:52 AM

## 2016-01-14 NOTE — Progress Notes (Signed)
PROGRESS NOTE    Antonio [name]  [id number] DOB: 1971-01-21 DOA: 01/03/2016 PCP: Deloria Lair, MD  Outpatient Specialists: None  Brief Narrative: 8 yom with hx of alcoholic cirrhosis of liver, alcohol abuse, GERD, morbid obesity, and anemia of chronic disease, was recently discharged a few days ago for a GI bleed after receiving 2 units of PRBCs. He presents today with complaints of progressively worsened swelling in his legs and SOB. While in the ED, he was noted to have volume overload. Pt was readmitted for further management of his swelling and SOB. EKG showed sinus or ectopic atrial rhythm. ECHO showed EF of 60-65%. He was noted to have decompensation of cirrhosis of the liver. Started on IV lasix and albumin to aid diuresis. Diuresis with lasix has been unimpressive thus far, so  metolozone was added. He has been noted to be anemic, and has received 3 units pRBCs thus far.   Assessment & Plan:   Principal Problem:   Decompensation of cirrhosis of liver (HCC) Active Problems:   Alcoholic cirrhosis of liver with ascites (HCC)   Anasarca   Thrombocytopenia (HCC)   Coagulopathy (HCC)   Anemia, unspecified   Alcohol abuse   DOE (dyspnea on exertion)   Peripheral edema   Encephalopathy, hepatic (HCC)   Acute renal failure (HCC)   Bilateral edema of lower extremity   Decompensation of alcoholic cirrhosis of liver. Patient was restarted on nadolol and rifaximin. GI was consulted and continued to follow the patient and make management recommendations. Due to massive volume overload and a low albumin of 2 or less, he was started on IV Lasix and albumin infusions. He was continued on Aldactone. His fluid intake was restricted. He was given a low sodium diet. Eventually metolazone was added but was discontinued. -Per GI, his AFP was normal. Ultrasound of his abdomen with Doppler on 8/16 revealed evidence of known cirrhosis, patent portal vein with normal hepatopedal flow; mild  cholelithiasis but no sonographic evidence to suggest cholecystitis. -Per GI, prednisolone was started and continued for a few days before it was discontinued. -As of 8/18, his MELD (20%-3 month mortality).  -Per discussion with Dr. Oneida Alar, his life expectancy is less than 6 months and he is not a transplant candidate because he has been drinking alcohol up until 3 weeks ago. She discussed DNR status and home health hospice with the patient and family. They have not made the decision yet. Palliative care consult will be ordered.  Acute renal failure. Patient's creatinine was 1.03 on admission. It  started trending up with diuresis to a high of 2.0.? Element of hepatorenal syndrome. Nephrology was consulted to assist with diuresis. -Dr. Lowanda Foster increased the IV Lasix to 160 mg IV twice a day; increased Aldactone to 50 mg daily. -There continues to be improvement in his creatinine.  Hepatic encephalopathy. Patient's ammonia level was noted to be elevated. He was started on lactulose. He was continued on rifaximin. -Follow-up ammonia level improved from 140-72.  Alcohol abuse.  Has been alcohol free for 3 weeks. No signs of alcohol withdrawal at this time. Continue thiamine.   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 His hemoglobin was 7.3 on admission and appears to range from 6-8 g. Anemia panel indicated iron deficiency. He was transfused 3 units pRBC early in the hospital course with improvement in his Hb by only 1 gram. Hemolytic process  was a concern. - Hematology consulted. Their assessment noted and appreciated. The patient was given 1 dose of Feraheme on 01/10/16. Per hematology, it can be repeated in 3-8 days if necessary. There may be an element of hemolysis minimal to mild given his mildly elevated LDH and mildly low  haptoglobin and elevated indirect bilirubin. DAT was negative. G6PD slightly elevated-significance not clear. Pathology smear ordered. -GI noted that the patient has no overt GI bleeding but he likely has some lower GI tract oozing/chronic blood loss in the setting of known coagulopathy. He is not a candidate for colonoscopy at this time. -Patient also has intermittent oozing from his central line. - His Hb trended down again to 6.7. He was transfused another 2 units for total of 5 units at all. His hemoglobin improved to 8.0. I informed the patient that ongoing transfusions are doing little to improve his prognosis and I would favor not transfusing any longer. This was discussed with Dr. Oneida Alar who was in agreement.  Severe coagulopathy, secondary to cirrhosis. Patient's baseline INR is 2-2.46. It was 2.10 on admission. Subcutaneous vitamin K was given 3 days. -His INR has not improved and remains above 2.  Thrombocytopenia.  Patient's baseline platelet count ranges from 93-105. It was 103 on admission. It has remained at baseline. 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 We'll continue to monitor.  Possible DVT in RUE.  Patient's RUE Mid line infiltrated resulting in swelling. Venous dopplers could not rule out DVT. Regardless, with his significant anemia and underlying coagulopathy with INR of 2, he is not a candidate for anticoagulation. Hematology/oncology agreed.  Hypokalemia. With IV Lasix, the patient's serum potassium decreased. Oral potassium chloride supplementation started. We'll continue to monitor.  Morbid obesity GERD. Continue on PPI.  Discussion: Patient's cirrhosis appears to be end-stage. GI, Dr. Oneida Alar had recommended palliative care and hospice to the patient during a previous hospitalization. Oncology also recommends a palliative care consult. Previous hospitalist, Dr. Roderic Palau had a long discussion with the patient and his wife and they were in agreement to a  palliative care consult if he continued to decline. On 01/13/16 I approached the patient and his wife for a discussion about their understanding of his prognosis and what they understood the other doctors were telling them about his liver. The patient did not acknowledge that he was told that his prognosis was poor and that palliative care and subsequently hospice was recommended. His wife did acknowledge that she was told that his liver was such that there was no cure other than a liver transplant. However neither the patient nor his wife acknowledged being told that palliative care and hospice were recommended due to his poor prognosis.  -I discussed the patient with gastroenterologist Dr. Oneida Alar. She had a discussion again with the patient, his wife, and other family members on 01/14/16. Her insight, recommendations are noted and greatly appreciated. In essence, she recommended DNR status and hospice and informed them that his life expectancy was less than 6 months.  -We'll ask for a palliative care consult as they appear to be more receptive now.   DVT prophylaxis: SCDs Code Status: Full Family Communication: Family not available Disposition Plan: Discharge once improved.   Consultants:   Gastroenterology  Procedures:  2 units packed red blood cell transfusion on 8/18  1 unit pRBC transfusion 8/12  2 units pRBC transfusion 8/13  Mid line placed 8/12-d/c'd;  right IJ central line.  ECHO Study Conclusions - Left ventricle: The cavity size was normal.  Wall thickness was   increased in a pattern of moderate LVH. Systolic function was   normal. The estimated ejection fraction was in the range of 60%   to 65%. Diastolic function is abnormal, indeterminate grade. Wall   motion was normal; there were no regional wall motion   abnormalities. - Aortic valve: Valve area (VTI): 3.9 cm^2. Valve area (Vmax): 3.74   cm^2. Valve area (Vmean): 4.11 cm^2. - Left atrium: The atrium was mildly  dilated. - Technically adequate study.  Antimicrobials:  None   Subjective: Patient denies chest pain and shortness of breath. Family is in the room.  Objective: Vitals:   01/14/16 0134 01/14/16 0500 01/14/16 1005 01/14/16 1400  BP: (!) 102/49  121/60 116/62  Pulse: 67  72 61  Resp: 18   20  Temp: 97.4 F (36.3 C)   98.4 F (36.9 C)  TempSrc: Oral   Oral  SpO2: 100%   100%  Weight:  (!) 176.5 kg (389 lb 1.8 oz)    Height:        Intake/Output Summary (Last 24 hours) at 01/14/16 1711 Last data filed at 01/14/16 1234  Gross per 24 hour  Intake              575 ml  Output             1950 ml  Net            -1375 ml   Filed Weights   01/12/16 0643 01/13/16 0617 01/14/16 0500  Weight: (!) 183.5 kg (404 lb 8 oz) (!) 180.9 kg (398 lb 11.4 oz) (!) 176.5 kg (389 lb 1.8 oz)   Examination:  General exam: Appears calm and comfortable.   Neck: Right IJ central line noted with dressing-not blood tinged today. Respiratory system: Clear to auscultation. Respiratory effort normal. Cardiovascular system: S1 & S2 heard, RRR. No JVD, murmurs, rubs, gallops or clicks. 3-4+ pitting lower extremity/ pedal edema with anasarca. 2-3+ LUE edema and trace to 1+ RUE extremity edema. Gastrointestinal system: Abdomen is distended +/- acites, soft and nontender. No organomegaly or masses felt. Normal bowel sounds heard. Central nervous system: Alert and less lethargic. Cranial nerves II through XII are intact. No asterixis. Extremities: Symmetric 5 x 5 power. Skin: Brawny edema of his upper extremities and lower extremities; tattoo on upper extremities. Psychiatry: Alert and oriented 3. Flat affect.   Data Reviewed: I have personally reviewed following labs and imaging studies  CBC:  Recent Labs Lab 01/10/16 0629 01/11/16 0713 01/12/16 0629 01/13/16 0645 01/14/16 0557  WBC 11.7* 10.3 9.3 10.5 11.7*  HGB 7.3* 7.2* 6.9* 6.7* 8.0*  HCT 22.7* 21.8* 22.3* 20.8* 24.6*  MCV 91.2 92.0 94.9  91.7 92.5  PLT 108* 90* 98* 102* 123XX123*   Basic Metabolic Panel:  Recent Labs Lab 01/10/16 0629 01/11/16 0714 01/12/16 0629 01/13/16 0645 01/14/16 0557  NA 132* 133* 132* 136 135  K 4.2 3.5 3.1* 3.6 3.8  CL 100* 100* 99* 101 100*  CO2 26 26 27 25 27   GLUCOSE 80 114* 80 85 81  BUN 42* 48* 47* 48* 47*  CREATININE 1.62* 2.01* 1.70* 1.64* 1.66*  CALCIUM 8.5* 8.5* 8.9 9.2 8.4*  PHOS  --  5.4*  --  4.5 4.8*   GFR: Estimated Creatinine Clearance: 97.3 mL/min (by C-G formula based on SCr of 1.66 mg/dL). Liver Function Tests:  Recent Labs Lab 01/08/16 0945 01/09/16 0400 01/11/16 0714 01/12/16 0629 01/13/16 0645 01/14/16 0557  AST 70*  71*  --  69* 65*  --   ALT 36 36  --  32 30  --   ALKPHOS 147* 142*  --  111 106  --   BILITOT 3.5* 4.5*  --  4.0* 4.0*  --   PROT 6.0* 6.4*  --  6.7 6.6  --   ALBUMIN 2.0* 2.5* 2.8* 3.0* 3.1*  3.1* 2.9*   Coagulation Profile:  Recent Labs Lab 01/08/16 1455 01/09/16 1519 01/11/16 0713 01/13/16 0645  INR 2.18 1.96 2.15 2.28   Urine analysis:    Component Value Date/Time   COLORURINE AMBER (A) 12/27/2015 2229   APPEARANCEUR CLEAR 12/27/2015 2229   LABSPEC 1.010 12/27/2015 2229   PHURINE 6.0 12/27/2015 2229   GLUCOSEU NEGATIVE 12/27/2015 2229   HGBUR LARGE (A) 12/27/2015 2229   BILIRUBINUR MODERATE (A) 12/27/2015 2229   KETONESUR NEGATIVE 12/27/2015 2229   PROTEINUR TRACE (A) 12/27/2015 2229   NITRITE NEGATIVE 12/27/2015 2229   LEUKOCYTESUR NEGATIVE 12/27/2015 2229   Scheduled Meds: . sodium chloride   Intravenous Once  . sodium chloride   Intravenous Once  . sodium chloride   Intravenous Once  . folic acid  1 mg Oral Daily  . furosemide  160 mg Intravenous BID  . lactulose  20 g Oral TID  . nadolol  40 mg Oral BID  . pantoprazole  40 mg Oral BID AC  . potassium chloride  40 mEq Oral BID  . sodium chloride flush  3 mL Intravenous Q12H  . spironolactone  50 mg Oral Daily  . thiamine  100 mg Oral Daily   Continuous  Infusions:    LOS: 10 days   Time spent: 25 minutes  Rexene Alberts, MD [facility]  If 7PM-7AM, please contact night-coverage [email or web address] Password [email or web address] 01/14/2016, 5:11 PM

## 2016-01-14 NOTE — Progress Notes (Signed)
Patient ID: Antonio [name], male   DOB: August 30, 1970, 45 y.o.   MRN: [id number]  Assessment/Plan: ADMITTED WITH ANASARCA. PT HAD DECOMPENSATED LIVER DISEASE. SPOKE WITH FAMILY. AWARE PT HAS < 6 MO LIFE EXPECTANCY. EXPLAINED WHY HE NEED TO BE A DNR AND WHY HE SHOULD CONSIDER HOME HEALTH HOSPICE.  PLAN: 1. SUPPORTIVE CARE 2. AGREE WITH MOVING TOWARD PALLIATIVE CARE 3. NO ADDITINAL GI OPTIONS AVAILABLE TO IMPROVE LIFE EXPECTANCY. 4. BID PPI. 5. GENTLE DIURESIS OK. WOULD AVOID AGGRESSIVE DIURESIS DUE TO LOW ALBUMIN AND LIKELIHOOD AGGRESSIVE DIURESIS WILL PRECIPITATE RENAL FAILURE.  GREATER THAN 50% WAS SPENT IN COUNSELING & COORDINATION OF CARE WITH THE PATIENT: DISCUSSED DIFFERENTIAL DIAGNOSIS, PROCEDURE, BENEFITS, RISKS, AND MANAGEMENT OF END STAGE LIVER DISEASE. TOTAL ENCOUNTER TIME: 25 MINS.   Subjective: Since I last evaluated the patient HE HAS NO CONCERNS. STILL WONDERING ABOUT HIS PROGNOSIS.  Objective: Vital signs in last 24 hours: Vitals:   01/14/16 0134 01/14/16 1005  BP: (!) 102/49 121/60  Pulse: 67 72  Resp: 18   Temp: 97.4 F (36.3 C)      General appearance: alert, cooperative and no distress Resp: clear to auscultation bilaterally Cardio: regular rate and rhythm ABDOMINAL EXAM: SOFT NONTENDER  Lab Results: Hb 8.0 PLT CT 100 CR 1.66    Studies/Results: No results found.  Medications: I have reviewed the patient's current medications.   LOS: 5 days   [name] 11/05/2013, 2:23 PM

## 2016-01-15 ENCOUNTER — Inpatient Hospital Stay (HOSPITAL_COMMUNITY): Payer: BLUE CROSS/BLUE SHIELD

## 2016-01-15 LAB — CBC
HEMATOCRIT: 25.1 % — AB (ref 39.0–52.0)
HEMOGLOBIN: 8.2 g/dL — AB (ref 13.0–17.0)
MCH: 30 pg (ref 26.0–34.0)
MCHC: 32.7 g/dL (ref 30.0–36.0)
MCV: 91.9 fL (ref 78.0–100.0)
Platelets: 108 10*3/uL — ABNORMAL LOW (ref 150–400)
RBC: 2.73 MIL/uL — AB (ref 4.22–5.81)
RDW: 19.7 % — AB (ref 11.5–15.5)
WBC: 10.3 10*3/uL (ref 4.0–10.5)

## 2016-01-15 LAB — RENAL FUNCTION PANEL
ALBUMIN: 2.8 g/dL — AB (ref 3.5–5.0)
Anion gap: 8 (ref 5–15)
BUN: 45 mg/dL — AB (ref 6–20)
CHLORIDE: 101 mmol/L (ref 101–111)
CO2: 27 mmol/L (ref 22–32)
Calcium: 8.5 mg/dL — ABNORMAL LOW (ref 8.9–10.3)
Creatinine, Ser: 1.58 mg/dL — ABNORMAL HIGH (ref 0.61–1.24)
GFR, EST NON AFRICAN AMERICAN: 52 mL/min — AB (ref >60)
Glucose, Bld: 93 mg/dL (ref 65–99)
PHOSPHORUS: 5 mg/dL — AB (ref 2.5–4.6)
POTASSIUM: 3.7 mmol/L (ref 3.5–5.1)
Sodium: 136 mmol/L (ref 135–145)

## 2016-01-15 MED ORDER — TORSEMIDE 20 MG PO TABS
60.0000 mg | ORAL_TABLET | Freq: Every day | ORAL | Status: DC
  Administered 2016-01-15: 60 mg via ORAL
  Filled 2016-01-15: qty 3

## 2016-01-15 NOTE — Progress Notes (Addendum)
Patient ID: Antonio [name], male   DOB: 1971-04-12, 45 y.o.   MRN: [id number]   Assessment/Plan: ADMITTED WITH ANASARCA AND DECOMPENSATED LIVER DISEASE. CLINICALLY STABLE. C/O ACUTE INCREASE IN LEFT ARM PAIN. PT MAY HAVE LUE DVT.  PLAN: 1. LUE ULTRASOUND TODAY 2. OXYCODONE Q4H PRN 3. SUPPORTIVE CARE   Subjective: Since I last evaluated the patient HE'S BEEN HAVING LEFT ARM PAIN. SWELLING IN LUE FOR PAST WEEK. WOULD LIKE PAIN MEDS FOR LEFT ARM. HAD A NOSE BLEED TODAY. No ADDITIONAL questions or concerns.   Objective: Vital signs in last 24 hours: Vitals:   01/14/16 2148 01/15/16 0514  BP: 138/65 (!) 111/55  Pulse: 73 63  Resp: 20 20  Temp: 98.3 F (36.8 C) 98.3 F (36.8 C)     General appearance: alert, cooperative and no distress Resp: clear to auscultation bilaterally Cardio: regular rate and rhythm GI: soft, non-tender; bowel sounds normal; OBESE Extremities: edema 2-3+ LEFT UPPER EXTREMITY  Lab Results:  K 3.7 Cr 1.58 Hb 8.2 PLT CT 108   Studies/Results: No results found.  Medications: I have reviewed the patient's current medications.   LOS: 5 days   [name] 11/05/2013, 2:23 PM

## 2016-01-15 NOTE — Progress Notes (Signed)
Subjective: Interval History: Patient complains of some pain of his left hand where he has an IV infiltrated. He denies any difficulty breathing. Overall feels much better.  Objective: Vital signs in last 24 hours: Temp:  [98.3 F (36.8 C)-98.4 F (36.9 C)] 98.3 F (36.8 C) (08/20 0514) Pulse Rate:  [61-73] 63 (08/20 0514) Resp:  [20] 20 (08/20 0514) BP: (111-138)/(55-65) 111/55 (08/20 0514) SpO2:  [100 %] 100 % (08/20 0514) Weight:  [172.5 kg (380 lb 4.7 oz)] 172.5 kg (380 lb 4.7 oz) (08/20 0514) Weight change: -4 kg (-8 lb 13.1 oz)  Intake/Output from previous day: 08/19 0701 - 08/20 0700 In: 990 [P.O.:924; IV Piggyback:66] Out: [id number] [Urine:4700] Intake/Output this shift: No intake/output data recorded.  General appearance: alert, cooperative and no distress Resp: diminished breath sounds bilaterally Cardio: regular rate and rhythm, S1, S2 normal, no murmur, click, rub or gallop Extremities: edema He has 3+ edema bilaterally  Lab Results:  Recent Labs  01/14/16 0557 01/15/16 0645  WBC 11.7* 10.3  HGB 8.0* 8.2*  HCT 24.6* 25.1*  PLT 100* 108*   BMET:   Recent Labs  01/14/16 0557 01/15/16 0645  NA 135 136  K 3.8 3.7  CL 100* 101  CO2 27 27  GLUCOSE 81 93  BUN 47* 45*  CREATININE 1.66* 1.58*  CALCIUM 8.4* 8.5*   No results for input(s): PTH in the last 72 hours. Iron Studies: No results for input(s): IRON, TIBC, TRANSFERRIN, FERRITIN in the last 72 hours.  Studies/Results: No results found.  I have reviewed the patient's current medications.  Assessment/Plan: Problem #1acute renal failure: Most likely prerenal versus ATN. His renal function Is progressively improving and patient is asymptomatic. Problem #2 anasarca: Patient on IV Lasix/Aldactone. Patient had 4700 mL of urine output. Patient has lost about 28 pounds the last 3 days. At this moment is still a significant anasarca. Problem #3 hypokalemia: Most likely secondary to diuretics. Patient on  potassium supplementation and Aldactone. His potassium remains normal. Problem #4 anemia: Patient has received blood transfusion and his hemoglobin is much better. Problem #5 hyponatremia: Hypervolemic hyponatremia; his sodium has corrected.  Problem #6 liver cirrhosis  Problem#7 Obesity  plan: 1] We'll D/C iv lasix           2] Patient advised to decrease his salt and fluid intake.           3] We'll continue with kcl supplement           4] We will start on Demadex 60 mg po once a day           5] We'll check his renal panel in the morning  LOS: 11 days   Kendale Rembold S 01/15/2016,8:48 AM

## 2016-01-16 ENCOUNTER — Encounter (HOSPITAL_COMMUNITY): Payer: Self-pay | Admitting: Primary Care

## 2016-01-16 DIAGNOSIS — Z7189 Other specified counseling: Secondary | ICD-10-CM

## 2016-01-16 DIAGNOSIS — Z515 Encounter for palliative care: Secondary | ICD-10-CM

## 2016-01-16 DIAGNOSIS — R600 Localized edema: Secondary | ICD-10-CM

## 2016-01-16 LAB — RENAL FUNCTION PANEL
ALBUMIN: 2.7 g/dL — AB (ref 3.5–5.0)
Anion gap: 6 (ref 5–15)
BUN: 43 mg/dL — AB (ref 6–20)
CALCIUM: 8.4 mg/dL — AB (ref 8.9–10.3)
CO2: 29 mmol/L (ref 22–32)
Chloride: 101 mmol/L (ref 101–111)
Creatinine, Ser: 1.53 mg/dL — ABNORMAL HIGH (ref 0.61–1.24)
GFR calc Af Amer: >60 mL/min (ref >60)
GFR calc non Af Amer: 54 mL/min — ABNORMAL LOW (ref >60)
GLUCOSE: 81 mg/dL (ref 65–99)
PHOSPHORUS: 5.1 mg/dL — AB (ref 2.5–4.6)
Potassium: 3.8 mmol/L (ref 3.5–5.1)
SODIUM: 136 mmol/L (ref 135–145)

## 2016-01-16 LAB — CBC
HCT: 26.7 % — ABNORMAL LOW (ref 39.0–52.0)
Hemoglobin: 8.5 g/dL — ABNORMAL LOW (ref 13.0–17.0)
MCH: 29.9 pg (ref 26.0–34.0)
MCHC: 31.8 g/dL (ref 30.0–36.0)
MCV: 94 fL (ref 78.0–100.0)
Platelets: 111 10*3/uL — ABNORMAL LOW (ref 150–400)
RBC: 2.84 MIL/uL — ABNORMAL LOW (ref 4.22–5.81)
RDW: 20.1 % — AB (ref 11.5–15.5)
WBC: 9 10*3/uL (ref 4.0–10.5)

## 2016-01-16 MED ORDER — TORSEMIDE 20 MG PO TABS
40.0000 mg | ORAL_TABLET | Freq: Every day | ORAL | Status: DC
  Administered 2016-01-16: 40 mg via ORAL
  Filled 2016-01-16: qty 2

## 2016-01-16 MED ORDER — MAGNESIUM OXIDE 400 (241.3 MG) MG PO TABS
400.0000 mg | ORAL_TABLET | Freq: Two times a day (BID) | ORAL | Status: DC
  Administered 2016-01-16 – 2016-01-17 (×3): 400 mg via ORAL
  Filled 2016-01-16 (×3): qty 1

## 2016-01-16 NOTE — Progress Notes (Signed)
Subjective: Interval History: The patient presently offers no complaints. He denies any difficulty breathing overall he feels good  Objective: Vital signs in last 24 hours: Temp:  [97.9 F (36.6 C)-98.1 F (36.7 C)] 97.9 F (36.6 C) (08/21 0500) Pulse Rate:  [58-66] 58 (08/21 0500) Resp:  [20] 20 (08/21 0500) BP: (102-128)/(49-56) 103/55 (08/21 0500) SpO2:  [97 %-98 %] 98 % (08/21 0500) Weight:  [182.4 kg (402 lb 3.2 oz)] 182.4 kg (402 lb 3.2 oz) (08/21 0500) Weight change: 9.937 kg (21 lb 14.5 oz)  Intake/Output from previous day: 08/20 0701 - 08/21 0700 In: 1800 [P.O.:1800] Out: 4500 [Urine:4500] Intake/Output this shift: No intake/output data recorded.  General appearance: alert, cooperative and no distress Resp: diminished breath sounds bilaterally Cardio: regular rate and rhythm, S1, S2 normal, no murmur, click, rub or gallop Extremities: edema He has 3+ edema bilaterally  Lab Results:  Recent Labs  01/14/16 0557 01/15/16 0645  WBC 11.7* 10.3  HGB 8.0* 8.2*  HCT 24.6* 25.1*  PLT 100* 108*   BMET:   Recent Labs  01/15/16 0645 01/16/16 0706  NA 136 136  K 3.7 3.8  CL 101 101  CO2 27 29  GLUCOSE 93 81  BUN 45* 43*  CREATININE 1.58* 1.53*  CALCIUM 8.5* 8.4*   No results for input([name]): PTH in the last 72 hours. Iron Studies: No results for input([name]): IRON, TIBC, TRANSFERRIN, FERRITIN in the last 72 hours.  Studies/Results: US Venous Img Upper Uni Left  Result Date: 01/15/2016 CLINICAL DATA:  Left upper extremity pain and edema. EXAM: LEFT UPPER EXTREMITY VENOUS DOPPLER ULTRASOUND TECHNIQUE: Gray-scale sonography with graded compression, as well as color Doppler and duplex ultrasound were performed to evaluate the upper extremity deep venous system from the level of the subclavian vein and including the jugular, axillary, basilic, radial, ulnar and upper cephalic vein. Spectral Doppler was utilized to evaluate flow at rest and with distal augmentation  maneuvers. COMPARISON:  None. FINDINGS: Contralateral Subclavian Vein: Respiratory phasicity is normal and symmetric with the symptomatic side. No evidence of thrombus. Normal compressibility. Internal Jugular Vein: No evidence of thrombus. Normal compressibility, respiratory phasicity and response to augmentation. Subclavian Vein: No evidence of thrombus. Normal compressibility, respiratory phasicity and response to augmentation. Axillary Vein: No evidence of thrombus. Normal compressibility, respiratory phasicity and response to augmentation. Cephalic Vein: No evidence of thrombus. Normal compressibility, respiratory phasicity and response to augmentation. Basilic Vein: No evidence of thrombus. Normal compressibility, respiratory phasicity and response to augmentation. Brachial Veins: No evidence of thrombus. Normal compressibility, respiratory phasicity and response to augmentation. Radial Veins: No evidence of thrombus. Normal compressibility, respiratory phasicity and response to augmentation. Ulnar Veins: No evidence of thrombus. Normal compressibility, respiratory phasicity and response to augmentation. Venous Reflux:  None visualized. Other Findings: No evidence of superficial thrombophlebitis or abnormal fluid collection. IMPRESSION: No evidence of left upper extremity deep venous thrombosis. Electronically Signed   By: Aletta Edouard M.D.   On: 01/15/2016 15:38    I have reviewed the patient'[name] current medications.  Assessment/Plan: Problem #1acute renal failure: Most likely prerenal versus ATN. His renal function continued to improve. Presently his creatinine is still above his baseline. Problem #2 anasarca: Patient on by mouth Demadex/Aldactone. Patient had 4500 mL of urine output. His leg edema seems to be improving but presently still patient with significant sign of fluid overload. Problem #3 hypokalemia: Most likely secondary to diuretics. Patient on potassium supplementation and Aldactone.  His potassium remains normal. Problem #4 anemia: Patient has  received blood transfusion and his hemoglobin has remained stable. Problem #5 hyponatremia: Hypervolemic hyponatremia; his sodium has corrected.  Problem #6 liver cirrhosis  Problem#7 Obesity  plan: 1] We'll decrease Demadex to 40 mg by mouth daily           2] we'll continue to encourage patient to decrease his salt and fluid intake           3] if patient is going to be discharged he needed to continue taking Demadex and metolazone.            4] we'll follow patient in 4 weeks           5] We'll check his renal panel in the morning  LOS: 12 days   Antonio [name] 01/16/2016,7:49 AM

## 2016-01-16 NOTE — Consult Note (Signed)
Consultation Note Date: 01/16/2016   Patient Name: Antonio [name]  DOB: 1970-09-22  MRN: [id number]  Age / Sex: 45 y.o., male  PCP: [name], MD Referring Physician: Rexene Alberts, MD  Reason for Consultation: Establishing goals of care, Hospice Evaluation and Psychosocial/spiritual support  HPI/Patient Profile: 45 y.o. male  with past medical history of alcoholic hepatitis and cirrhosis, alcoholic abuse admitted on 01/03/2016 with volume overload.   Clinical Assessment and Goals of Care: Antonio [name] is lying quietly in bed, greeting me as I enter. At bedside today are wife Antonio [name], mother Antonio [name], and brother Antonio [name]. We review Antonio [name] labs, and talk about chronic health problems related to liver and kidney disorder. We talk about medication management of fluid, fluid restrictions, dietary changes, lab draws, and doctors appointments.  I share that Antonio [name] looks older than 33, and the changes that occur both inside and outside the body, and that he has likely experienced changes in his major organs. We talk about ADLs, and my worry about decreasing functional status, including being able to bathe and dress himself.  Brother, Antonio [name], asks about transplant list. We talk about the difficulty in getting on the transplant list, and then getting a liver. We talk about being sober for 6 months, with 6 months of counseling prior to being listed. Family is interested in talking with physician about referral to [facility] for transplant list.  We talk about functional decline, I share the chronic illness pathway diagram.  I share my worry over his functional status and what this time may look like. Why states her worry is "losing him", mother states her worry is "not being able to take care of him".   I share the realities of decline with hepatic cirrhosis, including encephalopathy, what if he's too confused to take his  medications?, How will they turn him if he becomes bedbound.  Antonio [name] states that he does not worry, but that he is trying to support his brother and family the best he can. 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   We talk about home health benefits in detail including services and equipment. We talk about hospice benefits in detail also.  Family asks about setting up appointments for MD visits upon leaving the hospital.   We talk about advanced directives. I share the realities of CPR including chest compressions, and intubation. I share that I worry if he would be so sick to need these interventions that he would not do well, and that these interventions would not change his liver or kidney disease.  Antonio [name] is prepared to make any decisions at this time. I encourage him to think about his choices, and that it is normal for these choices to change as our health changes.   We talk about prognosis, Antonio [name] states he does not want to know prognosis. That he does not want to have to do a "countdown".  Family meets me outside of the room after our conversation and asks about prognosis. I share  that he likely has 6 months or less, but share that they will see either a decline or an improvement, and this will let them know if he is getting closer to passing.  Health care Power of atty.  NEXT OF KIN - no formal paperwork. Antonio [name] states he wants his family to make decisions together.    SUMMARY OF RECOMMENDATIONS   Continue to treat the treatable.  At this point continue to focus towards cure, but family is aware to look for functional decline. We discuss the benefits of hospice and that this may be the best choice in the future.   Code Status/Advance Care Planning:  Full code  Symptom Management:   Per hospitalist  Palliative Prophylaxis:   None at this time  Additional Recommendations (Limitations, Scope, Preferences):  Full Scope Treatment  Psycho-social/Spiritual:   Desire for  further Chaplaincy support:no  Additional Recommendations: Caregiving  Support/Resources and Education on Hospice  Prognosis:   < 6 months, likely based on chronic illness disease burden, hepatic or renal syndrome, MELD score 28  Discharge Planning: Home with Home Health      Primary Diagnoses: Present on Admission: . Alcoholic cirrhosis of liver with ascites ([place]) . DOE (dyspnea on exertion) . Alcohol abuse . Anemia, unspecified . Decompensation of cirrhosis of liver ([place]) . Peripheral edema . Anasarca . Thrombocytopenia ([place]) . Encephalopathy, hepatic ([place]) . Coagulopathy ([place]) . Bilateral edema of lower extremity   I have reviewed the medical record, interviewed the patient and family, and examined the patient. The following aspects are pertinent.  Past Medical History:  Diagnosis Date  . Alcohol abuse   . Alcoholic hepatitis   . Cirrhosis with alcoholism [facility])    Social History   Social History  . Marital status: Married    Spouse name: [name]  . Number of children: [name]  . Years of education: [name]   Social History Main Topics  . Smoking status: Current Some Day Smoker    Packs/day: 0.25    Types: Cigarettes  . Smokeless tobacco: Never Used  . Alcohol use 3.6 oz/week    6 Cans of beer per week     Comment: drinks 4-5 beers a day and 1/2 pint of liquor daily   . Drug use: No  . Sexual activity: Yes   Other Topics Concern  . None   Social History Narrative  . None   Family History  Problem Relation Age of Onset  . Colon cancer Neg Hx   . Liver disease Neg Hx    Scheduled Meds: . sodium chloride   Intravenous Once  . sodium chloride   Intravenous Once  . sodium chloride   Intravenous Once  . folic acid  1 mg Oral Daily  . lactulose  20 g Oral TID  . magnesium oxide  400 mg Oral BID  . nadolol  40 mg Oral BID  . pantoprazole  40 mg Oral BID AC  . potassium chloride  40 mEq Oral BID  . sodium chloride flush  3 mL Intravenous Q12H  . spironolactone   50 mg Oral Daily  . thiamine  100 mg Oral Daily  . torsemide  40 mg Oral Daily   Continuous Infusions:  PRN Meds:.sodium chloride, lactulose, oxyCODONE, sodium chloride flush Medications Prior to Admission:  Prior to Admission medications   Medication Sig Start Date End Date Taking? Authorizing Provider  Calcium Carb-Cholecalciferol (CALCIUM 600+D) 600-800 MG-UNIT TABS Take 1 tablet by mouth daily.   Yes Historical  Provider, MD  folic acid (FOLVITE) 1 MG tablet Take 1 tablet (1 mg total) by mouth daily. 12/31/15  Yes Orvan Falconer, MD  lactulose (CHRONULAC) 10 GM/15ML solution Take 15 mLs (10 g total) by mouth 3 (three) times daily as needed for mild constipation. 12/31/15  Yes Orvan Falconer, MD  nadolol (CORGARD) 40 MG tablet Take 1 tablet (40 mg total) by mouth 2 (two) times daily. 12/31/15  Yes Orvan Falconer, MD  pantoprazole (PROTONIX) 40 MG tablet Take 1 tablet (40 mg total) by mouth 2 (two) times daily before a meal. 12/31/15  Yes Orvan Falconer, MD  potassium chloride SA (K-DUR,KLOR-CON) 20 MEQ tablet Take 20 mEq by mouth 2 (two) times daily.   Yes Historical Provider, MD  prednisoLONE (ORAPRED) 15 MG/5ML solution Take 13.3 mLs (40 mg total) by mouth daily before breakfast. 12/31/15  Yes Orvan Falconer, MD  spironolactone (ALDACTONE) 100 MG tablet Take 100 mg by mouth daily.   Yes Historical Provider, MD  thiamine 100 MG tablet Take 1 tablet (100 mg total) by mouth daily. 12/31/15  Yes Orvan Falconer, MD  rifaximin (XIFAXAN) 550 MG TABS tablet Take 1 tablet (550 mg total) by mouth 2 (two) times daily. 12/31/15   Orvan Falconer, MD   No Known Allergies Review of Systems  Unable to perform ROS: Other    Physical Exam  Constitutional: He is oriented to person, place, and time. No distress.  Morbidly obese, NAD  HENT:  Head: Normocephalic and atraumatic.  Pulmonary/Chest: Effort normal. No respiratory distress.  Abdominal: Soft.  Obese abdomen.   Musculoskeletal: He exhibits edema.  Neurological: He is alert and oriented to  person, place, and time.  Skin: Skin is warm and dry.  Psychiatric: He has a normal mood and affect. Thought content normal.  Nursing note and vitals reviewed.   Vital Signs: BP (!) 113/56   Pulse 70   Temp 97.9 F (36.6 C) (Oral)   Resp 20   Ht 6\' 2"  (1.88 m)   Wt (!) 182.4 kg (402 lb 3.2 oz)   SpO2 98%   BMI 51.64 kg/m  Pain Assessment: No/denies pain   Pain Score: 0-No pain   SpO2: SpO2: 98 % O2 Device:SpO2: 98 % O2 Flow Rate: .   IO: Intake/output summary:  Intake/Output Summary (Last 24 hours) at 01/16/16 1304 Last data filed at 01/16/16 0915  Gross per 24 hour  Intake             1560 ml  Output             4900 ml  Net            -3340 ml    LBM: Last BM Date: 01/14/16 Baseline Weight: Weight: (!) 161.9 kg (357 lb) Most recent weight: Weight: (!) 182.4 kg (402 lb 3.2 oz)     Palliative Assessment/Data:   Flowsheet Rows   Flowsheet Row Most Recent Value  Intake Tab  Referral Department  Hospitalist  Unit at Time of Referral  Med/Surg Unit  Palliative Care Primary Diagnosis  Cancer  Date Notified  01/14/16  Palliative Care Type  New Palliative care  Reason for referral  Clarify Goals of Care, Counsel Regarding Hospice  Date of Admission  01/03/16  Date first seen by Palliative Care  01/16/16  # of days Palliative referral response time  2 Day(s)  # of days IP prior to Palliative referral  11  Clinical Assessment  Palliative Performance Scale Score  40%  Pain  Max last 24 hours  Not able to report  Pain Min Last 24 hours  Not able to report  Dyspnea Max Last 24 Hours  Not able to report  Dyspnea Min Last 24 hours  Not able to report  Psychosocial & Spiritual Assessment  Palliative Care Outcomes  Patient/Family meeting held?  Yes  Who was at the meeting?  patient, wife, mother and brother.   Palliative Care Outcomes  Counseled regarding hospice, Provided advance care planning, Clarified goals of care, Provided psychosocial or spiritual support    Palliative Care follow-up planned  -- [follow up while at [facility]]      Time In: 1100 Time Out: 1220 Time Total: 80 minutes Greater than 50%  of this time was spent counseling and coordinating care related to the above assessment and plan.  Signed by: Drue Novel, NP   Please contact Palliative Medicine Team phone at [phone] for questions and concerns.  For individual provider: See Shea Evans

## 2016-01-16 NOTE — Progress Notes (Signed)
PROGRESS NOTE    Antonio [name]  [id number] DOB: 1970/08/14 DOA: 01/03/2016 PCP: Deloria Lair, MD  Outpatient Specialists: None  Brief Narrative: 28 yom with hx of alcoholic cirrhosis of liver, alcohol abuse, GERD, morbid obesity, and anemia of chronic disease, was recently discharged a few days ago for a GI bleed after receiving 2 units of PRBCs. He presents today with complaints of progressively worsened swelling in his legs and SOB. While in the ED, he was noted to have volume overload. Pt was readmitted for further management of his swelling and SOB. EKG showed sinus or ectopic atrial rhythm. ECHO showed EF of 60-65%. He was noted to have decompensation of cirrhosis of the liver. Started on IV lasix and albumin to aid diuresis. Diuresis with lasix has been unimpressive thus far, so  metolozone was added. He has been noted to be anemic, and has received 3 units pRBCs thus far.   Assessment & Plan:   Principal Problem:   Decompensation of cirrhosis of liver (HCC) Active Problems:   Alcoholic cirrhosis of liver with ascites (HCC)   Anasarca   Thrombocytopenia (HCC)   Coagulopathy (HCC)   Anemia, unspecified   Alcohol abuse   DOE (dyspnea on exertion)   Peripheral edema   Encephalopathy, hepatic (HCC)   Acute renal failure (HCC)   Bilateral edema of lower extremity   Palliative care encounter   DNR (do not resuscitate) discussion   Goals of care, counseling/discussion   Decompensation of alcoholic cirrhosis of liver. Patient was restarted on nadolol and rifaximin. GI was consulted and continued to follow the patient and make management recommendations. Due to massive volume overload and a low albumin of 2 or less, he was started on IV Lasix and albumin infusions. He was continued on Aldactone. His fluid intake was restricted. He was given a low sodium diet. Eventually metolazone was added but was discontinued. -Per GI, his AFP was normal. Ultrasound of his abdomen with Doppler on  8/16 revealed evidence of known cirrhosis, patent portal vein with normal hepatopedal flow; mild cholelithiasis but no sonographic evidence to suggest cholecystitis. -Per GI, prednisolone was started and continued for a few days before it was discontinued. -As of 8/18, his MELD (20%-3 month mortality).  -Per discussion with Dr. Oneida Alar, his life expectancy is less than 6 months and he is not a transplant candidate because he has been drinking alcohol up until 3 weeks ago. She discussed DNR status and home health hospice with the patient and family. They have not made the decision yet. Palliative care consult will be ordered.  Acute renal failure. Patient's creatinine was 1.03 on admission. It  started trending up with diuresis to a high of 2.0.? Element of hepatorenal syndrome. Nephrology was consulted to assist with diuresis. -Dr. Lowanda Foster increased the IV Lasix to 160 mg IV twice a day; increased Aldactone to 50 mg daily. -There continues to be improvement in his creatinine.  Hepatic encephalopathy. Patient's ammonia level was noted to be elevated. He was started on lactulose. He was continued on rifaximin. -Follow-up ammonia level improved from 140-72.  Alcohol abuse.  Has been alcohol free for 3 weeks. No signs of alcohol withdrawal at this time. Continue thiamine.   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 His hemoglobin was 7.3 on admission and appears to range from 6-8 g. Anemia panel indicated iron deficiency. He was transfused 3  units pRBC early in the hospital course with improvement in his Hb by only 1 gram. Hemolytic process was a concern. - Hematology consulted. Their assessment noted and appreciated. The patient was given 1 dose of Feraheme on 01/10/16. Per hematology, it can be repeated in 3-8 days if necessary.  There may be an element of hemolysis minimal to mild given his mildly elevated LDH and mildly low haptoglobin and elevated indirect bilirubin. DAT was negative. G6PD slightly elevated-significance not clear. Pathology smear ordered. -GI noted that the patient has no overt GI bleeding but he likely has some lower GI tract oozing/chronic blood loss in the setting of known coagulopathy. He is not a candidate for colonoscopy at this time. -Patient also has intermittent oozing from his central line. - His Hb trended down again to 6.7. He was transfused another 2 units for total of 5 units at all. His hemoglobin improved to 8.0. I informed the patient that ongoing transfusions are doing little to improve his prognosis and I would favor not transfusing any longer. This was discussed with Dr. Oneida Alar who was in agreement.  Severe coagulopathy, secondary to cirrhosis. Patient's baseline INR is 2-2.46. It was 2.10 on admission. Subcutaneous vitamin K was given 3 days. -His INR has not improved and remains above 2.  Thrombocytopenia.  Patient's baseline platelet count ranges from 93-105. It was 103 on admission. It has remained at baseline. 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 We'll continue to monitor.  Possible DVT in RUE.  Patient's RUE Mid line infiltrated resulting in swelling. Venous dopplers could not rule out DVT. Regardless, with his significant anemia and underlying coagulopathy with INR of 2, he is not a candidate for anticoagulation. Hematology/oncology agreed.  Left upper extremity edema. -Left upper extremity Doppler was negative for DVT. Edema is likely from overall anasarca.  Hypokalemia. With IV Lasix, the patient's serum potassium decreased. Oral potassium chloride supplementation started. We'll continue to monitor.  Morbid obesity GERD. Continue on PPI.  Discussion: Patient's cirrhosis appears to be end-stage. GI, Dr. Oneida Alar had recommended palliative care and hospice to  the patient during a previous hospitalization. Oncology also recommends a palliative care consult. Previous hospitalist, Dr. Roderic Palau had a long discussion with the patient and his wife and they were in agreement to a palliative care consult if he continued to decline. On 01/13/16 I approached the patient and his wife for a discussion about their understanding of his prognosis and what they understood the other doctors were telling them about his liver. The patient did not acknowledge that he was told that his prognosis was poor and that palliative care and subsequently hospice was recommended. His wife did acknowledge that she was told that his liver was such that there was no cure other than a liver transplant. However neither the patient nor his wife acknowledged being told that palliative care and hospice were recommended due to his poor prognosis.  -I discussed the patient with gastroenterologist Dr. Oneida Alar. She had a discussion again with the patient, his wife, and other family members on 01/14/16. Her insight, recommendations are noted and greatly appreciated. In essence, she recommended DNR status and hospice and informed them that his life expectancy was less than 6 months.  -We'll ask for a palliative care consult as they appear to be more receptive now.   DVT prophylaxis: SCDs Code Status: Full Family Communication: Family not available Disposition Plan: Discharge once improved.   Consultants:   Gastroenterology  Procedures:  2 units packed red blood cell  transfusion on 8/18  1 unit pRBC transfusion 8/12  2 units pRBC transfusion 8/13  Mid line placed 8/12-d/c'd;  right IJ central line.  ECHO Study Conclusions - Left ventricle: The cavity size was normal. Wall thickness was   increased in a pattern of moderate LVH. Systolic function was   normal. The estimated ejection fraction was in the range of 60%   to 65%. Diastolic function is abnormal, indeterminate grade. Wall   motion  was normal; there were no regional wall motion   abnormalities. - Aortic valve: Valve area (VTI): 3.9 cm^2. Valve area (Vmax): 3.74   cm^2. Valve area (Vmean): 4.11 cm^2. - Left atrium: The atrium was mildly dilated. - Technically adequate study.  Antimicrobials:  None   Subjective: Patient denies chest pain and shortness of breath. Family is in the room.  Objective: Vitals:   01/15/16 2200 01/16/16 0500 01/16/16 0931 01/16/16 1355  BP: (!) 102/49 (!) 103/55 (!) 113/56 (!) 110/54  Pulse: 64 (!) 58 70 65  Resp: 20 20  20   Temp: 98.1 F (36.7 C) 97.9 F (36.6 C)  98.1 F (36.7 C)  TempSrc: Oral Oral  Oral  SpO2: 97% 98%  100%  Weight:  (!) 182.4 kg (402 lb 3.2 oz)    Height:        Intake/Output Summary (Last 24 hours) at 01/16/16 1647 Last data filed at 01/16/16 1357  Gross per 24 hour  Intake             1680 ml  Output             5200 ml  Net            -3520 ml   Filed Weights   01/14/16 0500 01/15/16 0514 01/16/16 0500  Weight: (!) 176.5 kg (389 lb 1.8 oz) (!) 172.5 kg (380 lb 4.7 oz) (!) 182.4 kg (402 lb 3.2 oz)   Examination:  General exam: Appears calm and comfortable.   Neck: Right IJ central line noted with dressing-not blood tinged today. Respiratory system: Clear to auscultation. Respiratory effort normal. Cardiovascular system: S1 & S2 heard, RRR. No JVD, murmurs, rubs, gallops or clicks. 3-4+ pitting lower extremity/ pedal edema with anasarca. 2-3+ LUE edema and trace to 1+ RUE extremity edema. Gastrointestinal system: Abdomen is distended +/- acites, soft and nontender. No organomegaly or masses felt. Normal bowel sounds heard. Central nervous system: Alert and less lethargic. Cranial nerves II through XII are intact. No asterixis. Extremities: Symmetric 5 x 5 power. Skin: Brawny edema of his upper extremities and lower extremities; tattoo on upper extremities. Psychiatry: Alert and oriented 3. Flat affect.   Data Reviewed: I have personally  reviewed following labs and imaging studies  CBC:  Recent Labs Lab 01/12/16 0629 01/13/16 0645 01/14/16 0557 01/15/16 0645 01/16/16 0706  WBC 9.3 10.5 11.7* 10.3 9.0  HGB 6.9* 6.7* 8.0* 8.2* 8.5*  HCT 22.3* 20.8* 24.6* 25.1* 26.7*  MCV 94.9 91.7 92.5 91.9 94.0  PLT 98* 102* 100* 108* [id number]*   Basic Metabolic Panel:  Recent Labs Lab 01/11/16 0714 01/12/16 0629 01/13/16 0645 01/14/16 0557 01/15/16 0645 01/16/16 0706  NA 133* 132* 136 135 136 136  K 3.5 3.1* 3.6 3.8 3.7 3.8  CL 100* 99* 101 100* 101 101  CO2 26 27 25 27 27 29   GLUCOSE 114* 80 85 81 93 81  BUN 48* 47* 48* 47* 45* 43*  CREATININE 2.01* 1.70* 1.64* 1.66* 1.58* 1.53*  CALCIUM 8.5* 8.9 9.2  8.4* 8.5* 8.4*  PHOS 5.4*  --  4.5 4.8* 5.0* 5.1*   GFR: Estimated Creatinine Clearance: 107.7 mL/min (by C-G formula based on SCr of 1.53 mg/dL). Liver Function Tests:  Recent Labs Lab 01/12/16 0629 01/13/16 0645 01/14/16 0557 01/15/16 0645 01/16/16 0706  AST 69* 65*  --   --   --   ALT 32 30  --   --   --   ALKPHOS 111 106  --   --   --   BILITOT 4.0* 4.0*  --   --   --   PROT 6.7 6.6  --   --   --   ALBUMIN 3.0* 3.1*  3.1* 2.9* 2.8* 2.7*   Coagulation Profile:  Recent Labs Lab 01/11/16 0713 01/13/16 0645  INR 2.15 2.28   Urine analysis:    Component Value Date/Time   COLORURINE AMBER (A) 12/27/2015 2229   APPEARANCEUR CLEAR 12/27/2015 2229   LABSPEC 1.010 12/27/2015 2229   PHURINE 6.0 12/27/2015 2229   GLUCOSEU NEGATIVE 12/27/2015 2229   HGBUR LARGE (A) 12/27/2015 2229   BILIRUBINUR MODERATE (A) 12/27/2015 2229   KETONESUR NEGATIVE 12/27/2015 2229   PROTEINUR TRACE (A) 12/27/2015 2229   NITRITE NEGATIVE 12/27/2015 2229   LEUKOCYTESUR NEGATIVE 12/27/2015 2229   Scheduled Meds: . sodium chloride   Intravenous Once  . sodium chloride   Intravenous Once  . sodium chloride   Intravenous Once  . folic acid  1 mg Oral Daily  . lactulose  20 g Oral TID  . magnesium oxide  400 mg Oral BID  .  nadolol  40 mg Oral BID  . pantoprazole  40 mg Oral BID AC  . potassium chloride  40 mEq Oral BID  . sodium chloride flush  3 mL Intravenous Q12H  . spironolactone  50 mg Oral Daily  . thiamine  100 mg Oral Daily  . torsemide  40 mg Oral Daily   Continuous Infusions:    LOS: 12 days   Time spent: 25 minutes  Rexene Alberts, MD [facility]  If 7PM-7AM, please contact night-coverage [email or web address] Password [facility] 01/16/2016, 4:47 PM

## 2016-01-16 NOTE — Progress Notes (Signed)
REVIEWED. AGREE. NO ADDITIONAL RECOMMENDATIONS.  Subjective: No abdominal pain. No N/V. Tolerating diet. No confusion.   Objective: Vital signs in last 24 hours: Temp:  [97.9 F (36.6 C)-98.1 F (36.7 C)] 97.9 F (36.6 C) (08/21 0500) Pulse Rate:  [58-66] 58 (08/21 0500) Resp:  [20] 20 (08/21 0500) BP: (102-128)/(49-56) 103/55 (08/21 0500) SpO2:  [97 %-98 %] 98 % (08/21 0500) Weight:  [402 lb 3.2 oz (182.4 kg)] 402 lb 3.2 oz (182.4 kg) (08/21 0500) Last BM Date: 01/14/16 General:   Alert and oriented, pleasant Abdomen:  Bowel sounds present, obese, massive anasarca, multiple striae  No rebound or guarding.  Extremities:  3-4+ pitting edema  Neurologic:  Alert and  oriented x4; negative asterixis  Psych:  Alert and cooperative. Flat affect   Intake/Output from previous day: 08/20 0701 - 08/21 0700 In: 1800 [P.O.:1800] Out: 4500 [Urine:4500] Intake/Output this shift: No intake/output data recorded.  Lab Results:  Recent Labs  01/14/16 0557 01/15/16 0645 01/16/16 0706  WBC 11.7* 10.3 9.0  HGB 8.0* 8.2* 8.5*  HCT 24.6* 25.1* 26.7*  PLT 100* 108* 111*   BMET  Recent Labs  01/14/16 0557 01/15/16 0645 01/16/16 0706  NA 135 136 136  K 3.8 3.7 3.8  CL 100* 101 101  CO2 27 27 29   GLUCOSE 81 93 81  BUN 47* 45* 43*  CREATININE 1.66* 1.58* 1.53*  CALCIUM 8.4* 8.5* 8.4*   LFT  Recent Labs  01/14/16 0557 01/15/16 0645 01/16/16 0706  ALBUMIN 2.9* 2.8* 2.7*     Studies/Results: US Venous Img Upper Uni Left  Result Date: 01/15/2016 CLINICAL DATA:  Left upper extremity pain and edema. EXAM: LEFT UPPER EXTREMITY VENOUS DOPPLER ULTRASOUND TECHNIQUE: Gray-scale sonography with graded compression, as well as color Doppler and duplex ultrasound were performed to evaluate the upper extremity deep venous system from the level of the subclavian vein and including the jugular, axillary, basilic, radial, ulnar and upper cephalic vein. Spectral Doppler was utilized to  evaluate flow at rest and with distal augmentation maneuvers. COMPARISON:  None. FINDINGS: Contralateral Subclavian Vein: Respiratory phasicity is normal and symmetric with the symptomatic side. No evidence of thrombus. Normal compressibility. Internal Jugular Vein: No evidence of thrombus. Normal compressibility, respiratory phasicity and response to augmentation. Subclavian Vein: No evidence of thrombus. Normal compressibility, respiratory phasicity and response to augmentation. Axillary Vein: No evidence of thrombus. Normal compressibility, respiratory phasicity and response to augmentation. Cephalic Vein: No evidence of thrombus. Normal compressibility, respiratory phasicity and response to augmentation. Basilic Vein: No evidence of thrombus. Normal compressibility, respiratory phasicity and response to augmentation. Brachial Veins: No evidence of thrombus. Normal compressibility, respiratory phasicity and response to augmentation. Radial Veins: No evidence of thrombus. Normal compressibility, respiratory phasicity and response to augmentation. Ulnar Veins: No evidence of thrombus. Normal compressibility, respiratory phasicity and response to augmentation. Venous Reflux:  None visualized. Other Findings: No evidence of superficial thrombophlebitis or abnormal fluid collection. IMPRESSION: No evidence of left upper extremity deep venous thrombosis. Electronically Signed   By: Aletta Edouard M.D.   On: 01/15/2016 15:38    Assessment: 45 year old male with decompensated cirrhosis but clinically stable, agree with palliative care consult. No overt GI bleeding at this time. Limited options from a GI standpoint.     Plan: Continue lactulose dosing, Xifaxan BID Protonix BID Gentle diuresis  Appreciate Nephrology input and involvement Palliative consult today Poor prognosis with less than 6 month less expectancy    Annitta Needs, ANP-BC [facility] Gastroenterology  LOS: 12 days    01/16/2016,  9:26 AM

## 2016-01-16 NOTE — Care Management Note (Signed)
Case Management Note  Patient Details  Name: Eusevio Sabra MRN: [id number] Date of Birth: 1970/12/16  If discussed at Long Length of Stay Meetings, dates discussed:    Additional Comments: After palliative consult today, patient has elected to go home with home health services. Offered choice, patient would like to use [facility]. Adwinna of [facility] notified. However, patient has BCBS and may not be eligible for home health. Adwinna with [facility] will assess Insurance and get back with me. Patient also would like a wheelchair, will contact [facility] for eligibility of wheelchair.  Isela Stantz, Chauncey Reading, RN 01/16/2016, 3:22 PM

## 2016-01-16 NOTE — Progress Notes (Addendum)
PROGRESS NOTE    Antonio [name]  [id number] DOB: 05-Jun-1970 DOA: 01/03/2016 PCP: Deloria Lair, MD  Outpatient Specialists: None  Brief Narrative: 20 yom with hx of alcoholic cirrhosis of liver, alcohol abuse, GERD, morbid obesity, and anemia of chronic disease, was recently discharged a few days ago for a GI bleed after receiving 2 units of PRBCs. He presented with complaints of progressively worsened swelling in his legs and SOB. While in the ED, he was noted to have volume overload. Pt was readmitted for further management of his swelling and SOB.  Assessment & Plan:   Principal Problem:   Decompensation of cirrhosis of liver (HCC) Active Problems:   Alcoholic cirrhosis of liver with ascites (HCC)   Anasarca   Thrombocytopenia (HCC)   Coagulopathy (HCC)   Anemia, unspecified   Alcohol abuse   DOE (dyspnea on exertion)   Peripheral edema   Encephalopathy, hepatic (HCC)   Acute renal failure (HCC)   Bilateral edema of lower extremity   Palliative care encounter   DNR (do not resuscitate) discussion   Goals of care, counseling/discussion   Edema of upper extremity   Decompensation of alcoholic cirrhosis of liver. Patient was restarted on nadolol and rifaximin. GI was consulted and continued to follow the patient and make management recommendations. Due to massive volume overload and a low albumin of 2 or less, he was started on IV Lasix and albumin infusions. He was continued on Aldactone. His fluid intake was restricted. He was given a low sodium diet. Eventually metolazone was added but was discontinued. -Per GI, his AFP was normal. Ultrasound of his abdomen with Doppler on 8/16 revealed evidence of known cirrhosis, patent portal vein with normal hepatopedal flow; mild cholelithiasis but no sonographic evidence to suggest cholecystitis. -Per GI, prednisolone was started and continued for a few days before it was discontinued. -As of 8/18, his MELD (20%-3 month mortality).    -Per discussion with Dr. Oneida Alar, his life expectancy is less than 6 months and he is not a transplant candidate because he has been drinking alcohol up until 3 weeks ago. She discussed DNR status and home health hospice with the patient and family. Palliative care was consulted. Following their conversation, neither the patient with the family is ready for DO NOT RESUSCITATE status and hospice.  Acute renal failure. Patient's creatinine was 1.03 on admission. It  started trending up with diuresis to a high of 2.0.? Element of hepatorenal syndrome. Nephrology was consulted to assist with diuresis. -Dr. Lowanda Foster increased the IV Lasix to 160 mg IV twice a day; increased Aldactone to 50 mg daily. -The regimen has now been changed to torsemide 40 mg daily and Aldactone 50 mg daily. -There continues to be improvement in his creatinine. -His I/O's -6.2 L.  Hepatic encephalopathy. Patient's ammonia level was noted to be elevated. He was started on lactulose. He was continued on rifaximin. -Follow-up ammonia level improved from 140 to 72.  Alcohol abuse.  Has been alcohol free for 3 weeks. No signs of alcohol withdrawal at this time. Continue thiamine.   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 His hemoglobin was 7.3 on admission and appears to range from 6-8 g. Anemia panel indicated iron deficiency. He was transfused 3 units pRBC early in the hospital course with improvement in his Hb by only 1 gram. Hemolytic process was a concern. - Hematology consulted.  Their assessment noted and appreciated. The patient was given 1 dose of Feraheme on 01/10/16. Per hematology, it can be repeated in 3-8 days if necessary. There may be an element of hemolysis minimal to mild given his mildly elevated LDH and mildly low haptoglobin and elevated  indirect bilirubin. DAT was negative. G6PD slightly elevated-significance not clear. Pathology smear ordered. -GI noted that the patient has no overt GI bleeding but he likely has some lower GI tract oozing/chronic blood loss in the setting of known coagulopathy. He is not a candidate for colonoscopy at this time. -Patient also had intermittent oozing from his central line. - His Hb trended down again to  6.7. He was transfused another 2 units for total of 5 units in all. His hemoglobin improved to 8.0>>8.5. I informed the patient that ongoing transfusions are doing little to improve his prognosis and I would favor not transfusing any longer. This was discussed with Dr. Oneida Alar who was in agreement.  Severe coagulopathy, secondary to cirrhosis. Patient's baseline INR is 2-2.46. It was 2.10 on admission. Subcutaneous vitamin K was given 3 days. -His INR has not improved and remains above 2.  Thrombocytopenia.  Patient's baseline platelet count ranges from 93-105. It was 103 on admission. It has remained at baseline. 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 We'll continue to monitor.  Possible DVT in RUE.  Patient's RUE Mid line infiltrated resulting in swelling. Venous dopplers could not rule out DVT. Regardless, with his significant anemia and underlying coagulopathy with INR of 2, he is not a candidate for anticoagulation. Hematology/oncology agreed.  Left upper extremity edema. -Left upper extremity Doppler was negative for DVT. Edema is likely from overall anasarca.  Hypokalemia. With IV Lasix, the patient's serum potassium decreased. Oral potassium chloride supplementation started. We'll continue to monitor.  Morbid obesity GERD. Continue on PPI.  Discussion: Patient's cirrhosis appears to be end-stage. GI, Dr. Oneida Alar had recommended palliative care and hospice to the patient during a previous hospitalization. Oncology also recommends a palliative care consult. Previous  hospitalist, Dr. Roderic Palau had a long discussion with the patient and his wife and they were in agreement to a palliative care consult if he continued to decline. On 01/13/16 I approached the patient and his wife for a discussion about their understanding of his prognosis and what they understood the other doctors were telling them about his liver. The patient did not acknowledge that he was told that his prognosis was poor and that palliative care and subsequently hospice was recommended. His wife did acknowledge that she was told that his liver was such that there was no cure other than a liver transplant. However neither the patient nor his wife acknowledged being told that palliative care and hospice were recommended due to his poor prognosis.  -I discussed the patient with gastroenterologist Dr. Oneida Alar. She had a discussion again with the patient, his wife, and other family members on 01/14/16. Her insight, recommendations are noted and greatly appreciated. In essence, she recommended DNR status and hospice and informed them that his life expectancy was less than 6 months.  -Family was in agreement to meet with palliative care, NP Ms. Hulan Fray. Apparently, neither the patient nor the family were willing to change his status to DO NOT RESUSCITATE and to pursue hospice. Therefore, the patient will receive home health services as ordered at the time of discharge.   DVT prophylaxis: SCDs Code Status: Full Family Communication: Discussed with wife and mother on 01/15/16. Disposition Plan: Discharge once improved.  Consultants:   Gastroenterology  Procedures:  2 units packed red blood cell transfusion on 8/18  1 unit pRBC transfusion 8/12  2 units pRBC transfusion 8/13  Mid line placed 8/12-d/c'd;  right IJ central line.  ECHO Study Conclusions - Left ventricle: The cavity size was normal. Wall thickness was   increased in a pattern of moderate LVH. Systolic function was   normal. The estimated  ejection fraction was in the range of 60%   to 65%. Diastolic function is abnormal, indeterminate grade. Wall   motion was normal; there were no regional wall motion   abnormalities. - Aortic valve: Valve area (VTI): 3.9 cm^2. Valve area (Vmax): 3.74   cm^2. Valve area (Vmean): 4.11 cm^2. - Left atrium: The atrium was mildly dilated. - Technically adequate study.  Antimicrobials:  None   Subjective: Patient denies chest pain and shortness of breath.   Objective: Vitals:   01/15/16 2200 01/16/16 0500 01/16/16 0931 01/16/16 1355  BP: (!) 102/49 (!) 103/55 (!) 113/56 (!) 110/54  Pulse: 64 (!) 58 70 65  Resp: 20 20  20   Temp: 98.1 F (36.7 C) 97.9 F (36.6 C)  98.1 F (36.7 C)  TempSrc: Oral Oral  Oral  SpO2: 97% 98%  100%  Weight:  (!) 182.4 kg (402 lb 3.2 oz)    Height:        Intake/Output Summary (Last 24 hours) at 01/16/16 1652 Last data filed at 01/16/16 1357  Gross per 24 hour  Intake             1680 ml  Output             5200 ml  Net            -3520 ml   Filed Weights   01/14/16 0500 01/15/16 0514 01/16/16 0500  Weight: (!) 176.5 kg (389 lb 1.8 oz) (!) 172.5 kg (380 lb 4.7 oz) (!) 182.4 kg (402 lb 3.2 oz)   Examination:  General exam: Appears calm and comfortable.   Neck: Right IJ central line noted with dressing-not blood tinged today. Respiratory system: Clear to auscultation. Respiratory effort normal. Cardiovascular system: S1 & S2 heard, RRR. No JVD, murmurs, rubs, gallops or clicks. 3-4+ pitting lower extremity/ pedal edema with anasarca. 2-3+ LUE edema and trace to 1+ RUE extremity edema. Gastrointestinal system: Abdomen is distended +/- acites, soft and nontender. No organomegaly or masses felt. Normal bowel sounds heard. Central nervous system: Alert and less lethargic. Cranial nerves II through XII are intact. No asterixis. Extremities: Symmetric 5 x 5 power. Skin: Brawny edema of his upper extremities and lower extremities; tattoo on upper  extremities. Psychiatry: Alert and oriented 3. Flat affect.   Data Reviewed: I have personally reviewed following labs and imaging studies  CBC:  Recent Labs Lab 01/12/16 0629 01/13/16 0645 01/14/16 0557 01/15/16 0645 01/16/16 0706  WBC 9.3 10.5 11.7* 10.3 9.0  HGB 6.9* 6.7* 8.0* 8.2* 8.5*  HCT 22.3* 20.8* 24.6* 25.1* 26.7*  MCV 94.9 91.7 92.5 91.9 94.0  PLT 98* 102* 100* 108* [id number]*   Basic Metabolic Panel:  Recent Labs Lab 01/11/16 0714 01/12/16 0629 01/13/16 0645 01/14/16 0557 01/15/16 0645 01/16/16 0706  NA 133* 132* 136 135 136 136  K 3.5 3.1* 3.6 3.8 3.7 3.8  CL 100* 99* 101 100* 101 101  CO2 26 27 25 27 27 29   GLUCOSE 114* 80 85 81 93 81  BUN 48* 47* 48* 47* 45* 43*  CREATININE 2.01* 1.70*  1.64* 1.66* 1.58* 1.53*  CALCIUM 8.5* 8.9 9.2 8.4* 8.5* 8.4*  PHOS 5.4*  --  4.5 4.8* 5.0* 5.1*   GFR: Estimated Creatinine Clearance: 107.7 mL/min (by C-G formula based on SCr of 1.53 mg/dL). Liver Function Tests:  Recent Labs Lab 01/12/16 0629 01/13/16 0645 01/14/16 0557 01/15/16 0645 01/16/16 0706  AST 69* 65*  --   --   --   ALT 32 30  --   --   --   ALKPHOS 111 106  --   --   --   BILITOT 4.0* 4.0*  --   --   --   PROT 6.7 6.6  --   --   --   ALBUMIN 3.0* 3.1*  3.1* 2.9* 2.8* 2.7*   Coagulation Profile:  Recent Labs Lab 01/11/16 0713 01/13/16 0645  INR 2.15 2.28   Urine analysis:    Component Value Date/Time   COLORURINE AMBER (A) 12/27/2015 2229   APPEARANCEUR CLEAR 12/27/2015 2229   LABSPEC 1.010 12/27/2015 2229   PHURINE 6.0 12/27/2015 2229   GLUCOSEU NEGATIVE 12/27/2015 2229   HGBUR LARGE (A) 12/27/2015 2229   BILIRUBINUR MODERATE (A) 12/27/2015 2229   KETONESUR NEGATIVE 12/27/2015 2229   PROTEINUR TRACE (A) 12/27/2015 2229   NITRITE NEGATIVE 12/27/2015 2229   LEUKOCYTESUR NEGATIVE 12/27/2015 2229   Scheduled Meds: . sodium chloride   Intravenous Once  . sodium chloride   Intravenous Once  . sodium chloride   Intravenous Once  .  folic acid  1 mg Oral Daily  . lactulose  20 g Oral TID  . magnesium oxide  400 mg Oral BID  . nadolol  40 mg Oral BID  . pantoprazole  40 mg Oral BID AC  . potassium chloride  40 mEq Oral BID  . sodium chloride flush  3 mL Intravenous Q12H  . spironolactone  50 mg Oral Daily  . thiamine  100 mg Oral Daily  . torsemide  40 mg Oral Daily   Continuous Infusions:    LOS: 12 days   Time spent: 25 minutes  Rexene Alberts, MD [facility]  If 7PM-7AM, please contact night-coverage [email or web address] Password [facility] 01/16/2016, 4:52 PM

## 2016-01-16 NOTE — Evaluation (Signed)
Physical Therapy Evaluation Patient Details Name: Antonio [name] MRN: [id number] DOB: [birth year] Today's Date: 01/16/2016   History of Present Illness  45 y.o. male with medical history significant of alcoholic cirrhosis of the liver, recent GIB requiring 4 units prbcs transfusion was discharged home in the last couple of days and reports since then he has progressively had more swelling in his legs with associated shortness of breath.  Denies any fevers or chest pain.  He says he is taking his medications.  He denies drinking, has not had any alcohol in about 3 weeks.  Pt referred for admission for volume overload, vitals are stable, oxygen sats are normal on RA.Marland Kitchen  PMH: ETOH abuse, alcoholic hepatitis, Cirrhosis with alcoholism   Clinical Impression  Pt received in bed, wife present, and pt is agreeable to PT evaluation.  Pt expressed that he still requires assistance for dressing and bathing from his wife at home, however he was independent with ambulation.  Pt ambulated 56ft with PT today.  At this point, he does not demonstrate need for skilled PT, however he would benefit from a bariatric w/c with elevating leg rests for longer distances.  PT will sign off at this time.     Follow Up Recommendations No PT follow up    Equipment Recommendations  Wheelchair (measurements PT);Wheelchair cushion (measurements PT) (bariatric w/c with elevating leg rests. )    Recommendations for Other Services       Precautions / Restrictions Precautions Precautions: None Restrictions Weight Bearing Restrictions: No      Mobility  Bed Mobility Overal bed mobility: Modified Independent                Transfers Overall transfer level: Needs assistance Equipment used: None Transfers: Sit to/from Stand (Min guard initially due to slight LOB, however pt reaching fwd and pulling on the wall.  Pt educated on pushing up from the bed instead. ) Sit to Stand: Min guard;Modified independent  (Device/Increase time)            Ambulation/Gait Ambulation/Gait assistance: Supervision Ambulation Distance (Feet): 60 Feet Assistive device: None       General Gait Details: increased lateral sway, and distance continues to be limited due to heavy amount of edema in B LE's.    Stairs            Wheelchair Mobility    Modified Rankin (Stroke Patients Only)       Balance                                             Pertinent Vitals/Pain Pain Assessment: No/denies pain    Home Living   Living Arrangements: Spouse/significant other (and dtr and 2 grandchildren. )   Type of Home: House (mother's house) Home Access: Stairs to enter   Technical brewer of Steps: 1 step Home Layout: One level Home Equipment: None      Prior Function     Gait / Transfers Assistance Needed: independent  ADL's / Homemaking Assistance Needed: Wife assists pt for 2-3 weeks to use the bathroom, & dressing        Hand Dominance        Extremity/Trunk Assessment   Upper Extremity Assessment: Overall WFL for tasks assessed           Lower Extremity Assessment: Overall WFL for tasks assessed  Communication   Communication: No difficulties  Cognition Arousal/Alertness: Awake/alert Behavior During Therapy: WFL for tasks assessed/performed Overall Cognitive Status: Within Functional Limits for tasks assessed                      General Comments General comments (skin integrity, edema, etc.): Heavy edema noted in B LE's.      Exercises        Assessment/Plan    PT Assessment Patent does not need any further PT services  PT Diagnosis Difficulty walking   PT Problem List    PT Treatment Interventions     PT Goals (Current goals can be found in the Care Plan section) Acute Rehab PT Goals PT Goal Formulation: All assessment and education complete, DC therapy    Frequency     Barriers to discharge         Co-evaluation               End of Session Equipment Utilized During Treatment: Gait belt Activity Tolerance: Patient tolerated treatment well Patient left: with family/visitor present (sitting on the EOB) Nurse Communication: Mobility status         Time: 1545-1600 PT Time Calculation (min) (ACUTE ONLY): 15 min   Charges:   PT Evaluation $PT Eval Low Complexity: 1 Procedure     PT G Codes:        Beth Neno [place], PT, DPT X: [id number]

## 2016-01-17 ENCOUNTER — Telehealth: Payer: Self-pay | Admitting: Gastroenterology

## 2016-01-17 LAB — CBC
HEMATOCRIT: 27.6 % — AB (ref 39.0–52.0)
HEMOGLOBIN: 8.8 g/dL — AB (ref 13.0–17.0)
MCH: 30 pg (ref 26.0–34.0)
MCHC: 31.9 g/dL (ref 30.0–36.0)
MCV: 94.2 fL (ref 78.0–100.0)
Platelets: 113 10*3/uL — ABNORMAL LOW (ref 150–400)
RBC: 2.93 MIL/uL — ABNORMAL LOW (ref 4.22–5.81)
RDW: 20.5 % — AB (ref 11.5–15.5)
WBC: 8.3 10*3/uL (ref 4.0–10.5)

## 2016-01-17 LAB — RENAL FUNCTION PANEL
ANION GAP: 6 (ref 5–15)
Albumin: 2.6 g/dL — ABNORMAL LOW (ref 3.5–5.0)
BUN: 40 mg/dL — AB (ref 6–20)
CHLORIDE: 103 mmol/L (ref 101–111)
CO2: 30 mmol/L (ref 22–32)
Calcium: 8.7 mg/dL — ABNORMAL LOW (ref 8.9–10.3)
Creatinine, Ser: 1.37 mg/dL — ABNORMAL HIGH (ref 0.61–1.24)
GFR calc Af Amer: >60 mL/min (ref >60)
GFR calc non Af Amer: >60 mL/min (ref >60)
GLUCOSE: 118 mg/dL — AB (ref 65–99)
POTASSIUM: 3.8 mmol/L (ref 3.5–5.1)
Phosphorus: 4.4 mg/dL (ref 2.5–4.6)
Sodium: 139 mmol/L (ref 135–145)

## 2016-01-17 MED ORDER — LACTULOSE 10 GM/15ML PO SOLN: 20.0000 g | Freq: Three times a day (TID) | ORAL | 3 refills | Status: DC

## 2016-01-17 MED ORDER — MAGNESIUM OXIDE 400 (241.3 MG) MG PO TABS: 400.0000 mg | ORAL_TABLET | Freq: Two times a day (BID) | ORAL | Status: DC

## 2016-01-17 MED ORDER — SPIRONOLACTONE 100 MG PO TABS: 50.0000 mg | ORAL_TABLET | Freq: Every day | ORAL | Status: DC

## 2016-01-17 MED ORDER — TORSEMIDE 20 MG PO TABS
20.0000 mg | ORAL_TABLET | Freq: Every day | ORAL | Status: DC
  Administered 2016-01-17: 20 mg via ORAL
  Filled 2016-01-17: qty 1

## 2016-01-17 MED ORDER — TORSEMIDE 20 MG PO TABS: 20.0000 mg | ORAL_TABLET | Freq: Every day | ORAL | 3 refills | Status: DC

## 2016-01-17 MED ORDER — POTASSIUM CHLORIDE CRYS ER 20 MEQ PO TBCR
20.0000 meq | EXTENDED_RELEASE_TABLET | Freq: Two times a day (BID) | ORAL | Status: DC
  Administered 2016-01-17: 20 meq via ORAL
  Filled 2016-01-17: qty 1

## 2016-01-17 NOTE — Care Management Note (Signed)
Case Management Note  Patient Details  Name: Maxson Soqui MRN: [id number] Date of Birth: 1971/03/15   Additional Comments: Patient will have to pay copay for each home health visit. Patient and wife notified and are agreeable. Patient will discharge home with home health provided by [facility].  Linnae Rasool, Chauncey Reading, RN 01/17/2016, 1:11 PM

## 2016-01-17 NOTE — Telephone Encounter (Signed)
Noted  

## 2016-01-17 NOTE — Progress Notes (Signed)
Daily Progress Note   Patient Name: Antonio [name]       Date: 01/17/2016 DOB: Sep 25, 1970  Age: 45 y.o. MRN#: [id number] Attending Physician: Rexene Alberts, MD Primary Care Physician: Deloria Lair, MD Admit Date: 01/03/2016  Reason for Consultation/Follow-up: Disposition, Establishing goals of care, Hospice Evaluation and Psychosocial/spiritual support  Subjective: Antonio [name] is resting quietly in bed, he seems to be sleeping but opens his eyes and greets me as I enter. He keeps his eyes closed through most of our conversation. His wife is at bedside today. They both denied questions at this time. We talk about home health services versus hospice. We talk about the costs related to each. We also talk about medication management.  Length of Stay: 13  Current Medications: Scheduled Meds:  . sodium chloride   Intravenous Once  . sodium chloride   Intravenous Once  . sodium chloride   Intravenous Once  . folic acid  1 mg Oral Daily  . lactulose  20 g Oral TID  . magnesium oxide  400 mg Oral BID  . nadolol  40 mg Oral BID  . pantoprazole  40 mg Oral BID AC  . potassium chloride  20 mEq Oral BID  . sodium chloride flush  3 mL Intravenous Q12H  . spironolactone  50 mg Oral Daily  . thiamine  100 mg Oral Daily  . torsemide  20 mg Oral Daily    Continuous Infusions:    PRN Meds: sodium chloride, lactulose, oxyCODONE, sodium chloride flush  Physical Exam  Constitutional: No distress.  Looks older than stated age, chronically ill appearing, morbidly obese.  HENT:  Head: Normocephalic and atraumatic.  Cardiovascular: Normal rate and regular rhythm.   Pulmonary/Chest: Effort normal. No respiratory distress.  Abdominal: Soft.  Morbidly obese  Musculoskeletal: He exhibits edema.    Neurological:  Sleepy  Skin: Skin is warm and dry.  Multiple areas of bruising bilateral upper extremities, anasarca.  Nursing note and vitals reviewed.           Vital Signs: BP (!) 111/45 (BP Location: Left Arm)   Pulse 66   Temp 97.8 F (36.6 C) (Oral)   Resp (!) 23   Ht 6\' 2"  (1.88 m)   Wt (!) 176.7 kg (389 lb 9.6 oz)   SpO2 100%   BMI 50.02  kg/m  SpO2: SpO2: 100 % O2 Device: O2 Device: Not Delivered O2 Flow Rate:    Intake/output summary:  Intake/Output Summary (Last 24 hours) at 01/17/16 1450 Last data filed at 01/17/16 0900  Gross per 24 hour  Intake              840 ml  Output             5475 ml  Net            -4635 ml   LBM: Last BM Date: 01/16/16 Baseline Weight: Weight: (!) 161.9 kg (357 lb) Most recent weight: Weight: (!) 176.7 kg (389 lb 9.6 oz)       Palliative Assessment/Data:    Flowsheet Rows   Flowsheet Row Most Recent Value  Intake Tab  Referral Department  Hospitalist  Unit at Time of Referral  Med/Surg Unit  Palliative Care Primary Diagnosis  Cancer  Date Notified  01/14/16  Palliative Care Type  New Palliative care  Reason for referral  Clarify Goals of Care, Counsel Regarding Hospice  Date of Admission  01/03/16  Date first seen by Palliative Care  01/16/16  # of days Palliative referral response time  2 Day(s)  # of days IP prior to Palliative referral  11  Clinical Assessment  Palliative Performance Scale Score  40%  Pain Max last 24 hours  Not able to report  Pain Min Last 24 hours  Not able to report  Dyspnea Max Last 24 Hours  Not able to report  Dyspnea Min Last 24 hours  Not able to report  Psychosocial & Spiritual Assessment  Palliative Care Outcomes  Patient/Family meeting held?  Yes  Who was at the meeting?  patient, wife, mother and brother.   Palliative Care Outcomes  Counseled regarding hospice, Provided advance care planning, Clarified goals of care, Provided psychosocial or spiritual support  Palliative Care  follow-up planned  -- [follow up while at [facility]]      Patient Active Problem List   Diagnosis Date Noted  . Palliative care encounter   . DNR (do not resuscitate) discussion   . Goals of care, counseling/discussion   . Edema of upper extremity   . Bilateral edema of lower extremity   . Coagulopathy ([place]) 01/11/2016  . Encephalopathy, hepatic ([place]) 01/10/2016  . Acute renal failure ([place]) 01/10/2016  . Thrombocytopenia ([place]) 01/08/2016  . Anasarca 01/04/2016  . DOE (dyspnea on exertion)   . Peripheral edema   . Anemia, unspecified 12/27/2015  . Alcohol abuse 12/27/2015  . Decompensation of cirrhosis of liver ([place]) 12/27/2015  . Alcoholic cirrhosis of liver with ascites [facility])     Palliative Care Assessment & Plan   Patient Profile: 45 y.o. male  with past medical history of alcoholic hepatitis and cirrhosis, alcoholic abuse admitted on 01/03/2016 with volume overload.   Assessment: As above  Recommendations/Plan:  patient and family elect to go home with the benefit of home health. We discuss home with hospice. Patient and family are not ready for hospice at this time.  Goals of Care and Additional Recommendations:  Limitations on Scope of Treatment: Full Scope Treatment  Code Status:    Code Status Orders        Start     Ordered   01/04/16 0218  Full code  Continuous     01/04/16 0217    Code Status History    Date Active Date Inactive Code Status Order ID Comments User Context   12/27/2015  2:41 AM 12/31/2015  6:43 PM Full Code [id number]  Oswald Hillock, MD Inpatient       Prognosis:   < 6 months, likely based on chronic illness disease burden, functional decline.  Discharge Planning:  Home with [place] was discussed with nursing staff, case manager, social worker, and Dr. Caryn Section.  Thank you for allowing the Palliative Medicine Team to assist in the care of this patient.   Time In: 1045 Time Out: 1110 Total Time 25 minutes  Prolonged Time  Billed  no       Greater than 50%  of this time was spent counseling and coordinating care related to the above assessment and plan.  Drue Novel, NP  Please contact Palliative Medicine Team phone at [phone] for questions and concerns.

## 2016-01-17 NOTE — Progress Notes (Signed)
RT IJ removed without difficulty, vaseline gauze and 2x2's applied, pressure held x 2 minutes, secured with tape, instructed to leave dressing in place until tomorrow, verbalized understanding.

## 2016-01-17 NOTE — Progress Notes (Signed)
REVIEWED-NO ADDITIONAL RECOMMENDATIONS.   Subjective: No abdominal pain, N/V, rectal bleeding, confusion. Wants to go home.   Objective: Vital signs in last 24 hours: Temp:  [97.7 F (36.5 C)-98.1 F (36.7 C)] 97.8 F (36.6 C) (08/22 0658) Pulse Rate:  [65-70] 66 (08/22 0658) Resp:  [20-23] 23 (08/22 0658) BP: (110-113)/(45-61) 111/45 (08/22 0658) SpO2:  [100 %] 100 % (08/22 0658) Weight:  [389 lb 9.6 oz (176.7 kg)] 389 lb 9.6 oz (176.7 kg) (08/22 0658) Last BM Date: 01/14/16 General:   Alert and oriented, pleasant Head:  Normocephalic and atraumatic. Abdomen:  Bowel sounds present, soft, non-tender, obese, multiple striae.  Extremities:  Improving pedal edema, 3+ pitting edema lower extremities  Neurologic:  Alert and  oriented x4;  Negative asterixis  Psych:  Alert and cooperative. Normal mood and affect.  Intake/Output from previous day: 08/21 0701 - 08/22 0700 In: 960 [P.O.:960] Out: 7775 [Urine:7775] Intake/Output this shift: Total I/O In: -  Out: 300 [Urine:300]  Lab Results:  Recent Labs  01/15/16 0645 01/16/16 0706  WBC 10.3 9.0  HGB 8.2* 8.5*  HCT 25.1* 26.7*  PLT 108* 111*   BMET  Recent Labs  01/15/16 0645 01/16/16 0706  NA 136 136  K 3.7 3.8  CL 101 101  CO2 27 29  GLUCOSE 93 81  BUN 45* 43*  CREATININE 1.58* 1.53*  CALCIUM 8.5* 8.4*   LFT  Recent Labs  01/15/16 0645 01/16/16 0706  ALBUMIN 2.8* 2.7*   Studies/Results: US Venous Img Upper Uni Left  Result Date: 01/15/2016 CLINICAL DATA:  Left upper extremity pain and edema. EXAM: LEFT UPPER EXTREMITY VENOUS DOPPLER ULTRASOUND TECHNIQUE: Gray-scale sonography with graded compression, as well as color Doppler and duplex ultrasound were performed to evaluate the upper extremity deep venous system from the level of the subclavian vein and including the jugular, axillary, basilic, radial, ulnar and upper cephalic vein. Spectral Doppler was utilized to evaluate flow at rest and with distal  augmentation maneuvers. COMPARISON:  None. FINDINGS: Contralateral Subclavian Vein: Respiratory phasicity is normal and symmetric with the symptomatic side. No evidence of thrombus. Normal compressibility. Internal Jugular Vein: No evidence of thrombus. Normal compressibility, respiratory phasicity and response to augmentation. Subclavian Vein: No evidence of thrombus. Normal compressibility, respiratory phasicity and response to augmentation. Axillary Vein: No evidence of thrombus. Normal compressibility, respiratory phasicity and response to augmentation. Cephalic Vein: No evidence of thrombus. Normal compressibility, respiratory phasicity and response to augmentation. Basilic Vein: No evidence of thrombus. Normal compressibility, respiratory phasicity and response to augmentation. Brachial Veins: No evidence of thrombus. Normal compressibility, respiratory phasicity and response to augmentation. Radial Veins: No evidence of thrombus. Normal compressibility, respiratory phasicity and response to augmentation. Ulnar Veins: No evidence of thrombus. Normal compressibility, respiratory phasicity and response to augmentation. Venous Reflux:  None visualized. Other Findings: No evidence of superficial thrombophlebitis or abnormal fluid collection. IMPRESSION: No evidence of left upper extremity deep venous thrombosis. Electronically Signed   By: Aletta Edouard M.D.   On: 01/15/2016 15:38    Assessment: 45 year old with ETOH cirrhosis, history of ETOH hepatitis, unable to exclude underlying NASH cirrhosis, admitted with decompensation and limited options from a GI standpoint. Palliative consult reviewed and appreciated. Appreciate Nephrology consultation and continued guidance with diuresis. Limited options and overall prognosis poor with high mortality rate and likely less than 6 months' life expectancy. Not a transplant candidate due to alcohol intake. Recommend home hospice, palliative care. Will follow  peripherally for now. Likely discharge in the near future.  Plan: Continue lactulose and Xifaxan Protonix BID Continue diuresis as per nephrology Hopeful discharge in near future Will follow peripherally  Annitta Needs, ANP-BC [facility]       LOS: 13 days    01/17/2016, 7:44 AM

## 2016-01-17 NOTE — Progress Notes (Signed)
Antonio [name] discharged Home per MD order.  Discharge instructions reviewed and discussed with the patient, all questions and concerns answered. Copy of instructions and scripts given to patient.    Medication List    STOP taking these medications   prednisoLONE 15 MG/5ML solution Commonly known as:  ORAPRED     TAKE these medications   CALCIUM 600+D 600-800 MG-UNIT Tabs Generic drug:  Calcium Carb-Cholecalciferol Take 1 tablet by mouth daily.   folic acid 1 MG tablet Commonly known as:  FOLVITE Take 1 tablet (1 mg total) by mouth daily.   lactulose 10 GM/15ML solution Commonly known as:  CHRONULAC Take 30 mLs (20 g total) by mouth 3 (three) times daily. What changed:  how much to take  when to take this  reasons to take this   magnesium oxide 400 (241.3 Mg) MG tablet Commonly known as:  MAG-OX Take 1 tablet (400 mg total) by mouth 2 (two) times daily.   nadolol 40 MG tablet Commonly known as:  CORGARD Take 1 tablet (40 mg total) by mouth 2 (two) times daily.   pantoprazole 40 MG tablet Commonly known as:  PROTONIX Take 1 tablet (40 mg total) by mouth 2 (two) times daily before a meal.   potassium chloride SA 20 MEQ tablet Commonly known as:  K-DUR,KLOR-CON Take 20 mEq by mouth 2 (two) times daily.   rifaximin 550 MG Tabs tablet Commonly known as:  XIFAXAN Take 1 tablet (550 mg total) by mouth 2 (two) times daily.   spironolactone 100 MG tablet Commonly known as:  ALDACTONE Take 0.5 tablets (50 mg total) by mouth daily. What changed:  how much to take   thiamine 100 MG tablet Take 1 tablet (100 mg total) by mouth daily.   torsemide 20 MG tablet Commonly known as:  DEMADEX Take 1 tablet (20 mg total) by mouth daily. For treatment of swelling.       Patients skin is clean, dry and intact, no evidence of skin break down.   Patient escorted to car by NT in a wheelchair,  no distress noted upon discharge.  Antonio [name] 01/17/2016 3:24 PM

## 2016-01-17 NOTE — Progress Notes (Signed)
Subjective: Interval History: Patient feels good and denies any difficulty breathing. Presently he denies also any nausea or vomiting.  Objective: Vital signs in last 24 hours: Temp:  [97.7 F (36.5 C)-98.1 F (36.7 C)] 97.8 F (36.6 C) (08/22 0658) Pulse Rate:  [65-70] 66 (08/22 0658) Resp:  [20-23] 23 (08/22 0658) BP: (110-113)/(45-61) 111/45 (08/22 0658) SpO2:  [100 %] 100 % (08/22 0658) Weight:  [176.7 kg (389 lb 9.6 oz)] 176.7 kg (389 lb 9.6 oz) (08/22 0658) Weight change: -5.715 kg (-12 lb 9.6 oz)  Intake/Output from previous day: 08/21 0701 - 08/22 0700 In: 960 [P.O.:960] Out: 7775 [Urine:7775] Intake/Output this shift: Total I/O In: -  Out: 300 [Urine:300]  General appearance: alert, cooperative and no distress Resp: diminished breath sounds bilaterally Cardio: regular rate and rhythm, S1, S2 normal, no murmur, click, rub or gallop Extremities: edema He has 3+ edema bilaterally  Lab Results:  Recent Labs  01/15/16 0645 01/16/16 0706  WBC 10.3 9.0  HGB 8.2* 8.5*  HCT 25.1* 26.7*  PLT 108* 111*   BMET:   Recent Labs  01/15/16 0645 01/16/16 0706  NA 136 136  K 3.7 3.8  CL 101 101  CO2 27 29  GLUCOSE 93 81  BUN 45* 43*  CREATININE 1.58* 1.53*  CALCIUM 8.5* 8.4*   No results for input(s): PTH in the last 72 hours. Iron Studies: No results for input(s): IRON, TIBC, TRANSFERRIN, FERRITIN in the last 72 hours.  Studies/Results: US Venous Img Upper Uni Left  Result Date: 01/15/2016 CLINICAL DATA:  Left upper extremity pain and edema. EXAM: LEFT UPPER EXTREMITY VENOUS DOPPLER ULTRASOUND TECHNIQUE: Gray-scale sonography with graded compression, as well as color Doppler and duplex ultrasound were performed to evaluate the upper extremity deep venous system from the level of the subclavian vein and including the jugular, axillary, basilic, radial, ulnar and upper cephalic vein. Spectral Doppler was utilized to evaluate flow at rest and with distal augmentation  maneuvers. COMPARISON:  None. FINDINGS: Contralateral Subclavian Vein: Respiratory phasicity is normal and symmetric with the symptomatic side. No evidence of thrombus. Normal compressibility. Internal Jugular Vein: No evidence of thrombus. Normal compressibility, respiratory phasicity and response to augmentation. Subclavian Vein: No evidence of thrombus. Normal compressibility, respiratory phasicity and response to augmentation. Axillary Vein: No evidence of thrombus. Normal compressibility, respiratory phasicity and response to augmentation. Cephalic Vein: No evidence of thrombus. Normal compressibility, respiratory phasicity and response to augmentation. Basilic Vein: No evidence of thrombus. Normal compressibility, respiratory phasicity and response to augmentation. Brachial Veins: No evidence of thrombus. Normal compressibility, respiratory phasicity and response to augmentation. Radial Veins: No evidence of thrombus. Normal compressibility, respiratory phasicity and response to augmentation. Ulnar Veins: No evidence of thrombus. Normal compressibility, respiratory phasicity and response to augmentation. Venous Reflux:  None visualized. Other Findings: No evidence of superficial thrombophlebitis or abnormal fluid collection. IMPRESSION: No evidence of left upper extremity deep venous thrombosis. Electronically Signed   By: Aletta Edouard M.D.   On: 01/15/2016 15:38    I have reviewed the patient's current medications.  Assessment/Plan: Problem #1acute renal failure: Most likely prerenal versus ATN. His renal function is stable. Patient is asymptomatic. Problem #2 anasarca: Patient on by mouth Demadex 40 mg once a day and Aldactone 50 mg once a day. Patient had 7700 mL of urine output. His leg edema seems to be improving but presently still patient with significant sign of fluid overload. Problem #3 hypokalemia: Most likely secondary to diuretics. Patient on potassium supplementation and Aldactone.  His  potassium remains normal. Problem #4 anemia: Patient has received blood transfusion and his hemoglobin has remained stable. Problem #5 hyponatremia: Hypervolemic hyponatremia; his sodium has corrected.  Problem #6 liver cirrhosis  Problem#7 Obesity  plan: 1] We'll decrease Demadex to 20 mg by mouth daily           2] we'll decrease potassium to 20 mEq by mouth once a day.            3] if patient is going to be discharged he needed to continue taking Demadex and metolazone.            4] we'll follow patient in 4 weeks           5] We'll check his renal panel in the morning  LOS: 13 days   Evalisse Prajapati S 01/17/2016,7:44 AM

## 2016-01-17 NOTE — Telephone Encounter (Signed)
FMLA papers were faxed to [place] today on patient. He has OV with SF on 02/08/2016 @ 2pm. I placed the papers on SF office chair.

## 2016-01-17 NOTE — Discharge Summary (Signed)
Physician Discharge Summary  Antonio [name] [id number] DOB: [birth year] DOA: 01/03/2016  PCP: Deloria Lair, MD  Admit date: 01/03/2016 Discharge date: 01/17/2016  Time spent:> than 30 minutes  Recommendations for Outpatient Follow-up:  1. Patient was instructed to follow up with gastroenterology in 2-4 weeks and with his PCP in 1-2 weeks. 2. Recommend follow up of his CBC an renal function. 3. Patient declined Hospice   Discharge Diagnoses:  Principal Problem:   Decompensation of cirrhosis of liver (HCC) Active Problems:   Alcoholic cirrhosis of liver with ascites (HCC)   Anasarca   Thrombocytopenia (HCC)   Coagulopathy (HCC)   Anemia, unspecified   Alcohol abuse   DOE (dyspnea on exertion)   Peripheral edema   Encephalopathy, hepatic (HCC)   Acute renal failure (HCC)   Bilateral edema of lower extremity   Probable nonocclusive thrombus right upper extremity, DVT versus superficial thrombophlebitis.   Morbid obesity.   Palliative care encounter    Discharge Condition: Stable  Diet recommendation:Low sodium   Filed Weights   01/15/16 0514 01/16/16 0500 01/17/16 0658  Weight: (!) 172.5 kg (380 lb 4.7 oz) (!) 182.4 kg (402 lb 3.2 oz) (!) 176.7 kg (389 lb 9.6 oz)    History of present illness:  Patient is a 45 yom with hx of alcoholic cirrhosis of liver, alcohol abuse, GERD, morbid obesity, and anemia of chronic disease, who was recently discharged  for a GI bleed after receiving 2 units of PRBCs. He presented with complaints of progressively worsened swelling in his legs and SOB. While in the ED, he was noted to have volume overload. Pt was readmitted for further management of his swelling and SOB.  Hospital Course:  Decompensation of alcoholic cirrhosis of liver. Patient was restarted on nadolol and rifaximin. GI was consulted and continued to follow the patient and make management recommendations. Due to massive volume overload and a low albumin of 2 or less, he was  started on IV Lasix and albumin infusions. He was continued on Aldactone. His fluid intake was restricted. He was given a low sodium diet. Eventually metolazone was added but was discontinued. -Per GI, his AFP was normal. Ultrasound of his abdomen with Doppler on 8/16 revealed evidence of known cirrhosis, patent portal vein with normal hepatopedal flow; mild cholelithiasis but no sonographic evidence to suggest cholecystitis. -Per GI, prednisolone was started and continued for a few days before it was discontinued. -As of 8/18, his MELD (20%-3 month mortality).  -Per discussion with Dr. Oneida Alar, his life expectancy was less than 6 months and he was not a transplant candidate because he had been drinking alcohol up until 3-4 weeks ago. She discussed DNR status and home health hospice with the patient and family. Palliative care was consulted. Following their conversation, neither the patient nor his family was ready for DO NOT RESUSCITATE status and hospice.  Acute renal failure, secondary to ATN versus prerenal AKI. Patient's creatinine was 1.03 on admission. It  started trending up to a high of 2.0. Nephrology was consulted to assist with diuresis. -Dr. Lowanda Foster increased the IV Lasix to 160 mg IV twice a day; increased Aldactone to 50 mg daily. -The regimen was eventually  changed to torsemide 20mg  daily and Aldactone 50 mg daily. - His creatinine improved to 1.37  Hepatic encephalopathy. Patient's ammonia level was noted to be elevated. He was lethargic. He was started on lactulose. He was continued on rifaximin. -Follow-up ammonia level improved from 140 to 72. He became more alert.  Alcohol  abuse.  Patient had been alcohol free for 3 weeks prior to admission..There were no signs of alcohol withdrawal at this time. Thiamine was continued.   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 His hemoglobin was 7.3 on admission and appears to range from 6-8 g. Anemia panel indicated iron deficiency. He was transfused 3 units pRBC early in the hospital course with improvement in his Hb by only 1 gram. Hemolytic process was a concern. - Hematology consulted. Their assessment noted and appreciated. The patient was given 1 dose of Feraheme on 01/10/16. Per hematology, it could be repeated in 3-8 days if necessary. There may have been an element of hemolysis minimal to mild given his mildly elevated LDH and mildly low haptoglobin and elevated indirect bilirubin. DAT was negative. G6PD slightly elevated-significance not clear. Pathology smear ordered for hematology to review. -GI noted that the patient had no overt GI bleeding but he likely had some lower GI tract oozing/chronic blood loss in the setting of known coagulopathy. He was not a candidate for colonoscopy.  -Patient also had intermittent oozing from his central line. - His Hb trended down again to  6.7. He was transfused another 2 units for total of 5 units in all. His hemoglobin improved to 8.0>>8.8. I informed the patient that ongoing transfusions are doing little to improve his prognosis and I did not favor transfusing him any more during the hospital cousre. This was discussed with Dr. Oneida Alar who was in agreement.  Severe coagulopathy, secondary to cirrhosis. Patient's baseline INR is 2-2.46. It was 2.10 on admission. Subcutaneous vitamin K was given 3 days. -His INR did not improve and remained above 2.  Thrombocytopenia.  Patient's baseline platelet count ranges from 93-105. It was 103 on admission. It remained at baseline. 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   Possible DVT in RUE.  Patient's RUE Mid line infiltrated resulting in swelling. Venous dopplers could not rule out DVT. Regardless, with his significant anemia and underlying coagulopathy with INR  of 2, he was not a candidate for anticoagulation. Hematology/oncology agreed.  Left upper extremity edema. -Left upper extremity Doppler was negative for DVT. Edema was likely from overall anasarca.  Hypokalemia. With IV Lasix, the patient's serum potassium decreased. Oral potassium chloride supplementation was started. His serum potassium improved.  Discussion: Patient's cirrhosis appears to be end-stage. GI, Dr. Oneida Alar had recommended palliative care and hospice to the patient during a previous hospitalization. Oncology also recommended a palliative care consult and their notes. Previous hospitalist, Dr. Roderic Palau had a long discussion with the patient and his wife and they were in agreement to a palliative care consult if he continued to decline. On 01/13/16, I approached the patient and his wife for a discussion about their understanding of his prognosis and what they understood the other doctors were telling them about his liver. The patient did not acknowledge that he was told that his prognosis was poor and that palliative care and subsequently hospice was recommended. His wife did acknowledge that she was told that his liver was such that there was no cure other than a liver transplant. However neither the patient nor his wife acknowledged being told that palliative care and hospice were recommended due to his poor prognosis. -I discussed the patient with gastroenterologist Dr. Oneida Alar. She had a discussion again with the patient, his wife, and other family members on  01/14/16. Her insight, recommendations were noted and greatly appreciated. In essence, she recommended DNR status and hospice and informed them that his life expectancy was less than 6 months. -Family was in agreement to meet with palliative care, NP Ms. Hulan Fray. Apparently, neither the patient nor the family were willing to change his status to DO NOT RESUSCITATE and to pursue hospice. He remained a full code.    Procedures:  2 units  packed red blood cell transfusion on 8/18  1 unit pRBC transfusion 8/12  2 units pRBC transfusion 8/13  Mid line placed 8/12-d/c'd;  right IJ central line>> discontinued at discharge.  ECHO Study Conclusions - Left ventricle: The cavity size was normal. Wall thickness was increased in a pattern of moderate LVH. Systolic function was normal. The estimated ejection fraction was in the range of 60% to 65%. Diastolic function is abnormal, indeterminate grade. Wall motion was normal; there were no regional wall motion abnormalities. - Aortic valve: Valve area (VTI): 3.9 cm^2. Valve area (Vmax): 3.74 cm^2. Valve area (Vmean): 4.11 cm^2. - Left atrium: The atrium was mildly dilated. - Technically adequate study.   Consultations:  Gastroenterology  Nephrology  Discharge Exam: Vitals:   01/16/16 2057 01/17/16 0658  BP: 112/61 (!) 111/45  Pulse: 69 66  Resp: (!) 22 (!) 23  Temp: 97.7 F (36.5 C) 97.8 F (36.6 C)   General exam: Appears calm and comfortable.   Neck: Right IJ central line noted with dressing-not blood tinged today. Respiratory system: Clear to auscultation. Respiratory effort normal. Cardiovascular system: S1 & S2 heard, RRR. No JVD, murmurs, rubs, gallops or clicks. 3-4+ pitting lower extremity/ pedal edema with anasarca. 2-3+ LUE edema and trace to 1+ RUE extremity edema. Gastrointestinal system: Abdomen is distended +/- acites, soft and nontender. No organomegaly or masses felt. Normal bowel sounds heard. Central nervous system: Alert, Without lethargy. Cranial nerves II through XII are intact. No asterixis. Skin: Brawny edema of his upper extremities and lower extremities; tattoo on upper extremities.   Discharge Instructions   Discharge Instructions    Diet - low sodium heart healthy    Complete by:  As directed   Increase activity slowly    Complete by:  As directed     Current Discharge Medication List    START taking these medications    Details  magnesium oxide (MAG-OX) 400 (241.3 Mg) MG tablet Take 1 tablet (400 mg total) by mouth 2 (two) times daily.    torsemide (DEMADEX) 20 MG tablet Take 1 tablet (20 mg total) by mouth daily. For treatment of swelling. Qty: 30 tablet, Refills: 3      CONTINUE these medications which have CHANGED   Details  lactulose (CHRONULAC) 10 GM/15ML solution Take 30 mLs (20 g total) by mouth 3 (three) times daily. Qty: 240 mL, Refills: 3    spironolactone (ALDACTONE) 100 MG tablet Take 0.5 tablets (50 mg total) by mouth daily.      CONTINUE these medications which have NOT CHANGED   Details  Calcium Carb-Cholecalciferol (CALCIUM 600+D) 600-800 MG-UNIT TABS Take 1 tablet by mouth daily.    folic acid (FOLVITE) 1 MG tablet Take 1 tablet (1 mg total) by mouth daily. Qty: 30 tablet, Refills: 1    nadolol (CORGARD) 40 MG tablet Take 1 tablet (40 mg total) by mouth 2 (two) times daily. Qty: 60 tablet, Refills: 2    pantoprazole (PROTONIX) 40 MG tablet Take 1 tablet (40 mg total) by mouth 2 (two) times daily before a  meal. Qty: 30 tablet, Refills: 1    potassium chloride SA (K-DUR,KLOR-CON) 20 MEQ tablet Take 20 mEq by mouth 2 (two) times daily.    thiamine 100 MG tablet Take 1 tablet (100 mg total) by mouth daily. Qty: 30 tablet, Refills: 1    rifaximin (XIFAXAN) 550 MG TABS tablet Take 1 tablet (550 mg total) by mouth 2 (two) times daily. Qty: 42 tablet, Refills: 1      STOP taking these medications     prednisoLONE (ORAPRED) 15 MG/5ML solution        No Known Allergies Follow-up Information    [facility], MD Follow up in 4 week(s).   Specialty:  Nephrology Contact information: [address] [place] [address] [phone]            The results of significant diagnostics from this hospitalization (including imaging, microbiology, ancillary and laboratory) are listed below for reference.    Significant Diagnostic Studies: Dg Chest 2  View  Result Date: 01/03/2016 CLINICAL DATA:  45 y/o male here with c/o SOB, cough x2 days. Leg edema also noted by pt. Denies hx asthma, COPD, MI, HTN, DM, etc etc. Non-smoker EXAM: CHEST  2 VIEW COMPARISON:  12/26/2015 FINDINGS: Heart size is upper normal. There are no focal consolidations or pleural effusions. No pulmonary edema. Visualized osseous structures have a normal appearance. IMPRESSION: No evidence for acute cardiopulmonary abnormality. Electronically Signed   By: Nolon Nations M.D.   On: 01/03/2016 22:03   Dg Chest 2 View  Result Date: 12/27/2015 CLINICAL DATA:  Intermittent shortness of breath, bilateral lower extremity swelling for 1-1/2 weeks. History of liver disease. EXAM: CHEST  2 VIEW COMPARISON:  None. FINDINGS: Cardiomediastinal silhouette is unremarkable for this low inspiratory examination with crowded vasculature markings. The lungs are clear without pleural effusions or focal consolidations. Trachea projects midline and there is no pneumothorax. Included soft tissue planes and osseous structures are non-suspicious. Abdominal distention noted on the lateral radiograph. IMPRESSION: No active cardiopulmonary disease. Electronically Signed   By: Elon Alas M.D.   On: 12/27/2015 00:07   US Venous Img Upper Uni Left  Result Date: 01/15/2016 CLINICAL DATA:  Left upper extremity pain and edema. EXAM: LEFT UPPER EXTREMITY VENOUS DOPPLER ULTRASOUND TECHNIQUE: Gray-scale sonography with graded compression, as well as color Doppler and duplex ultrasound were performed to evaluate the upper extremity deep venous system from the level of the subclavian vein and including the jugular, axillary, basilic, radial, ulnar and upper cephalic vein. Spectral Doppler was utilized to evaluate flow at rest and with distal augmentation maneuvers. COMPARISON:  None. FINDINGS: Contralateral Subclavian Vein: Respiratory phasicity is normal and symmetric with the symptomatic side. No evidence of  thrombus. Normal compressibility. Internal Jugular Vein: No evidence of thrombus. Normal compressibility, respiratory phasicity and response to augmentation. Subclavian Vein: No evidence of thrombus. Normal compressibility, respiratory phasicity and response to augmentation. Axillary Vein: No evidence of thrombus. Normal compressibility, respiratory phasicity and response to augmentation. Cephalic Vein: No evidence of thrombus. Normal compressibility, respiratory phasicity and response to augmentation. Basilic Vein: No evidence of thrombus. Normal compressibility, respiratory phasicity and response to augmentation. Brachial Veins: No evidence of thrombus. Normal compressibility, respiratory phasicity and response to augmentation. Radial Veins: No evidence of thrombus. Normal compressibility, respiratory phasicity and response to augmentation. Ulnar Veins: No evidence of thrombus. Normal compressibility, respiratory phasicity and response to augmentation. Venous Reflux:  None visualized. Other Findings: No evidence of superficial thrombophlebitis or abnormal fluid collection. IMPRESSION: No evidence of  left upper extremity deep venous thrombosis. Electronically Signed   By: Aletta Edouard M.D.   On: 01/15/2016 15:38   US Venous Img Upper Uni Right  Result Date: 01/09/2016 CLINICAL DATA:  Right upper extremity edema. EXAM: RIGHT UPPER EXTREMITY VENOUS DOPPLER ULTRASOUND TECHNIQUE: Gray-scale sonography with graded compression, as well as color Doppler and duplex ultrasound were performed to evaluate the upper extremity deep venous system from the level of the subclavian vein and including the jugular, axillary, basilic, radial, ulnar and upper cephalic vein. Spectral Doppler was utilized to evaluate flow at rest and with distal augmentation maneuvers. COMPARISON:  None. FINDINGS: Contralateral Subclavian Vein: Respiratory phasicity is normal and symmetric with the symptomatic side. No evidence of thrombus. Normal  compressibility. Internal Jugular Vein: No evidence of thrombus. Normal compressibility, respiratory phasicity and response to augmentation. Subclavian Vein: No evidence of thrombus. Normal compressibility, respiratory phasicity and response to augmentation. Axillary Vein: No evidence of thrombus. Normal compressibility, respiratory phasicity and response to augmentation. Cephalic Vein: No evidence of thrombus. Normal compressibility, respiratory phasicity and response to augmentation. Basilic Vein: No evidence of thrombus. Normal compressibility, respiratory phasicity and response to augmentation. Brachial Veins: No evidence of thrombus. Normal compressibility, respiratory phasicity and response to augmentation. Radial Veins: No evidence of thrombus. Normal compressibility, respiratory phasicity and response to augmentation. Ulnar Veins: No evidence of thrombus. Normal compressibility, respiratory phasicity and response to augmentation. Venous Reflux:  None visualized. Other Findings: Nonocclusive thrombus is identified in a segment of a vein adjacent to the axillary artery and vein. This either represents a duplicated axillary segment or potentially a large basilic vein extending up into the axilla. This is not the normal position of the cephalic vein. IMPRESSION: Nonocclusive thrombus identified in a segment of vein adjacent to the axillary artery and vein. This either represents DVT in a duplicated axillary vein or potentially superficial thrombophlebitis of an extended basilic vein. Electronically Signed   By: Aletta Edouard M.D.   On: 01/09/2016 12:03   [place] Art/ven Flow Abd Pelv Doppler Limited  Result Date: 01/10/2016 CLINICAL DATA:  Cirrhosis with questionable portal vein thrombosis. EXAM: US ABDOMEN LIMITED - RIGHT UPPER QUADRANT COMPARISON:  None. FINDINGS: Exam somewhat difficult due to patient body habitus. Gallbladder: Mild cholelithiasis with a 1.1 cm stone over the region of the gallbladder neck.  Borderline wall thickening measuring 3.4 mm. No pericholecystic fluid. Negative sonographic Murphy sign. Common bile duct: Diameter: 3.0 mm. Liver: Somewhat heterogeneous echotexture with nodular contour compatible with known cirrhosis. No focal mass. Portal vein is patent with normal hepatopetal flow and normal Doppler waveform and velocity of 27 cm/sec. IMPRESSION: Evidence of known cirrhosis. Patent portal vein with normal hepatopetal flow and normal velocity of 27 cm/sec. Mild cholelithiasis with 1.1 cm stone over the region of the gallbladder neck. No additional sonographic evidence to suggest cholecystitis. Electronically Signed   By: Marin Olp M.D.   On: 01/10/2016 08:48   Dg Chest Port 1 View  Result Date: 01/09/2016 CLINICAL DATA:  Central catheter placement EXAM: PORTABLE CHEST 1 VIEW COMPARISON:  January 03, 2016 FINDINGS: Central catheter tip is in the superior vena cava. Pneumothorax. A small portion of the lateral left base is not visualized. The visualized lungs show no edema or consolidation. Heart is mildly enlarged with pulmonary vascularity within normal limits. No adenopathy. No bone lesions. IMPRESSION: Visualized lung regions are clear. No pneumothorax. Stable cardiomegaly. Central catheter tip in superior vena cava. Electronically Signed   By: Lowella Grip III M.D.  On: 01/09/2016 21:38   US Abdomen Limited Ruq  Result Date: 01/10/2016 CLINICAL DATA:  Cirrhosis with questionable portal vein thrombosis. EXAM: US ABDOMEN LIMITED - RIGHT UPPER QUADRANT COMPARISON:  None. FINDINGS: Exam somewhat difficult due to patient body habitus. Gallbladder: Mild cholelithiasis with a 1.1 cm stone over the region of the gallbladder neck. Borderline wall thickening measuring 3.4 mm. No pericholecystic fluid. Negative sonographic Murphy sign. Common bile duct: Diameter: 3.0 mm. Liver: Somewhat heterogeneous echotexture with nodular contour compatible with known cirrhosis. No focal mass. Portal  vein is patent with normal hepatopetal flow and normal Doppler waveform and velocity of 27 cm/sec. IMPRESSION: Evidence of known cirrhosis. Patent portal vein with normal hepatopetal flow and normal velocity of 27 cm/sec. Mild cholelithiasis with 1.1 cm stone over the region of the gallbladder neck. No additional sonographic evidence to suggest cholecystitis. Electronically Signed   By: Marin Olp M.D.   On: 01/10/2016 08:48    Microbiology: No results found for this or any previous visit (from the past 240 hour(s)).   Labs: Basic Metabolic Panel:  Recent Labs Lab 01/13/16 0645 01/14/16 0557 01/15/16 0645 01/16/16 0706 01/17/16 0659  NA 136 135 136 136 139  K 3.6 3.8 3.7 3.8 3.8  CL 101 100* 101 101 103  CO2 25 27 27 29 30   GLUCOSE 85 81 93 81 118*  BUN 48* 47* 45* 43* 40*  CREATININE 1.64* 1.66* 1.58* 1.53* 1.37*  CALCIUM 9.2 8.4* 8.5* 8.4* 8.7*  PHOS 4.5 4.8* 5.0* 5.1* 4.4   Liver Function Tests:  Recent Labs Lab 01/12/16 0629 01/13/16 0645 01/14/16 0557 01/15/16 0645 01/16/16 0706 01/17/16 0659  AST 69* 65*  --   --   --   --   ALT 32 30  --   --   --   --   ALKPHOS 111 106  --   --   --   --   BILITOT 4.0* 4.0*  --   --   --   --   PROT 6.7 6.6  --   --   --   --   ALBUMIN 3.0* 3.1*  3.1* 2.9* 2.8* 2.7* 2.6*   No results for input(s): LIPASE, AMYLASE in the last 168 hours.  Recent Labs Lab 01/13/16 0645  AMMONIA 72*   CBC:  Recent Labs Lab 01/13/16 0645 01/14/16 0557 01/15/16 0645 01/16/16 0706 01/17/16 0658  WBC 10.5 11.7* 10.3 9.0 8.3  HGB 6.7* 8.0* 8.2* 8.5* 8.8*  HCT 20.8* 24.6* 25.1* 26.7* 27.6*  MCV 91.7 92.5 91.9 94.0 94.2  PLT 102* 100* 108* 111* 113*   Cardiac Enzymes: No results for input(s): CKTOTAL, CKMB, CKMBINDEX, TROPONINI in the last 168 hours. BNP: BNP (last 3 results)  Recent Labs  12/26/15 2230 01/03/16 2200  BNP 30.0 274.0*    ProBNP (last 3 results) No results for input(s): PROBNP in the last 8760  hours.  CBG:  Recent Labs Lab 01/10/16 2124  GLUCAP 96       Signed:  Allora Bains MD.  [facility] 01/17/2016, 1:21 PM

## 2016-01-18 NOTE — Telephone Encounter (Signed)
FMLA paperwork complete 

## 2016-02-08 ENCOUNTER — Ambulatory Visit (INDEPENDENT_AMBULATORY_CARE_PROVIDER_SITE_OTHER): Payer: BLUE CROSS/BLUE SHIELD | Admitting: Gastroenterology

## 2016-02-08 ENCOUNTER — Other Ambulatory Visit: Payer: Self-pay | Admitting: Gastroenterology

## 2016-02-08 ENCOUNTER — Encounter: Payer: Self-pay | Admitting: Gastroenterology

## 2016-02-08 VITALS — BP 130/75 | HR 73 | Temp 98.6°F | Ht 74.0 in | Wt 337.4 lb

## 2016-02-08 DIAGNOSIS — K7031 Alcoholic cirrhosis of liver with ascites: Secondary | ICD-10-CM

## 2016-02-08 MED ORDER — PANTOPRAZOLE SODIUM 40 MG PO TBEC: 40.0000 mg | DELAYED_RELEASE_TABLET | Freq: Two times a day (BID) | ORAL | 11 refills | Status: DC

## 2016-02-08 NOTE — Progress Notes (Signed)
Appointment made

## 2016-02-08 NOTE — Progress Notes (Signed)
   Subjective:    Patient ID: Antonio [name], male    DOB: Apr 13, 1971, 45 y.o.   MRN: [id number] TAPPER,DAVID B, MD  HPI WEIGHT DOWN FROM 389 LBS IN AUG 2017 TO 339 LBS. FEEL BETTER. TAKING MEDS. NOT DRINKING ALCOHOL. LACTULOSE: 3 TIMES A DAY. BMs: 2X/DAY. TAKING XIFAXIN. HAD DIARRHEA AFTER EATING A BURGER FROM [facility].   PT DENIES FEVER, CHILLS, HEMATOCHEZIA, HEMATEMESIS, nausea, vomiting, melena, CHEST PAIN, SHORTNESS OF BREATH, CHANGE IN BOWEL IN HABITS, constipation, abdominal pain, problems swallowing, OR heartburn or indigestion.   Past Medical History:  Diagnosis Date  . Alcohol abuse   . Alcoholic hepatitis   . Cirrhosis with alcoholism [facility])     Past Surgical History:  Procedure Laterality Date  . Colonoscopy     per patient around 2014 in [place]   . ESOPHAGOGASTRODUODENOSCOPY (EGD) WITH PROPOFOL [name] 12/28/2015   Procedure: ESOPHAGOGASTRODUODENOSCOPY (EGD) WITH PROPOFOL;  Surgeon: Danie Binder, MD;  [facility];  Service: Endoscopy;  Laterality: [name];    No Known Allergies  Current Outpatient Prescriptions  Medication Sig Dispense Refill  . Calcium Carb-Cholecalciferol (CALCIUM 600+D) 600-800 MG-UNIT TABS Take 1 tablet by mouth daily.    . folic acid (FOLVITE) 1 MG tablet Take 1 tablet (1 mg total) by mouth daily.    Marland Kitchen lactulose (CHRONULAC) 10 GM/15ML solution Take 30 mLs (20 g total) by mouth 3 (three) times daily.    . magnesium oxide (MAG-OX) 400 (241.3 Mg) MG tablet Take 1 tablet (400 mg total) by mouth 2 (two) times daily.    . nadolol (CORGARD) 40 MG tablet Take 1 tablet (40 mg total) by mouth 2 (two) times daily.    . pantoprazole (PROTONIX) 40 MG tablet Take 1 tablet (40 mg total) by mouth 2 (two) times daily before a meal.    . potassium chloride SA (K-DUR,KLOR-CON) 20 MEQ tablet Take 20 mEq by mouth 2 (two) times daily.    Marland Kitchen spironolactone (ALDACTONE) 100 MG tablet Take 0.5 tablets (50 mg total) by mouth daily.    Marland Kitchen thiamine 100 MG tablet Take 1  tablet (100 mg total) by mouth daily.    Marland Kitchen torsemide (DEMADEX) 20 MG tablet Take 1 tablet (20 mg total) by mouth daily. For treatment of swelling.    . rifaximin (XIFAXAN) 550 MG TABS tablet Take 1 tablet (550 mg total) by mouth 2 (two) times daily.      Review of Systems PER HPI OTHERWISE ALL SYSTEMS ARE NEGATIVE.     Objective:   Physical Exam  Constitutional: He is oriented to person, place, and time. He appears well-developed and well-nourished. No distress.  HENT:  Head: Normocephalic and atraumatic.  Mouth/Throat: Oropharynx is clear and moist. No oropharyngeal exudate.  Eyes: Pupils are equal, round, and reactive to light. No scleral icterus.  Neck: Normal range of motion. Neck supple.  Cardiovascular: Normal rate, regular rhythm and normal heart sounds.   Pulmonary/Chest: Effort normal and breath sounds normal. No respiratory distress.  Abdominal: Soft. Bowel sounds are normal. He exhibits no distension. There is no tenderness.  Musculoskeletal: He exhibits edema (3+ BILATERAL LOWER EXTREMITIES).  Lymphadenopathy:    He has no cervical adenopathy.  Neurological: He is alert and oriented to person, place, and time.  NO FOCAL DEFICITS, NO ASTERIXIS  Psychiatric: He has a normal mood and affect.  Vitals reviewed.     Assessment & Plan:

## 2016-02-08 NOTE — Patient Instructions (Addendum)
KEEP DOING WHAT YOU'RE DOING. IT'S WORKING.   COMPLETE YOUR BLOOD WORK.  I AM REFERRING YOU TO [facility] FOR A LIVER TRANSPLANT EVALUATION.  CONTINUE NADOLOL AND LACTULOSE.  SEE DR. BEFAKADU AND ASK HIM IF HE IS GOING TO CHANGE YOU FLUID PILLS.  USE PROTONIX IF NEEDED FOR HEARTBURN.  FOLLOW UP IN 6 WEEKS.

## 2016-02-08 NOTE — Assessment & Plan Note (Signed)
CLINICALLY IMPROVED. LAST LABS AUG 2017. NO BRBPR OR MELENA. ABSTAING FROM ETOH. KEEPING APPTS. TAKING MEDS.    COMPLETE BLOOD WORK NEXT WEEK. REFERRING  TO [facility] FOR A LIVER TRANSPLANT EVALUATION. CONTINUE NADOLOL AND LACTULOSE. SEE DR. BEFAKADU TO ADJUST DIURETICS USE PROTONIX IF NEEDED FOR HEARTBURN. FOLLOW UP IN 6 WEEKS.

## 2016-02-08 NOTE — Addendum Note (Signed)
Addended by: Danie Binder on: 02/08/2016 02:44 PM   Modules accepted: Orders

## 2016-02-08 NOTE — Progress Notes (Signed)
cc'ed to pcp °

## 2016-02-09 ENCOUNTER — Telehealth: Payer: Self-pay

## 2016-02-09 LAB — CBC WITH DIFFERENTIAL/PLATELET
BASOS ABS: 79 {cells}/uL (ref 0–200)
Basophils Relative: 1 %
EOS ABS: 316 {cells}/uL (ref 15–500)
EOS PCT: 4 %
HCT: 29.1 % — ABNORMAL LOW (ref 38.5–50.0)
HEMOGLOBIN: 9.4 g/dL — AB (ref 13.2–17.1)
LYMPHS ABS: 2449 {cells}/uL (ref 850–3900)
Lymphocytes Relative: 31 %
MCH: 28.7 pg (ref 27.0–33.0)
MCHC: 32.3 g/dL (ref 32.0–36.0)
MCV: 89 fL (ref 80.0–100.0)
MONOS PCT: 13 %
MPV: 9.8 fL (ref 7.5–12.5)
Monocytes Absolute: 1027 cells/uL — ABNORMAL HIGH (ref 200–950)
NEUTROS ABS: 4029 {cells}/uL (ref 1500–7800)
NEUTROS PCT: 51 %
Platelets: 194 10*3/uL (ref 140–400)
RBC: 3.27 MIL/uL — ABNORMAL LOW (ref 4.20–5.80)
RDW: 17.1 % — ABNORMAL HIGH (ref 11.0–15.0)
WBC: 7.9 10*3/uL (ref 3.8–10.8)

## 2016-02-09 LAB — HEPATITIS B SURFACE ANTIBODY,QUALITATIVE: HEP B S AB: NEGATIVE

## 2016-02-09 LAB — BILIRUBIN, DIRECT: BILIRUBIN DIRECT: 2.2 mg/dL — AB (ref <=0.2)

## 2016-02-09 LAB — PROTIME-INR
INR: 1.6 — ABNORMAL HIGH
Prothrombin Time: 16.6 s — ABNORMAL HIGH (ref 9.0–11.5)

## 2016-02-09 LAB — HEPATITIS B SURFACE ANTIGEN: HEP B S AG: NEGATIVE

## 2016-02-09 LAB — HEPATITIS A ANTIBODY, TOTAL: Hep A Total Ab: NONREACTIVE

## 2016-02-09 LAB — HEPATITIS C ANTIBODY: HCV AB: NEGATIVE

## 2016-02-09 LAB — AMMONIA: AMMONIA: 90 umol/L — AB (ref <=47)

## 2016-02-09 NOTE — Telephone Encounter (Signed)
I called [facility] and spoke to [place] and had CMP added per Dr. Oneida Alar.

## 2016-02-10 ENCOUNTER — Other Ambulatory Visit: Payer: Self-pay

## 2016-02-10 DIAGNOSIS — K7031 Alcoholic cirrhosis of liver with ascites: Secondary | ICD-10-CM

## 2016-02-10 LAB — COMPREHENSIVE METABOLIC PANEL
ALT: 19 U/L (ref 9–46)
AST: 61 U/L — AB (ref 10–40)
Albumin: 2.4 g/dL — ABNORMAL LOW (ref 3.6–5.1)
Alkaline Phosphatase: 219 U/L — ABNORMAL HIGH (ref 40–115)
BILIRUBIN TOTAL: 4.2 mg/dL — AB (ref 0.2–1.2)
BUN: 23 mg/dL (ref 7–25)
CALCIUM: 8.5 mg/dL — AB (ref 8.6–10.3)
CO2: 26 mmol/L (ref 20–31)
Chloride: 100 mmol/L (ref 98–110)
Creat: 1.41 mg/dL — ABNORMAL HIGH (ref 0.60–1.35)
GLUCOSE: 87 mg/dL (ref 65–99)
POTASSIUM: 3.8 mmol/L (ref 3.5–5.3)
Sodium: 134 mmol/L — ABNORMAL LOW (ref 135–146)
Total Protein: 7 g/dL (ref 6.1–8.1)

## 2016-02-10 NOTE — Telephone Encounter (Signed)
REVIEWED-NO ADDITIONAL RECOMMENDATIONS. 

## 2016-02-10 NOTE — Telephone Encounter (Signed)
I called [facility] and spoke with Webb Silversmith, she could not find anywhere that CMP was added yesterday. They do not have any additional results. I have asked her to add  CMP. Webb Silversmith stated she added it on and results will be sent to [facility].

## 2016-02-12 NOTE — Telephone Encounter (Addendum)
Called patient TO DISCUSS RESULTS. Cell signal weak. Called home phone. Explained liver numbers are improving(AUG 2017 MELD 27--> SEP 2017 MELD 23). PT REFERRED FOR LIVER TRANSPLANT EVALUATION. REPEAT LABS IN DEC 2017. PT NEED [facility] SERIES WITH DR. TAPPER. EXPLAINED TO PT WHY IT IS IMPORTANT TO COMPLETE THE VACCINE. PATIENT VOICED HIS UNDERSTANDING.  HB IMPROVED. PLT NOW NORMAL.

## 2016-02-13 NOTE — Telephone Encounter (Signed)
Pt's wife called and asked for the order for the Hepatitis vaccines to be faxed to Dr. Scotty Court.  I faxed it to him.

## 2016-02-16 ENCOUNTER — Telehealth: Payer: Self-pay | Admitting: Gastroenterology

## 2016-02-16 NOTE — Telephone Encounter (Signed)
I left VM that [name] and [facility] in [place] are giving those vaccines.

## 2016-02-16 NOTE — Telephone Encounter (Signed)
[facility] CAN GO TO GET HIS HEP VACCINES.  DR TAPPER IS BACKLOGGED AND THEY CAN NOT GET IT THERE.

## 2016-02-23 ENCOUNTER — Telehealth: Payer: Self-pay

## 2016-02-23 NOTE — Telephone Encounter (Signed)
PT's wife called to say that he cannot urinate except just a tiny amount of urine and he is having a lot of swelling.   He started his Hep A and Hep B vaccinations on Monday and Tuesday of this week.   Pt is taking the Furosemide also and not urinating much.  I told pt's wife, Donovan Kail, that he should go to the ED.  I spoke to Laban Emperor, NP and she agrees.

## 2016-02-23 NOTE — Telephone Encounter (Signed)
Agree 

## 2016-02-27 ENCOUNTER — Telehealth: Payer: Self-pay | Admitting: General Practice

## 2016-02-27 NOTE — Telephone Encounter (Signed)
Patient is made aware of Dr. Oneida Alar response below

## 2016-02-27 NOTE — Telephone Encounter (Signed)
Patient made aware.

## 2016-02-27 NOTE — Telephone Encounter (Signed)
Per Ashley([facility]) the patient has a Rx from Dr. Wenda Overland for Lactulose 3 times a day with 5 refills

## 2016-02-27 NOTE — Telephone Encounter (Signed)
I received a call from Mr. Antonio [name] wife, Antonio [name] stating the patient needs a refill on his Lactulose.  It appears that Dr. Caryn Section wrote for a Rx for lactulose with three refills, however the wife states he is out of refills.  I will call [facility] to inquire about this

## 2016-02-27 NOTE — Telephone Encounter (Signed)
PLEASE CALL PT. HIS EYES ARE YELLOW BECAUSE HIS BILIRUBIN IS STILL SLIGHTLY ELEVATED. PUFFY EYES CAN COME FROM LOW ALBUMIN. LACK OF SLEEP, AND GENETICS.

## 2016-02-27 NOTE — Telephone Encounter (Signed)
Patient says his eyes are puffy and yellow.    However he denies having any rectal bleeding, abd pain, nausea & vomiting, and fevers.  He wanted to ask Dr. Oneida Alar what is causing his eyes to be puffy and yellow.  Routing to Dr. Oneida Alar for recommendations

## 2016-02-28 ENCOUNTER — Other Ambulatory Visit: Payer: Self-pay

## 2016-02-29 NOTE — Progress Notes (Signed)
Patient has received refills from pcp. See other correspondence.

## 2016-03-06 ENCOUNTER — Inpatient Hospital Stay (HOSPITAL_COMMUNITY)
Admission: EM | Admit: 2016-03-06 | Discharge: 2016-03-23 | DRG: 441 | Disposition: A | Discharge status: Home / Self Care | Payer: BLUE CROSS/BLUE SHIELD | Attending: Family Medicine | Admitting: Family Medicine

## 2016-03-06 ENCOUNTER — Emergency Department (HOSPITAL_COMMUNITY): Payer: BLUE CROSS/BLUE SHIELD

## 2016-03-06 ENCOUNTER — Encounter (HOSPITAL_COMMUNITY): Payer: Self-pay | Admitting: Emergency Medicine

## 2016-03-06 DIAGNOSIS — D649 Anemia, unspecified: Secondary | ICD-10-CM | POA: Diagnosis not present

## 2016-03-06 DIAGNOSIS — R34 Anuria and oliguria: Secondary | ICD-10-CM | POA: Diagnosis not present

## 2016-03-06 DIAGNOSIS — Z66 Do not resuscitate: Secondary | ICD-10-CM | POA: Diagnosis present

## 2016-03-06 DIAGNOSIS — K3189 Other diseases of stomach and duodenum: Secondary | ICD-10-CM | POA: Diagnosis present

## 2016-03-06 DIAGNOSIS — K921 Melena: Secondary | ICD-10-CM | POA: Diagnosis present

## 2016-03-06 DIAGNOSIS — D62 Acute posthemorrhagic anemia: Secondary | ICD-10-CM | POA: Diagnosis not present

## 2016-03-06 DIAGNOSIS — Z9889 Other specified postprocedural states: Secondary | ICD-10-CM | POA: Diagnosis not present

## 2016-03-06 DIAGNOSIS — R7989 Other specified abnormal findings of blood chemistry: Secondary | ICD-10-CM | POA: Diagnosis not present

## 2016-03-06 DIAGNOSIS — E8770 Fluid overload, unspecified: Secondary | ICD-10-CM | POA: Diagnosis present

## 2016-03-06 DIAGNOSIS — D631 Anemia in chronic kidney disease: Secondary | ICD-10-CM | POA: Diagnosis present

## 2016-03-06 DIAGNOSIS — K625 Hemorrhage of anus and rectum: Secondary | ICD-10-CM

## 2016-03-06 DIAGNOSIS — K219 Gastro-esophageal reflux disease without esophagitis: Secondary | ICD-10-CM | POA: Diagnosis present

## 2016-03-06 DIAGNOSIS — R600 Localized edema: Secondary | ICD-10-CM | POA: Diagnosis present

## 2016-03-06 DIAGNOSIS — K766 Portal hypertension: Secondary | ICD-10-CM | POA: Diagnosis present

## 2016-03-06 DIAGNOSIS — D689 Coagulation defect, unspecified: Secondary | ICD-10-CM | POA: Diagnosis not present

## 2016-03-06 DIAGNOSIS — N170 Acute kidney failure with tubular necrosis: Secondary | ICD-10-CM

## 2016-03-06 DIAGNOSIS — D696 Thrombocytopenia, unspecified: Secondary | ICD-10-CM | POA: Diagnosis present

## 2016-03-06 DIAGNOSIS — I509 Heart failure, unspecified: Secondary | ICD-10-CM | POA: Diagnosis present

## 2016-03-06 DIAGNOSIS — I4892 Unspecified atrial flutter: Secondary | ICD-10-CM | POA: Diagnosis present

## 2016-03-06 DIAGNOSIS — R0902 Hypoxemia: Secondary | ICD-10-CM

## 2016-03-06 DIAGNOSIS — K7290 Hepatic failure, unspecified without coma: Secondary | ICD-10-CM | POA: Diagnosis not present

## 2016-03-06 DIAGNOSIS — R739 Hyperglycemia, unspecified: Secondary | ICD-10-CM | POA: Diagnosis present

## 2016-03-06 DIAGNOSIS — I8500 Esophageal varices without bleeding: Secondary | ICD-10-CM | POA: Diagnosis present

## 2016-03-06 DIAGNOSIS — D124 Benign neoplasm of descending colon: Secondary | ICD-10-CM | POA: Diagnosis present

## 2016-03-06 DIAGNOSIS — K7460 Unspecified cirrhosis of liver: Secondary | ICD-10-CM | POA: Diagnosis present

## 2016-03-06 DIAGNOSIS — D120 Benign neoplasm of cecum: Secondary | ICD-10-CM | POA: Diagnosis present

## 2016-03-06 DIAGNOSIS — N179 Acute kidney failure, unspecified: Secondary | ICD-10-CM | POA: Diagnosis present

## 2016-03-06 DIAGNOSIS — E871 Hypo-osmolality and hyponatremia: Secondary | ICD-10-CM | POA: Diagnosis not present

## 2016-03-06 DIAGNOSIS — Z87891 Personal history of nicotine dependence: Secondary | ICD-10-CM | POA: Diagnosis not present

## 2016-03-06 DIAGNOSIS — K7010 Alcoholic hepatitis without ascites: Secondary | ICD-10-CM | POA: Diagnosis present

## 2016-03-06 DIAGNOSIS — Z452 Encounter for adjustment and management of vascular access device: Secondary | ICD-10-CM

## 2016-03-06 DIAGNOSIS — N183 Chronic kidney disease, stage 3 (moderate): Secondary | ICD-10-CM | POA: Diagnosis not present

## 2016-03-06 DIAGNOSIS — K7030 Alcoholic cirrhosis of liver without ascites: Secondary | ICD-10-CM | POA: Diagnosis present

## 2016-03-06 DIAGNOSIS — D72829 Elevated white blood cell count, unspecified: Secondary | ICD-10-CM | POA: Diagnosis present

## 2016-03-06 DIAGNOSIS — M6281 Muscle weakness (generalized): Secondary | ICD-10-CM

## 2016-03-06 DIAGNOSIS — R601 Generalized edema: Secondary | ICD-10-CM | POA: Diagnosis not present

## 2016-03-06 DIAGNOSIS — E876 Hypokalemia: Secondary | ICD-10-CM

## 2016-03-06 DIAGNOSIS — K6389 Other specified diseases of intestine: Secondary | ICD-10-CM | POA: Diagnosis present

## 2016-03-06 DIAGNOSIS — E43 Unspecified severe protein-calorie malnutrition: Secondary | ICD-10-CM | POA: Diagnosis not present

## 2016-03-06 DIAGNOSIS — Z79899 Other long term (current) drug therapy: Secondary | ICD-10-CM | POA: Diagnosis not present

## 2016-03-06 LAB — URINE MICROSCOPIC-ADD ON: RBC / HPF: NONE SEEN RBC/hpf (ref 0–5)

## 2016-03-06 LAB — URINALYSIS, ROUTINE W REFLEX MICROSCOPIC
Glucose, UA: NEGATIVE mg/dL
Hgb urine dipstick: NEGATIVE
KETONES UR: NEGATIVE mg/dL
NITRITE: NEGATIVE
PROTEIN: 30 mg/dL — AB
Specific Gravity, Urine: 1.015 (ref 1.005–1.030)
pH: 5.5 (ref 5.0–8.0)

## 2016-03-06 LAB — CBC WITH DIFFERENTIAL/PLATELET
BASOS ABS: 0.1 10*3/uL (ref 0.0–0.1)
BASOS PCT: 1 %
EOS ABS: 1.3 10*3/uL — AB (ref 0.0–0.7)
Eosinophils Relative: 12 %
HCT: 23 % — ABNORMAL LOW (ref 39.0–52.0)
Hemoglobin: 7.4 g/dL — ABNORMAL LOW (ref 13.0–17.0)
Lymphocytes Relative: 14 %
Lymphs Abs: 1.4 10*3/uL (ref 0.7–4.0)
MCH: 29.6 pg (ref 26.0–34.0)
MCHC: 32.2 g/dL (ref 30.0–36.0)
MCV: 92 fL (ref 78.0–100.0)
MONO ABS: 1.4 10*3/uL — AB (ref 0.1–1.0)
MONOS PCT: 13 %
Neutro Abs: 6.1 10*3/uL (ref 1.7–7.7)
Neutrophils Relative %: 60 %
PLATELETS: 149 10*3/uL — AB (ref 150–400)
RBC: 2.5 MIL/uL — ABNORMAL LOW (ref 4.22–5.81)
RDW: 19 % — AB (ref 11.5–15.5)
WBC: 10.2 10*3/uL (ref 4.0–10.5)

## 2016-03-06 LAB — LIPASE, BLOOD: Lipase: 59 U/L — ABNORMAL HIGH (ref 11–51)

## 2016-03-06 LAB — COMPREHENSIVE METABOLIC PANEL
ALT: 23 U/L (ref 17–63)
AST: 61 U/L — AB (ref 15–41)
Albumin: 2.3 g/dL — ABNORMAL LOW (ref 3.5–5.0)
Alkaline Phosphatase: 164 U/L — ABNORMAL HIGH (ref 38–126)
Anion gap: 7 (ref 5–15)
BUN: 53 mg/dL — AB (ref 6–20)
CHLORIDE: 99 mmol/L — AB (ref 101–111)
CO2: 22 mmol/L (ref 22–32)
CREATININE: 3.26 mg/dL — AB (ref 0.61–1.24)
Calcium: 8.7 mg/dL — ABNORMAL LOW (ref 8.9–10.3)
GFR calc Af Amer: 25 mL/min — ABNORMAL LOW (ref >60)
GFR calc non Af Amer: 22 mL/min — ABNORMAL LOW (ref >60)
Glucose, Bld: 108 mg/dL — ABNORMAL HIGH (ref 65–99)
POTASSIUM: 4.7 mmol/L (ref 3.5–5.1)
SODIUM: 128 mmol/L — AB (ref 135–145)
Total Bilirubin: 7 mg/dL — ABNORMAL HIGH (ref 0.3–1.2)
Total Protein: 7.6 g/dL (ref 6.5–8.1)

## 2016-03-06 LAB — OSMOLALITY: Osmolality: 295 mOsm/kg (ref 275–295)

## 2016-03-06 LAB — PROTIME-INR
INR: 2.18
Prothrombin Time: 24.6 seconds — ABNORMAL HIGH (ref 11.4–15.2)

## 2016-03-06 LAB — BRAIN NATRIURETIC PEPTIDE: B NATRIURETIC PEPTIDE 5: 545 pg/mL — AB (ref 0.0–100.0)

## 2016-03-06 LAB — AMMONIA: AMMONIA: 129 umol/L — AB (ref 9–35)

## 2016-03-06 MED ORDER — ACETAMINOPHEN 650 MG RE SUPP: 650.0000 mg | Freq: Four times a day (QID) | RECTAL | Status: DC | PRN

## 2016-03-06 MED ORDER — RIFAXIMIN 550 MG PO TABS
550.0000 mg | ORAL_TABLET | Freq: Two times a day (BID) | ORAL | Status: DC
  Administered 2016-03-06 – 2016-03-23 (×33): 550 mg via ORAL
  Filled 2016-03-06 (×33): qty 1

## 2016-03-06 MED ORDER — FUROSEMIDE 10 MG/ML IJ SOLN
INTRAMUSCULAR | Status: AC
  Filled 2016-03-06: qty 20

## 2016-03-06 MED ORDER — DOCUSATE SODIUM 100 MG PO CAPS
100.0000 mg | ORAL_CAPSULE | Freq: Two times a day (BID) | ORAL | Status: DC
  Administered 2016-03-06 – 2016-03-18 (×18): 100 mg via ORAL
  Filled 2016-03-06 (×23): qty 1

## 2016-03-06 MED ORDER — VITAMIN B-1 100 MG PO TABS
100.0000 mg | ORAL_TABLET | Freq: Every day | ORAL | Status: DC
  Administered 2016-03-06 – 2016-03-23 (×17): 100 mg via ORAL
  Filled 2016-03-06 (×17): qty 1

## 2016-03-06 MED ORDER — FUROSEMIDE 10 MG/ML IJ SOLN
160.0000 mg | Freq: Two times a day (BID) | INTRAVENOUS | Status: DC
  Administered 2016-03-06 – 2016-03-10 (×7): 160 mg via INTRAVENOUS
  Filled 2016-03-06 (×12): qty 16

## 2016-03-06 MED ORDER — MAGNESIUM OXIDE 400 (241.3 MG) MG PO TABS
400.0000 mg | ORAL_TABLET | Freq: Two times a day (BID) | ORAL | Status: DC
  Administered 2016-03-06 – 2016-03-22 (×31): 400 mg via ORAL
  Filled 2016-03-06 (×31): qty 1

## 2016-03-06 MED ORDER — SODIUM CHLORIDE 0.9% FLUSH
3.0000 mL | Freq: Two times a day (BID) | INTRAVENOUS | Status: DC
  Administered 2016-03-06 – 2016-03-23 (×32): 3 mL via INTRAVENOUS

## 2016-03-06 MED ORDER — ENOXAPARIN SODIUM 80 MG/0.8ML ~~LOC~~ SOLN
80.0000 mg | SUBCUTANEOUS | Status: DC
Start: 2016-03-06 — End: 2016-03-07
  Administered 2016-03-06: 80 mg via SUBCUTANEOUS
  Filled 2016-03-06: qty 0.8

## 2016-03-06 MED ORDER — PANTOPRAZOLE SODIUM 40 MG PO TBEC
40.0000 mg | DELAYED_RELEASE_TABLET | Freq: Two times a day (BID) | ORAL | Status: DC
  Administered 2016-03-06 – 2016-03-23 (×33): 40 mg via ORAL
  Filled 2016-03-06 (×33): qty 1

## 2016-03-06 MED ORDER — ONDANSETRON HCL 4 MG PO TABS: 4.0000 mg | ORAL_TABLET | Freq: Four times a day (QID) | ORAL | Status: DC | PRN

## 2016-03-06 MED ORDER — FOLIC ACID 1 MG PO TABS
1.0000 mg | ORAL_TABLET | Freq: Every day | ORAL | Status: DC
  Administered 2016-03-06 – 2016-03-23 (×17): 1 mg via ORAL
  Filled 2016-03-06 (×17): qty 1

## 2016-03-06 MED ORDER — ONDANSETRON HCL 4 MG/2ML IJ SOLN: 4.0000 mg | Freq: Four times a day (QID) | INTRAMUSCULAR | Status: DC | PRN

## 2016-03-06 MED ORDER — ACETAMINOPHEN 325 MG PO TABS
650.0000 mg | ORAL_TABLET | Freq: Four times a day (QID) | ORAL | Status: DC | PRN
  Filled 2016-03-06 (×2): qty 2

## 2016-03-06 MED ORDER — FUROSEMIDE 10 MG/ML IJ SOLN
40.0000 mg | Freq: Once | INTRAMUSCULAR | Status: AC
  Administered 2016-03-06: 40 mg via INTRAVENOUS
  Filled 2016-03-06: qty 4

## 2016-03-06 MED ORDER — LACTULOSE 10 GM/15ML PO SOLN
20.0000 g | Freq: Three times a day (TID) | ORAL | Status: DC
  Administered 2016-03-06 – 2016-03-23 (×48): 20 g via ORAL
  Filled 2016-03-06 (×48): qty 30

## 2016-03-06 NOTE — Consult Note (Signed)
Reason for Consult: Acute kidney injury superimposed on chronic and increased leg edema Referring Physician: Hospitalist Group  Antonio [name] is an 45 y.o. male.  HPI: He is a patient who has history of liver cirrhosis, anasarca, morbid obesity, chronic renal failure stage II presently came because of decreased urine output, increased swelling and some difficulty breathing the last 2 days. According the patient he was taking torsemide 40 mg once a day however his urine output declined and his leg swelling has increased. Patient also stated that she was feeling thirsty and drinking gross of water. Presently he denies any nausea or vomiting. According to patient he said he is much better than when he came to the hospital last time and at this moment he decided to come before things get worse. During evaluation his creatinine has increased significantly hence consult called.  Past Medical History:  Diagnosis Date  . Alcohol abuse   . Alcoholic hepatitis   . Cirrhosis with alcoholism [facility])     Past Surgical History:  Procedure Laterality Date  . Colonoscopy     per patient around 2014 in [place]   . ESOPHAGOGASTRODUODENOSCOPY (EGD) WITH PROPOFOL [name] 12/28/2015   Procedure: ESOPHAGOGASTRODUODENOSCOPY (EGD) WITH PROPOFOL;  Surgeon: Danie Binder, MD;  Location[facility];  Service: Endoscopy;  Laterality: [name];    Family History  Problem Relation Age of Onset  . Colon cancer Neg Hx   . Liver disease Neg Hx     Social History:  reports that he has quit smoking. His smoking use included Cigarettes. He smoked 0.25 packs per day. He has never used smokeless tobacco. He reports that he does not drink alcohol or use drugs.  Allergies: No Known Allergies  Medications: I have reviewed the patient's current medications.  Results for orders placed or performed during the hospital encounter of 03/06/16 (from the past 48 hour(s))  Comprehensive metabolic panel     Status: Abnormal   Collection Time:  03/06/16  1:00 PM  Result Value Ref Range   Sodium 128 (L) 135 - 145 mmol/L   Potassium 4.7 3.5 - 5.1 mmol/L   Chloride 99 (L) 101 - 111 mmol/L   CO2 22 22 - 32 mmol/L   Glucose, Bld 108 (H) 65 - 99 mg/dL   BUN 53 (H) 6 - 20 mg/dL   Creatinine, Ser 3.26 (H) 0.61 - 1.24 mg/dL   Calcium 8.7 (L) 8.9 - 10.3 mg/dL   Total Protein 7.6 6.5 - 8.1 g/dL   Albumin 2.3 (L) 3.5 - 5.0 g/dL   AST 61 (H) 15 - 41 U/L   ALT 23 17 - 63 U/L   Alkaline Phosphatase 164 (H) 38 - 126 U/L   Total Bilirubin 7.0 (H) 0.3 - 1.2 mg/dL   GFR calc non Af Amer 22 (L) >60 mL/min   GFR calc Af Amer 25 (L) >60 mL/min    Comment: (NOTE) The eGFR has been calculated using the CKD EPI equation. This calculation has not been validated in all clinical situations. eGFR's persistently <60 mL/min signify possible Chronic Kidney Disease.    Anion gap 7 5 - 15  Lipase, blood     Status: Abnormal   Collection Time: 03/06/16  1:00 PM  Result Value Ref Range   Lipase 59 (H) 11 - 51 U/L  Brain natriuretic peptide     Status: Abnormal   Collection Time: 03/06/16  1:00 PM  Result Value Ref Range   B Natriuretic Peptide 545.0 (H) 0.0 -  100.0 pg/mL  CBC with Differential     Status: Abnormal   Collection Time: 03/06/16  1:00 PM  Result Value Ref Range   WBC 10.2 4.0 - 10.5 K/uL   RBC 2.50 (L) 4.22 - 5.81 MIL/uL   Hemoglobin 7.4 (L) 13.0 - 17.0 g/dL   HCT 23.0 (L) 39.0 - 52.0 %   MCV 92.0 78.0 - 100.0 fL   MCH 29.6 26.0 - 34.0 pg   MCHC 32.2 30.0 - 36.0 g/dL   RDW 19.0 (H) 11.5 - 15.5 %   Platelets 149 (L) 150 - 400 K/uL   Neutrophils Relative % 60 %   Neutro Abs 6.1 1.7 - 7.7 K/uL   Lymphocytes Relative 14 %   Lymphs Abs 1.4 0.7 - 4.0 K/uL   Monocytes Relative 13 %   Monocytes Absolute 1.4 (H) 0.1 - 1.0 K/uL   Eosinophils Relative 12 %   Eosinophils Absolute 1.3 (H) 0.0 - 0.7 K/uL   Basophils Relative 1 %   Basophils Absolute 0.1 0.0 - 0.1 K/uL  Urinalysis, Routine w reflex microscopic     Status: Abnormal    Collection Time: 03/06/16  2:00 PM  Result Value Ref Range   Color, Urine YELLOW YELLOW   APPearance CLEAR CLEAR   Specific Gravity, Urine 1.015 1.005 - 1.030   pH 5.5 5.0 - 8.0   Glucose, UA NEGATIVE NEGATIVE mg/dL   Hgb urine dipstick NEGATIVE NEGATIVE   Bilirubin Urine SMALL (A) NEGATIVE   Ketones, ur NEGATIVE NEGATIVE mg/dL   Protein, ur 30 (A) NEGATIVE mg/dL   Nitrite NEGATIVE NEGATIVE   Leukocytes, UA TRACE (A) NEGATIVE  Urine microscopic-add on     Status: Abnormal   Collection Time: 03/06/16  2:00 PM  Result Value Ref Range   Squamous Epithelial / LPF 0-5 (A) NONE SEEN   WBC, UA 0-5 0 - 5 WBC/hpf   RBC / HPF NONE SEEN 0 - 5 RBC/hpf   Bacteria, UA FEW (A) NONE SEEN    Dg Chest 2 View  Result Date: 03/06/2016 CLINICAL DATA:  Dyspnea. EXAM: CHEST  2 VIEW COMPARISON:  01/09/2016 chest radiograph. FINDINGS: Stable cardiomediastinal silhouette with mild cardiomegaly. No pneumothorax. No pleural effusion. Mild pulmonary edema. No acute consolidative airspace disease. IMPRESSION: Mild congestive heart failure. Electronically Signed   By: Ilona Sorrel M.D.   On: 03/06/2016 13:26    Review of Systems  Constitutional: Negative for chills and fever.  Respiratory: Positive for shortness of breath. Negative for cough and hemoptysis.   Cardiovascular: Positive for leg swelling. Negative for orthopnea.  Gastrointestinal: Negative for nausea and vomiting.  Neurological: Positive for weakness.   Blood pressure (!) 98/36, pulse (!) 56, temperature 97.6 F (36.4 C), temperature source Oral, resp. rate 18, height '6\' 2"'$  (1.88 m), weight (!) 150.1 kg (331 lb), SpO2 100 %. Physical Exam  Constitutional: He is oriented to person, place, and time. No distress.  Eyes: No scleral icterus.  Neck: JVD present.  Cardiovascular: Normal rate and regular rhythm.   Musculoskeletal: He exhibits edema.  Neurological: He is alert and oriented to person, place, and time.    Assessment/Plan: Problem  #1 acute kidney injury superimposed on chronic. Etiology as this moment no clear. Possibly prerenal/ATN/hepatorenal. Patient is oliguric by history. Problem #2 chronic renal failure: His baseline creatinine when he was discharged on 01/22/16 was 1.37 with a GFR of greater than 60 hence his stage II. 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 4] if his urine output doesn't improve we'll do an ultrasound of the kidneys. 5] will use Aldactone 50 mg by mouth daily  Jarid Sasso S 03/06/2016, 5:26 PM

## 2016-03-06 NOTE — H&P (Signed)
History and Physical    Antonio [name] [id number] DOB: [birth year] DOA: 03/06/2016  PCP: Deloria Lair, MD Consultants:  Lowanda Foster - nephrology; Fields - GI Patient coming from: home - lives with wife  Chief Complaint: swelling, unable to void  HPI: Antonio [name] is a 45 y.o. male with medical history significant of ESLD and CKD with morbid obesity.  He presents today for worsening LE edema and anuria.  He reports that he called Dr. Florentina Addison office 2 days ago for these symptoms; he was not seen but they increased his torsemide to 40 mg daily and called in Lasix 40 mg daily.  He has not had any improvement.  While he was finally able to void here, he did not make any urine last night or at all today until a few minutes ago.  Slight SOB.  LE edema to thighs.  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  He was started on nadolol and rifaximin as well as IV Lasix and albumin,  He was told that his life expectancy was less than 6 months and that he was not a transplant candidate.  Palliative care was consulted and they declined home hospice but did elect to have a DNR status (which was rescinded today; he reported to Dr. Vanita Panda that he is being followed for his cirrhosis at [facility] and is on their transplant list).  He was discharged with torsemide 20 mg daily and Aldactone 50 mg daily.  ED Course: Per Dr. Vanita Panda: Patient with history of liver failure presents with worsening edema, fatigue, dyspnea. Patient is awake and alert, but is thought to have elevated BNP, as well as acute kidney injury Given the patient's renal dysfunction, as well as evidence for congestive heart failure, he required admission, for further evaluation.  Review of Systems: As per HPI; otherwise 10 point review of systems reviewed and negative.    Past Medical  History:  Diagnosis Date  . Alcohol abuse   . Alcoholic hepatitis   . Cirrhosis with alcoholism [facility])     Past Surgical History:  Procedure Laterality Date  . Colonoscopy     per patient around 2014 in [place]   . ESOPHAGOGASTRODUODENOSCOPY (EGD) WITH PROPOFOL [name] 12/28/2015   Procedure: ESOPHAGOGASTRODUODENOSCOPY (EGD) WITH PROPOFOL;  Surgeon: Danie Binder, MD;  Location[facility];  Service: Endoscopy;  Laterality: [name];    Social History   Social History  . Marital status: Married    Spouse name: [name]  . Number of children: [name]  . Years of education: [name]   Occupational History  . Not on file.   Social History Main Topics  . Smoking status: Former Smoker    Packs/day: 0.25    Types: Cigarettes  . Smokeless tobacco: Never Used  . Alcohol use No     Comment: former  . Drug use: No  . Sexual activity: Yes   Other Topics Concern  . Not on file   Social History Narrative  . No narrative on file    No Known Allergies  Family History  Problem Relation Age of Onset  . Colon cancer Neg Hx   . Liver disease Neg Hx     Prior to Admission medications   Medication Sig Start Date End Date Taking? Authorizing Provider  Calcium Carb-Cholecalciferol (CALCIUM 600+D) 600-800 MG-UNIT TABS Take 1 tablet by mouth 2 (two) times daily.    Yes Historical  Provider, MD  folic acid (FOLVITE) 1 MG tablet Take 1 tablet (1 mg total) by mouth daily. 12/31/15  Yes Orvan Falconer, MD  furosemide (LASIX) 40 MG tablet Take 40 mg by mouth daily.   Yes Historical Provider, MD  lactulose (CHRONULAC) 10 GM/15ML solution Take 30 mLs (20 g total) by mouth 3 (three) times daily. 01/17/16  Yes Rexene Alberts, MD  magnesium oxide (MAG-OX) 400 (241.3 Mg) MG tablet Take 1 tablet (400 mg total) by mouth 2 (two) times daily. 01/17/16  Yes Rexene Alberts, MD  MILK THISTLE PO Take 1 tablet by mouth daily.   Yes Historical Provider, MD  Misc Natural Products (DANDELION ROOT PO) Take 1 tablet by mouth daily.   Yes  Historical Provider, MD  nadolol (CORGARD) 40 MG tablet Take 1 tablet (40 mg total) by mouth 2 (two) times daily. 12/31/15  Yes Orvan Falconer, MD  pantoprazole (PROTONIX) 40 MG tablet Take 1 tablet (40 mg total) by mouth 2 (two) times daily before a meal. 02/08/16  Yes Danie Binder, MD  potassium chloride SA (K-DUR,KLOR-CON) 20 MEQ tablet Take 20 mEq by mouth 2 (two) times daily.   Yes Historical Provider, MD  spironolactone (ALDACTONE) 100 MG tablet Take 0.5 tablets (50 mg total) by mouth daily. Patient taking differently: Take 100 mg by mouth daily.  01/17/16  Yes Rexene Alberts, MD  thiamine 100 MG tablet Take 1 tablet (100 mg total) by mouth daily. 12/31/15  Yes Orvan Falconer, MD  torsemide (DEMADEX) 20 MG tablet Take 1 tablet (20 mg total) by mouth daily. For treatment of swelling. Patient taking differently: Take 20 mg by mouth 2 (two) times daily. For treatment of swelling. 01/17/16  Yes Rexene Alberts, MD  rifaximin (XIFAXAN) 550 MG TABS tablet Take 1 tablet (550 mg total) by mouth 2 (two) times daily. Patient not taking: Reported on 02/08/2016 12/31/15   Orvan Falconer, MD    Physical Exam: Vitals:   03/06/16 1405 03/06/16 1545 03/06/16 1600 03/06/16 1630  BP: (!) 113/44 (!) 98/40 (!) 95/48 (!) 98/36  Pulse: (!) 58  (!) 56 (!) 56  Resp: 18     Temp:      TempSrc:      SpO2: 100%  100% 100%  Weight:      Height:         General: Morbidly obese, poor historian, Appears calm and comfortable and is NAD Eyes:  PERRL, EOMI, normal lids, iris, +scleral icterus ENT:  grossly normal hearing, lips & tongue, mmm Neck:  no LAD, masses or thyromegaly Cardiovascular:  RRR, no m/r/g.   Respiratory:  Normal respiratory effort.  LLL crackles, but crackles along the RLL and RML Abdomen:  soft, ntnd, NABS; morbidly obese, unable to assess whether ascites is present due to large pannus Skin:  no rash or induration seen on limited exam, mild pedal erythema which is likely 2* to statis Musculoskeletal:  grossly normal  tone BUE/BLE, good ROM, no bony abnormality.  Pitting LE, 3-4+ extending up to knees Psychiatric:  grossly normal mood and affect, speech fluent and appropriate, AOx3 Neurologic:  CN 2-12 grossly intact, moves all extremities in coordinated fashion, sensation intact  Labs on Admission: I have personally reviewed following labs and imaging studies  CBC:  Recent Labs Lab 03/06/16 1300  WBC 10.2  NEUTROABS 6.1  HGB 7.4*  HCT 23.0*  MCV 92.0  PLT 123456*   Basic Metabolic Panel:  Recent Labs Lab 03/06/16 1300  NA 128*  K 4.7  CL 99*  CO2 22  GLUCOSE 108*  BUN 53*  CREATININE 3.26*  CALCIUM 8.7*   GFR: Estimated Creatinine Clearance: 45.2 mL/min (by C-G formula based on SCr of 3.26 mg/dL (H)). Liver Function Tests:  Recent Labs Lab 03/06/16 1300  AST 61*  ALT 23  ALKPHOS 164*  BILITOT 7.0*  PROT 7.6  ALBUMIN 2.3*    Recent Labs Lab 03/06/16 1300  LIPASE 59*   No results for input(s): AMMONIA in the last 168 hours. Coagulation Profile: No results for input(s): INR, PROTIME in the last 168 hours. Cardiac Enzymes: No results for input(s): CKTOTAL, CKMB, CKMBINDEX, TROPONINI in the last 168 hours. BNP (last 3 results) No results for input(s): PROBNP in the last 8760 hours. HbA1C: No results for input(s): HGBA1C in the last 72 hours. CBG: No results for input(s): GLUCAP in the last 168 hours. Lipid Profile: No results for input(s): CHOL, HDL, LDLCALC, TRIG, CHOLHDL, LDLDIRECT in the last 72 hours. Thyroid Function Tests: No results for input(s): TSH, T4TOTAL, FREET4, T3FREE, THYROIDAB in the last 72 hours. Anemia Panel: No results for input(s): VITAMINB12, FOLATE, FERRITIN, TIBC, IRON, RETICCTPCT in the last 72 hours. Urine analysis:    Component Value Date/Time   COLORURINE YELLOW 03/06/2016 1400   APPEARANCEUR CLEAR 03/06/2016 1400   LABSPEC 1.015 03/06/2016 1400   PHURINE 5.5 03/06/2016 1400   GLUCOSEU NEGATIVE 03/06/2016 1400   HGBUR NEGATIVE  03/06/2016 1400   BILIRUBINUR SMALL (A) 03/06/2016 1400   KETONESUR NEGATIVE 03/06/2016 1400   PROTEINUR 30 (A) 03/06/2016 1400   NITRITE NEGATIVE 03/06/2016 1400   LEUKOCYTESUR TRACE (A) 03/06/2016 1400    Creatinine Clearance: Estimated Creatinine Clearance: 45.2 mL/min (by C-G formula based on SCr of 3.26 mg/dL (H)).  Sepsis Labs: @LABRCNTIP (procalcitonin:4,lacticidven:4) )No results found for this or any previous visit (from the past 240 hour(s)).   Radiological Exams on Admission: Dg Chest 2 View  Result Date: 03/06/2016 CLINICAL DATA:  Dyspnea. EXAM: CHEST  2 VIEW COMPARISON:  01/09/2016 chest radiograph. FINDINGS: Stable cardiomediastinal silhouette with mild cardiomegaly. No pneumothorax. No pleural effusion. Mild pulmonary edema. No acute consolidative airspace disease. IMPRESSION: Mild congestive heart failure. Electronically Signed   By: Ilona Sorrel M.D.   On: 03/06/2016 13:26    EKG: Not done  Assessment/Plan Active Problems:   Anemia   Decompensation of cirrhosis of liver (HCC)   Anasarca   Thrombocytopenia (HCC)   Acute renal failure (HCC)   Bilateral edema of lower extremity   Hyponatremia   Hyperglycemia   Protein-calorie malnutrition, severe (HCC)   CHF (congestive heart failure) (HCC)   Decompensated cirrhosis of liver -Patient with prior hospitalization for similar in August -Since then, has been referred by Dr. Oneida Alar to [facility] for consideration of transplant - according to documentation from today, the request has been routed to the Transplant Coordinator for clinical approval (so he is not yet on the list).   -It does not appear that he has actually been seen at [facility] thus far.  -INR not done today (it is pending) but based on prior INR and other labs from today, his MELD score is 30, with a 52.6% mortality risk. -He does have anasarca, but this is likely a combination of ARF and hepatic failure. -He does not appear to have encephalopathy, but is a poor  historian and so it is difficult to tell. -Ammonia level not ordered, will order now. -Will continue Lactulose and Rifaximin for now. -He is likely to benefit from Albumin infusion, but will  defer to Dr. Lowanda Foster at this time.  ARF -Patient reports oliguria/anuria -Baseline creatinine prior to last hospitalization was as low as 0.60 (12/30/15), currently up to 3.26 -He is quite volume overloaded, suspect a combination of poor hepatic function in conjunction with CKD and inadequate diuresis -Dr. Lowanda Foster has been consulted and is planning to see the patient today -In review of the chart, he required large doses of Lasix during his prior hospitalization to effectively diurese (160mg  IV BID) -If he is not able to respond to Lasix and effectively diurese, he may require dialysis  Hyponatremia -Likely hypervolemic in nature -Would expect improvement with diuresis  CHF -Echo 01/04/16 showed normal EF but indeterminate grade abnormal diastolic function -Currently with elevated BNP, crackles on exam (R>L), and mid pulmonary edema on CXR -As above, needs diuresis  Anemia -Hgb down to 7.4 today, previously 9.4 -Will need to trend, this may be dilutional in nature but if ongoing downward trend he may require PRBC transfusion  Thrombocytopenia -Improved from prior -Will give Lovenox for now but will need to discontinue if trending down  Hyperglycemia -Will trend -No known h/o DM  Malnutrition -Nutrition consult   DVT prophylaxis: Lovenox  Code Status: Full - confirmed with patient/family Family Communication: Wife present throughout Disposition Plan: Home once clinically improved Consults called: Nephrology Admission status: Admit to SDU.   It is my clinical opinion that admission to INPATIENT is reasonable and necessary because this patient will require at least 2 midnights in the hospital to treat this condition based on the medical complexity of the problems presented.  Given the  aforementioned information, the predictability of an adverse outcome is felt to be significant.    Karmen Bongo MD [facility]  If 7PM-7AM, please contact night-coverage [email or web address] Password [email or web address]  03/06/2016, 4:56 PM

## 2016-03-06 NOTE — ED Provider Notes (Addendum)
[place] DEPT Provider Note   CSN: [id number] Arrival date & time: 03/06/16  [id number]  By signing my name below, I, Higinio Plan, attest that this documentation has been prepared under the direction and in the presence of Carmin Muskrat, MD . Electronically Signed: Higinio Plan, Scribe. 03/06/2016. 12:29 PM.  History   Chief Complaint Chief Complaint  Patient presents with  . Leg Swelling   The history is provided by the patient. No language interpreter was used.   HPI Comments: Riyad Brinser is a 45 y.o. male with PMHx of cirrhosis, who presents to the Emergency Department complaining of gradually worsening, bilateral leg swelling that began 2 days ago and worsened today. Pt reports he has been unable to urinate since yesterday; however, his last normal BM was today. He states associated shortness of breath that has occurred before but worsened with the onset of his leg swelling. He denies leg pain, abdominal pain, vomiting and fever. He states he is currently on the waiting list for a liver transplant at [facility] where he is followed for his cirrhosis.   Past Medical History:  Diagnosis Date  . Alcohol abuse   . Alcoholic hepatitis   . Cirrhosis with alcoholism [facility])    Patient Active Problem List   Diagnosis Date Noted  . Palliative care encounter   . DNR (do not resuscitate) discussion   . Goals of care, counseling/discussion   . Edema of upper extremity   . Bilateral edema of lower extremity   . Coagulopathy ([place]) 01/11/2016  . Encephalopathy, hepatic ([place]) 01/10/2016  . Acute renal failure ([place]) 01/10/2016  . Thrombocytopenia ([place]) 01/08/2016  . Anasarca 01/04/2016  . DOE (dyspnea on exertion)   . Peripheral edema   . Anemia, unspecified 12/27/2015  . Alcohol abuse 12/27/2015  . Decompensation of cirrhosis of liver ([place]) 12/27/2015  . Alcoholic cirrhosis of liver with ascites [facility])     Past Surgical History:  Procedure Laterality Date  . Colonoscopy     per patient  around 2014 in [place]   . ESOPHAGOGASTRODUODENOSCOPY (EGD) WITH PROPOFOL [name] 12/28/2015   Procedure: ESOPHAGOGASTRODUODENOSCOPY (EGD) WITH PROPOFOL;  Surgeon: Danie Binder, MD;  Location: [facility];  Service: Endoscopy;  Laterality: [name];     Home Medications    Prior to Admission medications   Medication Sig Start Date End Date Taking? Authorizing Provider  Calcium Carb-Cholecalciferol (CALCIUM 600+D) 600-800 MG-UNIT TABS Take 1 tablet by mouth 2 (two) times daily.    Yes Historical Provider, MD  folic acid (FOLVITE) 1 MG tablet Take 1 tablet (1 mg total) by mouth daily. 12/31/15  Yes Orvan Falconer, MD  furosemide (LASIX) 40 MG tablet Take 40 mg by mouth daily.   Yes Historical Provider, MD  lactulose (CHRONULAC) 10 GM/15ML solution Take 30 mLs (20 g total) by mouth 3 (three) times daily. 01/17/16  Yes Rexene Alberts, MD  magnesium oxide (MAG-OX) 400 (241.3 Mg) MG tablet Take 1 tablet (400 mg total) by mouth 2 (two) times daily. 01/17/16  Yes Rexene Alberts, MD  MILK THISTLE PO Take 1 tablet by mouth daily.   Yes Historical Provider, MD  Misc Natural Products (DANDELION ROOT PO) Take 1 tablet by mouth daily.   Yes Historical Provider, MD  nadolol (CORGARD) 40 MG tablet Take 1 tablet (40 mg total) by mouth 2 (two) times daily. 12/31/15  Yes Orvan Falconer, MD  pantoprazole (PROTONIX) 40 MG tablet Take 1 tablet (40 mg total) by mouth 2 (two) times daily before a meal.  02/08/16  Yes Danie Binder, MD  potassium chloride SA (K-DUR,KLOR-CON) 20 MEQ tablet Take 20 mEq by mouth 2 (two) times daily.   Yes Historical Provider, MD  spironolactone (ALDACTONE) 100 MG tablet Take 0.5 tablets (50 mg total) by mouth daily. Patient taking differently: Take 100 mg by mouth daily.  01/17/16  Yes Rexene Alberts, MD  thiamine 100 MG tablet Take 1 tablet (100 mg total) by mouth daily. 12/31/15  Yes Orvan Falconer, MD  torsemide (DEMADEX) 20 MG tablet Take 1 tablet (20 mg total) by mouth daily. For treatment of swelling. Patient taking  differently: Take 20 mg by mouth 2 (two) times daily. For treatment of swelling. 01/17/16  Yes Rexene Alberts, MD  rifaximin (XIFAXAN) 550 MG TABS tablet Take 1 tablet (550 mg total) by mouth 2 (two) times daily. Patient not taking: Reported on 02/08/2016 12/31/15   Orvan Falconer, MD    Family History Family History  Problem Relation Age of Onset  . Colon cancer Neg Hx   . Liver disease Neg Hx     Social History Social History  Substance Use Topics  . Smoking status: Former Smoker    Packs/day: 0.25    Types: Cigarettes  . Smokeless tobacco: Never Used  . Alcohol use No     Comment: former     Allergies   Review of patient's allergies indicates no known allergies.   Review of Systems Review of Systems  Constitutional: Negative for fever.       Per HPI, otherwise negative  HENT:       Per HPI, otherwise negative  Eyes:       Jaundice  Respiratory: Positive for shortness of breath.        Per HPI, otherwise negative  Cardiovascular: Positive for leg swelling.       Per HPI, otherwise negative  Gastrointestinal: Negative for abdominal pain and vomiting.  Endocrine:       Negative aside from HPI  Genitourinary: Positive for difficulty urinating.       Neg aside from HPI   Musculoskeletal:       Per HPI, otherwise negative  Skin: Positive for color change.  Allergic/Immunologic: Positive for immunocompromised state.  Neurological: Negative for syncope.   Physical Exam Updated Vital Signs BP (!) 112/46   Pulse (!) 121   Temp 97.6 F (36.4 C) (Oral)   Resp 18   Ht 6\' 2"  (1.88 m)   Wt (!) 331 lb (150.1 kg)   SpO2 91%   BMI 42.50 kg/m   Physical Exam  Constitutional: He is oriented to person, place, and time. He appears well-developed.  Morbidly obese male laying in gurney, speaking clearly, answering questions appropriately  HENT:  Head: Normocephalic and atraumatic.  Eyes: EOM are normal.  Notable scleral icterus  Cardiovascular: Normal rate and regular rhythm.     Pulmonary/Chest: Effort normal. No stridor. No respiratory distress.  Abdominal: He exhibits no distension. There is no tenderness. There is no guarding.  Very large abdomen, no tenderness, no guarding  Musculoskeletal: He exhibits edema.  Substantial pitting edema bilaterally, symmetric.  Neurological: He is alert and oriented to person, place, and time.  Skin: Skin is warm and dry.  Psychiatric: He has a normal mood and affect.  Nursing note and vitals reviewed.   ED Treatments / Results  Labs (all labs ordered are listed, but only abnormal results are displayed) Labs Reviewed  COMPREHENSIVE METABOLIC PANEL - Abnormal; Notable for the following:  Result Value   Sodium 128 (*)    Chloride 99 (*)    Glucose, Bld 108 (*)    BUN 53 (*)    Creatinine, Ser 3.26 (*)    Calcium 8.7 (*)    Albumin 2.3 (*)    AST 61 (*)    Alkaline Phosphatase 164 (*)    Total Bilirubin 7.0 (*)    GFR calc non Af Amer 22 (*)    GFR calc Af Amer 25 (*)    All other components within normal limits  LIPASE, BLOOD - Abnormal; Notable for the following:    Lipase 59 (*)    All other components within normal limits  BRAIN NATRIURETIC PEPTIDE - Abnormal; Notable for the following:    B Natriuretic Peptide 545.0 (*)    All other components within normal limits  CBC WITH DIFFERENTIAL/PLATELET - Abnormal; Notable for the following:    RBC 2.50 (*)    Hemoglobin 7.4 (*)    HCT 23.0 (*)    RDW 19.0 (*)    Platelets 149 (*)    Monocytes Absolute 1.4 (*)    Eosinophils Absolute 1.3 (*)    All other components within normal limits  URINALYSIS, ROUTINE W REFLEX MICROSCOPIC (NOT AT [facility]) - Abnormal; Notable for the following:    Bilirubin Urine SMALL (*)    Protein, ur 30 (*)    Leukocytes, UA TRACE (*)    All other components within normal limits  URINE MICROSCOPIC-ADD ON - Abnormal; Notable for the following:    Squamous Epithelial / LPF 0-5 (*)    Bacteria, UA FEW (*)    All other components  within normal limits     Radiology Dg Chest 2 View  Result Date: 03/06/2016 CLINICAL DATA:  Dyspnea. EXAM: CHEST  2 VIEW COMPARISON:  01/09/2016 chest radiograph. FINDINGS: Stable cardiomediastinal silhouette with mild cardiomegaly. No pneumothorax. No pleural effusion. Mild pulmonary edema. No acute consolidative airspace disease. IMPRESSION: Mild congestive heart failure. Electronically Signed   By: Ilona Sorrel M.D.   On: 03/06/2016 13:26    Procedures Procedures (including critical care time)   DIAGNOSTIC STUDIES:  Oxygen Saturation is 97% on RA, low by my interpretation.    COORDINATION OF CARE:  12:29 PM Discussed treatment plan with pt at bedside and pt agreed to plan.  3:19 PM On repeat exam the patient is awake and alert. We discussed all findings.  Labs most notable for acute kidney injury, elevated BNP. X-ray concerning for fluid overload status.  He clarifies that he had not been taking Lasix until yesterday, after he reported his edema to his team of physicians.  4:13 PM I d/w Dr. Lowanda Foster (nephrology) and he will consult. Initial Impression / Assessment and Plan / ED Course  I have reviewed the triage vital signs and the nursing notes.  Pertinent labs & imaging results that were available during my care of the patient were reviewed by me and considered in my medical decision making (see chart for details).  I personally performed the services described in this documentation, which was scribed in my presence. The recorded information has been reviewed and is accurate.   Final Clinical Impressions(s) / ED Diagnoses  Patient with history of liver failure presents with worsening edema, fatigue, dyspnea. Patient is awake and alert, but is thought to have elevated BNP, as well as acute kidney injury Given the patient's renal dysfunction, as well as evidence for congestive heart failure, he required admission, for further evaluation.  Carmin Muskrat,  MD 03/06/16 1523    Carmin Muskrat, MD 03/06/16 [phone]

## 2016-03-06 NOTE — ED Triage Notes (Signed)
On set day ago swelling in leg getting worse, some SOB today

## 2016-03-07 ENCOUNTER — Inpatient Hospital Stay (HOSPITAL_COMMUNITY): Payer: BLUE CROSS/BLUE SHIELD

## 2016-03-07 ENCOUNTER — Encounter (HOSPITAL_COMMUNITY): Payer: Self-pay | Admitting: Gastroenterology

## 2016-03-07 DIAGNOSIS — D649 Anemia, unspecified: Secondary | ICD-10-CM

## 2016-03-07 DIAGNOSIS — K7290 Hepatic failure, unspecified without coma: Principal | ICD-10-CM

## 2016-03-07 DIAGNOSIS — K625 Hemorrhage of anus and rectum: Secondary | ICD-10-CM

## 2016-03-07 LAB — SODIUM, URINE, RANDOM: Sodium, Ur: 14 mmol/L

## 2016-03-07 LAB — MRSA PCR SCREENING: MRSA by PCR: NEGATIVE

## 2016-03-07 LAB — CBC
HEMATOCRIT: 22.1 % — AB (ref 39.0–52.0)
HEMOGLOBIN: 7.2 g/dL — AB (ref 13.0–17.0)
MCH: 29.8 pg (ref 26.0–34.0)
MCHC: 32.6 g/dL (ref 30.0–36.0)
MCV: 91.3 fL (ref 78.0–100.0)
Platelets: 149 10*3/uL — ABNORMAL LOW (ref 150–400)
RBC: 2.42 MIL/uL — AB (ref 4.22–5.81)
RDW: 19 % — ABNORMAL HIGH (ref 11.5–15.5)
WBC: 10.6 10*3/uL — ABNORMAL HIGH (ref 4.0–10.5)

## 2016-03-07 LAB — BASIC METABOLIC PANEL
ANION GAP: 7 (ref 5–15)
BUN: 60 mg/dL — ABNORMAL HIGH (ref 6–20)
CO2: 21 mmol/L — AB (ref 22–32)
Calcium: 8.7 mg/dL — ABNORMAL LOW (ref 8.9–10.3)
Chloride: 101 mmol/L (ref 101–111)
Creatinine, Ser: 3.58 mg/dL — ABNORMAL HIGH (ref 0.61–1.24)
GFR calc Af Amer: 22 mL/min — ABNORMAL LOW (ref >60)
GFR, EST NON AFRICAN AMERICAN: 19 mL/min — AB (ref >60)
GLUCOSE: 104 mg/dL — AB (ref 65–99)
POTASSIUM: 4.6 mmol/L (ref 3.5–5.1)
Sodium: 129 mmol/L — ABNORMAL LOW (ref 135–145)

## 2016-03-07 LAB — CREATININE, URINE, RANDOM: Creatinine, Urine: 137.55 mg/dL

## 2016-03-07 LAB — HEMOGLOBIN AND HEMATOCRIT, BLOOD
HEMATOCRIT: 21.9 % — AB (ref 39.0–52.0)
HEMATOCRIT: 23.3 % — AB (ref 39.0–52.0)
HEMOGLOBIN: 7.2 g/dL — AB (ref 13.0–17.0)
HEMOGLOBIN: 7.8 g/dL — AB (ref 13.0–17.0)

## 2016-03-07 LAB — PREPARE RBC (CROSSMATCH)

## 2016-03-07 MED ORDER — HEPARIN (PORCINE) IN NACL 100-0.45 UNIT/ML-% IJ SOLN: 1450.0000 [IU]/h | INTRAMUSCULAR | Status: DC

## 2016-03-07 MED ORDER — ENOXAPARIN SODIUM 80 MG/0.8ML ~~LOC~~ SOLN: 80.0000 mg | SUBCUTANEOUS | Status: DC

## 2016-03-07 MED ORDER — PEG 3350-KCL-NABCB-NACL-NASULF 236 G PO SOLR
2000.0000 mL | Freq: Once | ORAL | Status: AC
  Administered 2016-03-08: 2000 mL via ORAL

## 2016-03-07 MED ORDER — HEPARIN BOLUS VIA INFUSION
3000.0000 [IU] | Freq: Once | INTRAVENOUS | Status: DC
  Filled 2016-03-07: qty 3000

## 2016-03-07 MED ORDER — SODIUM CHLORIDE 0.9 % IV SOLN
Freq: Once | INTRAVENOUS | Status: AC
  Administered 2016-03-07: 15:00:00 via INTRAVENOUS

## 2016-03-07 MED ORDER — BISACODYL 5 MG PO TBEC
10.0000 mg | DELAYED_RELEASE_TABLET | Freq: Once | ORAL | Status: AC
  Administered 2016-03-07: 10 mg via ORAL
  Filled 2016-03-07: qty 2

## 2016-03-07 MED ORDER — VITAMIN K1 10 MG/ML IJ SOLN
10.0000 mg | Freq: Once | INTRAMUSCULAR | Status: AC
  Administered 2016-03-07: 10 mg via SUBCUTANEOUS
  Filled 2016-03-07: qty 1

## 2016-03-07 MED ORDER — PRO-STAT SUGAR FREE PO LIQD
45.0000 mL | Freq: Three times a day (TID) | ORAL | Status: DC
  Administered 2016-03-07 – 2016-03-13 (×11): 45 mL via ORAL
  Administered 2016-03-14: 30 mL via ORAL
  Administered 2016-03-14: 45 mL via ORAL
  Administered 2016-03-14: 30 mL via ORAL
  Administered 2016-03-15 – 2016-03-23 (×22): 45 mL via ORAL
  Filled 2016-03-07 (×39): qty 60

## 2016-03-07 MED ORDER — DEXTROSE 5 % IV SOLN
1.0000 g | INTRAVENOUS | Status: AC
  Administered 2016-03-07 – 2016-03-13 (×7): 1 g via INTRAVENOUS
  Filled 2016-03-07 (×7): qty 10

## 2016-03-07 MED ORDER — PEG 3350-KCL-NABCB-NACL-NASULF 236 G PO SOLR
2000.0000 mL | Freq: Once | ORAL | Status: AC
Start: 2016-03-07 — End: 2016-03-07
  Administered 2016-03-07: 2000 mL via ORAL
  Filled 2016-03-07: qty 4000

## 2016-03-07 NOTE — Progress Notes (Signed)
Antonio [name]  MRN: [id number]  DOB/AGE: Feb 10, 1971 44 y.o.  Primary Care Physician:TAPPER,DAVID B, MD  Admit date: 03/06/2016  Chief Complaint:  Chief Complaint  Patient presents with  . Leg Swelling    S-Pt presented on  03/06/2016 with  Chief Complaint  Patient presents with  . Leg Swelling  .    Pt says " I think I had black stools "   . docusate sodium  100 mg Oral BID  . enoxaparin (LOVENOX) injection  80 mg Subcutaneous Q24H  . folic acid  1 mg Oral Daily  . furosemide  160 mg Intravenous BID  . lactulose  20 g Oral TID  . magnesium oxide  400 mg Oral BID  . pantoprazole  40 mg Oral BID AC  . rifaximin  550 mg Oral BID  . sodium chloride flush  3 mL Intravenous Q12H  . thiamine  100 mg Oral Daily      Physical Exam: Vital signs in last 24 hours: Temp:  [97 F (36.1 C)-97.6 F (36.4 C)] 97 F (36.1 C) (10/11 0438) Pulse Rate:  [52-121] 56 (10/11 0200) Resp:  [13-20] 15 (10/11 0200) BP: (95-117)/(36-63) 97/46 (10/11 0200) SpO2:  [91 %-100 %] 100 % (10/11 0200) Weight:  [331 lb (150.1 kg)-366 lb 10 oz (166.3 kg)] 366 lb 10 oz (166.3 kg) (10/11 0500) Weight change:  Last BM Date: 03/05/16  Intake/Output from previous day: 10/10 0701 - 10/11 0700 In: 306 [P.O.:240; IV Piggyback:66] Out: 901 [Urine:900; Stool:1] Total I/O In: 240 [P.O.:240] Out: 901 [Urine:900; Stool:1]   Physical Exam: General- pt is awake,alert, follows coomands Resp- No acute REsp distress, Rhonchi + CVS- S1S2 regular in rate and rhythm GIT- BS+, soft, distended, morbidly obese EXT- 2+ LE Edema, No Cyanosis   Lab Results: CBC  Recent Labs  03/06/16 1300  WBC 10.2  HGB 7.4*  HCT 23.0*  PLT 149*    BMET  Recent Labs  03/06/16 1300  NA 128*  K 4.7  CL 99*  CO2 22  GLUCOSE 108*  BUN 53*  CREATININE 3.26*  CALCIUM 8.7*   Creat trend 2017   3.26 2017  1.2--2.0( august admission)     MICRO No results found for this or any previous visit (from the past  240 hour(s)).    Lab Results  Component Value Date   CALCIUM 8.7 (L) 03/06/2016   PHOS 4.4 01/17/2016         Impression: 1)Renal  AKI secondary to Prerenal/ATN                AKI possibly Hepatorenal                2)HTN  BP stable  Medication- On Diuretics-  3)Anemia HGb trending low 9.4==>7.2 Sec to GI bleeding Primary team  following  4)Anasarca- on Diuretics  5)Thrombocytopenia Sec to Liver related issues Primary MD and heam follwoing following  6)Electrolytes  Normokalemic  Hyponatremic     Hypervolemia hyponatremia  7)Acid base Co2 at goal     Plan:  Will continue current care. Will ask for Urine Na and Creat. Pt will most likely need PRBC. I had extensive discussion with pt about his kidney issues.      Yuma Blucher S 03/07/2016, 6:05 AM

## 2016-03-07 NOTE — Progress Notes (Signed)
PROGRESS NOTE    Antonio [name]  Antonio [name] DOB: 02-Oct-1970 DOA: 03/06/2016 PCP: Deloria Lair, MD    Brief Narrative:  45 y.o. [name] with medical history significant of ESLD and CKD with morbid obesity.  He presents today for worsening LE edema and anuria. In the outpatient setting, patient was given an increased dose of diuretic with little to no improvement. Patient was found to have worsening renal function on presentation and was admitted for management of volume overload.  Assessment & Plan:   Principal Problem:   Decompensation of cirrhosis of liver (HCC) Active Problems:   Anemia   Anasarca   Thrombocytopenia (HCC)   Acute renal failure (HCC)   Bilateral edema of lower extremity   Hyponatremia   Hyperglycemia   Protein-calorie malnutrition, severe (HCC)   CHF (congestive heart failure) (HCC)   Rectal bleeding   Decompensated cirrhosis of liver -Calculated meld score of 35 which correlates to a 30% 3 months survival -Patient initially claimed to be on the transplant list. However, records were reviewed and patient is still pending approval for initiating transplant process -Patient is alert and oriented this morning, no signs of encephalopathy -Given worsening renal function, concerns are for questionable hepatorenal syndrome -Patient is continued on lactulose and rifaximin -We will repeat comprehensive metabolic panel in morning  ARF -Patient oliguric -Creatinine has slightly worsened from 3.26 to 3.58 -Nephrology consulted, discussed case with nephrology -Recommendation for continued diuretic as tolerated -Repeat comprehensive metabolic panel in morning  Hyponatremia -Likely secondary to volume overload in the setting of liver disease -Sodium has remained stable overnight -Continue IV Lasix diuretics -Repeat comprehensive metabolic panel morning  Acutely decompensated chronic CHF -Per above, patient is continued on IV Lasix -Patient does have signs of  volume overload  Suspected acute blood loss Anemia from possible upper GI bleeding -Hemoglobin previously 9.4, down to the low 7 range -Patient reports black tarry stools -Presently hemodynamically stable -Have ordered 1 unit of packed red blood cells -I have consulted gastroenterology. Put appreciated, recommendations for EGD and colonoscopy -We'll continue to follow serial CBC, continue to transfuse as needed -Agree with discontinuing Lovenox, will continue SCDs for prophylaxis  Thrombocytopenia -Suspect secondary to liver disease -Per above, Lovenox has been discontinued  Hyperglycemia -We'll follow comprehensive metabolic panel for now  Malnutrition -Nutrition consulted  DVT prophylaxis: SCDs, Lovenox discontinued Code Status: Full code Family Communication: Patient in room, family not at bedside Disposition Plan: Uncertain at this time  Consultants:   Gastroenterology  Nephrology  Procedures:     Antimicrobials: Anti-infectives    Start     Dose/Rate Route Frequency Ordered Stop   03/07/16 1400  cefTRIAXone (ROCEPHIN) 1 g in dextrose 5 % 50 mL IVPB    Comments:  Please dose adjust if needed for renal insufficency   1 g 100 mL/hr over 30 Minutes Intravenous Every 24 hours 03/07/16 1339     03/06/16 1800  rifaximin (XIFAXAN) tablet 550 mg     550 mg Oral 2 times daily 03/06/16 1734        Subjective: No complaints at this time  Objective: Vitals:   03/07/16 1445 03/07/16 1600 03/07/16 1637 03/07/16 1700  BP: (!) 115/56 (!) 121/53  (!) 120/56  Pulse: 63 63 62 68  Resp: 13 13 17  (!) 26  Temp: (!) 96.9 F (36.1 C)     TempSrc: Axillary     SpO2: 100% 100% 100% 100%  Weight:      Height:  Intake/Output Summary (Last 24 hours) at 03/07/16 1758 Last data filed at 03/07/16 1747  Gross per 24 hour  Intake          1454.67 ml  Output             2401 ml  Net          -946.33 ml   Filed Weights   03/06/16 0959 03/06/16 1737 03/07/16 0500    Weight: (!) 150.1 kg (331 lb) (!) 164.8 kg (363 lb 5.1 oz) (!) 166.3 kg (366 lb 10 oz)    Examination:  General exam: Appears calm and comfortable  Respiratory system: Clear to auscultation. Respiratory effort normal. Cardiovascular system: S1 & S2 heard, RRR. Gastrointestinal system: Abdomen is nondistended, soft and nontender. No organomegaly or masses felt. Normal bowel sounds heard. Central nervous system: Alert and oriented. No focal neurological deficits. Extremities: Symmetric 5 x 5 power-oma no clubbing. Skin: No rashes, lesions, appears jaundiced  Psychiatry: Judgement and insight appear normal. Mood & affect appropriate.   Data Reviewed: I have personally reviewed following labs and imaging studies  CBC:  Recent Labs Lab 03/06/16 1300 03/07/16 0604 03/07/16 1318  WBC 10.2 10.6*  --   NEUTROABS 6.1  --   --   HGB 7.4* 7.2* 7.2*  HCT Antonio.0* 22.1* 21.9*  MCV 92.0 91.3  --   PLT 149* 149*  --    Basic Metabolic Panel:  Recent Labs Lab 03/06/16 1300 03/07/16 0604  NA 128* 129*  K 4.7 4.6  CL 99* 101  CO2 22 21*  GLUCOSE 108* 104*  BUN 53* 60*  CREATININE 3.26* 3.58*  CALCIUM 8.7* 8.7*   GFR: Estimated Creatinine Clearance: 43.6 mL/min (by C-G formula based on SCr of 3.58 mg/dL (H)). Liver Function Tests:  Recent Labs Lab 03/06/16 1300  AST 61*  ALT Antonio  ALKPHOS 164*  BILITOT 7.0*  PROT 7.6  ALBUMIN 2.3*    Recent Labs Lab 03/06/16 1300  LIPASE 59*    Recent Labs Lab 03/06/16 1732  AMMONIA 129*   Coagulation Profile:  Recent Labs Lab 03/06/16 1300  INR 2.18   Cardiac Enzymes: No results for input(s): CKTOTAL, CKMB, CKMBINDEX, TROPONINI in the last 168 hours. BNP (last 3 results) No results for input(s): PROBNP in the last 8760 hours. HbA1C: No results for input(s): HGBA1C in the last 72 hours. CBG: No results for input(s): GLUCAP in the last 168 hours. Lipid Profile: No results for input(s): CHOL, HDL, LDLCALC, TRIG, CHOLHDL,  LDLDIRECT in the last 72 hours. Thyroid Function Tests: No results for input(s): TSH, T4TOTAL, FREET4, T3FREE, THYROIDAB in the last 72 hours. Anemia Panel: No results for input(s): VITAMINB12, FOLATE, FERRITIN, TIBC, IRON, RETICCTPCT in the last 72 hours. Sepsis Labs: No results for input(s): PROCALCITON, LATICACIDVEN in the last 168 hours.  Recent Results (from the past 240 hour(s))  MRSA PCR Screening     Status: None   Collection Time: 03/06/16  6:00 PM  Result Value Ref Range Status   MRSA by PCR NEGATIVE NEGATIVE Final    Comment:        The GeneXpert MRSA Assay (FDA approved for NASAL specimens only), is one component of a comprehensive MRSA colonization surveillance program. It is not intended to diagnose MRSA infection nor to guide or monitor treatment for MRSA infections.      Radiology Studies: Dg Chest 2 View  Result Date: 03/06/2016 CLINICAL DATA:  Dyspnea. EXAM: CHEST  2 VIEW COMPARISON:  01/09/2016 chest radiograph.  FINDINGS: Stable cardiomediastinal silhouette with mild cardiomegaly. No pneumothorax. No pleural effusion. Mild pulmonary edema. No acute consolidative airspace disease. IMPRESSION: Mild congestive heart failure. Electronically Signed   By: Ilona Sorrel M.D.   On: 03/06/2016 13:26   US Renal  Result Date: 03/07/2016 CLINICAL DATA:  Recently diagnosed with acute tubular necrosis. History of CHF, alcoholic hepatitis and cirrhosis. EXAM: RENAL / URINARY TRACT ULTRASOUND COMPLETE COMPARISON:  None in PACs FINDINGS: Right Kidney: Length: 13.3 cm. The renal cortical echotexture is approximately equal to that of the adjacent liver. There is no cortical atrophy. There is no hydronephrosis. No stones are evident. Left Kidney: Length: 12.8 cm. The renal cortical echotexture is similar to that on the right. There is no hydronephrosis. No cortical atrophy. No evidence of stones. Bladder: The urinary bladder is decompressed with a Foley catheter. IMPRESSION: Slightly  increased renal cortical echotexture as compared to the liver though this may be spurring this given the patient's known cirrhosis. No evidence of cortical atrophy or obstruction. Nondistended urinary bladder with Foley catheter present. Electronically Signed   By: David  Martinique M.D.   On: 03/07/2016 11:36    Scheduled Meds: . cefTRIAXone (ROCEPHIN)  IV  1 g Intravenous Q24H  . docusate sodium  100 mg Oral BID  . feeding supplement (PRO-STAT SUGAR FREE 64)  45 mL Oral TID  . folic acid  1 mg Oral Daily  . furosemide  160 mg Intravenous BID  . lactulose  20 g Oral TID  . magnesium oxide  400 mg Oral BID  . pantoprazole  40 mg Oral BID AC  . [START ON 03/08/2016] polyethylene glycol  2,000 mL Oral Once  . rifaximin  550 mg Oral BID  . sodium chloride flush  3 mL Intravenous Q12H  . thiamine  100 mg Oral Daily   Continuous Infusions:    LOS: 1 day   Imari Sivertsen, Orpah Melter, MD [facility] Pager [phone]  If 7PM-7AM, please contact night-coverage [email or web address] Password [email or web address] 03/07/2016, 5:58 PM

## 2016-03-07 NOTE — Consult Note (Signed)
Referring Provider: Donne Hazel, MD Primary Care Physician:  Deloria Lair, MD Primary Gastroenterologist:  [name], MD  Reason for Consultation:  UGI bleed  HPI: Antonio [name] is a 45 y.o. 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   Patient was hospitalized back in August with decompensated alcoholic cirrhosis of liver with ascites, anasarca, hepatic encephalopathy, acute renal failure. Fortunately he was able to stop drinking. He was seen in our office on September 13, weight was down over 50 pounds with diuresis. He has been on lactulose, Xifaxan, nadolol, Aldactone, Protonix from our office. Because a significant improvement, abstinence from alcohol, referral was made for liver transplant evaluation at [facility]. He has not been seen yet.  Patient reports some dyspnea. 2 days of significant swelling. Has been unable to urinate until yesterday. We will reconsult to because the patient reported possible black stool yesterday. He states it occurred early this morning. He denies any fresh blood per rectum. Denies abdominal pain. Appetite is been good. He ate breakfast this morning. No vomiting, heartburn. No fever or chills. Denies confusion.  Hemoglobin at time of discharge on 01/17/2016 was 8.8. Hemoglobin on 02/08/2016 was 9.4. Yesterday on admission his hemoglobin was 7.4, stable today. INR 2.18, was 1.6 on February 08, 2016.    Prior to Admission medications   Medication Sig Start Date End Date Taking? Authorizing Provider  Calcium Carb-Cholecalciferol (CALCIUM 600+D) 600-800 MG-UNIT TABS Take 1 tablet by mouth 2 (two) times daily.    Yes Historical Provider, MD  folic acid (FOLVITE) 1 MG tablet Take 1 tablet (1 mg total) by mouth daily. 12/31/15  Yes Orvan Falconer, MD  furosemide (LASIX) 40 MG tablet  Take 40 mg by mouth daily.   Yes Historical Provider, MD  lactulose (CHRONULAC) 10 GM/15ML solution Take 30 mLs (20 g total) by mouth 3 (three) times daily. 01/17/16  Yes Rexene Alberts, MD  magnesium oxide (MAG-OX) 400 (241.3 Mg) MG tablet Take 1 tablet (400 mg total) by mouth 2 (two) times daily. 01/17/16  Yes Rexene Alberts, MD  MILK THISTLE PO Take 1 tablet by mouth daily.   Yes Historical Provider, MD  Misc Natural Products (DANDELION ROOT PO) Take 1 tablet by mouth daily.   Yes Historical Provider, MD  nadolol (CORGARD) 40 MG tablet Take 1 tablet (40 mg total) by mouth 2 (two) times daily. 12/31/15  Yes Orvan Falconer, MD  pantoprazole (PROTONIX) 40 MG tablet Take 1 tablet (40 mg total) by mouth 2 (two) times daily before a meal. 02/08/16  Yes Danie Binder, MD  potassium chloride SA (K-DUR,KLOR-CON) 20 MEQ tablet Take 20 mEq by mouth 2 (two) times daily.   Yes Historical Provider, MD  spironolactone (ALDACTONE) 100 MG tablet Take 0.5 tablets (50 mg total) by mouth daily. Patient taking differently: Take 100 mg by mouth daily.  01/17/16  Yes Rexene Alberts, MD  thiamine 100 MG tablet Take 1 tablet (100 mg total) by mouth daily. 12/31/15  Yes Orvan Falconer, MD  torsemide (DEMADEX) 20 MG tablet Take 1 tablet (20 mg total) by mouth daily. For treatment of swelling. Patient taking differently: Take 20 mg by mouth 2 (two) times daily. For treatment of swelling. 01/17/16  Yes Rexene Alberts, MD  rifaximin (XIFAXAN) 550 MG TABS tablet Take 1 tablet (550 mg total) by mouth 2 (two) times  daily. Patient not taking: Reported on 02/08/2016 12/31/15   Orvan Falconer, MD    Current Facility-Administered Medications  Medication Dose Route Frequency Provider Last Rate Last Dose  . 0.9 %  sodium chloride infusion   Intravenous Once Donne Hazel, MD      . acetaminophen (TYLENOL) tablet 650 mg  650 mg Oral Q6H PRN Karmen Bongo, MD       Or  . acetaminophen (TYLENOL) suppository 650 mg  650 mg Rectal Q6H PRN Karmen Bongo, MD      .  docusate sodium (COLACE) capsule 100 mg  100 mg Oral BID Karmen Bongo, MD   100 mg at 03/06/16 2230  . folic acid (FOLVITE) tablet 1 mg  1 mg Oral Daily Karmen Bongo, MD   1 mg at 03/06/16 1755  . furosemide (LASIX) 160 mg in dextrose 5 % 50 mL IVPB  160 mg Intravenous BID Fran Lowes, MD   160 mg at 03/06/16 1849  . lactulose (CHRONULAC) 10 GM/15ML solution 20 g  20 g Oral TID Karmen Bongo, MD   20 g at 03/06/16 2230  . magnesium oxide (MAG-OX) tablet 400 mg  400 mg Oral BID Karmen Bongo, MD   400 mg at 03/06/16 2300  . ondansetron (ZOFRAN) tablet 4 mg  4 mg Oral Q6H PRN Karmen Bongo, MD       Or  . ondansetron [facility]) injection 4 mg  4 mg Intravenous Q6H PRN Karmen Bongo, MD      . pantoprazole (PROTONIX) EC tablet 40 mg  40 mg Oral BID AC Karmen Bongo, MD   40 mg at 03/07/16 0901  . rifaximin (XIFAXAN) tablet 550 mg  550 mg Oral BID Karmen Bongo, MD   550 mg at 03/07/16 0901  . sodium chloride flush (NS) 0.9 % injection 3 mL  3 mL Intravenous Q12H Karmen Bongo, MD   3 mL at 03/06/16 2300  . thiamine (VITAMIN B-1) tablet 100 mg  100 mg Oral Daily Karmen Bongo, MD   100 mg at 03/06/16 1755    Allergies as of 03/06/2016  . (No Known Allergies)    Past Medical History:  Diagnosis Date  . Alcohol abuse   . Alcoholic hepatitis   . Cirrhosis with alcoholism [facility])     Past Surgical History:  Procedure Laterality Date  . Colonoscopy     per patient around 2014 in [place]   . ESOPHAGOGASTRODUODENOSCOPY (EGD) WITH PROPOFOL [name] 12/28/2015   Dr. Oneida Alar: 4 columns of esophageal varices, 3 grade 1, 1 grade 2 with no evidence of bleeding. Mild portal gastropathy with no evidence of active bleeding.    Family History  Problem Relation Age of Onset  . Colon cancer Neg Hx   . Liver disease Neg Hx     Social History   Social History  . Marital status: Married    Spouse name: [name]  . Number of children: [name]  . Years of education: [name]   Occupational History  . Not on  file.   Social History Main Topics  . Smoking status: Former Smoker    Packs/day: 0.25    Types: Cigarettes    Quit date: 2016  . Smokeless tobacco: Never Used  . Alcohol use No     Comment: former--quit after 12/2015 hospitalization  . Drug use: No  . Sexual activity: Yes   Other Topics Concern  . Not on file   Social History Narrative  . No narrative on file  ROS:  General: Negative for anorexia, weight loss, fever, chills, fatigue, weakness. Eyes: Negative for vision changes.  ENT: Negative for hoarseness, difficulty swallowing , nasal congestion. CV: Negative for chest pain, angina, palpitations, See history of present illness  Respiratory: Negative for dyspnea at rest, cough, sputum, wheezing. See history of present illness GI: See history of present illness. GU:  Negative for dysuria, hematuria, urinary incontinence, urinary frequency, nocturnal urination.  MS: Negative for joint pain, low back pain.  Derm: Negative for rash or itching.  Neuro: Negative for weakness, abnormal sensation, seizure, frequent headaches, memory loss, confusion.  Psych: Negative for anxiety, depression, suicidal ideation, hallucinations.  Endo: Negative for unusual weight change.  Heme: Negative for bruising or bleeding. Allergy: Negative for rash or hives.       Physical Examination: Vital signs in last 24 hours: Temp:  [97 F (36.1 C)-97.5 F (36.4 C)] 97.2 F (36.2 C) (10/11 0730) Pulse Rate:  [52-121] 65 (10/11 1000) Resp:  [12-20] 12 (10/11 1000) BP: (95-117)/(36-63) 107/59 (10/11 1000) SpO2:  [91 %-100 %] 100 % (10/11 1000) Weight:  [363 lb 5.1 oz (164.8 kg)-366 lb 10 oz (166.3 kg)] 366 lb 10 oz (166.3 kg) (10/11 0500) Last BM Date: 03/05/16  General: Well-nourished, well-developed in no acute distress.  Head: Normocephalic, atraumatic.   Eyes: Conjunctiva pink, no icterus. Mouth: Oropharyngeal mucosa moist and pink , no lesions erythema or exudate. Neck: Supple without  thyromegaly, masses, or lymphadenopathy.  Lungs: Clear to auscultation bilaterally.  Heart: Regular rate and rhythm, no murmurs rubs or gallops.  Abdomen: Bowel sounds are normal, nontender, nondistended, no hepatosplenomegaly or masses, no abdominal bruits or    hernia , no rebound or guarding.  Subcutaneous edema in the dependent areas Rectal: Not performed Extremities: 3+ pitting edema to the thighs bilaterally. No clubbing, deformity.  Neuro: Alert and oriented x 4 , grossly normal neurologically.  Skin: Warm and dry, no rash or jaundice.   Psych: Alert and cooperative, normal mood and affect.        Intake/Output from previous day: 10/10 0701 - 10/11 0700 In: 306 [P.O.:240; IV Piggyback:66] Out: 901 [Urine:900; Stool:1] Intake/Output this shift: No intake/output data recorded.  Lab Results: CBC  Recent Labs  03/06/16 1300 03/07/16 0604  WBC 10.2 10.6*  HGB 7.4* 7.2*  HCT 23.0* 22.1*  MCV 92.0 91.3  PLT 149* 149*   BMET  Recent Labs  03/06/16 1300 03/07/16 0604  NA 128* 129*  K 4.7 4.6  CL 99* 101  CO2 22 21*  GLUCOSE 108* 104*  BUN 53* 60*  CREATININE 3.26* 3.58*  CALCIUM 8.7* 8.7*   LFT  Recent Labs  03/06/16 1300  BILITOT 7.0*  ALKPHOS 164*  AST 61*  ALT 23  PROT 7.6  ALBUMIN 2.3*    Lipase  Recent Labs  03/06/16 1300  LIPASE 59*    PT/INR  Recent Labs  03/06/16 1300  LABPROT 24.6*  INR 2.18      Imaging Studies: Dg Chest 2 View  Result Date: 03/06/2016 CLINICAL DATA:  Dyspnea. EXAM: CHEST  2 VIEW COMPARISON:  01/09/2016 chest radiograph. FINDINGS: Stable cardiomediastinal silhouette with mild cardiomegaly. No pneumothorax. No pleural effusion. Mild pulmonary edema. No acute consolidative airspace disease. IMPRESSION: Mild congestive heart failure. Electronically Signed   By: Ilona Sorrel M.D.   On: 03/06/2016 13:26  [4 week]   Impression: 45 year old gentleman with history of alcoholic cirrhosis who presents with complaints  of anasarca, anuria. Patient seen on 02/08/2016 as  an outpatient. Had been abstinent from alcohol for 6 weeks at that time. He had marked improvement in his biochemical markers with creatinine 1.41, sodium 134, INR 1.6, total bilirubin 4.2. Presents now with increasing creatinine, hyponatremia, rising bilirubin and INR, would be concerned for relapse with alcohol use although patient Adamantly denies.   We have been consulted regarding questionable melena and decline in hemoglobin from September to now of approximately 2 g/dL. patient denies Pepto-Bismol, aspirin or NSAIDs. There is been no hematemesis. No abdominal pain. Issues regarding anasarca as outlined. No reported NSAID or aspirin use. Recent EGD with evidence of portal gastropathy and grade 1 and 2 esophageal varices either were actively bleeding during recent hospitalization. Patient is on nadolol. No prior colonoscopy.  MELD 36  Plan: 1. Single dose of Vitamin K  2. Continue PPI. 3. Clear liquid diet.  4. Monitor for evidence of bleeding.   We would like to thank you for the opportunity to participate in the care of Antonio [name].  Laureen Ochs. Bernarda Caffey [facility] [phone] 10/11/20171:04 PM     LOS: 1 day     Addendum: Received call from [facility], ICU RN, regarding patient. This afternoon patient went to use the restroom, past red blood per rectum. Noted in the toilet. Stool was not seen. Vitals have been stable. Patient also noted to have blood clot around his Foley catheter which was draining amber/T-colored urine. Personally spoke to patient, he denies any recent issues with bleeding until today. Denies abdominal pain or rectal pain. Doubt rapid transit UGI bleeding.   Recheck hemoglobin now. Plan for colonoscopy +/- EGD tomorrow. Start Rocephin for prophylaxis.   Laureen Ochs. Bernarda Caffey [facility] [phone] 10/11/20171:12 PM

## 2016-03-07 NOTE — Progress Notes (Signed)
Initial Nutrition Assessment  DOCUMENTATION CODES:  Morbid obesity  INTERVENTION:  Given pt's anthropometrics, age comorbidities and current condition, pt has excessive energy/pro requirements that cannot be met on CL diet. Recommend diet advancement as soon as able.   Once diet advanced, Will order High protein snacks in between meals.   Will order 45 mL Prostat TID, each supplement provides 150 kcal and 23 grams of protein.   Recommend Zinc, Vitamin D supplementation  NUTRITION DIAGNOSIS:  Increased nutrient needs related to chronic illness (Cirrhosis) as evidenced by estimated nutritional requirements for this disease state  GOAL:  Patient will meet greater than or equal to 90% of their needs  MONITOR:  PO intake, Supplement acceptance, Diet advancement, Labs, Weight trends, I & O's  REASON FOR ASSESSMENT:  Consult Assessment of nutrition requirement/status  ASSESSMENT:  45 y/o male PMHx Cirrhosis, CKD, morbid obesity. Presented for worsening edema and anuria. Had recent hospitalization for decompensated cirrhosis (D/C 8/22). Admitted again for decompensated cirrhosis and ARF.  Pt is lethargic on RD arrival. He is slightly difficult to understand and is a poor historian. He states he was eating "pretty good" He was eating 2-3 meals a day. However, he said sometimes he wouldn't eat anything; it all depended on how his appetite was. He was not drinking any type of supplement. He believes he was taking a vitamin, but cannot remember the name of it. He says someone is going to prescribe him a vitamin.   RD asked about any diet restrictions he followed at home. He states he did watch his salt, but believes he drank too much fluid. He says he ate a lot of fruit.   Denies n/v/c. He would have 2 BMs a day. He was taking lactulose  RD emphasized the importance of frequent protein and meal intake with cirrhosis, even if he doesn't have an appetite. He was agreeable to snacks in between  meals. Noted some items that he said he would not mind eating.   Pt was admitted in August at weight of 357 lbs. This admission, he was admitted at 331 lbs. Has gained large amount of weight in last 2 years. Was 265 lbs 1/16: when diagnosed with cirrhosis  NFPE: Morbidly obese, edema  Medications: Lactulose, folate, colace, lasix, mag ox, colace IVF, PPi, Abx, Thiamin,  Labs: Hemoglobin: 7.2, NA: 129, NH3: 129, Albumin: 2.3, BNP:545   Recent Labs Lab 03/06/16 1300 03/07/16 0604  NA 128* 129*  K 4.7 4.6  CL 99* 101  CO2 22 21*  BUN 53* 60*  CREATININE 3.26* 3.58*  CALCIUM 8.7* 8.7*  GLUCOSE 108* 104*    Diet Order:  Diet clear liquid Room service appropriate? Yes; Fluid consistency: Thin Diet NPO time specified  Skin:  Reviewed, no issues  Last BM:  10/11  Height:  Ht Readings from Last 1 Encounters:  03/06/16 _0  (1.88 m)   Weight:  Wt Readings from Last 1 Encounters:  03/07/16 (!) 366 lb 10 oz (166.3 kg)   Wt Readings from Last 10 Encounters:  03/07/16 (!) 366 lb 10 oz (166.3 kg)  02/08/16 (!) 337 lb 6.4 oz (153 kg)  01/17/16 (!) 389 lb 9.6 oz (176.7 kg)  12/28/15 (!) 359 lb 2.1 oz (162.9 kg)  Admit weight was 331 lbs  Ideal Body Weight:  86.36 kg  BMI:  Body mass index using dry weight is 42.6 kg/m.  Estimated Nutritional Needs:  Kcal:  >2650 kcals (1.4 x Harris Benedict Eq)  Protein:  120-140  g (1.4-1.6 g/kg IBW) Fluid:  <1.5 liters  EDUCATION NEEDS:  Education needs no appropriate at this time  Burtis Junes RD, LDN, [facility] Pager: [phone] 03/07/2016 1:50 PM

## 2016-03-08 ENCOUNTER — Encounter (HOSPITAL_COMMUNITY)
Admission: EM | Disposition: A | Discharge status: Home / Self Care | Payer: Self-pay | Source: Home / Self Care | Attending: Internal Medicine

## 2016-03-08 ENCOUNTER — Encounter (HOSPITAL_COMMUNITY): Payer: Self-pay

## 2016-03-08 ENCOUNTER — Inpatient Hospital Stay (HOSPITAL_COMMUNITY): Payer: BLUE CROSS/BLUE SHIELD | Admitting: Anesthesiology

## 2016-03-08 DIAGNOSIS — D120 Benign neoplasm of cecum: Secondary | ICD-10-CM

## 2016-03-08 DIAGNOSIS — D124 Benign neoplasm of descending colon: Secondary | ICD-10-CM

## 2016-03-08 DIAGNOSIS — K3189 Other diseases of stomach and duodenum: Secondary | ICD-10-CM

## 2016-03-08 DIAGNOSIS — K921 Melena: Secondary | ICD-10-CM

## 2016-03-08 DIAGNOSIS — K766 Portal hypertension: Secondary | ICD-10-CM

## 2016-03-08 DIAGNOSIS — I8500 Esophageal varices without bleeding: Secondary | ICD-10-CM

## 2016-03-08 HISTORY — PX: COLONOSCOPY WITH PROPOFOL: SHX5780

## 2016-03-08 HISTORY — PX: BIOPSY: SHX5522

## 2016-03-08 HISTORY — PX: ESOPHAGOGASTRODUODENOSCOPY (EGD) WITH PROPOFOL: SHX5813

## 2016-03-08 HISTORY — PX: POLYPECTOMY: SHX5525

## 2016-03-08 LAB — COMPREHENSIVE METABOLIC PANEL
ALT: 21 U/L (ref 17–63)
AST: 56 U/L — AB (ref 15–41)
Albumin: 2.2 g/dL — ABNORMAL LOW (ref 3.5–5.0)
Alkaline Phosphatase: 141 U/L — ABNORMAL HIGH (ref 38–126)
Anion gap: 8 (ref 5–15)
BUN: 55 mg/dL — AB (ref 6–20)
CHLORIDE: 99 mmol/L — AB (ref 101–111)
CO2: 23 mmol/L (ref 22–32)
Calcium: 8.7 mg/dL — ABNORMAL LOW (ref 8.9–10.3)
Creatinine, Ser: 2.52 mg/dL — ABNORMAL HIGH (ref 0.61–1.24)
GFR, EST AFRICAN AMERICAN: 34 mL/min — AB (ref >60)
GFR, EST NON AFRICAN AMERICAN: 30 mL/min — AB (ref >60)
Glucose, Bld: 91 mg/dL (ref 65–99)
POTASSIUM: 3.5 mmol/L (ref 3.5–5.1)
SODIUM: 130 mmol/L — AB (ref 135–145)
Total Bilirubin: 7 mg/dL — ABNORMAL HIGH (ref 0.3–1.2)
Total Protein: 7.2 g/dL (ref 6.5–8.1)

## 2016-03-08 LAB — CBC
HCT: 22.9 % — ABNORMAL LOW (ref 39.0–52.0)
Hemoglobin: 7.6 g/dL — ABNORMAL LOW (ref 13.0–17.0)
MCH: 30.2 pg (ref 26.0–34.0)
MCHC: 33.2 g/dL (ref 30.0–36.0)
MCV: 90.9 fL (ref 78.0–100.0)
PLATELETS: 144 10*3/uL — AB (ref 150–400)
RBC: 2.52 MIL/uL — AB (ref 4.22–5.81)
RDW: 18.7 % — AB (ref 11.5–15.5)
WBC: 11.1 10*3/uL — AB (ref 4.0–10.5)

## 2016-03-08 LAB — PROTIME-INR
INR: 2.18
PROTHROMBIN TIME: 24.6 s — AB (ref 11.4–15.2)

## 2016-03-08 SURGERY — COLONOSCOPY WITH PROPOFOL
Anesthesia: Monitor Anesthesia Care

## 2016-03-08 MED ORDER — FENTANYL CITRATE (PF) 100 MCG/2ML IJ SOLN
25.0000 ug | INTRAMUSCULAR | Status: AC | PRN
Start: 2016-03-08 — End: 2016-03-08
  Administered 2016-03-08 (×2): 25 ug via INTRAVENOUS

## 2016-03-08 MED ORDER — PROPOFOL 500 MG/50ML IV EMUL
INTRAVENOUS | Status: DC | PRN
Start: 2016-03-08 — End: 2016-03-08
  Administered 2016-03-08: 90 ug/kg/min via INTRAVENOUS
  Administered 2016-03-08 (×2): 75 ug/kg/min via INTRAVENOUS

## 2016-03-08 MED ORDER — LIDOCAINE HCL (CARDIAC) 10 MG/ML IV SOLN
INTRAVENOUS | Status: DC | PRN
  Administered 2016-03-08: 30 mg via INTRAVENOUS

## 2016-03-08 MED ORDER — MIDAZOLAM HCL 5 MG/5ML IJ SOLN
INTRAMUSCULAR | Status: DC | PRN
  Administered 2016-03-08 (×2): 1 mg via INTRAVENOUS

## 2016-03-08 MED ORDER — LIDOCAINE VISCOUS 2 % MT SOLN
OROMUCOSAL | Status: AC
  Filled 2016-03-08: qty 15

## 2016-03-08 MED ORDER — LIDOCAINE VISCOUS 2 % MT SOLN
15.0000 mL | Freq: Once | OROMUCOSAL | Status: AC
  Administered 2016-03-08: 15 mL via OROMUCOSAL

## 2016-03-08 MED ORDER — PROPOFOL 10 MG/ML IV BOLUS
INTRAVENOUS | Status: AC
  Filled 2016-03-08: qty 20

## 2016-03-08 MED ORDER — LACTATED RINGERS IV SOLN: INTRAVENOUS | Status: DC

## 2016-03-08 MED ORDER — DIPHENHYDRAMINE HCL 25 MG PO CAPS
25.0000 mg | ORAL_CAPSULE | ORAL | Status: DC | PRN
  Administered 2016-03-08 – 2016-03-23 (×21): 25 mg via ORAL
  Filled 2016-03-08 (×21): qty 1

## 2016-03-08 MED ORDER — MIDAZOLAM HCL 2 MG/2ML IJ SOLN
1.0000 mg | INTRAMUSCULAR | Status: DC | PRN
  Administered 2016-03-08: 2 mg via INTRAVENOUS

## 2016-03-08 MED ORDER — FENTANYL CITRATE (PF) 100 MCG/2ML IJ SOLN
INTRAMUSCULAR | Status: AC
  Filled 2016-03-08: qty 2

## 2016-03-08 MED ORDER — HYDROMORPHONE HCL 1 MG/ML IJ SOLN: 0.2500 mg | INTRAMUSCULAR | Status: DC | PRN

## 2016-03-08 MED ORDER — MIDAZOLAM HCL 2 MG/2ML IJ SOLN
INTRAMUSCULAR | Status: AC
  Filled 2016-03-08: qty 2

## 2016-03-08 MED ORDER — SODIUM CHLORIDE 0.9 % IV SOLN
INTRAVENOUS | Status: DC
  Administered 2016-03-08: 15:00:00 via INTRAVENOUS

## 2016-03-08 NOTE — Care Management Note (Signed)
Case Management Note  Patient Details  Name: Antonio [name] MRN: [id number] Date of Birth: 1970/07/20  Subjective/Objective:                  Pt is from home, lives with his wife and is ind with ADL's. Pt has WC that he does not use. He has PCP, transportation and no difficulty affording his medications. He plans to return home with self care.   Action/Plan: No CM needs anticipated.   Expected Discharge Date:    03/11/2016             Expected Discharge Plan:  Home/Self Care  In-House Referral:  NA  Discharge planning Services  CM Consult  Post Acute Care Choice:  NA Choice offered to:  NA  DME Arranged:    DME Agency:     HH Arranged:    HH Agency:     Status of Service:  In process, will continue to follow  Sherald Barge, RN 03/08/2016, 1:58 PM

## 2016-03-08 NOTE — Progress Notes (Signed)
PROGRESS NOTE    Antonio [name]  [id number] DOB: 08-28-1970 DOA: 03/06/2016 PCP: Deloria Lair, MD    Brief Narrative:  45 y.o. male with medical history significant of ESLD and CKD with morbid obesity.  He presents today for worsening LE edema and anuria. In the outpatient setting, patient was given an increased dose of diuretic with little to no improvement. Patient was found to have worsening renal function on presentation and was admitted for management of volume overload.  Assessment & Plan:   Principal Problem:   Decompensation of cirrhosis of liver (HCC) Active Problems:   Anemia   Anasarca   Thrombocytopenia (HCC)   Acute renal failure (HCC)   Bilateral edema of lower extremity   Hyponatremia   Hyperglycemia   Protein-calorie malnutrition, severe (HCC)   CHF (congestive heart failure) (HCC)   Rectal bleeding   Decompensated cirrhosis of liver -Recently calculated Mills score noted to be around 35 which correlates to a 30% 3 months survival -Patient supposedly is not yet on a transplant list at this time -Total bilirubin remains elevated at 7, INR remains unchanged at 2.18 -Overall poor prognosis -I have discussed the above findings with the patient at length. Specifically, in relation to [place]. Patient states he will discuss with his wife later today. Patient does understand the severity of his current condition. -We'll continue patient on lactulose and rifaximin -We'll repeat comprehensive metabolic panel in the morning  ARF -Patient not oliguric at present -Patient is is continued on IV Lasix -Nephrology continues to follow -Creatinine has improved from 3.58 to 2.52 -We'll repeat comprehensive metabolic panel in the morning  Hyponatremia -Suspect secondary to volume overload secondary to liver disease -Sodium is slowly improving with IV Lasix -Repeat competence of metabolic panel in the morning  Acutely decompensated chronic CHF -Continue to  diuresis as per above -Seems to be improving  Suspected acute blood loss Anemia from possible upper GI bleeding -Hemoglobin noted to be as low as 7.2 this admission. Patient was given 1 unit of packed blood blood cells on 03/07/2016 -Posttransfusion hemoglobin of 7.8 noted -Gastroenterology was consulted and patient has undergone endoscopy on 03/08/2016 findings of grade 2 esophageal varices, portal hypertensive gastropathy -GI recommendation for clear liquid diet for now, continue Adderall with target heart rate of 55  Thrombocytopenia -Suspect related to liver disease -Platelets remain stable  Hyperglycemia -Morning blood work reviewed -Glucose appears to be controlled  Malnutrition -Dietitian was consulted  DVT prophylaxis: SCDs Code Status: Full code Family Communication: Patient in room, family not at bedside Disposition Plan: Uncertain at this time  Consultants:   Gastroenterology  Nephrology  Procedures:     Antimicrobials: Anti-infectives    Start     Dose/Rate Route Frequency Ordered Stop   03/07/16 1400  cefTRIAXone (ROCEPHIN) 1 g in dextrose 5 % 50 mL IVPB    Comments:  Please dose adjust if needed for renal insufficency   1 g 100 mL/hr over 30 Minutes Intravenous Every 24 hours 03/07/16 1339     03/06/16 1800  rifaximin (XIFAXAN) tablet 550 mg     550 mg Oral 2 times daily 03/06/16 1734        Subjective: Patient is without complaints  Objective: Vitals:   03/08/16 1537 03/08/16 1545 03/08/16 1600 03/08/16 1630  BP: 96/70 113/66 128/64   Pulse:  77 75   Resp: 15 16 16    Temp: 97.5 F (36.4 C)   98.2 F (36.8 C)  TempSrc:    Oral  SpO2: 100% 100% 100%   Weight:      Height:        Intake/Output Summary (Last 24 hours) at 03/08/16 1759 Last data filed at 03/08/16 1700  Gross per 24 hour  Intake              416 ml  Output             3934 ml  Net            -3518 ml   Filed Weights   03/06/16 1737 03/07/16 0500 03/08/16 0500    Weight: (!) 164.8 kg (363 lb 5.1 oz) (!) 166.3 kg (366 lb 10 oz) (!) 165.6 kg (365 lb 1.3 oz)    Examination:  General exam: Lying in bed, no acute distress  Respiratory system: Normal respiratory effort, no audible wheezing Cardiovascular system: Regular rhythm, S1-S2 Gastrointestinal system: Obese, positive bowel sounds, nondistended Central nervous system: Cranial nerves II through XII grossly intact, no seizures Extremities: Perfused, no clubbing Skin: Normal skin turgor, no pallor, still appears jaundiced Psychiatry: Mood normal, no auditory hallucinations  Data Reviewed: I have personally reviewed following labs and imaging studies  CBC:  Recent Labs Lab 03/06/16 1300 03/07/16 0604 03/07/16 1318 03/07/16 1942 03/08/16 0552  WBC 10.2 10.6*  --   --  11.1*  NEUTROABS 6.1  --   --   --   --   HGB 7.4* 7.2* 7.2* 7.8* 7.6*  HCT 23.0* 22.1* 21.9* 23.3* 22.9*  MCV 92.0 91.3  --   --  90.9  PLT 149* 149*  --   --  123456*   Basic Metabolic Panel:  Recent Labs Lab 03/06/16 1300 03/07/16 0604 03/08/16 0552  NA 128* 129* 130*  K 4.7 4.6 3.5  CL 99* 101 99*  CO2 22 21* 23  GLUCOSE 108* 104* 91  BUN 53* 60* 55*  CREATININE 3.26* 3.58* 2.52*  CALCIUM 8.7* 8.7* 8.7*   GFR: Estimated Creatinine Clearance: 61.8 mL/min (by C-G formula based on SCr of 2.52 mg/dL (H)). Liver Function Tests:  Recent Labs Lab 03/06/16 1300 03/08/16 0552  AST 61* 56*  ALT 23 21  ALKPHOS 164* 141*  BILITOT 7.0* 7.0*  PROT 7.6 7.2  ALBUMIN 2.3* 2.2*    Recent Labs Lab 03/06/16 1300  LIPASE 59*    Recent Labs Lab 03/06/16 1732  AMMONIA 129*   Coagulation Profile:  Recent Labs Lab 03/06/16 1300 03/08/16 0552  INR 2.18 2.18   Cardiac Enzymes: No results for input(s): CKTOTAL, CKMB, CKMBINDEX, TROPONINI in the last 168 hours. BNP (last 3 results) No results for input(s): PROBNP in the last 8760 hours. HbA1C: No results for input(s): HGBA1C in the last 72  hours. CBG: No results for input(s): GLUCAP in the last 168 hours. Lipid Profile: No results for input(s): CHOL, HDL, LDLCALC, TRIG, CHOLHDL, LDLDIRECT in the last 72 hours. Thyroid Function Tests: No results for input(s): TSH, T4TOTAL, FREET4, T3FREE, THYROIDAB in the last 72 hours. Anemia Panel: No results for input(s): VITAMINB12, FOLATE, FERRITIN, TIBC, IRON, RETICCTPCT in the last 72 hours. Sepsis Labs: No results for input(s): PROCALCITON, LATICACIDVEN in the last 168 hours.  Recent Results (from the past 240 hour(s))  MRSA PCR Screening     Status: None   Collection Time: 03/06/16  6:00 PM  Result Value Ref Range Status   MRSA by PCR NEGATIVE NEGATIVE Final    Comment:        The GeneXpert MRSA Assay (FDA approved  for NASAL specimens only), is one component of a comprehensive MRSA colonization surveillance program. It is not intended to diagnose MRSA infection nor to guide or monitor treatment for MRSA infections.      Radiology Studies: US Renal  Result Date: 03/07/2016 CLINICAL DATA:  Recently diagnosed with acute tubular necrosis. History of CHF, alcoholic hepatitis and cirrhosis. EXAM: RENAL / URINARY TRACT ULTRASOUND COMPLETE COMPARISON:  None in PACs FINDINGS: Right Kidney: Length: 13.3 cm. The renal cortical echotexture is approximately equal to that of the adjacent liver. There is no cortical atrophy. There is no hydronephrosis. No stones are evident. Left Kidney: Length: 12.8 cm. The renal cortical echotexture is similar to that on the right. There is no hydronephrosis. No cortical atrophy. No evidence of stones. Bladder: The urinary bladder is decompressed with a Foley catheter. IMPRESSION: Slightly increased renal cortical echotexture as compared to the liver though this may be spurring this given the patient's known cirrhosis. No evidence of cortical atrophy or obstruction. Nondistended urinary bladder with Foley catheter present. Electronically Signed   By: David   Martinique M.D.   On: 03/07/2016 11:36    Scheduled Meds: . cefTRIAXone (ROCEPHIN)  IV  1 g Intravenous Q24H  . docusate sodium  100 mg Oral BID  . feeding supplement (PRO-STAT SUGAR FREE 64)  45 mL Oral TID  . folic acid  1 mg Oral Daily  . furosemide  160 mg Intravenous BID  . lactulose  20 g Oral TID  . magnesium oxide  400 mg Oral BID  . pantoprazole  40 mg Oral BID AC  . rifaximin  550 mg Oral BID  . sodium chloride flush  3 mL Intravenous Q12H  . thiamine  100 mg Oral Daily   Continuous Infusions:    LOS: 2 days   Dontavion Noxon, Orpah Melter, MD [facility] Pager [phone]  If 7PM-7AM, please contact night-coverage [email or web address] Password [email or web address] 03/08/2016, 5:59 PM

## 2016-03-08 NOTE — Progress Notes (Signed)
Antonio [name]  MRN: [id number]  DOB/AGE: 43-May-1972 43 y.o.  Primary Care Physician:TAPPER,DAVID B, MD  Admit date: 03/06/2016  Chief Complaint:  Chief Complaint  Patient presents with  . Leg Swelling    S-Pt presented on  03/06/2016 with  Chief Complaint  Patient presents with  . Leg Swelling  .    Pt offers no new complaints.    Pt says " I feel better, I am waiting for then to do the scope"    Meds . cefTRIAXone (ROCEPHIN)  IV  1 g Intravenous Q24H  . docusate sodium  100 mg Oral BID  . feeding supplement (PRO-STAT SUGAR FREE 64)  45 mL Oral TID  . folic acid  1 mg Oral Daily  . furosemide  160 mg Intravenous BID  . lactulose  20 g Oral TID  . magnesium oxide  400 mg Oral BID  . pantoprazole  40 mg Oral BID AC  . rifaximin  550 mg Oral BID  . sodium chloride flush  3 mL Intravenous Q12H  . thiamine  100 mg Oral Daily      Physical Exam: Vital signs in last 24 hours: Temp:  [96.9 F (36.1 C)-98.7 F (37.1 C)] 98.1 F (36.7 C) (10/12 0400) Pulse Rate:  [61-113] 79 (10/12 0700) Resp:  [12-26] 13 (10/12 0600) BP: (97-123)/(43-59) 97/49 (10/12 0400) SpO2:  [94 %-100 %] 100 % (10/12 0700) Weight:  [365 lb 1.3 oz (165.6 kg)] 365 lb 1.3 oz (165.6 kg) (10/12 0500) Weight change: 34 lb 1.3 oz (15.5 kg) Last BM Date: 03/07/16  Intake/Output from previous day: 10/11 0701 - 10/12 0700 In: 1148.7 [P.O.:720; I.V.:2.7; Blood:310; IV Piggyback:116] Out: 3009 [Urine:3000; Stool:9] No intake/output data recorded.   Physical Exam: General- pt is awake,alert, follows coomands Resp- No acute REsp distress,decreased bs at bases + CVS- S1S2 regular in rate and rhythm GIT- BS+, soft, distended, morbidly obese EXT- 2+ LE Edema, No Cyanosis   Lab Results: CBC  Recent Labs  03/07/16 0604  03/07/16 1942 03/08/16 0552  WBC 10.6*  --   --  11.1*  HGB 7.2*  < > 7.8* 7.6*  HCT 22.1*  < > 23.3* 22.9*  PLT 149*  --   --  144*  < > = values in this interval not  displayed.  BMET  Recent Labs  03/07/16 0604 03/08/16 0552  NA 129* 130*  K 4.6 3.5  CL 101 99*  CO2 21* 23  GLUCOSE 104* 91  BUN 60* 55*  CREATININE 3.58* 2.52*  CALCIUM 8.7* 8.7*   Creat trend 2017   3.26==>3.58==>2.52 2017  1.2--2.0( august admission)     MICRO Recent Results (from the past 240 hour(s))  MRSA PCR Screening     Status: None   Collection Time: 03/06/16  6:00 PM  Result Value Ref Range Status   MRSA by PCR NEGATIVE NEGATIVE Final    Comment:        The GeneXpert MRSA Assay (FDA approved for NASAL specimens only), is one component of a comprehensive MRSA colonization surveillance program. It is not intended to diagnose MRSA infection nor to guide or monitor treatment for MRSA infections.       Lab Results  Component Value Date   CALCIUM 8.7 (L) 03/08/2016   PHOS 4.4 01/17/2016         Impression: 1)Renal  AKI secondary to Prerenal/ATN                AKI possibly Hepatorenal  Data against hepatorenal                Urine Na more than 10                Not Oliguric                   AKI improving  2)HTN  BP stable  Medication- On Diuretics-  3)Anemia HGb trending low 9.4==>7.2=>7.8 Sec to GI bleeding received [facility] GI and  Primary team  following  4)Anasarca- on Diuretics  5)Thrombocytopenia Sec to Liver related issues Primary MD follwoing following  6)Electrolytes  Normokalemic  Hyponatremic     Hypervolemia hyponatremia     improving  7)Acid base Co2 at goal     Plan:  Will follow bmet Will continue current care. I had extensive discussion with pt about his kidney issues.      Juelle Dickmann S 03/08/2016, 9:06 AM

## 2016-03-08 NOTE — Anesthesia Procedure Notes (Signed)
Procedure Name: MAC Date/Time: 03/08/2016 2:40 PM Performed by: Vista Deck Pre-anesthesia Checklist: Patient identified, Emergency Drugs available, Suction available, Timeout performed and Patient being monitored Patient Re-evaluated:Patient Re-evaluated prior to inductionOxygen Delivery Method: Non-rebreather mask

## 2016-03-08 NOTE — Transfer of Care (Signed)
Immediate Anesthesia Transfer of Care Note  Patient: Antonio [name]  Procedure(s) Performed: Procedure(s) with comments: COLONOSCOPY WITH PROPOFOL ([name]) ESOPHAGOGASTRODUODENOSCOPY (EGD) WITH PROPOFOL ([name]) BIOPSY - descending colon biopsies POLYPECTOMY - cecum and descending   Patient Location: PACU  Anesthesia Type:MAC  Level of Consciousness: sedated and patient cooperative  Airway & Oxygen Therapy: Patient Spontanous Breathing and Patient connected to nasal cannula oxygen  Post-op Assessment: Report given to RN and Post -op Vital signs reviewed and stable  Post vital signs: Reviewed  Last Vitals:  Vitals:   03/08/16 1401 03/08/16 1410  BP:  (!) 117/59  Pulse:  77  Resp:    Temp: 36.6 C     Last Pain:  Vitals:   03/08/16 1410  TempSrc: Oral  PainSc:       Patients Stated Pain Goal: 3 ([id number])  Complications: No apparent anesthesia complications

## 2016-03-08 NOTE — Op Note (Signed)
[facility] Patient Name: Antonio [name] Procedure Date: 03/08/2016 3:24 PM MRN: [id number] Date of Birth: Oct 31, 1970 Attending MD: Norvel Richards , MD CSN: [id number] Age: 45 Admit Type: Inpatient Procedure:                Upper GI endoscopy - diagnostic Indications:              Melena Providers:                Norvel Richards, MD, Lurline Del, RN, Charlyne Petrin, RN, Randa Spike, Technician Referring MD:              Medicines:                Propofol per Anesthesia Complications:            No immediate complications. Estimated Blood Loss:     Estimated blood loss: none. Procedure:                Pre-Anesthesia Assessment:                           - Prior to the procedure, a History and Physical                            was performed, and patient medications and                            allergies were reviewed. The patient's tolerance of                            previous anesthesia was also reviewed. The risks                            and benefits of the procedure and the sedation                            options and risks were discussed with the patient.                            All questions were answered, and informed consent                            was obtained. Prior Anticoagulants: The patient has                            taken no previous anticoagulant or antiplatelet                            agents. ASA Grade Assessment: III - A patient with                            severe systemic disease. After reviewing the risks  and benefits, the patient was deemed in                            satisfactory condition to undergo the procedure.                           After obtaining informed consent, the endoscope was                            passed under direct vision. Throughout the                            procedure, the patient's blood pressure, pulse, and   oxygen saturations were monitored continuously. The                            [id number] scope was introduced through the                            mouth, and advanced to the second part of duodenum.                            The upper GI endoscopy was accomplished without                            difficulty. The patient tolerated the procedure                            well. Scope In: 3:26:34 PM Scope Out: 3:30:44 PM Total Procedure Duration: 0 hours 4 minutes 10 seconds  Findings:      Grade II varices were found in the lower third of the esophagus. No       bleeding stigmata. Overlying mucosa otherwise appeared normal.      Portal hypertensive gastropathy was found in the entire examined       stomach. No gastric ulcer. No gastric varices. Patent pylorus.      The duodenal bulb and second portion of the duodenum were normal. Impression:               - Grade II esophageal varices .                           - Portal hypertensive gastropathy.                           - Normal duodenal bulb and second portion of the                            duodenum.                           - No specimens collected. I suspect patient bled                            from his lower GI tract. See colonoscopy report.  Follow hemoglobin. Moderate Sedation:      Moderate (conscious) sedation was personally administered by an       anesthesia professional. The following parameters were monitored: oxygen       saturation, heart rate, blood pressure, respiratory rate, EKG, adequacy       of pulmonary ventilation, and response to care. Total physician       intraservice time was 45 minutes. Recommendation:           - Return patient to ICU for ongoing care.                           - Clear liquid diet.                           - Continue present medications.                           - No repeat upper endoscopy.                           - Return to GI office  (date not yet determined).                            Continue Nadolol - nonspecific beta blocker. Target                            heart rate 55. I discussed my findings today with                            patient's wife, Antonio [name], at [phone] Procedure Code(s):        --- Professional ---                           [phone], Esophagogastroduodenoscopy, flexible,                            transoral; diagnostic, including collection of                            specimen(s) by brushing or washing, when performed                            (separate procedure) Diagnosis Code(s):        --- Professional ---                           I85.00, Esophageal varices without bleeding                           K76.6, Portal hypertension                           K31.89, Other diseases of stomach and duodenum                           K92.1, Melena (includes Hematochezia) CPT copyright 2016 [employer]. All rights  reserved. The codes documented in this report are preliminary and upon coder review may  be revised to meet current compliance requirements. Cristopher Estimable. Jhene Westmoreland, MD Norvel Richards, MD 03/08/2016 3:45:51 PM This report has been signed electronically. Number of Addenda: 0

## 2016-03-08 NOTE — Anesthesia Postprocedure Evaluation (Signed)
Anesthesia Post Note  Patient: Antonio [name]  Procedure(s) Performed: Procedure(s) (LRB): COLONOSCOPY WITH PROPOFOL ([name]) ESOPHAGOGASTRODUODENOSCOPY (EGD) WITH PROPOFOL ([name]) BIOPSY POLYPECTOMY  Patient location during evaluation: PACU Anesthesia Type: MAC Level of consciousness: awake Pain management: satisfactory to patient Vital Signs Assessment: post-procedure vital signs reviewed and stable Respiratory status: spontaneous breathing Cardiovascular status: stable Anesthetic complications: no    Last Vitals:  Vitals:   03/08/16 1410 03/08/16 1537  BP: (!) 117/59 96/70  Pulse: 77   Resp:  15  Temp:  36.4 C    Last Pain:  Vitals:   03/08/16 1537  TempSrc:   PainSc: Asleep                 Rozlyn Yerby

## 2016-03-08 NOTE — Anesthesia Preprocedure Evaluation (Signed)
Anesthesia Evaluation  Patient identified by MRN, date of birth, ID band Patient awake    Reviewed: Allergy & Precautions, NPO status , Patient's Chart, lab work & pertinent test results  Airway Mallampati: II  TM Distance: >3 FB     Dental  (+) Teeth Intact   Pulmonary shortness of breath, Current Smoker, former smoker,    breath sounds clear to auscultation       Cardiovascular +CHF and + DOE   Rhythm:Regular Rate:Normal     Neuro/Psych    GI/Hepatic (+) Cirrhosis   ascites    , Hepatitis -  Endo/Other  Morbid obesity  Renal/GU      Musculoskeletal   Abdominal   Peds  Hematology  (+) anemia ,   Anesthesia Other Findings   Reproductive/Obstetrics                             Anesthesia Physical Anesthesia Plan  ASA: III  Anesthesia Plan: MAC   Post-op Pain Management:    Induction: Intravenous  Airway Management Planned: Simple Face Mask  Additional Equipment:   Intra-op Plan:   Post-operative Plan:   Informed Consent: I have reviewed the patients History and Physical, chart, labs and discussed the procedure including the risks, benefits and alternatives for the proposed anesthesia with the patient or authorized representative who has indicated his/her understanding and acceptance.     Plan Discussed with:   Anesthesia Plan Comments:         Anesthesia Quick Evaluation

## 2016-03-09 ENCOUNTER — Inpatient Hospital Stay (HOSPITAL_COMMUNITY): Payer: BLUE CROSS/BLUE SHIELD

## 2016-03-09 DIAGNOSIS — R601 Generalized edema: Secondary | ICD-10-CM

## 2016-03-09 DIAGNOSIS — R600 Localized edema: Secondary | ICD-10-CM

## 2016-03-09 DIAGNOSIS — E871 Hypo-osmolality and hyponatremia: Secondary | ICD-10-CM

## 2016-03-09 DIAGNOSIS — E876 Hypokalemia: Secondary | ICD-10-CM

## 2016-03-09 DIAGNOSIS — N179 Acute kidney failure, unspecified: Secondary | ICD-10-CM

## 2016-03-09 DIAGNOSIS — D696 Thrombocytopenia, unspecified: Secondary | ICD-10-CM

## 2016-03-09 LAB — CBC
HCT: 22 % — ABNORMAL LOW (ref 39.0–52.0)
Hemoglobin: 7 g/dL — ABNORMAL LOW (ref 13.0–17.0)
MCH: 30.2 pg (ref 26.0–34.0)
MCHC: 31.8 g/dL (ref 30.0–36.0)
MCV: 94.8 fL (ref 78.0–100.0)
Platelets: 139 10*3/uL — ABNORMAL LOW (ref 150–400)
RBC: 2.32 MIL/uL — ABNORMAL LOW (ref 4.22–5.81)
RDW: 19.7 % — AB (ref 11.5–15.5)
WBC: 11.1 10*3/uL — ABNORMAL HIGH (ref 4.0–10.5)

## 2016-03-09 LAB — COMPREHENSIVE METABOLIC PANEL
ALBUMIN: 2 g/dL — AB (ref 3.5–5.0)
ALK PHOS: 168 U/L — AB (ref 38–126)
ALT: 20 U/L (ref 17–63)
AST: 54 U/L — AB (ref 15–41)
Anion gap: 9 (ref 5–15)
BILIRUBIN TOTAL: 5.7 mg/dL — AB (ref 0.3–1.2)
BUN: 53 mg/dL — AB (ref 6–20)
CALCIUM: 8.3 mg/dL — AB (ref 8.9–10.3)
CO2: 23 mmol/L (ref 22–32)
Chloride: 99 mmol/L — ABNORMAL LOW (ref 101–111)
Creatinine, Ser: 2.21 mg/dL — ABNORMAL HIGH (ref 0.61–1.24)
GFR calc Af Amer: 40 mL/min — ABNORMAL LOW (ref >60)
GFR calc non Af Amer: 35 mL/min — ABNORMAL LOW (ref >60)
GLUCOSE: 119 mg/dL — AB (ref 65–99)
Potassium: 2.9 mmol/L — ABNORMAL LOW (ref 3.5–5.1)
Sodium: 131 mmol/L — ABNORMAL LOW (ref 135–145)
TOTAL PROTEIN: 6.8 g/dL (ref 6.5–8.1)

## 2016-03-09 LAB — MAGNESIUM: MAGNESIUM: 1.8 mg/dL (ref 1.7–2.4)

## 2016-03-09 LAB — HEMOGLOBIN AND HEMATOCRIT, BLOOD
HCT: 22 % — ABNORMAL LOW (ref 39.0–52.0)
HEMATOCRIT: 25.3 % — AB (ref 39.0–52.0)
Hemoglobin: 7.1 g/dL — ABNORMAL LOW (ref 13.0–17.0)
Hemoglobin: 8.2 g/dL — ABNORMAL LOW (ref 13.0–17.0)

## 2016-03-09 LAB — PREPARE RBC (CROSSMATCH)

## 2016-03-09 MED ORDER — POTASSIUM CHLORIDE CRYS ER 20 MEQ PO TBCR
60.0000 meq | EXTENDED_RELEASE_TABLET | Freq: Once | ORAL | Status: AC
  Administered 2016-03-09: 60 meq via ORAL
  Filled 2016-03-09: qty 3

## 2016-03-09 MED ORDER — SODIUM CHLORIDE 0.9 % IV SOLN
Freq: Once | INTRAVENOUS | Status: AC
  Administered 2016-03-09: 15:00:00 via INTRAVENOUS

## 2016-03-09 MED ORDER — FUROSEMIDE 10 MG/ML IJ SOLN
20.0000 mg | Freq: Once | INTRAMUSCULAR | Status: AC
  Administered 2016-03-09: 20 mg via INTRAVENOUS
  Filled 2016-03-09: qty 2

## 2016-03-09 MED ORDER — NADOLOL 40 MG PO TABS
40.0000 mg | ORAL_TABLET | Freq: Every day | ORAL | Status: DC
  Administered 2016-03-09 – 2016-03-23 (×14): 40 mg via ORAL
  Filled 2016-03-09 (×20): qty 1

## 2016-03-09 NOTE — Progress Notes (Signed)
Nutrition Follow-up  DOCUMENTATION CODES:  Morbid obesity  INTERVENTION:  High protein snacks in between meals.   Continue 45 mL Prostat TID, each supplement provides 150 kcal and 23 grams of protein.   Recommend Zinc, Vitamin D supplementation  NUTRITION DIAGNOSIS:  Increased nutrient needs related to chronic illness (cirrhosis) as evidenced by estimated nutritional requirements for this disease state  -ongoing, chronic  GOAL:  Patient will meet greater than or equal to 90% of their needs   -Not met  MONITOR:  PO intake, Supplement acceptance, Labs, Weight trends, I & O's  ASSESSMENT:  45 y/o male PMHx Cirrhosis, CKD, morbid obesity. Presented for worsening edema and anuria. Had recent hospitalization for decompensated cirrhosis (D/C 8/22). Admitted again for decompensated cirrhosis and ARF.  Interval hx 10/11- 10/13. EGD conducted, Varices identified. Diet has been advanced.   Pt has eaten 100% of yesterday dinner and todays breakfast.   Pt is more alert today. He reports that he has a good appetite. Denies any n/v/c or abnormal diarrhea.   Reiterated importance of frequent meals. He was agreeable to the snacks we had discussed on initial assessment.   NFPE: Morbidly obese, edema  Medications: Lactulose, folate, colace, lasix, mag ox, colace IVF, PPi, Abx, Thiamin,  Labs: hyponatremia improved,  Hemoglobin: 7.0, Albumin: 2.0, K: 2.9, CBGs 90-120   Recent Labs Lab 03/07/16 0604 03/08/16 0552 03/09/16 0445  NA 129* 130* 131*  K 4.6 3.5 2.9*  CL 101 99* 99*  CO2 21* 23 23  BUN 60* 55* 53*  CREATININE 3.58* 2.52* 2.21*  CALCIUM 8.7* 8.7* 8.3*  MG  --   --  1.8  GLUCOSE 104* 91 119*    Diet Order:  Diet Heart Room service appropriate? Yes; Fluid consistency: Thin  Skin:  Reviewed, no issues  Last BM:  10/12  Height:  Ht Readings from Last 1 Encounters:  03/06/16 _0  (1.88 m)   Weight:  Wt Readings from Last 1 Encounters:  03/09/16 (!) 366 lb 2.9 oz  (166.1 kg)   Wt Readings from Last 10 Encounters:  03/09/16 (!) 366 lb 2.9 oz (166.1 kg)  02/08/16 (!) 337 lb 6.4 oz (153 kg)  01/17/16 (!) 389 lb 9.6 oz (176.7 kg)  12/28/15 (!) 359 lb 2.1 oz (162.9 kg)  Admit weight was 331 lbs  Ideal Body Weight:  86.36 kg  BMI:  Body mass index using dry weight is 42.6 kg/m.  Estimated Nutritional Needs:  Kcal:  >2650 kcals (1.4 x Harris Benedict Eq)  Protein:  120-140 g (1.4-1.6 g/kg IBW) Fluid:  <1.5 liters  EDUCATION NEEDS:  Education needs no appropriate at this time  Burtis Junes RD, LDN, [place] [facility] Pager: [phone] 03/09/2016 11:49 AM

## 2016-03-09 NOTE — Progress Notes (Signed)
Antonio [name]  MRN: [id number]  DOB/AGE: July 18, 1970 45 y.o.  Primary Care Physician:TAPPER,DAVID B, MD  Admit date: 03/06/2016  Chief Complaint:  Chief Complaint  Patient presents with  . Leg Swelling    S-Pt presented on  03/06/2016 with  Chief Complaint  Patient presents with  . Leg Swelling  .    Pt offers no new complaints.    Pt says " I feel better"   Meds . sodium chloride   Intravenous Once  . cefTRIAXone (ROCEPHIN)  IV  1 g Intravenous Q24H  . docusate sodium  100 mg Oral BID  . feeding supplement (PRO-STAT SUGAR FREE 64)  45 mL Oral TID  . folic acid  1 mg Oral Daily  . furosemide  160 mg Intravenous BID  . furosemide  20 mg Intravenous Once  . lactulose  20 g Oral TID  . magnesium oxide  400 mg Oral BID  . pantoprazole  40 mg Oral BID AC  . rifaximin  550 mg Oral BID  . sodium chloride flush  3 mL Intravenous Q12H  . thiamine  100 mg Oral Daily      Physical Exam: Vital signs in last 24 hours: Temp:  [97.2 F (36.2 C)-98.2 F (36.8 C)] 98.1 F (36.7 C) (10/13 1203) Pulse Rate:  [75-85] 85 (10/13 1203) Resp:  [14-20] 20 (10/13 1203) BP: (94-128)/(59-70) 94/60 (10/12 2200) SpO2:  [87 %-100 %] 100 % (10/13 1203) Weight:  [366 lb 2.9 oz (166.1 kg)] 366 lb 2.9 oz (166.1 kg) (10/13 0509) Weight change: 1 lb 1.6 oz (0.5 kg) Last BM Date: 03/08/16  Intake/Output from previous day: 10/12 0701 - 10/13 0700 In: 1496 [P.O.:1080; I.V.:300; IV Piggyback:116] Out: 3625 [Urine:3625] Total I/O In: 483 [P.O.:480; I.V.:3] Out: -    Physical Exam: General- pt is awake,alert, follows coomands Resp- No acute REsp distress,decreased bs at bases + CVS- S1S2 regular in rate and rhythm GIT- BS+, soft, distended, morbidly obese EXT- 2+ LE Edema, No Cyanosis   Lab Results: CBC  Recent Labs  03/08/16 0552 03/09/16 0445  WBC 11.1* 11.1*  HGB 7.6* 7.0*  HCT 22.9* 22.0*  PLT 144* 139*    BMET  Recent Labs  03/08/16 0552 03/09/16 0445  NA 130* 131*   K 3.5 2.9*  CL 99* 99*  CO2 23 23  GLUCOSE 91 119*  BUN 55* 53*  CREATININE 2.52* 2.21*  CALCIUM 8.7* 8.3*   Creat trend 2017   3.26==>3.58==>2.52=>2.2 2017  1.2--2.0( august admission)     MICRO Recent Results (from the past 240 hour(s))  MRSA PCR Screening     Status: None   Collection Time: 03/06/16  6:00 PM  Result Value Ref Range Status   MRSA by PCR NEGATIVE NEGATIVE Final    Comment:        The GeneXpert MRSA Assay (FDA approved for NASAL specimens only), is one component of a comprehensive MRSA colonization surveillance program. It is not intended to diagnose MRSA infection nor to guide or monitor treatment for MRSA infections.       Lab Results  Component Value Date   CALCIUM 8.3 (L) 03/09/2016   PHOS 4.4 01/17/2016         Impression: 1)Renal  AKI secondary to Prerenal/ATN                AKI possibly Hepatorenal                Data against hepatorenal  Urine Na more than 10                Not Oliguric                   AKI improving  2)HTN  BP stable  Medication- On Diuretics-  3)Anemia HGb trending low 9.4==>7.2=>7.8 Sec to GI bleeding received PRBC GI and  Primary team  following  4)Anasarca- on Diuretics  5)Thrombocytopenia Sec to Liver related issues Primary MD follwoing following  6)Electrolytes  Hypokalemic       Being replte  Hyponatremic     Hypervolemia hyponatremia     improving  7)Acid base Co2 at goal     Plan:  Will follow bmet Will continue current care.       Paeton Studer S 03/09/2016, 12:46 PM

## 2016-03-09 NOTE — Progress Notes (Signed)
PROGRESS NOTE    Antonio [name]  [id number] DOB: 11-Sep-1970 DOA: 03/06/2016 PCP: Deloria Lair, MD    Brief Narrative:  45 y.o. male with medical history significant of ESLD and CKD with morbid obesity.  He presents today for worsening LE edema and anuria. In the outpatient setting, patient was given an increased dose of diuretic with little to no improvement. Patient was found to have worsening renal function on presentation and was admitted for management of volume overload.  Assessment & Plan:   Principal Problem:   Decompensation of cirrhosis of liver (HCC) Active Problems:   Anemia   Anasarca   Thrombocytopenia (HCC)   Acute renal failure (HCC)   Lower extremity edema   Hyponatremia   Hyperglycemia   Protein-calorie malnutrition, severe (HCC)   CHF (congestive heart failure) (HCC)   Rectal bleeding   Decompensated cirrhosis of liver -See previous documentation. Patient noted to have a calculated meld score of around 35, correlating to a 30% 3 months survival -Patient supposedly not yet on transplant list officially -Total bilirubin has improved since admission -Overall poor prognosis -He admission H&P. Patient had rescinded his DO NOT RESUSCITATE status. He does understand severity of his condition. Suspect patient is in denial of his condition.  ARF -Not oliguric -Renal function continuing to improve with IV Lasix -Appreciate input by nephrology -Will repeat competence of metabolic panel in the morning  Hyponatremia -Likely secondary to volume overload from liver disease -Sodium improving with IV Lasix  Acutely decompensated chronic CHF -Continue IV Lasix as noted above -Seems stable -Patient noted to require 3 L nasal cannula overnight. I have ordered chest x-ray for interval change. X-ray personally reviewed by myself which does show some improvement in edema  Suspected acute blood loss Anemia from suspected GI bleeding -Patient has received 1 unit of  PRBCs on 03/07/2016. -Posttransfusion hemoglobin is low and noted to be 7.0 today. Additional 1 unit of PRBCs ordered. -Later today, nursing staff reports bright red blood per rectum. Gastroenterology was notified, recommendations noted -We'll follow-up repeat hemoglobin after transfusion -We'll repeat CBC in the morning -Patient is status post EGD and colonoscopy on 03/08/2016. 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 Rectal varices also noted.  Thrombocytopenia -Likely secondary to chronic liver disease -Platelets remain stable -Continue to monitor CBC  Hyperglycemia -Random glucose stable per morning lab work that was reviewed personally -Continue to monitor for now  Malnutrition -Dietitian had been consulted, appreciate input  Hypokalemia -Potassium 2.9 this morning, suspect related to diuresis -Have ordered magnesium level, which was found to be unremarkable. -Ordered by mouth potassium replacement -Replaced, repeat comprehensive metabolic panel morning  DVT prophylaxis: SCDs Code Status: Full code Family Communication: Patient in room, family not at bedside Disposition Plan: Uncertain at this time  Consultants:   Gastroenterology  Nephrology  Procedures:     Antimicrobials: Anti-infectives    Start     Dose/Rate Route Frequency Ordered Stop   03/07/16 1400  cefTRIAXone (ROCEPHIN) 1 g in dextrose 5 % 50 mL IVPB    Comments:  Please dose adjust if needed for renal insufficency   1 g 100 mL/hr over 30 Minutes Intravenous Every 24 hours 03/07/16 1339     03/06/16 1800  rifaximin (XIFAXAN) tablet 550 mg     550 mg Oral 2  times daily 03/06/16 1734        Subjective: No complaints  Objective: Vitals:   03/09/16 0016 03/09/16 0509 03/09/16 0752 03/09/16 1203  BP:        Pulse:    85  Resp:   14 20  Temp: 97.2 F (36.2 C) 97.8 F (36.6 C) 97.2 F (36.2 C) 98.1 F (36.7 C)  TempSrc: Oral Oral Oral Oral  SpO2:    100%  Weight:  (!) 166.1 kg (366 lb 2.9 oz)    Height:        Intake/Output Summary (Last 24 hours) at 03/09/16 1526 Last data filed at 03/09/16 1326  Gross per 24 hour  Intake             1563 ml  Output             2550 ml  Net             -987 ml   Filed Weights   03/07/16 0500 03/08/16 0500 03/09/16 0509  Weight: (!) 166.3 kg (366 lb 10 oz) (!) 165.6 kg (365 lb 1.3 oz) (!) 166.1 kg (366 lb 2.9 oz)    Examination:  General exam: Awake, in no acute distress, conversant  Respiratory system: Normal chest rise, no audible crackles, speaking in full sentences Cardiovascular system: Regular rate, S1-S2 Gastrointestinal system: Non-distended, no masses Central nervous system: No seizures, sensation intact Extremities: No cyanosis, no joint deformities Skin: Seems jaundiced, no rashes, no notable skin lesions seen Psychiatry: Normal affect, no visual hallucinations  Data Reviewed: I have personally reviewed following labs and imaging studies  CBC:  Recent Labs Lab 03/06/16 1300 03/07/16 0604 03/07/16 1318 03/07/16 1942 03/08/16 0552 03/09/16 0445 03/09/16 1334  WBC 10.2 10.6*  --   --  11.1* 11.1*  --   NEUTROABS 6.1  --   --   --   --   --   --   HGB 7.4* 7.2* 7.2* 7.8* 7.6* 7.0* 7.1*  HCT 23.0* 22.1* 21.9* 23.3* 22.9* 22.0* 22.0*  MCV 92.0 91.3  --   --  90.9 94.8  --   PLT 149* 149*  --   --  144* 139*  --    Basic Metabolic Panel:  Recent Labs Lab 03/06/16 1300 03/07/16 0604 03/08/16 0552 03/09/16 0445  NA 128* 129* 130* 131*  K 4.7 4.6 3.5 2.9*  CL 99* 101 99* 99*  CO2 22 21* 23 23  GLUCOSE 108* 104* 91 119*  BUN 53* 60* 55* 53*  CREATININE 3.26* 3.58* 2.52* 2.21*  CALCIUM 8.7* 8.7* 8.7* 8.3*  MG  --   --   --  1.8   GFR: Estimated Creatinine Clearance: 70.6 mL/min (by C-G formula based on SCr of  2.21 mg/dL (H)). Liver Function Tests:  Recent Labs Lab 03/06/16 1300 03/08/16 0552 03/09/16 0445  AST 61* 56* 54*  ALT 23 21 20   ALKPHOS 164* 141* 168*  BILITOT 7.0* 7.0* 5.7*  PROT 7.6 7.2 6.8  ALBUMIN 2.3* 2.2* 2.0*    Recent Labs Lab 03/06/16 1300  LIPASE 59*    Recent Labs Lab 03/06/16 1732  AMMONIA 129*   Coagulation Profile:  Recent Labs Lab 03/06/16 1300 03/08/16 0552  INR 2.18 2.18   Cardiac Enzymes: No results for input(s): CKTOTAL, CKMB, CKMBINDEX, TROPONINI in the last 168 hours. BNP (last 3 results) No results for input(s): PROBNP in the last 8760 hours. HbA1C: No results for input(s): HGBA1C in the last 72 hours.  CBG: No results for input(s): GLUCAP in the last 168 hours. Lipid Profile: No results for input(s): CHOL, HDL, LDLCALC, TRIG, CHOLHDL, LDLDIRECT in the last 72 hours. Thyroid Function Tests: No results for input(s): TSH, T4TOTAL, FREET4, T3FREE, THYROIDAB in the last 72 hours. Anemia Panel: No results for input(s): VITAMINB12, FOLATE, FERRITIN, TIBC, IRON, RETICCTPCT in the last 72 hours. Sepsis Labs: No results for input(s): PROCALCITON, LATICACIDVEN in the last 168 hours.  Recent Results (from the past 240 hour(s))  MRSA PCR Screening     Status: None   Collection Time: 03/06/16  6:00 PM  Result Value Ref Range Status   MRSA by PCR NEGATIVE NEGATIVE Final    Comment:        The GeneXpert MRSA Assay (FDA approved for NASAL specimens only), is one component of a comprehensive MRSA colonization surveillance program. It is not intended to diagnose MRSA infection nor to guide or monitor treatment for MRSA infections.      Radiology Studies: Dg Chest Port 1 View  Result Date: 03/09/2016 CLINICAL DATA:  Hypoxia and rectal bleeding EXAM: PORTABLE CHEST 1 VIEW COMPARISON:  Chest radiograph 03/06/2016 FINDINGS: Cardiac silhouette remains enlarged. There is slightly improved aeration of the lung bases with associated slight  decrease in pulmonary edema. No pneumothorax or sizable pleural effusion. No focal airspace consolidation. IMPRESSION: Slightly improved mild pulmonary edema. Electronically Signed   By: Ulyses Jarred M.D.   On: 03/09/2016 13:52    Scheduled Meds: . sodium chloride   Intravenous Once  . cefTRIAXone (ROCEPHIN)  IV  1 g Intravenous Q24H  . docusate sodium  100 mg Oral BID  . feeding supplement (PRO-STAT SUGAR FREE 64)  45 mL Oral TID  . folic acid  1 mg Oral Daily  . furosemide  160 mg Intravenous BID  . furosemide  20 mg Intravenous Once  . lactulose  20 g Oral TID  . magnesium oxide  400 mg Oral BID  . nadolol  40 mg Oral Daily  . pantoprazole  40 mg Oral BID AC  . rifaximin  550 mg Oral BID  . sodium chloride flush  3 mL Intravenous Q12H  . thiamine  100 mg Oral Daily   Continuous Infusions:    LOS: 3 days   Reina Wilton, Orpah Melter, MD [facility] Pager [phone]  If 7PM-7AM, please contact night-coverage [email or web address] Password [email or web address] 03/09/2016, 3:26 PM

## 2016-03-09 NOTE — Progress Notes (Signed)
Called to patient room with nurse. Patient tried to have a bowel movement and when he stood up there was bright red/fresh blood in the commode and some dripping from his rectum. Nurse estimates around 100cc. Patient feels it is less then he had a couple days ago.   He had a planned unit of PRBC that is just now ready. H/H order entered, nurse to have lab draw it now before PRBC (it has been 8 hours since his CBC this morning).  Possibility of post-polypectomy bleed vs biopsy site bleed vs rectal variceal bleed in the setting of liver decompensation and elevated INR (2.18).  H/H now. Recheck H/H after PRBC. BP has been somewhat soft today, is on fluid restriction. BP stable for today, HR normal at 81.   Antonio Field, DNP, AGNP-C Adult & Gerontological Nurse Practitioner [facility]   ADDENDUM: H/H resulted and hgb stable from last CBC 8 hours prior (7.1 now, 7.0 this morning). Patient should be receiving PRBC now.

## 2016-03-09 NOTE — Progress Notes (Signed)
Subjective: States he's doing better. Feels like the swelling is going down. Again denies any ETOH, his wife at his bedside says he hasn't had any ETOH since we first saw him. She seems to be a motivated support person. He seems very motivated to get better. Still waiting to hear on transplant evaluation at [facility]. He is urinating better with the foley. Did not have any blood in his loose stools when taking bowel prep. No further bleeding noted.  Objective: Vital signs in last 24 hours: Temp:  [97.2 F (36.2 C)-98.2 F (36.8 C)] 97.2 F (36.2 C) (10/13 0752) Pulse Rate:  [75-78] 78 (10/12 2200) Resp:  [14-16] 14 (10/13 0752) BP: (94-128)/(59-70) 94/60 (10/12 2200) SpO2:  [87 %-100 %] 87 % (10/12 2200) Weight:  [366 lb 2.9 oz (166.1 kg)] 366 lb 2.9 oz (166.1 kg) (10/13 0509) Last BM Date: 03/08/16 General:   Alert and oriented, pleasant. In good spirits, determined. Eyes:  Some scleral icterus noted.  Heart:  S1, S2 present. 2/6 systolic murmur noted. Lungs: Clear to auscultation bilaterally, without wheezing, rales, or rhonchi.  Abdomen:  Bowel sounds present,non-tender, non-distended. No HSM or hernias noted. No rebound or guarding. No masses appreciated Dependent edema noted. Msk:  Symmetrical without gross deformities. Extremities:  3+ pitting edema bilateral lower extremities up to the thigh. Neurologic:  Alert and  oriented x4;  grossly normal neurologically. Psych:  Alert and cooperative. Normal mood and affect.  Intake/Output from previous day: 10/12 0701 - 10/13 0700 In: 1496 [P.O.:1080; I.V.:300; IV Piggyback:116] Out: 3625 [Urine:3625] Intake/Output this shift: Total I/O In: 240 [P.O.:240] Out: -   Lab Results:  Recent Labs  03/07/16 0604  03/07/16 1942 03/08/16 0552 03/09/16 0445  WBC 10.6*  --   --  11.1* 11.1*  HGB 7.2*  < > 7.8* 7.6* 7.0*  HCT 22.1*  < > 23.3* 22.9* 22.0*  PLT 149*  --   --  144* 139*  < > = values in this interval not  displayed. BMET  Recent Labs  03/07/16 0604 03/08/16 0552 03/09/16 0445  NA 129* 130* 131*  K 4.6 3.5 2.9*  CL 101 99* 99*  CO2 21* 23 23  GLUCOSE 104* 91 119*  BUN 60* 55* 53*  CREATININE 3.58* 2.52* 2.21*  CALCIUM 8.7* 8.7* 8.3*   LFT  Recent Labs  03/06/16 1300 03/08/16 0552 03/09/16 0445  PROT 7.6 7.2 6.8  ALBUMIN 2.3* 2.2* 2.0*  AST 61* 56* 54*  ALT 23 21 20   ALKPHOS 164* 141* 168*  BILITOT 7.0* 7.0* 5.7*   PT/INR  Recent Labs  03/06/16 1300 03/08/16 0552  LABPROT 24.6* 24.6*  INR 2.18 2.18   Hepatitis Panel No results for input(s): HEPBSAG, HCVAB, HEPAIGM, HEPBIGM in the last 72 hours.   Studies/Results: US Renal  Result Date: 03/07/2016 CLINICAL DATA:  Recently diagnosed with acute tubular necrosis. History of CHF, alcoholic hepatitis and cirrhosis. EXAM: RENAL / URINARY TRACT ULTRASOUND COMPLETE COMPARISON:  None in PACs FINDINGS: Right Kidney: Length: 13.3 cm. The renal cortical echotexture is approximately equal to that of the adjacent liver. There is no cortical atrophy. There is no hydronephrosis. No stones are evident. Left Kidney: Length: 12.8 cm. The renal cortical echotexture is similar to that on the right. There is no hydronephrosis. No cortical atrophy. No evidence of stones. Bladder: The urinary bladder is decompressed with a Foley catheter. IMPRESSION: Slightly increased renal cortical echotexture as compared to the liver though this may be spurring this given  the patient's known cirrhosis. No evidence of cortical atrophy or obstruction. Nondistended urinary bladder with Foley catheter present. Electronically Signed   By: David  Martinique M.D.   On: 03/07/2016 11:36    Assessment: 45 year old gentleman with history of alcoholic cirrhosis who presents with complaints of anasarca, anuria. Patient seen on 02/08/2016 as an outpatient. Had been abstinent from alcohol for 6 weeks at that time. He had marked improvement in his biochemical markers with  creatinine 1.41, sodium 134, INR 1.6, total bilirubin 4.2. Presented this admission with increasing creatinine, hyponatremia, rising bilirubin and INR, would be concerned for relapse with alcohol use although patient adamantly denied.   We have been consulted regarding questionable melena and decline in hemoglobin from September to now of approximately 2 g/dL. patient denies Pepto-Bismol, aspirin or NSAIDs. There is been no hematemesis. No abdominal pain. Issues regarding anasarca as outlined. No reported NSAID or aspirin use. Recent EGD with evidence of portal gastropathy and grade 1 and 2 esophageal varices either were actively bleeding during recent hospitalization. Patient is on nadolol. No prior colonoscopy.  On the afternoon of 03/07/2016 the patient was noted to have attended bowel movement and passed bright red blood per rectum without stool. Unlikely rapid transit upper GI bleed, no abdominal or rectal pain at that time. Hemoglobin was ordered to be rechecked and plan for colonoscopy with possible endoscopy on 03/08/2016. Rocephin was started for prophylaxis for SBP.  Upper endoscopy was performed without grade 2 varices in the lower third of the esophagus without bleeding stigmata and overlying mucosa otherwise normal. Portal hypertensive gastropathy throughout the stomach, no gastric ulcer or varices, patent pylorus. Suspected lower GI bleed. Follow hemoglobin.  CBC completed yesterday afternoon with a stable hemoglobin of 7.6, some continued leukocytosis with a white blood cell count of 11.1, platelets slightly depressed at 144.  CMP this morning with depressed sodium at 131, low potassium at 2.9, creatinine stable/improved at 2.31, AST elevated at 54, ALT normal, alkaline phosphatase elevated at 168, total bili improved at 5.7. CBC this morning shows a 0.6 g drop in hemoglobin to 7.0, continued leukocytosis but stable.  Clinically seems improved somewhat overall. Bili is elevated but slowly  improving. Cr stable. Feels like edema is going down. Wife and patient are both determined to keep pushing forward toward transplant evaluation. Overall he has made impressive progress since initial hospitalization. Does not appear to have ongoing active GI bleed. Mental status clear.   Plan: 1. Potassium replacement per hospitalist 2. We'll attempt to obtain colonoscopy report. 3. Monitor for further GI bleed. 4. Monitor H/H 5. Transfuse as necessary 6. Continued diuresis per nephrology 7. Monitor LFTs. 8. Start Nadolol 40 mg daily for variceal prophylaxis, titrate to HR 55. Can increase slowly/cautiously based on dose response.   Thank you for allowing [place] to participate in the care of [place], DNP, AGNP-C Adult & Gerontological Nurse Practitioner [facility]     LOS: 3 days    03/09/2016, 10:15 AM

## 2016-03-09 NOTE — Op Note (Signed)
[facility] Patient Name: Antonio [name] Procedure Date: 03/08/2016 2:23 PM MRN: [id number] Date of Birth: April 06, 1971 Attending MD: Norvel Richards , MD CSN: [id number] Age: 45 Admit Type: Inpatient Procedure:                Ileo-colonoscopy with snare polypectomy and biopsy Indications:              Hematochezia Providers:                Norvel Richards, MD, Lurline Del, RN, Charlyne Petrin, RN, Randa Spike, Technician Referring MD:              Medicines:                Propofol per Anesthesia Complications:            No immediate complications. Estimated Blood Loss:     Estimated blood loss was minimal. Procedure:                Pre-Anesthesia Assessment:                           - Prior to the procedure, a History and Physical                            was performed, and patient medications and                            allergies were reviewed. The patient's tolerance of                            previous anesthesia was also reviewed. The risks                            and benefits of the procedure and the sedation                            options and risks were discussed with the patient.                            All questions were answered, and informed consent                            was obtained. Prior Anticoagulants: The patient has                            taken no previous anticoagulant or antiplatelet                            agents. ASA Grade Assessment: III - A patient with                            severe systemic disease. After reviewing the risks  and benefits, the patient was deemed in                            satisfactory condition to undergo the procedure.                           After obtaining informed consent, the colonoscope                            was passed under direct vision. Throughout the                            procedure, the patient's blood pressure,  pulse, and                            oxygen saturations were monitored continuously. The                            [id number]) was introduced through the anus                            and advanced to the 5 cm into the ileum. The                            colonoscopy was performed without difficulty. The                            patient tolerated the procedure fairly well. The                            quality of the bowel preparation was adequate. The                            entire colon was well visualized. The terminal                            ileum, ileocecal valve, appendiceal orifice, and                            rectum were photographed. Scope In: 2:54:41 PM Scope Out: 3:21:11 PM Scope Withdrawal Time: 0 hours 11 minutes 34 seconds  Total Procedure Duration: 0 hours 26 minutes 30 seconds  Findings:      The perianal and digital rectal examinations were normal. Prominent       rectal varices present without bleeding stigmata. Diffusely pigmented       rectal mucosa. Left colon mucosa appeared particularly reticulated.       Biopsies of the descending/sigmoid segment taken. The distal 5 cm of       terminal ileum mucosa appeared normal.      A 8 mm polyp was found in the descending colon. The polyp was       semi-pedunculated. The polyp was removed with a hot snare. Resection and       retrieval were complete. Estimated blood loss: none.      A 4 mm polyp was  found in the cecum. The polyp was semi-pedunculated.       The polyp was removed with a cold snare. Resection and retrieval were       complete. Estimated blood loss was minimal. Impression:               - One 8 mm polyp in the descending colon, removed                            with a hot snare. Resected and retrieved.                           - One 4 mm polyp in the cecum, removed with a cold                            snare. Resected and retrieved.                           Rectal varices. Melanosis  coli. Abnormal left colon                            mucosa status post biopsy. Moderate Sedation:      Moderate (conscious) sedation was personally administered by an       anesthesia professional. The following parameters were monitored: oxygen       saturation, heart rate, blood pressure, respiratory rate, EKG, adequacy       of pulmonary ventilation, and response to care. Total physician       intraservice time was 37 minutes. Recommendation:           - Patient has a contact number available for                            emergencies. The signs and symptoms of potential                            delayed complications were discussed with the                            patient. Return to normal activities tomorrow.                            Written discharge instructions were provided to the                            patient.                           - Patient has a contact number available for                            emergencies. The signs and symptoms of potential                            delayed complications were discussed with the  patient. Return to normal activities tomorrow.                            Written discharge instructions were provided to the                            patient.                           - Return patient to ICU for ongoing care. See EGD                            report.                           - Clear liquid diet.                           - Repeat colonoscopy date to be determined after                            pending pathology results are reviewed for                            surveillance based on pathology results.                           - Continue present medications. Procedure Code(s):        --- Professional ---                           [phone], Colonoscopy, flexible; with removal of                            tumor(s), polyp(s), or other lesion(s) by snare                             technique Diagnosis Code(s):        --- Professional ---                           D12.4, Benign neoplasm of descending colon                           D12.0, Benign neoplasm of cecum                           K92.1, Melena (includes Hematochezia) CPT copyright 2016 [employer]. All rights reserved. The codes documented in this report are preliminary and upon coder review may  be revised to meet current compliance requirements. Cristopher Estimable. Florita Nitsch, MD Norvel Richards, MD 03/09/2016 11:21:15 AM This report has been signed electronically. Number of Addenda: 0

## 2016-03-10 LAB — TYPE AND SCREEN
ABO/RH(D): O NEG
Antibody Screen: NEGATIVE
UNIT DIVISION: 0
UNIT DIVISION: 0

## 2016-03-10 LAB — COMPREHENSIVE METABOLIC PANEL
ALT: 22 U/L (ref 17–63)
ANION GAP: 5 (ref 5–15)
AST: 64 U/L — AB (ref 15–41)
Albumin: 2.1 g/dL — ABNORMAL LOW (ref 3.5–5.0)
Alkaline Phosphatase: 204 U/L — ABNORMAL HIGH (ref 38–126)
BILIRUBIN TOTAL: 4.4 mg/dL — AB (ref 0.3–1.2)
BUN: 54 mg/dL — AB (ref 6–20)
CO2: 25 mmol/L (ref 22–32)
Calcium: 8.4 mg/dL — ABNORMAL LOW (ref 8.9–10.3)
Chloride: 102 mmol/L (ref 101–111)
Creatinine, Ser: 2 mg/dL — ABNORMAL HIGH (ref 0.61–1.24)
GFR, EST AFRICAN AMERICAN: 45 mL/min — AB (ref >60)
GFR, EST NON AFRICAN AMERICAN: 39 mL/min — AB (ref >60)
Glucose, Bld: 109 mg/dL — ABNORMAL HIGH (ref 65–99)
POTASSIUM: 3.6 mmol/L (ref 3.5–5.1)
Sodium: 132 mmol/L — ABNORMAL LOW (ref 135–145)
TOTAL PROTEIN: 6.9 g/dL (ref 6.5–8.1)

## 2016-03-10 LAB — HEMOGLOBIN AND HEMATOCRIT, BLOOD
HEMATOCRIT: 23 % — AB (ref 39.0–52.0)
HEMOGLOBIN: 7.5 g/dL — AB (ref 13.0–17.0)

## 2016-03-10 LAB — CBC
HEMATOCRIT: 22.7 % — AB (ref 39.0–52.0)
Hemoglobin: 7.4 g/dL — ABNORMAL LOW (ref 13.0–17.0)
MCH: 30.6 pg (ref 26.0–34.0)
MCHC: 32.6 g/dL (ref 30.0–36.0)
MCV: 93.8 fL (ref 78.0–100.0)
Platelets: 138 10*3/uL — ABNORMAL LOW (ref 150–400)
RBC: 2.42 MIL/uL — ABNORMAL LOW (ref 4.22–5.81)
RDW: 19.5 % — AB (ref 11.5–15.5)
WBC: 11.3 10*3/uL — ABNORMAL HIGH (ref 4.0–10.5)

## 2016-03-10 MED ORDER — FUROSEMIDE 10 MG/ML IJ SOLN
80.0000 mg | Freq: Two times a day (BID) | INTRAMUSCULAR | Status: DC
  Administered 2016-03-10 – 2016-03-13 (×6): 80 mg via INTRAVENOUS
  Filled 2016-03-10 (×6): qty 8

## 2016-03-10 NOTE — Progress Notes (Signed)
PROGRESS NOTE    Antonio [name]  [id number] DOB: 14-Aug-1970 DOA: 03/06/2016 PCP: Deloria Lair, MD    Brief Narrative:  45 y.o. male with medical history significant of ESLD and CKD with morbid obesity.  He presents today for worsening LE edema and anuria. In the outpatient setting, patient was given an increased dose of diuretic with little to no improvement. Patient was found to have worsening renal function on presentation and was admitted for management of volume overload.  Assessment & Plan:   Principal Problem:   Decompensation of cirrhosis of liver (HCC) Active Problems:   Anemia   Anasarca   Thrombocytopenia (HCC)   Acute renal failure (HCC)   Lower extremity edema   Hyponatremia   Hyperglycemia   Protein-calorie malnutrition, severe (HCC)   CHF (congestive heart failure) (HCC)   Rectal bleeding   Hypokalemia   Decompensated cirrhosis of liver -Liver function including bilirubin seems to be improving -Overall prognosis is poor -Patient is not yet on transplant list -Appreciate input by gastroenterology -Patient is continued on lactulose and rifaximin  ARF -Renal function improving -We'll continue IV Lasix per nephrology recommendations -Case discussed with nephrology today -Repeat comprehensive metabolic panel in morning  Hyponatremia -Sodium continuing to improve with diuresis -Suspect secondary to volume overload from liver disease  Acutely decompensated chronic CHF -We'll continue to diuresis as tolerated per above -Patient continues with lower extremity edema, albeit improved -Repeat comprehensive metabolic panel in morning  Suspected acute blood loss Anemia from suspected GI bleeding -Patient was given 1 unit of PRBCs on 02/26/2016 and required another unit of blood on 03/09/2016 -Hemoglobin today 7.4, down from 8.2 after transfusion yesterday -Patient recently noted to have bright red blood per rectum -At discussed case with gastroenterology  who recommends to hold off on blood transfusion for the time being. Also, recommendations to continue to monitor for further bleeding. -We'll repeat CBC in the morning -Patient is status post EGD and colonoscopy on 03/08/2016 with findings of grade 2 esophageal varices and portal hypertensive gastropathy as well as an 8 mm polyp in the descending colon and a 4 mm polyp in the cecum which were both removed. -Patient has received 1 unit of PRBCs on 03/07/2016.  Thrombocytopenia -Suspect secondary to chronic liver disease -Platelets remain stable at this time -Repeat CBC in morning  Hyperglycemia -Blood work in the morning demonstrates stable random glucose -For now, will continue to monitor  Malnutrition -Dietitian consulted -Per gastroenterology, may also help assist with educating on salt restricted diet  Hypokalemia -Corrected. -We'll repeat comprehensive metabolic panel in the morning  DVT prophylaxis: SCDs Code Status: Full code Family Communication: Patient in room, family not at bedside Disposition Plan: Uncertain at this time  Consultants:   Gastroenterology  Nephrology  Procedures:     Antimicrobials: Anti-infectives    Start     Dose/Rate Route Frequency Ordered Stop   03/07/16 1400  cefTRIAXone (ROCEPHIN) 1 g in dextrose 5 % 50 mL IVPB    Comments:  Please dose adjust if needed for renal insufficency   1 g 100 mL/hr over 30 Minutes Intravenous Every 24 hours 03/07/16 1339     03/06/16 1800  rifaximin (XIFAXAN) tablet 550 mg     550 mg Oral 2 times daily 03/06/16 1734        Subjective: Patient is without complaints morning.  Objective: Vitals:   03/10/16 0820 03/10/16 1000 03/10/16 1122 03/10/16 1200  BP: (!) 113/51 (!) 103/44  (!) 93/52  Pulse: 70 70  67 69  Resp: 14 15 15 17   Temp:   97.6 F (36.4 C)   TempSrc:   Oral   SpO2: 100% 100% 100% 100%  Weight:      Height:        Intake/Output Summary (Last 24 hours) at 03/10/16 1604 Last data  filed at 03/10/16 1342  Gross per 24 hour  Intake           1845.5 ml  Output             6600 ml  Net          -4754.5 ml   Filed Weights   03/08/16 0500 03/09/16 0509 03/10/16 0400  Weight: (!) 165.6 kg (365 lb 1.3 oz) (!) 166.1 kg (366 lb 2.9 oz) (!) 168.7 kg (371 lb 14.7 oz)    Examination:  General exam: Lying in bed, no acute distress Respiratory system: Normal respiratory effort, no wheezing Cardiovascular system: Regular rhythm, S1-S2 Gastrointestinal system: Obese, positive bowel sounds Central nervous system: CN II through XII grossly intact, strength intact Extremities: No clubbing, perfused, 2+ pitting bilateral lower extremity edema Skin: Normal skin turgor, no pallor Psychiatry: Normal mood, no auditory hallucinations  Data Reviewed: I have personally reviewed following labs and imaging studies  CBC:  Recent Labs Lab 03/06/16 1300 03/07/16 0604  03/08/16 0552 03/09/16 0445 03/09/16 1334 03/09/16 1853 03/10/16 0454  WBC 10.2 10.6*  --  11.1* 11.1*  --   --  11.3*  NEUTROABS 6.1  --   --   --   --   --   --   --   HGB 7.4* 7.2*  < > 7.6* 7.0* 7.1* 8.2* 7.4*  HCT 23.0* 22.1*  < > 22.9* 22.0* 22.0* 25.3* 22.7*  MCV 92.0 91.3  --  90.9 94.8  --   --  93.8  PLT 149* 149*  --  144* 139*  --   --  138*  < > = values in this interval not displayed. Basic Metabolic Panel:  Recent Labs Lab 03/06/16 1300 03/07/16 0604 03/08/16 0552 03/09/16 0445 03/10/16 0454  NA 128* 129* 130* 131* 132*  K 4.7 4.6 3.5 2.9* 3.6  CL 99* 101 99* 99* 102  CO2 22 21* 23 23 25   GLUCOSE 108* 104* 91 119* 109*  BUN 53* 60* 55* 53* 54*  CREATININE 3.26* 3.58* 2.52* 2.21* 2.00*  CALCIUM 8.7* 8.7* 8.7* 8.3* 8.4*  MG  --   --   --  1.8  --    GFR: Estimated Creatinine Clearance: 78.7 mL/min (by C-G formula based on SCr of 2 mg/dL (H)). Liver Function Tests:  Recent Labs Lab 03/06/16 1300 03/08/16 0552 03/09/16 0445 03/10/16 0454  AST 61* 56* 54* 64*  ALT 23 21 20 22     ALKPHOS 164* 141* 168* 204*  BILITOT 7.0* 7.0* 5.7* 4.4*  PROT 7.6 7.2 6.8 6.9  ALBUMIN 2.3* 2.2* 2.0* 2.1*    Recent Labs Lab 03/06/16 1300  LIPASE 59*    Recent Labs Lab 03/06/16 1732  AMMONIA 129*   Coagulation Profile:  Recent Labs Lab 03/06/16 1300 03/08/16 0552  INR 2.18 2.18   Cardiac Enzymes: No results for input(s): CKTOTAL, CKMB, CKMBINDEX, TROPONINI in the last 168 hours. BNP (last 3 results) No results for input(s): PROBNP in the last 8760 hours. HbA1C: No results for input(s): HGBA1C in the last 72 hours. CBG: No results for input(s): GLUCAP in the last 168 hours. Lipid Profile: No results for  input(s): CHOL, HDL, LDLCALC, TRIG, CHOLHDL, LDLDIRECT in the last 72 hours. Thyroid Function Tests: No results for input(s): TSH, T4TOTAL, FREET4, T3FREE, THYROIDAB in the last 72 hours. Anemia Panel: No results for input(s): VITAMINB12, FOLATE, FERRITIN, TIBC, IRON, RETICCTPCT in the last 72 hours. Sepsis Labs: No results for input(s): PROCALCITON, LATICACIDVEN in the last 168 hours.  Recent Results (from the past 240 hour(s))  MRSA PCR Screening     Status: None   Collection Time: 03/06/16  6:00 PM  Result Value Ref Range Status   MRSA by PCR NEGATIVE NEGATIVE Final    Comment:        The GeneXpert MRSA Assay (FDA approved for NASAL specimens only), is one component of a comprehensive MRSA colonization surveillance program. It is not intended to diagnose MRSA infection nor to guide or monitor treatment for MRSA infections.      Radiology Studies: Dg Chest Port 1 View  Result Date: 03/09/2016 CLINICAL DATA:  Hypoxia and rectal bleeding EXAM: PORTABLE CHEST 1 VIEW COMPARISON:  Chest radiograph 03/06/2016 FINDINGS: Cardiac silhouette remains enlarged. There is slightly improved aeration of the lung bases with associated slight decrease in pulmonary edema. No pneumothorax or sizable pleural effusion. No focal airspace consolidation. IMPRESSION:  Slightly improved mild pulmonary edema. Electronically Signed   By: Ulyses Jarred M.D.   On: 03/09/2016 13:52    Scheduled Meds: . cefTRIAXone (ROCEPHIN)  IV  1 g Intravenous Q24H  . docusate sodium  100 mg Oral BID  . feeding supplement (PRO-STAT SUGAR FREE 64)  45 mL Oral TID  . folic acid  1 mg Oral Daily  . furosemide  80 mg Intravenous BID  . lactulose  20 g Oral TID  . magnesium oxide  400 mg Oral BID  . nadolol  40 mg Oral Daily  . pantoprazole  40 mg Oral BID AC  . rifaximin  550 mg Oral BID  . sodium chloride flush  3 mL Intravenous Q12H  . thiamine  100 mg Oral Daily   Continuous Infusions:    LOS: 4 days   Milaya Hora, Orpah Melter, MD [facility] Pager [phone]  If 7PM-7AM, please contact night-coverage [email or web address] Password [email or web address] 03/10/2016, 4:04 PM

## 2016-03-10 NOTE — Progress Notes (Signed)
Antonio [name]  MRN: [id number]  DOB/AGE: [birth year] 45 y.o.  Primary Care Physician:TAPPER,DAVID B, MD  Admit date: 03/06/2016  Chief Complaint:  Chief Complaint  Patient presents with  . Leg Swelling    S-Pt presented on  03/06/2016 with  Chief Complaint  Patient presents with  . Leg Swelling  .    Pt says " I feel better, my bleeding in stool is going down"   Meds . cefTRIAXone (ROCEPHIN)  IV  1 g Intravenous Q24H  . docusate sodium  100 mg Oral BID  . feeding supplement (PRO-STAT SUGAR FREE 64)  45 mL Oral TID  . folic acid  1 mg Oral Daily  . furosemide  160 mg Intravenous BID  . lactulose  20 g Oral TID  . magnesium oxide  400 mg Oral BID  . nadolol  40 mg Oral Daily  . pantoprazole  40 mg Oral BID AC  . rifaximin  550 mg Oral BID  . sodium chloride flush  3 mL Intravenous Q12H  . thiamine  100 mg Oral Daily      Physical Exam: Vital signs in last 24 hours: Temp:  [97.1 F (36.2 C)-98.9 F (37.2 C)] 97.6 F (36.4 C) (10/14 1122) Pulse Rate:  [67-83] 67 (10/14 1122) Resp:  [13-22] 15 (10/14 1122) BP: (90-113)/(39-54) 113/51 (10/14 0820) SpO2:  [98 %-100 %] 100 % (10/14 1122) Weight:  [371 lb 14.7 oz (168.7 kg)] 371 lb 14.7 oz (168.7 kg) (10/14 0400) Weight change: 5 lb 11.7 oz (2.6 kg) Last BM Date: 03/10/16  Intake/Output from previous day: 10/13 0701 - 10/14 0700 In: 2680.7 [P.O.:1772; I.V.:58.2; Blood:602.5; IV Piggyback:248] Out: 7400 [Urine:7400] No intake/output data recorded.   Physical Exam: General- pt is awake,alert, follows commands Resp- No acute REsp distress,decreased bs at bases + CVS- S1S2 regular in rate and rhythm GIT- BS+, soft, distended, morbidly obese EXT- 2+ LE Edema, No Cyanosis   Lab Results: CBC  Recent Labs  03/09/16 0445  03/09/16 1853 03/10/16 0454  WBC 11.1*  --   --  11.3*  HGB 7.0*  < > 8.2* 7.4*  HCT 22.0*  < > 25.3* 22.7*  PLT 139*  --   --  138*  < > = values in this interval not  displayed.  BMET  Recent Labs  03/09/16 0445 03/10/16 0454  NA 131* 132*  K 2.9* 3.6  CL 99* 102  CO2 23 25  GLUCOSE 119* 109*  BUN 53* 54*  CREATININE 2.21* 2.00*  CALCIUM 8.3* 8.4*   Creat trend 2017   3.26==>3.58==>2.52=>2.2=>2.0 2017  1.2--2.0( august admission)     MICRO Recent Results (from the past 240 hour(s))  MRSA PCR Screening     Status: None   Collection Time: 03/06/16  6:00 PM  Result Value Ref Range Status   MRSA by PCR NEGATIVE NEGATIVE Final    Comment:        The GeneXpert MRSA Assay (FDA approved for NASAL specimens only), is one component of a comprehensive MRSA colonization surveillance program. It is not intended to diagnose MRSA infection nor to guide or monitor treatment for MRSA infections.       Lab Results  Component Value Date   CALCIUM 8.4 (L) 03/10/2016   PHOS 4.4 01/17/2016         Impression: 1)Renal  AKI secondary to Prerenal/ATN                AKI possibly Hepatorenal  Data against hepatorenal                Urine Na more than 10                Not Oliguric                   AKI improving  2)HTN  BP stable  Medication- On Diuretics-  3)Anemia HGb  Low. 8.2=>7.4 Sec to GI bleeding received [facility] GI and  Primary team  following  4)Anasarca- on Diuretics  5)Thrombocytopenia Sec to Liver related issues Primary MD follwoing following  6)Electrolytes  Hypokalemic      Now better  Hyponatremic     Hypervolemia hyponatremia     improving  7)Acid base Co2 at goal     Plan:  Will reduce lasix dose as swelling better, creat better(pt negative by  9 liters approx)  Will follow bmet        [place] 03/10/2016, 1:03 PM

## 2016-03-10 NOTE — Progress Notes (Signed)
Subjective:  Patient complaints about the food. Inconsistencies from dietary staff regarding what he is allow to have. Nursing looking into it. BM this morning with much less blood noted. Feels like swelling is improved. Wife present today. We discuss 2 gram sodium diet. She has not spoken to dietician about this and needs some education. She no longer uses salt shaker but patient consuming processed meats, etc with high sodium content.   Objective: Vital signs in last 24 hours: Temp:  [97.1 F (36.2 C)-98.9 F (37.2 C)] 97.3 F (36.3 C) (10/14 0740) Pulse Rate:  [70-85] 81 (10/14 0740) Resp:  [13-22] 18 (10/14 0740) BP: (94-107)/(39-54) 97/42 (10/14 0600) SpO2:  [98 %-100 %] 100 % (10/14 0740) Weight:  [371 lb 14.7 oz (168.7 kg)] 371 lb 14.7 oz (168.7 kg) (10/14 0400)  331# on 03/06/16 Last BM Date: 03/08/16 General:   Alert,  Well-developed, well-nourished, pleasant and cooperative in NAD. Wife at bedside Head:  Normocephalic and atraumatic. Eyes:  Sclera clear, no icterus.  Abdomen:  Soft, obese. 2+ pitting edema in dependent flanks. Normal bowel sounds, without guarding, and without rebound.   Extremities:  Without clubbing, deformity. 2+ pitting edema at knees, 3+ at ankles bilaterally. Neurologic:  Alert and  oriented x4;  grossly normal neurologically. Skin:  Intact without significant lesions or rashes. Psych:  Alert and cooperative. Normal mood and affect.  Intake/Output from previous day: 10/13 0701 - 10/14 0700 In: 2680.7 [P.O.:1772; I.V.:58.2; Blood:602.5; IV Piggyback:248] Out: 7400 [Urine:7400] Intake/Output this shift: No intake/output data recorded.  Lab Results: CBC  Recent Labs  03/08/16 0552 03/09/16 0445 03/09/16 1334 03/09/16 1853 03/10/16 0454  WBC 11.1* 11.1*  --   --  11.3*  HGB 7.6* 7.0* 7.1* 8.2* 7.4*  HCT 22.9* 22.0* 22.0* 25.3* 22.7*  MCV 90.9 94.8  --   --  93.8  PLT 144* 139*  --   --  138*   BMET  Recent Labs  03/08/16 0552  03/09/16 0445 03/10/16 0454  NA 130* 131* 132*  K 3.5 2.9* 3.6  CL 99* 99* 102  CO2 23 23 25   GLUCOSE 91 119* 109*  BUN 55* 53* 54*  CREATININE 2.52* 2.21* 2.00*  CALCIUM 8.7* 8.3* 8.4*   LFTs  Recent Labs  03/08/16 0552 03/09/16 0445 03/10/16 0454  BILITOT 7.0* 5.7* 4.4*  ALKPHOS 141* 168* 204*  AST 56* 54* 64*  ALT 21 20 22   PROT 7.2 6.8 6.9  ALBUMIN 2.2* 2.0* 2.1*   No results for input(s): LIPASE in the last 72 hours. PT/INR  Recent Labs  03/08/16 0552  LABPROT 24.6*  INR 2.18      Imaging Studies: Dg Chest 2 View  Result Date: 03/06/2016 CLINICAL DATA:  Dyspnea. EXAM: CHEST  2 VIEW COMPARISON:  01/09/2016 chest radiograph. FINDINGS: Stable cardiomediastinal silhouette with mild cardiomegaly. No pneumothorax. No pleural effusion. Mild pulmonary edema. No acute consolidative airspace disease. IMPRESSION: Mild congestive heart failure. Electronically Signed   By: Ilona Sorrel M.D.   On: 03/06/2016 13:26   US Renal  Result Date: 03/07/2016 CLINICAL DATA:  Recently diagnosed with acute tubular necrosis. History of CHF, alcoholic hepatitis and cirrhosis. EXAM: RENAL / URINARY TRACT ULTRASOUND COMPLETE COMPARISON:  None in PACs FINDINGS: Right Kidney: Length: 13.3 cm. The renal cortical echotexture is approximately equal to that of the adjacent liver. There is no cortical atrophy. There is no hydronephrosis. No stones are evident. Left Kidney: Length: 12.8 cm. The renal cortical echotexture is similar to that on  the right. There is no hydronephrosis. No cortical atrophy. No evidence of stones. Bladder: The urinary bladder is decompressed with a Foley catheter. IMPRESSION: Slightly increased renal cortical echotexture as compared to the liver though this may be spurring this given the patient's known cirrhosis. No evidence of cortical atrophy or obstruction. Nondistended urinary bladder with Foley catheter present. Electronically Signed   By: David  Martinique M.D.   On:  03/07/2016 11:36   Dg Chest Port 1 View  Result Date: 03/09/2016 CLINICAL DATA:  Hypoxia and rectal bleeding EXAM: PORTABLE CHEST 1 VIEW COMPARISON:  Chest radiograph 03/06/2016 FINDINGS: Cardiac silhouette remains enlarged. There is slightly improved aeration of the lung bases with associated slight decrease in pulmonary edema. No pneumothorax or sizable pleural effusion. No focal airspace consolidation. IMPRESSION: Slightly improved mild pulmonary edema. Electronically Signed   By: Ulyses Jarred M.D.   On: 03/09/2016 13:52  [2 weeks]   Assessment: 45 year old gentleman with history of alcoholic cirrhosis who presents with complaints of anasarca, anuria. Patient seen on 02/08/2016 as an outpatient. 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  He had marked improvement in his biochemical markers with creatinine 1.41, sodium 134, INR 1.6, total bilirubin 4.2. Presented this admission with increasing creatinine, hyponatremia, rising bilirubin and INR, patient adamantly denied relapse with etoh use.Renal function has improved this admission.   Day of admission, there was quetionable melena by report and decline in hemoglobin from September to now of approximately 2 g/dL. EGD 12/2015 with evidence of portal gastropathy and grade 1 and 2 esophageal varices neither were actively bleeding during recent hospitalization.   On the afternoon of 03/07/2016 the patient was noted to have passed bright red blood per rectum without stool. Rocephin was started for prophylaxis for SBP. 03/08/16, Upper endoscopy was performed without grade 2 varices in the lower third of the esophagus without bleeding stigmata and overlying mucosa otherwise normal. Portal hypertensive gastropathy throughout the stomach, no gastric ulcer or varices, patent pylorus. Colonoscopy with rectal varices without stigmata of bleeding, rectal mucosa abnormal  left colon s/p bx (reticulated), polyp removed from descending colon and cecum. path pending.     Patient had further bleeding yesterday afternoon, fresh blood but felt to be less than initial episode. In setting of INR >2, bleeding not unexpected s/p polypectomy. This morning bleeding tapering off. Discussed need to AVOID straining and prolonged toilet time given rectal varices.   Plan: 1. Continue to monitor for further GI bleeding, transfuse if needed. INR >2 with no response to single dose of vitamin K in setting of cirrhosis. FFP not recommended. Minimal thrombocytopenia noted.  2. Target heartrate in the 55 range due to varicies. Nadolol reinitiated yesterday, has received two doses, heart rate in the 70 range. Consider increase tomorrow if tolerated and target heart rate not reached.   3. Complete seven days of rocephin.  4. Weight up 40 pounds this admission. Consider albumin.  5. Will have dietician give instructions for 2 gram sodium diet to patient AND wife.  6. Will continue to follow with you.   Laureen Ochs. Bernarda Caffey [facility] [phone] 10/14/20171:45 PM     LOS: 4 days

## 2016-03-10 NOTE — Plan of Care (Signed)
Problem: Safety: Goal: Ability to remain free from injury will improve Outcome: Progressing Patient has remained free form injury at this time. Patient's call light is in reach, and he is educated on calling if he needs to get up or be repositioned. Bed is in its lowest position and socks are on.  Problem: Pain Managment: Goal: General experience of comfort will improve Outcome: Progressing Patient has been free of pain.  Problem: Physical Regulation: Goal: Will remain free from infection Outcome: Progressing Patient is free from infection at this moment  Problem: Activity: Goal: Risk for activity intolerance will decrease Outcome: Progressing Patient is doing well with walking back and forth to [facility]. Becomes a little SOB, but it decreases with rest.

## 2016-03-11 ENCOUNTER — Inpatient Hospital Stay (HOSPITAL_COMMUNITY): Payer: BLUE CROSS/BLUE SHIELD

## 2016-03-11 DIAGNOSIS — R7989 Other specified abnormal findings of blood chemistry: Secondary | ICD-10-CM

## 2016-03-11 DIAGNOSIS — R739 Hyperglycemia, unspecified: Secondary | ICD-10-CM

## 2016-03-11 LAB — COMPREHENSIVE METABOLIC PANEL
ALT: 20 U/L (ref 17–63)
AST: 60 U/L — ABNORMAL HIGH (ref 15–41)
Albumin: 2.1 g/dL — ABNORMAL LOW (ref 3.5–5.0)
Alkaline Phosphatase: 197 U/L — ABNORMAL HIGH (ref 38–126)
Anion gap: 6 (ref 5–15)
BUN: 51 mg/dL — ABNORMAL HIGH (ref 6–20)
CHLORIDE: 102 mmol/L (ref 101–111)
CO2: 24 mmol/L (ref 22–32)
Calcium: 8.3 mg/dL — ABNORMAL LOW (ref 8.9–10.3)
Creatinine, Ser: 1.69 mg/dL — ABNORMAL HIGH (ref 0.61–1.24)
GFR, EST AFRICAN AMERICAN: 56 mL/min — AB (ref >60)
GFR, EST NON AFRICAN AMERICAN: 48 mL/min — AB (ref >60)
Glucose, Bld: 100 mg/dL — ABNORMAL HIGH (ref 65–99)
POTASSIUM: 3.1 mmol/L — AB (ref 3.5–5.1)
Sodium: 132 mmol/L — ABNORMAL LOW (ref 135–145)
Total Bilirubin: 4.5 mg/dL — ABNORMAL HIGH (ref 0.3–1.2)
Total Protein: 7 g/dL (ref 6.5–8.1)

## 2016-03-11 LAB — CBC
HCT: 23 % — ABNORMAL LOW (ref 39.0–52.0)
Hemoglobin: 7.5 g/dL — ABNORMAL LOW (ref 13.0–17.0)
MCH: 30.5 pg (ref 26.0–34.0)
MCHC: 32.6 g/dL (ref 30.0–36.0)
MCV: 93.5 fL (ref 78.0–100.0)
PLATELETS: 141 10*3/uL — AB (ref 150–400)
RBC: 2.46 MIL/uL — ABNORMAL LOW (ref 4.22–5.81)
RDW: 19.8 % — AB (ref 11.5–15.5)
WBC: 9.5 10*3/uL (ref 4.0–10.5)

## 2016-03-11 MED ORDER — POTASSIUM CHLORIDE CRYS ER 20 MEQ PO TBCR
60.0000 meq | EXTENDED_RELEASE_TABLET | Freq: Once | ORAL | Status: AC
  Administered 2016-03-11: 60 meq via ORAL
  Filled 2016-03-11: qty 3

## 2016-03-11 MED ORDER — OXYCODONE HCL 5 MG PO TABS
5.0000 mg | ORAL_TABLET | Freq: Four times a day (QID) | ORAL | Status: DC | PRN
  Administered 2016-03-11 – 2016-03-23 (×4): 5 mg via ORAL
  Filled 2016-03-11 (×5): qty 1

## 2016-03-11 NOTE — Progress Notes (Signed)
Antonio [name]  MRN: [id number]  DOB/AGE: [birth year] 45 y.o.  Primary Care Physician:TAPPER,DAVID B, MD  Admit date: 03/06/2016  Chief Complaint:  Chief Complaint  Patient presents with  . Leg Swelling    S-Pt presented on  03/06/2016 with  Chief Complaint  Patient presents with  . Leg Swelling  .    Pt main concern was " I just got this picc line"   Meds . cefTRIAXone (ROCEPHIN)  IV  1 g Intravenous Q24H  . docusate sodium  100 mg Oral BID  . feeding supplement (PRO-STAT SUGAR FREE 64)  45 mL Oral TID  . folic acid  1 mg Oral Daily  . furosemide  80 mg Intravenous BID  . lactulose  20 g Oral TID  . magnesium oxide  400 mg Oral BID  . nadolol  40 mg Oral Daily  . pantoprazole  40 mg Oral BID AC  . rifaximin  550 mg Oral BID  . sodium chloride flush  3 mL Intravenous Q12H  . thiamine  100 mg Oral Daily      Physical Exam: Vital signs in last 24 hours: Temp:  [97.3 F (36.3 C)-98.3 F (36.8 C)] 98.3 F (36.8 C) (10/15 0400) Pulse Rate:  [67-81] 69 (10/15 0600) Resp:  [13-20] 18 (10/15 0600) BP: (90-113)/(43-61) 104/56 (10/15 0600) SpO2:  [91 %-100 %] 100 % (10/15 0600) Weight change:  Last BM Date: 03/10/16  Intake/Output from previous day: 10/14 0701 - 10/15 0700 In: 1410 [P.O.:1360; IV Piggyback:50] Out: [id number] [Urine:3200] No intake/output data recorded.   Physical Exam: General- pt is awake,alert, follows commands Resp- No acute REsp distress,decreased bs at bases + CVS- S1S2 regular in rate and rhythm GIT- BS+, soft, distended, morbidly obese EXT- 1+ LE Edema, No Cyanosis   Lab Results: CBC  Recent Labs  03/10/16 0454 03/10/16 1612 03/11/16 0448  WBC 11.3*  --  9.5  HGB 7.4* 7.5* 7.5*  HCT 22.7* 23.0* 23.0*  PLT 138*  --  141*    BMET  Recent Labs  03/10/16 0454 03/11/16 0448  NA 132* 132*  K 3.6 3.1*  CL 102 102  CO2 25 24  GLUCOSE 109* 100*  BUN 54* 51*  CREATININE 2.00* 1.69*  CALCIUM 8.4* 8.3*   Creat trend 2017    3.26==>3.58==>2.52=>2.2=>2.0==>1.69 2017  1.2--2.0( august admission)     MICRO Recent Results (from the past 240 hour(s))  MRSA PCR Screening     Status: None   Collection Time: 03/06/16  6:00 PM  Result Value Ref Range Status   MRSA by PCR NEGATIVE NEGATIVE Final    Comment:        The GeneXpert MRSA Assay (FDA approved for NASAL specimens only), is one component of a comprehensive MRSA colonization surveillance program. It is not intended to diagnose MRSA infection nor to guide or monitor treatment for MRSA infections.       Lab Results  Component Value Date   CALCIUM 8.3 (L) 03/11/2016   PHOS 4.4 01/17/2016         Impression: 1)Renal  AKI secondary to Prerenal/ATN                AKI possibly Hepatorenal                Data against hepatorenal                Urine Na more than 10  Not Oliguric                   AKI improving  2)HTN  BP stable  Medication- On Diuretics-  3)Anemia HGb  Low. 8.2=>7.4 Sec to GI bleeding received [facility] GI and  Primary team  following  4)Anasarca- on Diuretics  5)Thrombocytopenia Sec to Liver related issues Primary MD follwoing following  6)Electrolytes  Hypokalemic      Now better  Hyponatremic     Hypervolemia hyponatremia     improving  7)Acid base Co2 at goal     Plan:  Will continue current care Will follow bmet        [place] S 03/11/2016, 7:03 AM

## 2016-03-11 NOTE — Progress Notes (Signed)
Subjective:  Patient complains of pain related to swelling. Feels like swelling is worse. Very little blood per rectum yesterday. 2-3 soft stools daily. Appetite is good.   Objective: Vital signs in last 24 hours: Temp:  [97.4 F (36.3 C)-98.3 F (36.8 C)] 98.1 F (36.7 C) (10/15 0737) Pulse Rate:  [67-77] 69 (10/15 0737) Resp:  [11-20] 11 (10/15 0737) BP: (93-113)/(43-61) 104/56 (10/15 0600) SpO2:  [91 %-100 %] 100 % (10/15 0737) Last BM Date: 03/10/16 General:   Alert,  Well-developed, well-nourished, pleasant and cooperative in NAD Head:  Normocephalic and atraumatic. Eyes:  Sclera clear, no icterus.  Abdomen:  Soft, distended. 2+ pitting edema in flanks. Normal bowel sounds, without guarding, and without rebound.   Extremities:  Without clubbing, deformity. 3+ pitting edema at ankles bilaterally, 2+ at thighs. Neurologic:  Alert and  oriented x4;  grossly normal neurologically. Skin:  Intact without significant lesions or rashes. Psych:  Alert and cooperative. Normal mood and affect.  Intake/Output from previous day: 10/14 0701 - 10/15 0700 In: 1410 [P.O.:1360; IV Piggyback:50] Out: 3200 [Urine:3200] Intake/Output this shift: No intake/output data recorded.  Lab Results: CBC  Recent Labs  03/09/16 0445  03/10/16 0454 03/10/16 1612 03/11/16 0448  WBC 11.1*  --  11.3*  --  9.5  HGB 7.0*  < > 7.4* 7.5* 7.5*  HCT 22.0*  < > 22.7* 23.0* 23.0*  MCV 94.8  --  93.8  --  93.5  PLT 139*  --  138*  --  141*  < > = values in this interval not displayed. BMET  Recent Labs  03/09/16 0445 03/10/16 0454 03/11/16 0448  NA 131* 132* 132*  K 2.9* 3.6 3.1*  CL 99* 102 102  CO2 23 25 24   GLUCOSE 119* 109* 100*  BUN 53* 54* 51*  CREATININE 2.21* 2.00* 1.69*  CALCIUM 8.3* 8.4* 8.3*   LFTs  Recent Labs  03/09/16 0445 03/10/16 0454 03/11/16 0448  BILITOT 5.7* 4.4* 4.5*  ALKPHOS 168* 204* 197*  AST 54* 64* 60*  ALT 20 22 20   PROT 6.8 6.9 7.0  ALBUMIN 2.0* 2.1* 2.1*    No results for input(s): LIPASE in the last 72 hours. PT/INR No results for input(s): LABPROT, INR in the last 72 hours.    Imaging Studies: Dg Chest 2 View  Result Date: 03/06/2016 CLINICAL DATA:  Dyspnea. EXAM: CHEST  2 VIEW COMPARISON:  01/09/2016 chest radiograph. FINDINGS: Stable cardiomediastinal silhouette with mild cardiomegaly. No pneumothorax. No pleural effusion. Mild pulmonary edema. No acute consolidative airspace disease. IMPRESSION: Mild congestive heart failure. Electronically Signed   By: Ilona Sorrel M.D.   On: 03/06/2016 13:26   US Renal  Result Date: 03/07/2016 CLINICAL DATA:  Recently diagnosed with acute tubular necrosis. History of CHF, alcoholic hepatitis and cirrhosis. EXAM: RENAL / URINARY TRACT ULTRASOUND COMPLETE COMPARISON:  None in PACs FINDINGS: Right Kidney: Length: 13.3 cm. The renal cortical echotexture is approximately equal to that of the adjacent liver. There is no cortical atrophy. There is no hydronephrosis. No stones are evident. Left Kidney: Length: 12.8 cm. The renal cortical echotexture is similar to that on the right. There is no hydronephrosis. No cortical atrophy. No evidence of stones. Bladder: The urinary bladder is decompressed with a Foley catheter. IMPRESSION: Slightly increased renal cortical echotexture as compared to the liver though this may be spurring this given the patient's known cirrhosis. No evidence of cortical atrophy or obstruction. Nondistended urinary bladder with Foley catheter present. Electronically Signed  By: David  Martinique M.D.   On: 03/07/2016 11:36   Dg Chest Port 1 View  Result Date: 03/09/2016 CLINICAL DATA:  Hypoxia and rectal bleeding EXAM: PORTABLE CHEST 1 VIEW COMPARISON:  Chest radiograph 03/06/2016 FINDINGS: Cardiac silhouette remains enlarged. There is slightly improved aeration of the lung bases with associated slight decrease in pulmonary edema. No pneumothorax or sizable pleural effusion. No focal airspace  consolidation. IMPRESSION: Slightly improved mild pulmonary edema. Electronically Signed   By: Ulyses Jarred M.D.   On: 03/09/2016 13:52  [2 weeks]   Assessment: 45 year old gentleman with history of alcoholic cirrhosis who presents with complaints of anasarca, anuria. Patient seen on 02/08/2016 as an outpatient. 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     Day of admission, there was quetionable melena by report and decline in hemoglobin from September to now of approximately 2 g/dL. EGD 12/2015 with evidence of portal gastropathy and grade 1 and 2 esophageal varices neither were actively bleeding during recent hospitalization.   On the afternoon of 03/07/2016 the patient was noted to have passed bright red blood per rectum without stool. Rocephin was started for prophylaxis for SBP. 03/08/16, Upper endoscopy was performed without grade 2 varices in the lower third of the esophagus without bleeding stigmata and overlying mucosa otherwise normal. Portal hypertensive gastropathy throughout the stomach, no gastric ulcer or varices, patent pylorus. Colonoscopy with rectal varices without stigmata of bleeding, rectal mucosa abnormal left colon s/p bx (reticulated), polyp removed from descending colon and cecum. path pending.     Patient had further bleeding Friday afternoon, fresh blood but felt to be less than initial episode. In setting of INR >2, bleeding not unexpected s/p polypectomy. This morning bleeding tapering off. Discussed need to AVOID straining and prolonged toilet time given rectal varices. Bleeding has tapered off significantly as of 03/11/16.   Anasarca remains significant problem. Weight up 40 pounds as of yesterday, today's weight is not available yet. Clinically on exam his edema is worsened today. His albumin is 2.2. Urine output is good.   Plan: 1. Target heart rate advised for 55 range due  to varices. He is now in the 60 range on Nadolol for two days. Will reevaluate tomorrow, if not target range by then, consider increasing dose.  2. Complete 7 days of Rocephin.  3. Discussed possibility of albumin runs with nephrology who advised holding off at this time from their standpoint. Await today's weight.  4. Will continue to follow with you.   Laureen Ochs. Bernarda Caffey [facility] [phone] 10/15/20178:39 AM     LOS: 5 days

## 2016-03-11 NOTE — Progress Notes (Signed)
PROGRESS NOTE    Antonio [name]  [id number] DOB: July 10, 1970 DOA: 03/06/2016 PCP: Deloria Lair, MD    Brief Narrative:  45 y.o. male with medical history significant of ESLD and CKD with morbid obesity.  He presents today for worsening LE edema and anuria. In the outpatient setting, patient was given an increased dose of diuretic with little to no improvement. Patient was found to have worsening renal function on presentation and was admitted for management of volume overload.  Assessment & Plan:   Principal Problem:   Decompensation of cirrhosis of liver (HCC) Active Problems:   Anemia   Anasarca   Thrombocytopenia (HCC)   Acute renal failure (HCC)   Lower extremity edema   Hyponatremia   Hyperglycemia   Protein-calorie malnutrition, severe (HCC)   CHF (congestive heart failure) (HCC)   Rectal bleeding   Hypokalemia   Decompensated cirrhosis of liver -Overall, liver function profile seems to be improved since hospital admission -Despite this, patient's overall prognosis remains poor -Patient not yet on transplant list officially -Repeat INR in morning  ARF -Renal function is improving -Appreciate input by nephrology -Continue IV Lasix at 80 mg twice a day -Patient with suboptimal IV access. Have requested PICC team for PICC line placement today.  Hyponatremia -Improving -Continue diuretics as tolerated  Acutely decompensated chronic CHF -Patient is continued on IV lasix per nephrology -Lower extremity edema appears worse today -Will defer to Nephrology re: medication dose adjustments -We'll continue to diuresis as tolerated per above  Suspected acute blood loss Anemia from suspected GI bleeding -Thus far during this admission, patient has received 1 unit PRBCs on 03/07/2016 and 1 unit on 03/09/2016. -Hemoglobin thus far remain stable at 7.5 -Patient noted to have recent bright red blood per rectum that seems to be improving -Gastroenterology is  following, no plans for repeat endoscopy at this time -Patient is status post EGD and colonoscopy on 03/08/2016 with findings of grade 2 esophageal varices and portal hypertensive gastropathy as well as an 8 mm polyp in the descending colon and a 4 mm polyp in the cecum which were both removed. -We'll repeat CBC in the morning.  Thrombocytopenia -Low platelets likely secondary to chronic liver disease -Repeat CBC in the morning  Hyperglycemia -Random glucose this morning of 100 -Continuing to monitor for the time being  Malnutrition -Patient had been seen by dietitian -Appreciate input by nutrition/dietitian  Hypokalemia -Potassium lower today to 3.1 -Replaced -Repeat comprehensive panel morning  DVT prophylaxis: SCDs Code Status: Full code Family Communication: Patient in room, family not at bedside Disposition Plan: Uncertain at this time  Consultants:   Gastroenterology  Nephrology  Procedures:     Antimicrobials: Anti-infectives    Start     Dose/Rate Route Frequency Ordered Stop   03/07/16 1400  cefTRIAXone (ROCEPHIN) 1 g in dextrose 5 % 50 mL IVPB    Comments:  Please dose adjust if needed for renal insufficency   1 g 100 mL/hr over 30 Minutes Intravenous Every 24 hours 03/07/16 1339 03/14/16 1359   03/06/16 1800  rifaximin (XIFAXAN) tablet 550 mg     550 mg Oral 2 times daily 03/06/16 1734        Subjective: Reports worse lower extremity edema and associated pain  Objective: Vitals:   03/11/16 1030 03/11/16 1134 03/11/16 1200 03/11/16 1516  BP: (!) 100/48  (!) 104/51 (!) 112/54  Pulse: 67 66 (!) 37 65  Resp: 18 16 18 18   Temp:  (!) 96.9 F (36.1 C)  97.7 F (36.5 C)  TempSrc:  Oral  Oral  SpO2: 100% 99% 100% 100%  Weight:      Height:    6\' 2"  (1.88 m)    Intake/Output Summary (Last 24 hours) at 03/11/16 1652 Last data filed at 03/11/16 1208  Gross per 24 hour  Intake             1080 ml  Output             2250 ml  Net            -1170  ml   Filed Weights   03/08/16 0500 03/09/16 0509 03/10/16 0400  Weight: (!) 165.6 kg (365 lb 1.3 oz) (!) 166.1 kg (366 lb 2.9 oz) (!) 168.7 kg (371 lb 14.7 oz)    Examination:  General exam: Awake, conversant, in no acute distress Respiratory system: Normal chest rise, no crackles, speaking in full sentences Cardiovascular system: Regular rate, S1-S2 Gastrointestinal system: Soft, nondistended, positive bowel sounds Central nervous system: No tremors, no seizures Extremities: No cyanosis, 2-3+ pitting bilateral lower extremity edema that appears worse than yesterday Skin: No rashes, no notable skin lesions seen Psychiatry: Normal affect, no visual hallucinations  Data Reviewed: I have personally reviewed following labs and imaging studies  CBC:  Recent Labs Lab 03/06/16 1300 03/07/16 0604  03/08/16 0552 03/09/16 0445 03/09/16 1334 03/09/16 1853 03/10/16 0454 03/10/16 1612 03/11/16 0448  WBC 10.2 10.6*  --  11.1* 11.1*  --   --  11.3*  --  9.5  NEUTROABS 6.1  --   --   --   --   --   --   --   --   --   HGB 7.4* 7.2*  < > 7.6* 7.0* 7.1* 8.2* 7.4* 7.5* 7.5*  HCT 23.0* 22.1*  < > 22.9* 22.0* 22.0* 25.3* 22.7* 23.0* 23.0*  MCV 92.0 91.3  --  90.9 94.8  --   --  93.8  --  93.5  PLT 149* 149*  --  144* 139*  --   --  138*  --  141*  < > = values in this interval not displayed. Basic Metabolic Panel:  Recent Labs Lab 03/07/16 0604 03/08/16 0552 03/09/16 0445 03/10/16 0454 03/11/16 0448  NA 129* 130* 131* 132* 132*  K 4.6 3.5 2.9* 3.6 3.1*  CL 101 99* 99* 102 102  CO2 21* 23 23 25 24   GLUCOSE 104* 91 119* 109* 100*  BUN 60* 55* 53* 54* 51*  CREATININE 3.58* 2.52* 2.21* 2.00* 1.69*  CALCIUM 8.7* 8.7* 8.3* 8.4* 8.3*  MG  --   --  1.8  --   --    GFR: Estimated Creatinine Clearance: 93.1 mL/min (by C-G formula based on SCr of 1.69 mg/dL (H)). Liver Function Tests:  Recent Labs Lab 03/06/16 1300 03/08/16 0552 03/09/16 0445 03/10/16 0454 03/11/16 0448  AST 61*  56* 54* 64* 60*  ALT 23 21 20 22 20   ALKPHOS 164* 141* 168* 204* 197*  BILITOT 7.0* 7.0* 5.7* 4.4* 4.5*  PROT 7.6 7.2 6.8 6.9 7.0  ALBUMIN 2.3* 2.2* 2.0* 2.1* 2.1*    Recent Labs Lab 03/06/16 1300  LIPASE 59*    Recent Labs Lab 03/06/16 1732  AMMONIA 129*   Coagulation Profile:  Recent Labs Lab 03/06/16 1300 03/08/16 0552  INR 2.18 2.18   Cardiac Enzymes: No results for input(s): CKTOTAL, CKMB, CKMBINDEX, TROPONINI in the last 168 hours. BNP (last 3 results) No results for  input(s): PROBNP in the last 8760 hours. HbA1C: No results for input(s): HGBA1C in the last 72 hours. CBG: No results for input(s): GLUCAP in the last 168 hours. Lipid Profile: No results for input(s): CHOL, HDL, LDLCALC, TRIG, CHOLHDL, LDLDIRECT in the last 72 hours. Thyroid Function Tests: No results for input(s): TSH, T4TOTAL, FREET4, T3FREE, THYROIDAB in the last 72 hours. Anemia Panel: No results for input(s): VITAMINB12, FOLATE, FERRITIN, TIBC, IRON, RETICCTPCT in the last 72 hours. Sepsis Labs: No results for input(s): PROCALCITON, LATICACIDVEN in the last 168 hours.  Recent Results (from the past 240 hour(s))  MRSA PCR Screening     Status: None   Collection Time: 03/06/16  6:00 PM  Result Value Ref Range Status   MRSA by PCR NEGATIVE NEGATIVE Final    Comment:        The GeneXpert MRSA Assay (FDA approved for NASAL specimens only), is one component of a comprehensive MRSA colonization surveillance program. It is not intended to diagnose MRSA infection nor to guide or monitor treatment for MRSA infections.      Radiology Studies: Dg Chest Port 1 View  Result Date: 03/11/2016 CLINICAL DATA:  PICC line placement. EXAM: PORTABLE CHEST 1 VIEW COMPARISON:  March 09, 2006 FINDINGS: Stable cardiomegaly. The hila and mediastinum are unremarkable. A new right PICC line has been placed. It is difficult to see the distal tip but I believe it terminates in the central SVC. No focal  infiltrate or overt edema. IMPRESSION: 1. New right PICC line. The distal tip is difficult to visualize but appears to terminate centrally, likely within the SVC. Electronically Signed   By: Dorise Bullion III M.D   On: 03/11/2016 15:30    Scheduled Meds: . cefTRIAXone (ROCEPHIN)  IV  1 g Intravenous Q24H  . docusate sodium  100 mg Oral BID  . feeding supplement (PRO-STAT SUGAR FREE 64)  45 mL Oral TID  . folic acid  1 mg Oral Daily  . furosemide  80 mg Intravenous BID  . lactulose  20 g Oral TID  . magnesium oxide  400 mg Oral BID  . nadolol  40 mg Oral Daily  . pantoprazole  40 mg Oral BID AC  . rifaximin  550 mg Oral BID  . sodium chloride flush  3 mL Intravenous Q12H  . thiamine  100 mg Oral Daily   Continuous Infusions:    LOS: 5 days   Romeo Zielinski, Orpah Melter, MD [facility] Pager [phone]  If 7PM-7AM, please contact night-coverage [email or web address] Password [email or web address] 03/11/2016, 4:52 PM

## 2016-03-11 NOTE — Progress Notes (Signed)
Pt a/o.vss. Foley patent. Up with assistance. No complaints of any distress. Spouse at the bedside. Report given to [place], RN verbalized understanding of report. Pt to be transferred to room 310 via wheelchair with family and nursing staff.

## 2016-03-12 DIAGNOSIS — N170 Acute kidney failure with tubular necrosis: Secondary | ICD-10-CM

## 2016-03-12 LAB — COMPREHENSIVE METABOLIC PANEL
ALBUMIN: 2 g/dL — AB (ref 3.5–5.0)
ALK PHOS: 152 U/L — AB (ref 38–126)
ALT: 20 U/L (ref 17–63)
AST: 57 U/L — ABNORMAL HIGH (ref 15–41)
Anion gap: 4 — ABNORMAL LOW (ref 5–15)
BUN: 48 mg/dL — ABNORMAL HIGH (ref 6–20)
CALCIUM: 8.2 mg/dL — AB (ref 8.9–10.3)
CHLORIDE: 100 mmol/L — AB (ref 101–111)
CO2: 27 mmol/L (ref 22–32)
CREATININE: 1.6 mg/dL — AB (ref 0.61–1.24)
GFR calc Af Amer: 59 mL/min — ABNORMAL LOW (ref >60)
GFR calc non Af Amer: 51 mL/min — ABNORMAL LOW (ref >60)
GLUCOSE: 90 mg/dL (ref 65–99)
Potassium: 3.3 mmol/L — ABNORMAL LOW (ref 3.5–5.1)
SODIUM: 131 mmol/L — AB (ref 135–145)
Total Bilirubin: 5.1 mg/dL — ABNORMAL HIGH (ref 0.3–1.2)
Total Protein: 7 g/dL (ref 6.5–8.1)

## 2016-03-12 LAB — CBC
HCT: 22.5 % — ABNORMAL LOW (ref 39.0–52.0)
HEMOGLOBIN: 7.2 g/dL — AB (ref 13.0–17.0)
MCH: 30.5 pg (ref 26.0–34.0)
MCHC: 32 g/dL (ref 30.0–36.0)
MCV: 95.3 fL (ref 78.0–100.0)
PLATELETS: 130 10*3/uL — AB (ref 150–400)
RBC: 2.36 MIL/uL — AB (ref 4.22–5.81)
RDW: 19.9 % — ABNORMAL HIGH (ref 11.5–15.5)
WBC: 9.2 10*3/uL (ref 4.0–10.5)

## 2016-03-12 LAB — PROTIME-INR
INR: 2.25
PROTHROMBIN TIME: 25.2 s — AB (ref 11.4–15.2)

## 2016-03-12 MED ORDER — POTASSIUM CHLORIDE CRYS ER 20 MEQ PO TBCR
40.0000 meq | EXTENDED_RELEASE_TABLET | Freq: Two times a day (BID) | ORAL | Status: AC
  Administered 2016-03-12 (×2): 40 meq via ORAL
  Filled 2016-03-12 (×2): qty 2

## 2016-03-12 MED ORDER — NADOLOL 20 MG PO TABS
20.0000 mg | ORAL_TABLET | Freq: Every day | ORAL | Status: DC
  Administered 2016-03-12 – 2016-03-22 (×11): 20 mg via ORAL
  Filled 2016-03-12 (×14): qty 1

## 2016-03-12 NOTE — Plan of Care (Signed)
Problem: Food- and Nutrition-Related Knowledge Deficit (NB-1.1) Goal: Nutrition education Formal process to instruct or train a patient/client in a skill or to impart knowledge to help patients/clients voluntarily manage or modify food choices and eating behavior to maintain or improve health. Outcome: Adequate for Discharge Nutrition Education Note  RD consulted for nutrition education regarding a Heart Healthy diet with specific focus on sodium intake. Patient admitted related to decompensated liver disease and has been on a fluid restriction currently 1.2 liters daily.  Lipid Panel  No results found for: CHOL, TRIG, HDL, CHOLHDL, VLDL, LDLCALC  RD provided "Heart Healthy Nutrition Therapy" handout from the [employer]. Reviewed patient's dietary recall. Provided examples on ways to decrease sodium and fat intake in diet. Discouraged intake of processed foods and use of salt shaker. Encouraged fresh fruits and vegetables as well as whole grain sources of carbohydrates to maximize fiber intake. Teach back method used. RD also spoke with his wife by telephone while with pt.  Expect good compliance.  Body mass index is 47.75 kg/m. Pt meets criteria for obesity based on current BMI.  Current diet order is Heart Healthy, patient is consuming approximately 75% of meals at this time. Labs and medications reviewed. No further nutrition interventions warranted at this time. RD contact information provided. If additional nutrition issues arise, please re-consult RD.  Colman Cater MS,RD,CSG,LDN Office: [phone] Pager: [phone]

## 2016-03-12 NOTE — Progress Notes (Signed)
Antonio [name]  MRN: [id number]  DOB/AGE: [birth year] 43 y.o.  Primary Care Physician:TAPPER,DAVID B, MD  Admit date: 03/06/2016  Chief Complaint:  Chief Complaint  Patient presents with  . Leg Swelling    S-Pt presented on  03/06/2016 with  Chief Complaint  Patient presents with  . Leg Swelling  .    Pt main concern was " I have this catheter, I cannot go to use bathroom"   Meds . cefTRIAXone (ROCEPHIN)  IV  1 g Intravenous Q24H  . docusate sodium  100 mg Oral BID  . feeding supplement (PRO-STAT SUGAR FREE 64)  45 mL Oral TID  . folic acid  1 mg Oral Daily  . furosemide  80 mg Intravenous BID  . lactulose  20 g Oral TID  . magnesium oxide  400 mg Oral BID  . nadolol  40 mg Oral Daily  . pantoprazole  40 mg Oral BID AC  . potassium chloride  40 mEq Oral BID  . rifaximin  550 mg Oral BID  . sodium chloride flush  3 mL Intravenous Q12H  . thiamine  100 mg Oral Daily      Physical Exam: Vital signs in last 24 hours: Temp:  [96.9 F (36.1 C)-98.1 F (36.7 C)] 98.1 F (36.7 C) (10/16 0539) Pulse Rate:  [37-79] 71 (10/16 0539) Resp:  [16-18] 18 (10/16 0539) BP: (100-112)/(48-62) 107/62 (10/16 0539) SpO2:  [99 %-100 %] 100 % (10/16 0539) Weight change:  Last BM Date: 03/11/16  Intake/Output from previous day: 10/15 0701 - 10/16 0700 In: [id number] [P.O.:1680; IV Piggyback:50] Out: 1100 [Urine:1100] No intake/output data recorded.   Physical Exam: General- pt is awake,alert, follows commands Resp- No acute REsp distress,decreased bs at bases + CVS- S1S2 regular in rate and rhythm GIT- BS+, soft, distended, morbidly obese EXT- 1+ LE Edema, No Cyanosis   Lab Results: CBC  Recent Labs  03/11/16 0448 03/12/16 0604  WBC 9.5 9.2  HGB 7.5* 7.2*  HCT 23.0* 22.5*  PLT 141* 130*    BMET  Recent Labs  03/11/16 0448 03/12/16 0604  NA 132* 131*  K 3.1* 3.3*  CL 102 100*  CO2 24 27  GLUCOSE 100* 90  BUN 51* 48*  CREATININE 1.69* 1.60*  CALCIUM 8.3* 8.2*    Creat trend 2017   3.26==>3.58==>2.52=>2.2=>2.0==>1.69=>1.60 2017  1.2--2.0( august admission)     MICRO Recent Results (from the past 240 hour(s))  MRSA PCR Screening     Status: None   Collection Time: 03/06/16  6:00 PM  Result Value Ref Range Status   MRSA by PCR NEGATIVE NEGATIVE Final    Comment:        The GeneXpert MRSA Assay (FDA approved for NASAL specimens only), is one component of a comprehensive MRSA colonization surveillance program. It is not intended to diagnose MRSA infection nor to guide or monitor treatment for MRSA infections.       Lab Results  Component Value Date   CALCIUM 8.2 (L) 03/12/2016   PHOS 4.4 01/17/2016         Impression: 1)Renal  AKI secondary to Prerenal/ATN                AKI possibly Hepatorenal                Data against hepatorenal                Urine Na more than 10  Not Oliguric                   AKI improving  2)HTN  BP stable  Medication- On Diuretics-  3)Anemia HGb  Low. 8.2=>7.4 Sec to GI bleeding received PRBC GI and  Primary team  following  4)Anasarca- on Diuretics  5)Thrombocytopenia Sec to Liver related issues Primary MD follwoing following  6)Electrolytes  Hypokalemic      Now better  Hyponatremic     Hypervolemia hyponatremia     improving  7)Acid base Co2 at goal     Plan:  Will continue current care Will follow bmet        [place] S 03/12/2016, 9:34 AM

## 2016-03-12 NOTE — Progress Notes (Signed)
PROGRESS NOTE    Antonio [name]  [id number] DOB: 07-Aug-1970 DOA: 03/06/2016 PCP: Deloria Lair, MD    Brief Narrative:  45 y.o. male with medical history significant of ESLD and CKD with morbid obesity.  He presents today for worsening LE edema and anuria. In the outpatient setting, patient was given an increased dose of diuretic with little to no improvement. Patient was found to have worsening renal function on presentation and was admitted for management of volume overload.  Assessment & Plan:   Principal Problem:   Decompensation of cirrhosis of liver (HCC) Active Problems:   Anemia   Anasarca   Thrombocytopenia (HCC)   Acute renal failure (HCC)   Lower extremity edema   Hyponatremia   Hyperglycemia   Protein-calorie malnutrition, severe (HCC)   CHF (congestive heart failure) (HCC)   Rectal bleeding   Hypokalemia   Elevated brain natriuretic peptide (BNP) level   Decompensated cirrhosis of liver -Liver function profile somewhat improved since time of admission -Appreciate input by GI. Recommendations for continuing Adderall 40 mg daily with addition of 20 mg in the evening with goal heart rate of 55 -GI recommendations for total of 7 days of Rocephin -GI recommendations to transfuse as needed -Continue diuresis for now  ARF -Renal function continuing to improve with Lasix -Patient remains edematous -We will ensure fluid restricted diet while being diuresis -Continue IV Lasix as tolerated -Patient with PICC team placed on 03/11/2016  Hyponatremia -Remains hyponatremic -Suspect secondary to volume overload. Continue diuresis  Acutely decompensated chronic CHF -Nephrology following. Continue IV Lasix -Patient remains with lower extremity edema that appears largely unchanged -Discussed case with nephrology. Recommendations to continue to diuresis tolerated  Suspected acute blood loss Anemia from suspected GI bleeding -Thus far during this admission,  patient has received 1 unit PRBCs on 03/07/2016 and 1 unit on 03/09/2016. -Today, hemoglobin stable at 7.2 -Chart reviewed. Gastroneurology recommends continuing to follow CBC and transfuse as needed. No plans for endoscopy at this time  Thrombocytopenia -Platelets remain stable -Secondary to alcoholic cirrhosis  Hyperglycemia -Blood work this morning reveals a blood glucose of 90  Malnutrition -Per nutrition recommendations -Ensure fluid restricted diet  Hypokalemia -Level noted be 3.3 today -We'll replace  DVT prophylaxis: SCDs Code Status: Full code Family Communication: Patient in room, family not at bedside Disposition Plan: Uncertain at this time  Consultants:   Gastroenterology  Nephrology  Procedures:     Antimicrobials: Anti-infectives    Start     Dose/Rate Route Frequency Ordered Stop   03/07/16 1400  cefTRIAXone (ROCEPHIN) 1 g in dextrose 5 % 50 mL IVPB    Comments:  Please dose adjust if needed for renal insufficency   1 g 100 mL/hr over 30 Minutes Intravenous Every 24 hours 03/07/16 1339 03/14/16 1359   03/06/16 1800  rifaximin (XIFAXAN) tablet 550 mg     550 mg Oral 2 times daily 03/06/16 1734        Subjective: Concern about difficulty wiping after moving his bowels  Objective: Vitals:   03/11/16 1516 03/11/16 2145 03/12/16 0539 03/12/16 1420  BP: (!) 112/54 (!) 110/55 107/62 (!) 109/51  Pulse: 65 79 71 69  Resp: 18 18 18 20   Temp: 97.7 F (36.5 C) 97.7 F (36.5 C) 98.1 F (36.7 C) 97.1 F (36.2 C)  TempSrc: Oral Oral Oral Oral  SpO2: 100% 100% 100% 100%  Weight:      Height: 6\' 2"  (1.88 m)       Intake/Output Summary (Last  24 hours) at 03/12/16 1730 Last data filed at 03/12/16 1421  Gross per 24 hour  Intake             1560 ml  Output             1300 ml  Net              260 ml   Filed Weights   03/08/16 0500 03/09/16 0509 03/10/16 0400  Weight: (!) 165.6 kg (365 lb 1.3 oz) (!) 166.1 kg (366 lb 2.9 oz) (!) 168.7 kg (371  lb 14.7 oz)    Examination:  General exam: Lying bed, no acute distress Respiratory system: Normal respiratory effort, no wheezing Cardiovascular system: Regular rhythm, S1-S2 Gastrointestinal system: Obese, nontender, subcutaneous edema Central nervous system: CN II through XII grossly intact, sensation intact Extremities: No clubbing, no joint deformities Skin: Normal skin turgor, no pallor, appears jaundiced Psychiatry: Normal mood, no auditory hallucinations  Data Reviewed: I have personally reviewed following labs and imaging studies  CBC:  Recent Labs Lab 03/06/16 1300  03/08/16 0552 03/09/16 0445  03/09/16 1853 03/10/16 0454 03/10/16 1612 03/11/16 0448 03/12/16 0604  WBC 10.2  < > 11.1* 11.1*  --   --  11.3*  --  9.5 9.2  NEUTROABS 6.1  --   --   --   --   --   --   --   --   --   HGB 7.4*  < > 7.6* 7.0*  < > 8.2* 7.4* 7.5* 7.5* 7.2*  HCT 23.0*  < > 22.9* 22.0*  < > 25.3* 22.7* 23.0* 23.0* 22.5*  MCV 92.0  < > 90.9 94.8  --   --  93.8  --  93.5 95.3  PLT 149*  < > 144* 139*  --   --  138*  --  141* 130*  < > = values in this interval not displayed. Basic Metabolic Panel:  Recent Labs Lab 03/08/16 0552 03/09/16 0445 03/10/16 0454 03/11/16 0448 03/12/16 0604  NA 130* 131* 132* 132* 131*  K 3.5 2.9* 3.6 3.1* 3.3*  CL 99* 99* 102 102 100*  CO2 23 23 25 24 27   GLUCOSE 91 119* 109* 100* 90  BUN 55* 53* 54* 51* 48*  CREATININE 2.52* 2.21* 2.00* 1.69* 1.60*  CALCIUM 8.7* 8.3* 8.4* 8.3* 8.2*  MG  --  1.8  --   --   --    GFR: Estimated Creatinine Clearance: 98.3 mL/min (by C-G formula based on SCr of 1.6 mg/dL (H)). Liver Function Tests:  Recent Labs Lab 03/08/16 0552 03/09/16 0445 03/10/16 0454 03/11/16 0448 03/12/16 0604  AST 56* 54* 64* 60* 57*  ALT 21 20 22 20 20   ALKPHOS 141* 168* 204* 197* 152*  BILITOT 7.0* 5.7* 4.4* 4.5* 5.1*  PROT 7.2 6.8 6.9 7.0 7.0  ALBUMIN 2.2* 2.0* 2.1* 2.1* 2.0*    Recent Labs Lab 03/06/16 1300  LIPASE 59*     Recent Labs Lab 03/06/16 1732  AMMONIA 129*   Coagulation Profile:  Recent Labs Lab 03/06/16 1300 03/08/16 0552 03/12/16 1642  INR 2.18 2.18 2.25   Cardiac Enzymes: No results for input(s): CKTOTAL, CKMB, CKMBINDEX, TROPONINI in the last 168 hours. BNP (last 3 results) No results for input(s): PROBNP in the last 8760 hours. HbA1C: No results for input(s): HGBA1C in the last 72 hours. CBG: No results for input(s): GLUCAP in the last 168 hours. Lipid Profile: No results for input(s): CHOL, HDL, LDLCALC, TRIG,  CHOLHDL, LDLDIRECT in the last 72 hours. Thyroid Function Tests: No results for input(s): TSH, T4TOTAL, FREET4, T3FREE, THYROIDAB in the last 72 hours. Anemia Panel: No results for input(s): VITAMINB12, FOLATE, FERRITIN, TIBC, IRON, RETICCTPCT in the last 72 hours. Sepsis Labs: No results for input(s): PROCALCITON, LATICACIDVEN in the last 168 hours.  Recent Results (from the past 240 hour(s))  MRSA PCR Screening     Status: None   Collection Time: 03/06/16  6:00 PM  Result Value Ref Range Status   MRSA by PCR NEGATIVE NEGATIVE Final    Comment:        The GeneXpert MRSA Assay (FDA approved for NASAL specimens only), is one component of a comprehensive MRSA colonization surveillance program. It is not intended to diagnose MRSA infection nor to guide or monitor treatment for MRSA infections.      Radiology Studies: Dg Chest Port 1 View  Result Date: 03/11/2016 CLINICAL DATA:  PICC line placement. EXAM: PORTABLE CHEST 1 VIEW COMPARISON:  March 09, 2006 FINDINGS: Stable cardiomegaly. The hila and mediastinum are unremarkable. A new right PICC line has been placed. It is difficult to see the distal tip but I believe it terminates in the central SVC. No focal infiltrate or overt edema. IMPRESSION: 1. New right PICC line. The distal tip is difficult to visualize but appears to terminate centrally, likely within the SVC. Electronically Signed   By: Dorise Bullion III M.D   On: 03/11/2016 15:30    Scheduled Meds: . cefTRIAXone (ROCEPHIN)  IV  1 g Intravenous Q24H  . docusate sodium  100 mg Oral BID  . feeding supplement (PRO-STAT SUGAR FREE 64)  45 mL Oral TID  . folic acid  1 mg Oral Daily  . furosemide  80 mg Intravenous BID  . lactulose  20 g Oral TID  . magnesium oxide  400 mg Oral BID  . nadolol  20 mg Oral QPC supper  . nadolol  40 mg Oral Daily  . pantoprazole  40 mg Oral BID AC  . potassium chloride  40 mEq Oral BID  . rifaximin  550 mg Oral BID  . sodium chloride flush  3 mL Intravenous Q12H  . thiamine  100 mg Oral Daily   Continuous Infusions:    LOS: 6 days   Tuesday Terlecki, Orpah Melter, MD [facility] Pager [phone]  If 7PM-7AM, please contact night-coverage [email or web address] Password [email or web address] 03/12/2016, 5:30 PM

## 2016-03-12 NOTE — Progress Notes (Signed)
Subjective Foley catheter is getting on my nerves. No GI bleeding. Feels like lower extremity edema is improved. No abdominal pain. Tolerating diet. No rectal bleeding.   Objective: Vital signs in last 24 hours: Temp:  [96.9 F (36.1 C)-98.1 F (36.7 C)] 98.1 F (36.7 C) (10/16 0539) Pulse Rate:  [37-79] 71 (10/16 0539) Resp:  [16-18] 18 (10/16 0539) BP: (100-112)/(48-62) 107/62 (10/16 0539) SpO2:  [99 %-100 %] 100 % (10/16 0539) Last BM Date: 03/11/16 General:   Alert and oriented, pleasant Eyes:  Mild scleral icterus  Mouth:  Without lesions, mucosa pink and moist.  Abdomen:  Bowel sounds present, obese, anasarca noted with pitting edema in flanks, no rebound or guarding Extremities:  Bilateral anasarca lower extremities, 3+pitting edema lower extremities  Neurologic:  Alert and  oriented x4 Psych:  Alert and cooperative.   Intake/Output from previous day: 10/15 0701 - 10/16 0700 In: 1730 [P.O.:1680; IV Piggyback:50] Out: 1100 [Urine:1100] Intake/Output this shift: No intake/output data recorded.  Lab Results:  Recent Labs  03/10/16 0454 03/10/16 1612 03/11/16 0448 03/12/16 0604  WBC 11.3*  --  9.5 9.2  HGB 7.4* 7.5* 7.5* 7.2*  HCT 22.7* 23.0* 23.0* 22.5*  PLT 138*  --  141* 130*   BMET  Recent Labs  03/10/16 0454 03/11/16 0448 03/12/16 0604  NA 132* 132* 131*  K 3.6 3.1* 3.3*  CL 102 102 100*  CO2 25 24 27   GLUCOSE 109* 100* 90  BUN 54* 51* 48*  CREATININE 2.00* 1.69* 1.60*  CALCIUM 8.4* 8.3* 8.2*   LFT  Recent Labs  03/10/16 0454 03/11/16 0448 03/12/16 0604  PROT 6.9 7.0 7.0  ALBUMIN 2.1* 2.1* 2.0*  AST 64* 60* 57*  ALT 22 20 20   ALKPHOS 204* 197* 152*  BILITOT 4.4* 4.5* 5.1*   Lab Results  Component Value Date   INR 2.18 03/08/2016   INR 2.18 03/06/2016         Studies/Results: Dg Chest Port 1 View  Result Date: 03/11/2016 CLINICAL DATA:  PICC line placement. EXAM: PORTABLE CHEST 1 VIEW COMPARISON:  March 09, 2006  FINDINGS: Stable cardiomegaly. The hila and mediastinum are unremarkable. A new right PICC line has been placed. It is difficult to see the distal tip but I believe it terminates in the central SVC. No focal infiltrate or overt edema. IMPRESSION: 1. New right PICC line. The distal tip is difficult to visualize but appears to terminate centrally, likely within the SVC. 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 Fresh blood post-colonoscopy not unexpected in setting of coagulopathy and self-limiting. Continues with Hgb in the 7 range but without any further overt GI bleeding. Will recheck INR now.   Anasarca: Patient feels this has improved. Discussed with nephrology. Continues to diurese well and Cr is improving. Hold on adding aldactone with lasix until Cr in the 1.2 or 1 range. Albumin runs on hold as well and nephrology feels likely not beneficial in this scenario.   Plan: Check INR now Transfuse prn Continue Nadolol 40 mg daily but add 20 mg Nadolol in evening as heart rate has continued to be in 70s.  Goal of 55.  Continue diuresis per nephrology Follow H/H Rocephin for total of 7 days, last dose 10/17  Annitta Needs, ANP-BC [facility]     LOS: 6 days    03/12/2016, 9:15 AM

## 2016-03-13 ENCOUNTER — Encounter (HOSPITAL_COMMUNITY): Payer: Self-pay | Admitting: Internal Medicine

## 2016-03-13 LAB — COMPREHENSIVE METABOLIC PANEL
ALBUMIN: 2 g/dL — AB (ref 3.5–5.0)
ALK PHOS: 151 U/L — AB (ref 38–126)
ALT: 19 U/L (ref 17–63)
AST: 57 U/L — AB (ref 15–41)
Anion gap: 5 (ref 5–15)
BILIRUBIN TOTAL: 4.3 mg/dL — AB (ref 0.3–1.2)
BUN: 46 mg/dL — AB (ref 6–20)
CALCIUM: 8.3 mg/dL — AB (ref 8.9–10.3)
CO2: 25 mmol/L (ref 22–32)
CREATININE: 1.44 mg/dL — AB (ref 0.61–1.24)
Chloride: 102 mmol/L (ref 101–111)
GFR calc Af Amer: >60 mL/min (ref >60)
GFR calc non Af Amer: 58 mL/min — ABNORMAL LOW (ref >60)
GLUCOSE: 88 mg/dL (ref 65–99)
Potassium: 3.1 mmol/L — ABNORMAL LOW (ref 3.5–5.1)
SODIUM: 132 mmol/L — AB (ref 135–145)
TOTAL PROTEIN: 6.8 g/dL (ref 6.5–8.1)

## 2016-03-13 LAB — CBC
HCT: 21.5 % — ABNORMAL LOW (ref 39.0–52.0)
HEMOGLOBIN: 7.1 g/dL — AB (ref 13.0–17.0)
MCH: 31.4 pg (ref 26.0–34.0)
MCHC: 33 g/dL (ref 30.0–36.0)
MCV: 95.1 fL (ref 78.0–100.0)
PLATELETS: 135 10*3/uL — AB (ref 150–400)
RBC: 2.26 MIL/uL — ABNORMAL LOW (ref 4.22–5.81)
RDW: 19.5 % — AB (ref 11.5–15.5)
WBC: 9.7 10*3/uL (ref 4.0–10.5)

## 2016-03-13 LAB — MAGNESIUM: MAGNESIUM: 2 mg/dL (ref 1.7–2.4)

## 2016-03-13 MED ORDER — CAMPHOR-MENTHOL 0.5-0.5 % EX LOTN
TOPICAL_LOTION | CUTANEOUS | Status: DC | PRN
  Administered 2016-03-14 – 2016-03-23 (×2): 1 via TOPICAL
  Filled 2016-03-13: qty 222

## 2016-03-13 MED ORDER — DEXTROSE 5 % IV SOLN
10.0000 mg | Freq: Once | INTRAVENOUS | Status: AC
  Administered 2016-03-13: 10 mg via INTRAVENOUS
  Filled 2016-03-13: qty 1

## 2016-03-13 MED ORDER — POTASSIUM CHLORIDE CRYS ER 20 MEQ PO TBCR: 40.0000 meq | EXTENDED_RELEASE_TABLET | Freq: Two times a day (BID) | ORAL | Status: DC

## 2016-03-13 MED ORDER — TORSEMIDE 20 MG PO TABS
60.0000 mg | ORAL_TABLET | Freq: Every day | ORAL | Status: DC
  Administered 2016-03-13 – 2016-03-14 (×2): 60 mg via ORAL
  Filled 2016-03-13 (×3): qty 3

## 2016-03-13 MED ORDER — HYDROXYZINE HCL 25 MG PO TABS
25.0000 mg | ORAL_TABLET | Freq: Once | ORAL | Status: AC
  Administered 2016-03-13: 25 mg via ORAL
  Filled 2016-03-13: qty 1

## 2016-03-13 MED ORDER — POTASSIUM CHLORIDE CRYS ER 20 MEQ PO TBCR
40.0000 meq | EXTENDED_RELEASE_TABLET | Freq: Three times a day (TID) | ORAL | Status: DC
  Administered 2016-03-13 – 2016-03-15 (×9): 40 meq via ORAL
  Filled 2016-03-13 (×9): qty 2

## 2016-03-13 NOTE — Progress Notes (Signed)
Subjective: Interval History: has no complaint of difficulty breathing. However patient states that he is still neck swelling. Patient denies any nausea or vomiting..  Objective: Vital signs in last 24 hours: Temp:  [97.1 F (36.2 C)-98.4 F (36.9 C)] 98.4 F (36.9 C) (10/17 0551) Pulse Rate:  [69-76] 70 (10/17 0551) Resp:  [18-20] 18 (10/17 0551) BP: (109-118)/(51-61) 112/57 (10/17 0551) SpO2:  [100 %] 100 % (10/17 0551) Weight:  [168.4 kg (371 lb 4.1 oz)] 168.4 kg (371 lb 4.1 oz) (10/17 0551) Weight change:   Intake/Output from previous day: 10/16 0701 - 10/17 0700 In: 1060 [P.O.:960; IV Piggyback:100] Out: 3800 [Urine:3800] Intake/Output this shift: No intake/output data recorded.  General appearance: alert, cooperative and no distress Resp: diminished breath sounds bilaterally GI: Distended, nontender and difficult to palpate organomegaly Extremities: edema He has 2-3+ edema  Lab Results:  Recent Labs  03/12/16 0604 03/13/16 0512  WBC 9.2 9.7  HGB 7.2* 7.1*  HCT 22.5* 21.5*  PLT 130* 135*   BMET:  Recent Labs  03/12/16 0604 03/13/16 0512  NA 131* 132*  K 3.3* 3.1*  CL 100* 102  CO2 27 25  GLUCOSE 90 88  BUN 48* 46*  CREATININE 1.60* 1.44*  CALCIUM 8.2* 8.3*   No results for input(s): PTH in the last 72 hours. Iron Studies: No results for input(s): IRON, TIBC, TRANSFERRIN, FERRITIN in the last 72 hours.  Studies/Results: Dg Chest Port 1 View  Result Date: 03/11/2016 CLINICAL DATA:  PICC line placement. EXAM: PORTABLE CHEST 1 VIEW COMPARISON:  March 09, 2006 FINDINGS: Stable cardiomegaly. The hila and mediastinum are unremarkable. A new right PICC line has been placed. It is difficult to see the distal tip but I believe it terminates in the central SVC. No focal infiltrate or overt edema. IMPRESSION: 1. New right PICC line. The distal tip is difficult to visualize but appears to terminate centrally, likely within the SVC. Electronically Signed   By:  Dorise Bullion III M.D   On: 03/11/2016 15:30    I have reviewed the patient's current medications.  Assessment/Plan: Problem #1 acute kidney injury superimposed on chronic. Presently his renal function seems to be improving. Problem #2 anasarca: Presently he is on Lasix. Patient has 3800 mL of urine output. However patient still with significant sign of fluid overload. Problem #3 hypokalemia: Most likely from diuretics Problem #4 height are probably make hyponatremia: His sodium is 132 improving Problem #5 anemia: Most likely from GI bleeding. Problem #6 obesity Problem #7 history of liver cirrhosis  Plan: 1] We'll DC Lasix 2] We'll start on Demadex 60 mg by mouth once a day 3] will DC Foley catheter 4] will change KCl to 40 mEq by mouth 3 times a day 5] will check renal panel in the morning   LOS: 7 days   Tana Trefry S 03/13/2016,8:49 AM

## 2016-03-13 NOTE — Progress Notes (Signed)
Subjective: Notes low-volume rectal bleeding with each bowel movement. No abdominal pain. Feels like lower extremity edema is better today. " I can wiggle my toes". Wants to get catheter out. Tolerating diet.   Objective: Vital signs in last 24 hours: Temp:  [97.1 F (36.2 C)-98.4 F (36.9 C)] 98.4 F (36.9 C) (10/17 0551) Pulse Rate:  [69-76] 70 (10/17 0551) Resp:  [18-20] 18 (10/17 0551) BP: (109-118)/(51-61) 112/57 (10/17 0551) SpO2:  [100 %] 100 % (10/17 0551) Weight:  [371 lb 4.1 oz (168.4 kg)] 371 lb 4.1 oz (168.4 kg) (10/17 0551) Last BM Date: 03/12/16 General:   Alert and oriented, pleasant Abdomen:  Bowel sounds present, soft, obese, pitting edema in flanks improved from prior exam Extremities:  2-3+ pitting lower extremity edema, ankle/pedal edema with improvement from prior exam although still significant  Neurologic:  Alert and  oriented x4 Psych:  Alert and cooperative. Normal mood and affect.  Intake/Output from previous day: 10/16 0701 - 10/17 0700 In: 1060 [P.O.:960; IV Piggyback:100] Out: 3800 [Urine:3800] Intake/Output this shift: No intake/output data recorded.  Lab Results:  Recent Labs  03/11/16 0448 03/12/16 0604 03/13/16 0512  WBC 9.5 9.2 9.7  HGB 7.5* 7.2* 7.1*  HCT 23.0* 22.5* 21.5*  PLT 141* 130* 135*   BMET  Recent Labs  03/11/16 0448 03/12/16 0604 03/13/16 0512  NA 132* 131* 132*  K 3.1* 3.3* 3.1*  CL 102 100* 102  CO2 24 27 25   GLUCOSE 100* 90 88  BUN 51* 48* 46*  CREATININE 1.69* 1.60* 1.44*  CALCIUM 8.3* 8.2* 8.3*   LFT  Recent Labs  03/11/16 0448 03/12/16 0604 03/13/16 0512  PROT 7.0 7.0 6.8  ALBUMIN 2.1* 2.0* 2.0*  AST 60* 57* 57*  ALT 20 20 19   ALKPHOS 197* 152* 151*  BILITOT 4.5* 5.1* 4.3*   PT/INR  Recent Labs  03/12/16 1642  LABPROT 25.2*  INR 2.25     Studies/Results: Dg Chest Port 1 View  Result Date: 03/11/2016 CLINICAL DATA:  PICC line placement. EXAM: PORTABLE CHEST 1 VIEW COMPARISON:   March 09, 2006 FINDINGS: Stable cardiomegaly. The hila and mediastinum are unremarkable. A new right PICC line has been placed. It is difficult to see the distal tip but I believe it terminates in the central SVC. No focal infiltrate or overt edema. IMPRESSION: 1. New right PICC line. The distal tip is difficult to visualize but appears to terminate centrally, likely within the SVC. 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 MELD-Na 29 on 03/12/16.  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 Immediately after colonoscopy had large amount of fresh blood, which tapered off, self-limiting. Continues with Hgb in the 7 range, continues to drift, with low-volume hematochezia noted now with each bowel movement. In setting of coagulopathy and INR 2.25, bleeding likely secondary to coagulopathy. Will provide one dose of Vit K IV (instead of oral for better absorption). Transfuse as needed. Follow INR.   Anasarca: Patient feels this has improved.  Continues to diurese well and Cr is improving. Diuretic management per nephrology and lasix has been discontinued, Demadex added.   Plan: Dose of Vit K now Continue Nadolol  40 mg in morning and 20 mg in evening: goal of HR 55 (20 mg added on 10/16 as heart rate not at goal. Will need to monitor) Continue diuresis per nephrology Follow H/H, repeat in am Recheck INR in am Last dose of Rocephin today  Annitta Needs, ANP-BC [facility]    LOS: 7 days    03/13/2016, 7:55 AM

## 2016-03-13 NOTE — Care Management Note (Signed)
Case Management Note  Patient Details  Name: Antonio [name] MRN: [id number] Date of Birth: 1971/02/17  If discussed at [place] Length of Stay Meetings, dates discussed:  03/13/2016    Sherald Barge, RN 03/13/2016, 2:13 PM

## 2016-03-13 NOTE — Progress Notes (Signed)
PROGRESS NOTE    Antonio [name]  [id number] DOB: 29-Oct-1970 DOA: 03/06/2016 PCP: Deloria Lair, MD    Brief Narrative:  45 y.o. male with medical history significant of ESLD and CKD with morbid obesity.  He presents today for worsening LE edema and anuria. In the outpatient setting, patient was given an increased dose of diuretic with little to no improvement. Patient was found to have worsening renal function on presentation and was admitted for management of volume overload.  Assessment & Plan:   Principal Problem:   Decompensation of cirrhosis of liver (HCC) Active Problems:   Anemia   Anasarca   Thrombocytopenia (HCC)   Acute renal failure (HCC)   Lower extremity edema   Hyponatremia   Hyperglycemia   Protein-calorie malnutrition, severe (HCC)   CHF (congestive heart failure) (HCC)   Rectal bleeding   Hypokalemia   Elevated brain natriuretic peptide (BNP) level   ATN (acute tubular necrosis) (HCC)   Decompensated cirrhosis of liver -Function profile is improving -GI following. GI recommendations to continue metal wall 40 mg by mouth daily and 20 mg by mouth in evening. Goal heart rate 55 -Recommendations for vitamin K -Repeat CBC in the morning -Plan to complete Rocephin after today's dose  ARF -Renal function continuing to improve with Lasix -Patient still edematous, albeit improved -Discussed case with nephrology. Plans to discontinue Foley catheter today, transition IV Lasix to by mouth Demadex -Patient with PICC team placed on 03/11/2016  Hyponatremia -Sodium stable at 132 -Suspect hyponatremia secondary to volume overload  Acutely decompensated chronic CHF -Nephrology following. -Patient on Demadex per nephrology recommendations -Appears to be improving, with improved lower extremity edema  Suspected acute blood loss Anemia from suspected GI bleeding -Thus far during this admission, patient has received 1 unit PRBCs on 03/07/2016 and 1 unit on  03/09/2016. -Hemoglobin stable at 7.1 (7.2 yesterday) -Continue to follow CBC, transfuse as needed -Vitamin K per GI recommendations  Thrombocytopenia -Platelets stable -Repeat CBC in the morning  Hyperglycemia -Morning glucose noted to be 88 -Continue to monitor  Malnutrition -Appreciate input by nutrition -For now, continue fluid restricted diet  Hypokalemia -Potassium 3.1 today, lower than yesterday -Have check magnesium today, normal at 2.0 -Discussed with nephrology, plans to increase potassium to 3 times a day dosing  DVT prophylaxis: SCDs Code Status: Full code Family Communication: Patient in room, family at bedside Disposition Plan: Uncertain at this time  Consultants:   Gastroenterology  Nephrology  Procedures:     Antimicrobials: Anti-infectives    Start     Dose/Rate Route Frequency Ordered Stop   03/07/16 1400  cefTRIAXone (ROCEPHIN) 1 g in dextrose 5 % 50 mL IVPB    Comments:  Please dose adjust if needed for renal insufficency   1 g 100 mL/hr over 30 Minutes Intravenous Every 24 hours 03/07/16 1339 03/13/16 1441   03/06/16 1800  rifaximin (XIFAXAN) tablet 550 mg     550 mg Oral 2 times daily 03/06/16 1734        Subjective: Reports feeling better today. Swelling seems improved per patient  Objective: Vitals:   03/12/16 2119 03/13/16 0551 03/13/16 1020 03/13/16 1323  BP: 118/61 (!) 112/57  (!) 118/41  Pulse: 76 70  73  Resp: 20 18  20   Temp: 98.4 F (36.9 C) 98.4 F (36.9 C)  98.8 F (37.1 C)  TempSrc: Oral Oral  Oral  SpO2: 100% 100%  100%  Weight:  (!) 168.4 kg (371 lb 4.1 oz) (!) 170.9 kg (376  lb 12.8 oz)   Height:        Intake/Output Summary (Last 24 hours) at 03/13/16 1701 Last data filed at 03/13/16 1529  Gross per 24 hour  Intake             1190 ml  Output             4750 ml  Net            -3560 ml   Filed Weights   03/10/16 0400 03/13/16 0551 03/13/16 1020  Weight: (!) 168.7 kg (371 lb 14.7 oz) (!) 168.4 kg (371  lb 4.1 oz) (!) 170.9 kg (376 lb 12.8 oz)    Examination:  General exam: Awake, lying in bed, no acute distress Respiratory system: Speaking full sentences, normal chest rise, no audible wheezing Cardiovascular system: Regular rate, S1-S2 Gastrointestinal system: Nondistended, positive bowel sounds Central nervous system: No seizures, no tremors Extremities: No cyanosis, perfused Skin: No notable skin lesions seen, no rashes Psychiatry: Normal affect, no visual hallucinations  Data Reviewed: I have personally reviewed following labs and imaging studies  CBC:  Recent Labs Lab 03/09/16 0445  03/10/16 0454 03/10/16 1612 03/11/16 0448 03/12/16 0604 03/13/16 0512  WBC 11.1*  --  11.3*  --  9.5 9.2 9.7  HGB 7.0*  < > 7.4* 7.5* 7.5* 7.2* 7.1*  HCT 22.0*  < > 22.7* 23.0* 23.0* 22.5* 21.5*  MCV 94.8  --  93.8  --  93.5 95.3 95.1  PLT 139*  --  138*  --  141* 130* 135*  < > = values in this interval not displayed. Basic Metabolic Panel:  Recent Labs Lab 03/09/16 0445 03/10/16 0454 03/11/16 0448 03/12/16 0604 03/13/16 0512  NA 131* 132* 132* 131* 132*  K 2.9* 3.6 3.1* 3.3* 3.1*  CL 99* 102 102 100* 102  CO2 23 25 24 27 25   GLUCOSE 119* 109* 100* 90 88  BUN 53* 54* 51* 48* 46*  CREATININE 2.21* 2.00* 1.69* 1.60* 1.44*  CALCIUM 8.3* 8.4* 8.3* 8.2* 8.3*  MG 1.8  --   --   --  2.0   GFR: Estimated Creatinine Clearance: 110.1 mL/min (by C-G formula based on SCr of 1.44 mg/dL (H)). Liver Function Tests:  Recent Labs Lab 03/09/16 0445 03/10/16 0454 03/11/16 0448 03/12/16 0604 03/13/16 0512  AST 54* 64* 60* 57* 57*  ALT 20 22 20 20 19   ALKPHOS 168* 204* 197* 152* 151*  BILITOT 5.7* 4.4* 4.5* 5.1* 4.3*  PROT 6.8 6.9 7.0 7.0 6.8  ALBUMIN 2.0* 2.1* 2.1* 2.0* 2.0*   No results for input(s): LIPASE, AMYLASE in the last 168 hours.  Recent Labs Lab 03/06/16 1732  AMMONIA 129*   Coagulation Profile:  Recent Labs Lab 03/08/16 0552 03/12/16 1642  INR 2.18 2.25    Cardiac Enzymes: No results for input(s): CKTOTAL, CKMB, CKMBINDEX, TROPONINI in the last 168 hours. BNP (last 3 results) No results for input(s): PROBNP in the last 8760 hours. HbA1C: No results for input(s): HGBA1C in the last 72 hours. CBG: No results for input(s): GLUCAP in the last 168 hours. Lipid Profile: No results for input(s): CHOL, HDL, LDLCALC, TRIG, CHOLHDL, LDLDIRECT in the last 72 hours. Thyroid Function Tests: No results for input(s): TSH, T4TOTAL, FREET4, T3FREE, THYROIDAB in the last 72 hours. Anemia Panel: No results for input(s): VITAMINB12, FOLATE, FERRITIN, TIBC, IRON, RETICCTPCT in the last 72 hours. Sepsis Labs: No results for input(s): PROCALCITON, LATICACIDVEN in the last 168 hours.  Recent Results (  from the past 240 hour(s))  MRSA PCR Screening     Status: None   Collection Time: 03/06/16  6:00 PM  Result Value Ref Range Status   MRSA by PCR NEGATIVE NEGATIVE Final    Comment:        The GeneXpert MRSA Assay (FDA approved for NASAL specimens only), is one component of a comprehensive MRSA colonization surveillance program. It is not intended to diagnose MRSA infection nor to guide or monitor treatment for MRSA infections.      Radiology Studies: No results found.  Scheduled Meds: . docusate sodium  100 mg Oral BID  . feeding supplement (PRO-STAT SUGAR FREE 64)  45 mL Oral TID  . folic acid  1 mg Oral Daily  . lactulose  20 g Oral TID  . magnesium oxide  400 mg Oral BID  . nadolol  20 mg Oral QPC supper  . nadolol  40 mg Oral Daily  . pantoprazole  40 mg Oral BID AC  . potassium chloride  40 mEq Oral TID  . rifaximin  550 mg Oral BID  . sodium chloride flush  3 mL Intravenous Q12H  . thiamine  100 mg Oral Daily  . torsemide  60 mg Oral Daily   Continuous Infusions:    LOS: 7 days   CHIU, Orpah Melter, MD [facility] Pager [phone]  If 7PM-7AM, please contact night-coverage [email or web address] Password [email or web address] 03/13/2016,  5:01 PM

## 2016-03-14 ENCOUNTER — Encounter: Payer: Self-pay | Admitting: Internal Medicine

## 2016-03-14 LAB — COMPREHENSIVE METABOLIC PANEL
ALBUMIN: 2 g/dL — AB (ref 3.5–5.0)
ALK PHOS: 146 U/L — AB (ref 38–126)
ALT: 18 U/L (ref 17–63)
AST: 59 U/L — AB (ref 15–41)
Anion gap: 6 (ref 5–15)
BILIRUBIN TOTAL: 4.2 mg/dL — AB (ref 0.3–1.2)
BUN: 46 mg/dL — AB (ref 6–20)
CALCIUM: 8.1 mg/dL — AB (ref 8.9–10.3)
CO2: 23 mmol/L (ref 22–32)
CREATININE: 1.48 mg/dL — AB (ref 0.61–1.24)
Chloride: 101 mmol/L (ref 101–111)
GFR calc Af Amer: >60 mL/min (ref >60)
GFR calc non Af Amer: 56 mL/min — ABNORMAL LOW (ref >60)
GLUCOSE: 129 mg/dL — AB (ref 65–99)
Potassium: 3.1 mmol/L — ABNORMAL LOW (ref 3.5–5.1)
SODIUM: 130 mmol/L — AB (ref 135–145)
TOTAL PROTEIN: 6.7 g/dL (ref 6.5–8.1)

## 2016-03-14 LAB — CBC
HEMATOCRIT: 21.3 % — AB (ref 39.0–52.0)
Hemoglobin: 6.6 g/dL — CL (ref 13.0–17.0)
MCH: 30 pg (ref 26.0–34.0)
MCHC: 31 g/dL (ref 30.0–36.0)
MCV: 96.8 fL (ref 78.0–100.0)
PLATELETS: 125 10*3/uL — AB (ref 150–400)
RBC: 2.2 MIL/uL — AB (ref 4.22–5.81)
RDW: 19.4 % — ABNORMAL HIGH (ref 11.5–15.5)
WBC: 8.8 10*3/uL (ref 4.0–10.5)

## 2016-03-14 LAB — PROTIME-INR
INR: 2.34
Prothrombin Time: 26 seconds — ABNORMAL HIGH (ref 11.4–15.2)

## 2016-03-14 LAB — PREPARE RBC (CROSSMATCH)

## 2016-03-14 LAB — HEMOGLOBIN AND HEMATOCRIT, BLOOD
HEMATOCRIT: 23.4 % — AB (ref 39.0–52.0)
HEMOGLOBIN: 7.7 g/dL — AB (ref 13.0–17.0)

## 2016-03-14 MED ORDER — CAMPHOR-MENTHOL 0.5-0.5 % EX LOTN
TOPICAL_LOTION | CUTANEOUS | Status: AC
  Filled 2016-03-14: qty 222

## 2016-03-14 MED ORDER — SODIUM CHLORIDE 0.9 % IV SOLN
Freq: Once | INTRAVENOUS | Status: AC
  Administered 2016-03-14: 15:00:00 via INTRAVENOUS

## 2016-03-14 NOTE — Progress Notes (Signed)
Subjective:  Overall feels like his edema has improved some. He complains of itching. Continues to have scant amount of bright red blood per rectum.   Objective: Vital signs in last 24 hours: Temp:  [97.7 F (36.5 C)-98.8 F (37.1 C)] 97.7 F (36.5 C) (10/18 0509) Pulse Rate:  [66-73] 68 (10/18 0509) Resp:  [20] 20 (10/18 0509) BP: (104-118)/(38-45) 110/38 (10/18 0509) SpO2:  [100 %] 100 % (10/18 0509) Weight:  [376 lb 12.8 oz (170.9 kg)] 376 lb 12.8 oz (170.9 kg) (10/17 1020) Last BM Date: 03/13/16 General:   Alert,  Well-developed, well-nourished, pleasant and cooperative in NAD Head:  Normocephalic and atraumatic. Eyes:  Sclera clear, no icterus.  Abdomen:  Soft, nontender. Pitting edema in the flanks. Central abd soft. Normal bowel sounds, without guarding, and without rebound.   Extremities:  Without clubbing, deformity. 2-3+ pitting edema lower extremities, lesser degree in thighs. Neurologic:  Alert and  oriented x4;  grossly normal neurologically. Skin:  Intact without significant lesions or rashes. Psych:  Alert and cooperative. Normal mood and affect.  Intake/Output from previous day: 10/17 0701 - 10/18 0700 In: 1750 [P.O.:1740; I.V.:10] Out: 2300 [Urine:2300] Intake/Output this shift: No intake/output data recorded.  Lab Results: CBC  Recent Labs  03/12/16 0604 03/13/16 0512 03/14/16 0524  WBC 9.2 9.7 8.8  HGB 7.2* 7.1* 6.6*  HCT 22.5* 21.5* 21.3*  MCV 95.3 95.1 96.8  PLT 130* 135* 125*   BMET  Recent Labs  03/12/16 0604 03/13/16 0512 03/14/16 0524  NA 131* 132* 130*  K 3.3* 3.1* 3.1*  CL 100* 102 101  CO2 27 25 23   GLUCOSE 90 88 129*  BUN 48* 46* 46*  CREATININE 1.60* 1.44* 1.48*  CALCIUM 8.2* 8.3* 8.1*   LFTs  Recent Labs  03/12/16 0604 03/13/16 0512 03/14/16 0524  BILITOT 5.1* 4.3* 4.2*  ALKPHOS 152* 151* 146*  AST 57* 57* 59*  ALT 20 19 18   PROT 7.0 6.8 6.7  ALBUMIN 2.0* 2.0* 2.0*   No results for input(s): LIPASE in the last  72 hours. PT/INR  Recent Labs  03/12/16 1642 03/14/16 0524  LABPROT 25.2* 26.0*  INR 2.25 2.34      Imaging Studies: Dg Chest 2 View  Result Date: 03/06/2016 CLINICAL DATA:  Dyspnea. EXAM: CHEST  2 VIEW COMPARISON:  01/09/2016 chest radiograph. FINDINGS: Stable cardiomediastinal silhouette with mild cardiomegaly. No pneumothorax. No pleural effusion. Mild pulmonary edema. No acute consolidative airspace disease. IMPRESSION: Mild congestive heart failure. Electronically Signed   By: Ilona Sorrel M.D.   On: 03/06/2016 13:26   US Renal  Result Date: 03/07/2016 CLINICAL DATA:  Recently diagnosed with acute tubular necrosis. History of CHF, alcoholic hepatitis and cirrhosis. EXAM: RENAL / URINARY TRACT ULTRASOUND COMPLETE COMPARISON:  None in PACs FINDINGS: Right Kidney: Length: 13.3 cm. The renal cortical echotexture is approximately equal to that of the adjacent liver. There is no cortical atrophy. There is no hydronephrosis. No stones are evident. Left Kidney: Length: 12.8 cm. The renal cortical echotexture is similar to that on the right. There is no hydronephrosis. No cortical atrophy. No evidence of stones. Bladder: The urinary bladder is decompressed with a Foley catheter. IMPRESSION: Slightly increased renal cortical echotexture as compared to the liver though this may be spurring this given the patient's known cirrhosis. No evidence of cortical atrophy or obstruction. Nondistended urinary bladder with Foley catheter present. Electronically Signed   By: David  Martinique M.D.   On: 03/07/2016 11:36   Dg Chest [facility]  1 View  Result Date: 03/11/2016 CLINICAL DATA:  PICC line placement. EXAM: PORTABLE CHEST 1 VIEW COMPARISON:  March 09, 2006 FINDINGS: Stable cardiomegaly. The hila and mediastinum are unremarkable. A new right PICC line has been placed. It is difficult to see the distal tip but I believe it terminates in the central SVC. No focal infiltrate or overt edema. IMPRESSION: 1. New  right PICC line. The distal tip is difficult to visualize but appears to terminate centrally, likely within the SVC. Electronically Signed   By: Dorise Bullion III M.D   On: 03/11/2016 15:30   Dg Chest Port 1 View  Result Date: 03/09/2016 CLINICAL DATA:  Hypoxia and rectal bleeding EXAM: PORTABLE CHEST 1 VIEW COMPARISON:  Chest radiograph 03/06/2016 FINDINGS: Cardiac silhouette remains enlarged. There is slightly improved aeration of the lung bases with associated slight decrease in pulmonary edema. No pneumothorax or sizable pleural effusion. No focal airspace consolidation. IMPRESSION: Slightly improved mild pulmonary edema. Electronically Signed   By: Ulyses Jarred M.D.   On: 03/09/2016 13:52  [2 weeks]   Assessment: 45 year old male with decompensated ETOH cirrhosis, presenting with anasarca, anuria, 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 MELD-Na 29 on 03/12/16.  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 Immediately after colonoscopy had large amount of fresh blood, which tapered off, self-limiting. Hgb down some today, plans for prbcs. In setting of coagulopathy and INR 2.25, bleeding likely secondary to coagulopathy. No improvement with Vit K.   Anasarca: Patient feels this has improved.  Continues to diurese well and Cr is improving. Diuretic management per nephrology and lasix has been discontinued, Demadex added. Albumin 2.   Overall weight up 13 pounds this admission. I suspect initial weight was outlier; however, patient weighted 337 at [facility] in 01/2016 and now 376.   Plan: 1. Blood transfusion as planned.  2. Continue Nadolol at current dose. Reevaluate response over the next couple of days as well with the addition of prbcs.  3. Diuresis per nephrology. Patient  has not received IV albumin this admission. On 2g Na restricted diet and fluid restrictions.  4. Appreciate Ms. Weisner's educational services provided to patient AND wife regarding 2 gram sodium diet.   Laureen Ochs. Bernarda Caffey [facility] [phone] 10/18/201712:33 PM     LOS: 8 days

## 2016-03-14 NOTE — Progress Notes (Signed)
PROGRESS NOTE    Antonio [name]  [id number] DOB: 31-Jul-1970 DOA: 03/06/2016 PCP: Deloria Lair, MD    Brief Narrative:  45 y.o. male with medical history significant of ESLD and CKD with morbid obesity.  He presents today for worsening LE edema and anuria. In the outpatient setting, patient was given an increased dose of diuretic with little to no improvement. Patient was found to have worsening renal function on presentation and was admitted for management of volume overload.  Assessment & Plan:   Principal Problem:   Decompensation of cirrhosis of liver (HCC) Active Problems:   Anemia   Anasarca   Thrombocytopenia (HCC)   Acute renal failure (HCC)   Lower extremity edema   Hyponatremia   Hyperglycemia   Protein-calorie malnutrition, severe (HCC)   CHF (congestive heart failure) (HCC)   Rectal bleeding   Hypokalemia   Elevated brain natriuretic peptide (BNP) level   ATN (acute tubular necrosis) (HCC)   Decompensated cirrhosis of liver/ETOH Cirrhosis -Function profile is improving -GI following. GI recommendations to continue nadolol 40 mg by mouth daily and 20 mg by mouth in evening. Goal heart rate 55.  ARF -Renal function continuing to improve with Lasix -Patient still edematous, albeit improved -Discussed case with nephrology. -Continue oral diuresis with demadex. Is 14 L negative since admission.  Hyponatremia -Sodium stable at around 130. -Suspect hyponatremia secondary to volume overload with decompensated cirrhosis of the liver.  Acutely decompensated chronic CHF -Nephrology following. -Patient on Demadex per nephrology recommendations -Appears to be improving, with improved lower extremity edema  Suspected acute blood loss Anemia from suspected GI bleeding -Thus far during this admission, patient has received 1 unit PRBCs on 03/07/2016 and 1 unit on 03/09/2016. -This am Hb is 6.6, will receive 2 units of PRBCs. -Continue to follow CBC, transfuse as  needed -Vitamin K per GI recommendations received on 10/17. -INR remains well within therapeutic range.  Thrombocytopenia -Platelets stable -Repeat CBC in the morning  Hyperglycemia -Morning glucose noted to be 88 -Continue to monitor  Malnutrition -Appreciate input by nutrition -For now, continue fluid restricted diet  Hypokalemia -Potassium 3.1 today, lower than yesterday -Have check magnesium today, normal at 2.0 -Plans to increase potassium to 3 times a day dosing  DVT prophylaxis: SCDs Code Status: Full code Family Communication: Patient in room, family at bedside Disposition Plan: Uncertain at this time [facility] for [place] home in 1-2 days.  Consultants:   Gastroenterology  Nephrology  Procedures:     Antimicrobials: Anti-infectives    Start     Dose/Rate Route Frequency Ordered Stop   03/07/16 1400  cefTRIAXone (ROCEPHIN) 1 g in dextrose 5 % 50 mL IVPB    Comments:  Please dose adjust if needed for renal insufficency   1 g 100 mL/hr over 30 Minutes Intravenous Every 24 hours 03/07/16 1339 03/13/16 1441   03/06/16 1800  rifaximin (XIFAXAN) tablet 550 mg     550 mg Oral 2 times daily 03/06/16 1734        Subjective: Reports feeling better today. Swelling seems improved per patient  Objective: Vitals:   03/13/16 1020 03/13/16 1323 03/13/16 2118 03/14/16 0509  BP:  (!) 118/41 (!) 104/45 (!) 110/38  Pulse:  73 66 68  Resp:  20 20 20   Temp:  98.8 F (37.1 C) 98.1 F (36.7 C) 97.7 F (36.5 C)  TempSrc:  Oral Oral Oral  SpO2:  100% 100% 100%  Weight: (!) 170.9 kg (376 lb 12.8 oz)  Height:        Intake/Output Summary (Last 24 hours) at 03/14/16 1115 Last data filed at 03/14/16 0920  Gross per 24 hour  Intake             1334 ml  Output             1250 ml  Net               84 ml   Filed Weights   03/10/16 0400 03/13/16 0551 03/13/16 1020  Weight: (!) 168.7 kg (371 lb 14.7 oz) (!) 168.4 kg (371 lb 4.1 oz) (!) 170.9 kg (376 lb 12.8 oz)     Examination:  General exam: Awake, lying in bed, no acute distress Respiratory system: Speaking full sentences, normal chest rise, no audible wheezing Cardiovascular system: Regular rate, S1-S2 Gastrointestinal system: Nondistended, positive bowel sounds Central nervous system: No seizures, no tremors Extremities: No cyanosis, perfused Skin: No notable skin lesions seen, no rashes Psychiatry: Normal affect, no visual hallucinations  Data Reviewed: I have personally reviewed following labs and imaging studies  CBC:  Recent Labs Lab 03/10/16 0454 03/10/16 1612 03/11/16 0448 03/12/16 0604 03/13/16 0512 03/14/16 0524  WBC 11.3*  --  9.5 9.2 9.7 8.8  HGB 7.4* 7.5* 7.5* 7.2* 7.1* 6.6*  HCT 22.7* 23.0* 23.0* 22.5* 21.5* 21.3*  MCV 93.8  --  93.5 95.3 95.1 96.8  PLT 138*  --  141* 130* 135* 0000000*   Basic Metabolic Panel:  Recent Labs Lab 03/09/16 0445 03/10/16 0454 03/11/16 0448 03/12/16 0604 03/13/16 0512 03/14/16 0524  NA 131* 132* 132* 131* 132* 130*  K 2.9* 3.6 3.1* 3.3* 3.1* 3.1*  CL 99* 102 102 100* 102 101  CO2 23 25 24 27 25 23   GLUCOSE 119* 109* 100* 90 88 129*  BUN 53* 54* 51* 48* 46* 46*  CREATININE 2.21* 2.00* 1.69* 1.60* 1.44* 1.48*  CALCIUM 8.3* 8.4* 8.3* 8.2* 8.3* 8.1*  MG 1.8  --   --   --  2.0  --    GFR: Estimated Creatinine Clearance: 107.1 mL/min (by C-G formula based on SCr of 1.48 mg/dL (H)). Liver Function Tests:  Recent Labs Lab 03/10/16 0454 03/11/16 0448 03/12/16 0604 03/13/16 0512 03/14/16 0524  AST 64* 60* 57* 57* 59*  ALT 22 20 20 19 18   ALKPHOS 204* 197* 152* 151* 146*  BILITOT 4.4* 4.5* 5.1* 4.3* 4.2*  PROT 6.9 7.0 7.0 6.8 6.7  ALBUMIN 2.1* 2.1* 2.0* 2.0* 2.0*   No results for input(s): LIPASE, AMYLASE in the last 168 hours. No results for input(s): AMMONIA in the last 168 hours. Coagulation Profile:  Recent Labs Lab 03/08/16 0552 03/12/16 1642 03/14/16 0524  INR 2.18 2.25 2.34   Cardiac Enzymes: No results for  input(s): CKTOTAL, CKMB, CKMBINDEX, TROPONINI in the last 168 hours. BNP (last 3 results) No results for input(s): PROBNP in the last 8760 hours. HbA1C: No results for input(s): HGBA1C in the last 72 hours. CBG: No results for input(s): GLUCAP in the last 168 hours. Lipid Profile: No results for input(s): CHOL, HDL, LDLCALC, TRIG, CHOLHDL, LDLDIRECT in the last 72 hours. Thyroid Function Tests: No results for input(s): TSH, T4TOTAL, FREET4, T3FREE, THYROIDAB in the last 72 hours. Anemia Panel: No results for input(s): VITAMINB12, FOLATE, FERRITIN, TIBC, IRON, RETICCTPCT in the last 72 hours. Sepsis Labs: No results for input(s): PROCALCITON, LATICACIDVEN in the last 168 hours.  Recent Results (from the past 240 hour(s))  MRSA PCR Screening  Status: None   Collection Time: 03/06/16  6:00 PM  Result Value Ref Range Status   MRSA by PCR NEGATIVE NEGATIVE Final    Comment:        The GeneXpert MRSA Assay (FDA approved for NASAL specimens only), is one component of a comprehensive MRSA colonization surveillance program. It is not intended to diagnose MRSA infection nor to guide or monitor treatment for MRSA infections.      Radiology Studies: No results found.  Scheduled Meds: . sodium chloride   Intravenous Once  . docusate sodium  100 mg Oral BID  . feeding supplement (PRO-STAT SUGAR FREE 64)  45 mL Oral TID  . folic acid  1 mg Oral Daily  . lactulose  20 g Oral TID  . magnesium oxide  400 mg Oral BID  . nadolol  20 mg Oral QPC supper  . nadolol  40 mg Oral Daily  . pantoprazole  40 mg Oral BID AC  . potassium chloride  40 mEq Oral TID  . rifaximin  550 mg Oral BID  . sodium chloride flush  3 mL Intravenous Q12H  . thiamine  100 mg Oral Daily  . torsemide  60 mg Oral Daily   Continuous Infusions:    LOS: 8 days   Lelon Frohlich, MD [facility] Pager [phone]  If 7PM-7AM, please contact night-coverage [email or web address] Password  [email or web address] 03/14/2016, 11:15 AM

## 2016-03-14 NOTE — Progress Notes (Signed)
CRITICAL VALUE ALERT  Critical value received:  Hemiglobin 6.6  Date of notification 10/18  Time of notification:  0640  Critical value read back:yes  Nurse who received alert: K. Marcello Moores RN  MD notified (1st page):  Dr. Marin Comment  Time of first page:  0645  MD notified (2nd page):  Time of second page:  Responding MD: Dr. Marin Comment  Time MD responded:  [phone]

## 2016-03-14 NOTE — Progress Notes (Signed)
Antonio [name]  MRN: [id number]  DOB/AGE: 08/13/1970 45 y.o.  Primary Care Physician:Antonio B, MD  Admit date: 03/06/2016  Chief Complaint:  Chief Complaint  Patient presents with  . Leg Swelling    [name]-Pt presented on  03/06/2016 with  Chief Complaint  Patient presents with  . Leg Swelling  .    Pt  Offers no new complaints.   Meds . docusate sodium  100 mg Oral BID  . feeding supplement (PRO-STAT SUGAR FREE 64)  45 mL Oral TID  . folic acid  1 mg Oral Daily  . lactulose  20 g Oral TID  . magnesium oxide  400 mg Oral BID  . nadolol  20 mg Oral QPC supper  . nadolol  40 mg Oral Daily  . pantoprazole  40 mg Oral BID AC  . potassium chloride  40 mEq Oral TID  . rifaximin  550 mg Oral BID  . sodium chloride flush  3 mL Intravenous Q12H  . thiamine  100 mg Oral Daily  . torsemide  60 mg Oral Daily      Physical Exam: Vital signs in last 24 hours: Temp:  [98.1 F (36.7 C)-98.8 F (37.1 C)] 98.1 F (36.7 C) (10/17 2118) Pulse Rate:  [66-73] 66 (10/17 2118) Resp:  [18-20] 20 (10/17 2118) BP: (104-118)/(41-57) 104/45 (10/17 2118) SpO2:  [100 %] 100 % (10/17 2118) Weight:  [371 lb 4.1 oz (168.4 kg)-376 lb 12.8 oz (170.9 kg)] 376 lb 12.8 oz (170.9 kg) (10/17 1020) Weight change: 5 lb 8.7 oz (2.515 kg) Last BM Date: 03/13/16  Intake/Output from previous day: 10/17 0701 - 10/18 0700 In: 1500 [P.O.:1500] Out: 1950 [Urine:1950] Total I/O In: 380 [P.O.:380] Out: 300 [Urine:300]   Physical Exam: General- pt is awake,alert, follows commands Resp- No acute REsp distress,decreased bs at bases + CVS- S1S2 regular in rate and rhythm GIT- BS+, soft, distended, morbidly obese EXT- 1+ LE Edema, No Cyanosis( edema better)    Lab Results: CBC  Recent Labs  03/12/16 0604 03/13/16 0512  WBC 9.2 9.7  HGB 7.2* 7.1*  HCT 22.5* 21.5*  PLT 130* 135*    BMET  Recent Labs  03/12/16 0604 03/13/16 0512  NA 131* 132*  K 3.3* 3.1*  CL 100* 102  CO2 27 25   GLUCOSE 90 88  BUN 48* 46*  CREATININE 1.60* 1.44*  CALCIUM 8.2* 8.3*   Creat trend 2017   3.26==>3.58==>2.52=>2.2=>2.0==>1.69=>1.60=>1.44 2017  1.2--2.0( august admission)     MICRO Recent Results (from the past 240 hour([name]))  MRSA PCR Screening     Status: None   Collection Time: 03/06/16  6:00 PM  Result Value Ref Range Status   MRSA by PCR NEGATIVE NEGATIVE Final    Comment:        The GeneXpert MRSA Assay (FDA approved for NASAL specimens only), is one component of a comprehensive MRSA colonization surveillance program. It is not intended to diagnose MRSA infection nor to guide or monitor treatment for MRSA infections.       Lab Results  Component Value Date   CALCIUM 8.3 (L) 03/13/2016   PHOS 4.4 01/17/2016         Impression: 1)Renal  AKI secondary to Prerenal/ATN                AKI possibly Hepatorenal                Data against hepatorenal  Urine Na more than 10                Not Oliguric                   AKI improving  2)HTN  BP stable  Medication- On Diuretics-  3)Anemia HGb  Low.  Sec to GI bleeding received PRBC GI and  Primary team  following  4)Anasarca- on Diuretics  5)Thrombocytopenia Sec to Liver related issues Primary MD follwoing following  6)Electrolytes  Hypokalemic      Being replete  Hyponatremic     Hypervolemia hyponatremia     improving  7)Acid base Co2 at goal     Plan:  Will continue current care Will follow bmet        Antonio [name],Antonio [name] 03/14/2016, 4:03 AM

## 2016-03-15 LAB — RENAL FUNCTION PANEL
ANION GAP: 5 (ref 5–15)
Albumin: 2 g/dL — ABNORMAL LOW (ref 3.5–5.0)
BUN: 52 mg/dL — ABNORMAL HIGH (ref 6–20)
CHLORIDE: 102 mmol/L (ref 101–111)
CO2: 22 mmol/L (ref 22–32)
Calcium: 8.2 mg/dL — ABNORMAL LOW (ref 8.9–10.3)
Creatinine, Ser: 2 mg/dL — ABNORMAL HIGH (ref 0.61–1.24)
GFR calc non Af Amer: 39 mL/min — ABNORMAL LOW (ref >60)
GFR, EST AFRICAN AMERICAN: 45 mL/min — AB (ref >60)
Glucose, Bld: 127 mg/dL — ABNORMAL HIGH (ref 65–99)
POTASSIUM: 3.8 mmol/L (ref 3.5–5.1)
Phosphorus: 5.2 mg/dL — ABNORMAL HIGH (ref 2.5–4.6)
Sodium: 129 mmol/L — ABNORMAL LOW (ref 135–145)

## 2016-03-15 LAB — TYPE AND SCREEN
ABO/RH(D): O NEG
ANTIBODY SCREEN: NEGATIVE
Unit division: 0
Unit division: 0

## 2016-03-15 MED ORDER — TORSEMIDE 20 MG PO TABS
100.0000 mg | ORAL_TABLET | Freq: Every day | ORAL | Status: DC
  Administered 2016-03-15: 100 mg via ORAL
  Filled 2016-03-15: qty 5

## 2016-03-15 MED ORDER — METOLAZONE 5 MG PO TABS
5.0000 mg | ORAL_TABLET | Freq: Two times a day (BID) | ORAL | Status: DC
  Administered 2016-03-15 – 2016-03-19 (×9): 5 mg via ORAL
  Filled 2016-03-15 (×10): qty 1

## 2016-03-15 NOTE — Progress Notes (Addendum)
Subjective: Interval History: Patient denies any nausea or vomiting. Still complains of increase Leg swelling. His urinary output seems to have declined the last 24 hours.  Objective: Vital signs in last 24 hours: Temp:  [97.5 F (36.4 C)-98.1 F (36.7 C)] 97.5 F (36.4 C) (10/19 0557) Pulse Rate:  [62-100] 70 (10/19 0557) Resp:  [18] 18 (10/19 0557) BP: (89-107)/(37-56) 100/50 (10/19 0557) SpO2:  [100 %] 100 % (10/19 0557) Weight change:   Intake/Output from previous day: 10/18 0701 - 10/19 0700 In: [id number] [P.O.:484; Blood:986] Out: 1150 [Urine:1150] Intake/Output this shift: No intake/output data recorded.  General appearance: alert, cooperative and no distress Resp: diminished breath sounds bilaterally GI: Distended, nontender and difficult to palpate organomegaly Extremities: edema He has 2-3+ edema  Lab Results:  Recent Labs  03/13/16 0512 03/14/16 0524 03/14/16 2247  WBC 9.7 8.8  --   HGB 7.1* 6.6* 7.7*  HCT 21.5* 21.3* 23.4*  PLT 135* 125*  --    BMET:   Recent Labs  03/13/16 0512 03/14/16 0524  NA 132* 130*  K 3.1* 3.1*  CL 102 101  CO2 25 23  GLUCOSE 88 129*  BUN 46* 46*  CREATININE 1.44* 1.48*  CALCIUM 8.3* 8.1*   No results for input(s): PTH in the last 72 hours. Iron Studies: No results for input(s): IRON, TIBC, TRANSFERRIN, FERRITIN in the last 72 hours.  Studies/Results: No results found.  I have reviewed the patient's current medications.  Assessment/Plan: Problem #1 acute kidney injury superimposed on chronic. Presently his renal function seems to be improving.Possibly reaching his baseline. Problem #2 anasarca: Presently he is on Demadex. He still patient with significant anasarca. His urine output has declined. Problem #3 hypokalemia: Most likely from diuretics. Presently on potassium supplement Problem #4 Hypervolemic hyponatremia: His sodium is low but stable. Problem #5 anemia: Most likely from GI bleeding. His hemoglobin is  somewhat better. Problem #6 obesity Problem #7 history of liver cirrhosis  Plan: 1] We'll increase Demadex to 100 mg by mouth by mouth once a day 2] metolazone 5 mg by mouth twice a day 3] patient advised to decrease his salt and fluid intake 4] will check renal panel today and in the morning   LOS: 9 days   Antonio [name] S 03/15/2016,8:59 AM

## 2016-03-15 NOTE — Progress Notes (Signed)
PROGRESS NOTE    Antonio [name]  [id number] DOB: [birth year] DOA: 03/06/2016 PCP: Deloria Lair, MD    Brief Narrative:  45 y.o. male with medical history significant of ESLD and CKD with morbid obesity.  He presents today for worsening LE edema and anuria. In the outpatient setting, patient was given an increased dose of diuretic with little to no improvement. Patient was found to have worsening renal function on presentation and was admitted for management of volume overload.  Assessment & Plan:   Principal Problem:   Decompensation of cirrhosis of liver (HCC) Active Problems:   Anemia   Anasarca   Thrombocytopenia (HCC)   Acute renal failure (HCC)   Lower extremity edema   Hyponatremia   Hyperglycemia   Protein-calorie malnutrition, severe (HCC)   CHF (congestive heart failure) (HCC)   Rectal bleeding   Hypokalemia   Elevated brain natriuretic peptide (BNP) level   ATN (acute tubular necrosis) (HCC)   Decompensated cirrhosis of liver/ETOH Cirrhosis -Function profile is improving. Recheck LFTs in a.m. -GI following. GI recommendations to continue nadolol 40 mg by mouth daily and 20 mg by mouth in evening. Goal heart rate 55.  ARF -Renal function a little worse today, up to 2.0 from 1.48. -Patient still edematous, urine output seems to have decreased over past 24 hours -Discussed case with nephrology. Plan to increase Demadex and add metolazone. -Continue oral diuresis with demadex. Is 13.2 L negative since admission.  Hyponatremia -Sodium stable at around 130. -Suspect hyponatremia secondary to volume overload with decompensated cirrhosis of the liver.  Acutely decompensated chronic CHF -Nephrology following. -Patient on Demadex per nephrology recommendations, will increase demadex dose and add metolazone. -Still with marked volume overload on exam.  Suspected acute blood loss Anemia from suspected GI bleeding -Thus far during this admission, patient has  received 1 unit PRBCs on 03/07/2016 and 1 unit on 03/09/2016. Received 2 units of PRBCs on 10/18 for hemoglobin of 6.6, hemoglobin is only up to 7.7 today. Still has been having some low-grade hematochezia with bowel movements, will need to monitor hemoglobin closely, GI is involved in his care. -Continue to follow CBC, transfuse as needed  Thrombocytopenia -Platelets stable -Repeat CBC in the morning  Malnutrition -Appreciate input by nutrition -For now, continue fluid restricted diet  Hypokalemia -Potassium 3.8. Replaced. -Have check magnesium today, normal at 2.0   DVT prophylaxis: SCDs Code Status: Full code Family Communication: Patient in room, family at bedside Disposition Plan: Uncertain at this time [facility] for [place] home in 2-3 days.  Consultants:   Gastroenterology  Nephrology  Procedures:     Antimicrobials: Anti-infectives    Start     Dose/Rate Route Frequency Ordered Stop   03/07/16 1400  cefTRIAXone (ROCEPHIN) 1 g in dextrose 5 % 50 mL IVPB    Comments:  Please dose adjust if needed for renal insufficency   1 g 100 mL/hr over 30 Minutes Intravenous Every 24 hours 03/07/16 1339 03/13/16 1441   03/06/16 1800  rifaximin (XIFAXAN) tablet 550 mg     550 mg Oral 2 times daily 03/06/16 1734        Subjective: Reports feeling better today. Swelling seems improved per patient  Objective: Vitals:   03/14/16 1807 03/14/16 1825 03/14/16 2150 03/15/16 0557  BP: (!) 94/48 (!) 93/46 (!) 104/46 (!) 100/50  Pulse: 100 62 62 70  Resp: 18 18 18 18   Temp: 97.7 F (36.5 C) 97.7 F (36.5 C) 97.8 F (36.6 C) 97.5 F (36.4 C)  TempSrc: Oral Oral Oral Oral  SpO2:  100% 100% 100%  Weight:      Height:        Intake/Output Summary (Last 24 hours) at 03/15/16 1124 Last data filed at 03/15/16 0939  Gross per 24 hour  Intake             1709 ml  Output              900 ml  Net              809 ml   Filed Weights   03/10/16 0400 03/13/16 0551 03/13/16 1020    Weight: (!) 168.7 kg (371 lb 14.7 oz) (!) 168.4 kg (371 lb 4.1 oz) (!) 170.9 kg (376 lb 12.8 oz)    Examination:  General exam: Awake, lying in bed, no acute distress Respiratory system: Speaking full sentences, normal chest rise, no audible wheezing Cardiovascular system: Regular rate, S1-S2 Gastrointestinal system: Nondistended, positive bowel sounds Central nervous system: No seizures, no tremors Extremities: No cyanosis, perfused Skin: No notable skin lesions seen, no rashes Psychiatry: Normal affect, no visual hallucinations  Data Reviewed: I have personally reviewed following labs and imaging studies  CBC:  Recent Labs Lab 03/10/16 0454  03/11/16 0448 03/12/16 0604 03/13/16 0512 03/14/16 0524 03/14/16 2247  WBC 11.3*  --  9.5 9.2 9.7 8.8  --   HGB 7.4*  < > 7.5* 7.2* 7.1* 6.6* 7.7*  HCT 22.7*  < > 23.0* 22.5* 21.5* 21.3* 23.4*  MCV 93.8  --  93.5 95.3 95.1 96.8  --   PLT 138*  --  141* 130* 135* 125*  --   < > = values in this interval not displayed. Basic Metabolic Panel:  Recent Labs Lab 03/09/16 0445  03/11/16 0448 03/12/16 0604 03/13/16 0512 03/14/16 0524 03/15/16 0921  NA 131*  < > 132* 131* 132* 130* 129*  K 2.9*  < > 3.1* 3.3* 3.1* 3.1* 3.8  CL 99*  < > 102 100* 102 101 102  CO2 23  < > 24 27 25 23 22   GLUCOSE 119*  < > 100* 90 88 129* 127*  BUN 53*  < > 51* 48* 46* 46* 52*  CREATININE 2.21*  < > 1.69* 1.60* 1.44* 1.48* 2.00*  CALCIUM 8.3*  < > 8.3* 8.2* 8.3* 8.1* 8.2*  MG 1.8  --   --   --  2.0  --   --   PHOS  --   --   --   --   --   --  5.2*  < > = values in this interval not displayed. GFR: Estimated Creatinine Clearance: 79.3 mL/min (by C-G formula based on SCr of 2 mg/dL (H)). Liver Function Tests:  Recent Labs Lab 03/10/16 0454 03/11/16 0448 03/12/16 0604 03/13/16 0512 03/14/16 0524 03/15/16 0921  AST 64* 60* 57* 57* 59*  --   ALT 22 20 20 19 18   --   ALKPHOS 204* 197* 152* 151* 146*  --   BILITOT 4.4* 4.5* 5.1* 4.3* 4.2*  --    PROT 6.9 7.0 7.0 6.8 6.7  --   ALBUMIN 2.1* 2.1* 2.0* 2.0* 2.0* 2.0*   No results for input(s): LIPASE, AMYLASE in the last 168 hours. No results for input(s): AMMONIA in the last 168 hours. Coagulation Profile:  Recent Labs Lab 03/12/16 1642 03/14/16 0524  INR 2.25 2.34   Cardiac Enzymes: No results for input(s): CKTOTAL, CKMB, CKMBINDEX, TROPONINI in the last 168  hours. BNP (last 3 results) No results for input(s): PROBNP in the last 8760 hours. HbA1C: No results for input(s): HGBA1C in the last 72 hours. CBG: No results for input(s): GLUCAP in the last 168 hours. Lipid Profile: No results for input(s): CHOL, HDL, LDLCALC, TRIG, CHOLHDL, LDLDIRECT in the last 72 hours. Thyroid Function Tests: No results for input(s): TSH, T4TOTAL, FREET4, T3FREE, THYROIDAB in the last 72 hours. Anemia Panel: No results for input(s): VITAMINB12, FOLATE, FERRITIN, TIBC, IRON, RETICCTPCT in the last 72 hours. Sepsis Labs: No results for input(s): PROCALCITON, LATICACIDVEN in the last 168 hours.  Recent Results (from the past 240 hour(s))  MRSA PCR Screening     Status: None   Collection Time: 03/06/16  6:00 PM  Result Value Ref Range Status   MRSA by PCR NEGATIVE NEGATIVE Final    Comment:        The GeneXpert MRSA Assay (FDA approved for NASAL specimens only), is one component of a comprehensive MRSA colonization surveillance program. It is not intended to diagnose MRSA infection nor to guide or monitor treatment for MRSA infections.      Radiology Studies: No results found.  Scheduled Meds: . docusate sodium  100 mg Oral BID  . feeding supplement (PRO-STAT SUGAR FREE 64)  45 mL Oral TID  . folic acid  1 mg Oral Daily  . lactulose  20 g Oral TID  . magnesium oxide  400 mg Oral BID  . metolazone  5 mg Oral BID  . nadolol  20 mg Oral QPC supper  . nadolol  40 mg Oral Daily  . pantoprazole  40 mg Oral BID AC  . potassium chloride  40 mEq Oral TID  . rifaximin  550 mg Oral  BID  . sodium chloride flush  3 mL Intravenous Q12H  . thiamine  100 mg Oral Daily  . torsemide  100 mg Oral Daily   Continuous Infusions:    LOS: 9 days   Lelon Frohlich, MD [facility] Pager [phone]  If 7PM-7AM, please contact night-coverage [email or web address] Password [email or web address] 03/15/2016, 11:24 AM

## 2016-03-15 NOTE — Care Management Note (Signed)
Case Management Note  Patient Details  Name: Antonio [name] MRN: [id number] Date of Birth: 09/07/1970  If discussed at [place] Length of Stay Meetings, dates discussed:  03/15/2016   Sherald Barge, RN 03/15/2016, 2:25 PM

## 2016-03-15 NOTE — Progress Notes (Signed)
Subjective:  Patient worried that Dr. Hinda Lenis mentioned dialysis. Appetite had been ok until then. No abdominal pain. No BM in over 24 hours. No rectal bleeding.   Objective: Vital signs in last 24 hours: Temp:  [97.5 F (36.4 C)-98.1 F (36.7 C)] 97.5 F (36.4 C) (10/19 0557) Pulse Rate:  [62-100] 70 (10/19 0557) Resp:  [18] 18 (10/19 0557) BP: (89-107)/(37-56) 100/50 (10/19 0557) SpO2:  [100 %] 100 % (10/19 0557) Last BM Date: 03/13/16 General:   Alert,  Well-developed, well-nourished, pleasant and cooperative in NAD. Wife at bedside.  Head:  Normocephalic and atraumatic. Eyes:  Sclera clear, no icterus.  Abdomen:  Soft, nontender. Mid abd very soft. Pitting edema in flanks. Normal bowel sounds, without guarding, and without rebound.   Extremities:   .Without clubbing, deformity. 2-3+ pitting edema lower extremities, lesser degree in thighs. Neurologic:  Alert and  oriented x4;  grossly normal neurologically. Skin:  Intact without significant lesions or rashes. Psych:  Alert and cooperative. Normal mood and affect.  Intake/Output from previous day: 10/18 0701 - 10/19 0700 In: [id number] [P.O.:484; Blood:986] Out: 1150 [Urine:1150] Intake/Output this shift: No intake/output data recorded.  Lab Results: CBC  Recent Labs  03/13/16 0512 03/14/16 0524 03/14/16 2247  WBC 9.7 8.8  --   HGB 7.1* 6.6* 7.7*  HCT 21.5* 21.3* 23.4*  MCV 95.1 96.8  --   PLT 135* 125*  --    BMET  Recent Labs  03/13/16 0512 03/14/16 0524 03/15/16 0921  NA 132* 130* 129*  K 3.1* 3.1* 3.8  CL 102 101 102  CO2 25 23 22   GLUCOSE 88 129* 127*  BUN 46* 46* 52*  CREATININE 1.44* 1.48* 2.00*  CALCIUM 8.3* 8.1* 8.2*   LFTs  Recent Labs  03/13/16 0512 03/14/16 0524 03/15/16 0921  BILITOT 4.3* 4.2*  --   ALKPHOS 151* 146*  --   AST 57* 59*  --   ALT 19 18  --   PROT 6.8 6.7  --   ALBUMIN 2.0* 2.0* 2.0*   No results for input(s): LIPASE in the last 72 hours. PT/INR  Recent Labs  03/12/16 1642 03/14/16 0524  LABPROT 25.2* 26.0*  INR 2.25 2.34      Imaging Studies: Dg Chest 2 View  Result Date: 03/06/2016 CLINICAL DATA:  Dyspnea. EXAM: CHEST  2 VIEW COMPARISON:  01/09/2016 chest radiograph. FINDINGS: Stable cardiomediastinal silhouette with mild cardiomegaly. No pneumothorax. No pleural effusion. Mild pulmonary edema. No acute consolidative airspace disease. IMPRESSION: Mild congestive heart failure. Electronically Signed   By: Ilona Sorrel M.D.   On: 03/06/2016 13:26   US Renal  Result Date: 03/07/2016 CLINICAL DATA:  Recently diagnosed with acute tubular necrosis. History of CHF, alcoholic hepatitis and cirrhosis. EXAM: RENAL / URINARY TRACT ULTRASOUND COMPLETE COMPARISON:  None in PACs FINDINGS: Right Kidney: Length: 13.3 cm. The renal cortical echotexture is approximately equal to that of the adjacent liver. There is no cortical atrophy. There is no hydronephrosis. No stones are evident. Left Kidney: Length: 12.8 cm. The renal cortical echotexture is similar to that on the right. There is no hydronephrosis. No cortical atrophy. No evidence of stones. Bladder: The urinary bladder is decompressed with a Foley catheter. IMPRESSION: Slightly increased renal cortical echotexture as compared to the liver though this may be spurring this given the patient's known cirrhosis. No evidence of cortical atrophy or obstruction. Nondistended urinary bladder with Foley catheter present. Electronically Signed   By: David  Martinique M.D.   On: 03/07/2016  11:36   Dg Chest Port 1 View  Result Date: 03/11/2016 CLINICAL DATA:  PICC line placement. EXAM: PORTABLE CHEST 1 VIEW COMPARISON:  March 09, 2006 FINDINGS: Stable cardiomegaly. The hila and mediastinum are unremarkable. A new right PICC line has been placed. It is difficult to see the distal tip but I believe it terminates in the central SVC. No focal infiltrate or overt edema. IMPRESSION: 1. New right PICC line. The distal tip is  difficult to visualize but appears to terminate centrally, likely within the SVC. Electronically Signed   By: Dorise Bullion III M.D   On: 03/11/2016 15:30   Dg Chest Port 1 View  Result Date: 03/09/2016 CLINICAL DATA:  Hypoxia and rectal bleeding EXAM: PORTABLE CHEST 1 VIEW COMPARISON:  Chest radiograph 03/06/2016 FINDINGS: Cardiac silhouette remains enlarged. There is slightly improved aeration of the lung bases with associated slight decrease in pulmonary edema. No pneumothorax or sizable pleural effusion. No focal airspace consolidation. IMPRESSION: Slightly improved mild pulmonary edema. Electronically Signed   By: Ulyses Jarred M.D.   On: 03/09/2016 13:52  [2 weeks]   Assessment: 45 year old male with decompensated ETOH cirrhosis, presenting with anasarca, anuria, 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 MELD-Na 29 on 03/12/16.  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 Immediately after colonoscopy had large amount of fresh blood, which tapered off, self-limiting. Required additional two units of prbcs yesterday with increase in Hgb to 1g/DL. In setting of coagulopathy and INR 2.25, bleeding likely secondary to coagulopathy. No improvement with Vit K.   Anasarca: Unchanged, with no significant improvement of third spacing. Creatinine up today. Nephrology on board. Urinary output declined. +I/O with additional 2 units of prbcs as well.     Plan: 1. Appreciate nephrology input. ?utilizing albumin. To discuss with Dr. Oneida Alar.  2. Patient is on 2g Na restricted diet and fluid restrictions as well. Dietician has educated both patient and wife.  3. Patient's heart rate is still above goal of 55 but with significant hypotension, would not increase nadolol further at this  time.   Laureen Ochs. Bernarda Caffey [facility] [phone] 10/19/20172:20 PM     LOS: 9 days

## 2016-03-16 LAB — HEPATIC FUNCTION PANEL
ALBUMIN: 2 g/dL — AB (ref 3.5–5.0)
ALK PHOS: 135 U/L — AB (ref 38–126)
ALT: 19 U/L (ref 17–63)
AST: 57 U/L — AB (ref 15–41)
BILIRUBIN DIRECT: 2 mg/dL — AB (ref 0.1–0.5)
BILIRUBIN TOTAL: 4.9 mg/dL — AB (ref 0.3–1.2)
Indirect Bilirubin: 2.9 mg/dL — ABNORMAL HIGH (ref 0.3–0.9)
Total Protein: 6.8 g/dL (ref 6.5–8.1)

## 2016-03-16 LAB — RENAL FUNCTION PANEL
Albumin: 2 g/dL — ABNORMAL LOW (ref 3.5–5.0)
Anion gap: 5 (ref 5–15)
BUN: 61 mg/dL — AB (ref 6–20)
CO2: 21 mmol/L — ABNORMAL LOW (ref 22–32)
Calcium: 8.2 mg/dL — ABNORMAL LOW (ref 8.9–10.3)
Chloride: 104 mmol/L (ref 101–111)
Creatinine, Ser: 2.53 mg/dL — ABNORMAL HIGH (ref 0.61–1.24)
GFR calc Af Amer: 34 mL/min — ABNORMAL LOW (ref >60)
GFR calc non Af Amer: 29 mL/min — ABNORMAL LOW (ref >60)
Glucose, Bld: 88 mg/dL (ref 65–99)
PHOSPHORUS: 5.2 mg/dL — AB (ref 2.5–4.6)
Potassium: 4.2 mmol/L (ref 3.5–5.1)
SODIUM: 130 mmol/L — AB (ref 135–145)

## 2016-03-16 LAB — CBC
HEMATOCRIT: 22.9 % — AB (ref 39.0–52.0)
HEMOGLOBIN: 7.3 g/dL — AB (ref 13.0–17.0)
MCH: 29.7 pg (ref 26.0–34.0)
MCHC: 31.9 g/dL (ref 30.0–36.0)
MCV: 93.1 fL (ref 78.0–100.0)
Platelets: 109 10*3/uL — ABNORMAL LOW (ref 150–400)
RBC: 2.46 MIL/uL — AB (ref 4.22–5.81)
RDW: 20.4 % — ABNORMAL HIGH (ref 11.5–15.5)
WBC: 10.2 10*3/uL (ref 4.0–10.5)

## 2016-03-16 MED ORDER — ALBUMIN HUMAN 25 % IV SOLN
25.0000 g | Freq: Two times a day (BID) | INTRAVENOUS | Status: DC
  Filled 2016-03-16 (×7): qty 100

## 2016-03-16 MED ORDER — ALBUMIN HUMAN 25 % IV SOLN
25.0000 g | Freq: Two times a day (BID) | INTRAVENOUS | Status: AC
  Administered 2016-03-16 – 2016-03-17 (×4): 25 g via INTRAVENOUS
  Filled 2016-03-16 (×4): qty 100

## 2016-03-16 MED ORDER — FUROSEMIDE 10 MG/ML IJ SOLN
200.0000 mg | Freq: Two times a day (BID) | INTRAMUSCULAR | Status: DC
  Administered 2016-03-16 – 2016-03-18 (×6): 200 mg via INTRAVENOUS
  Filled 2016-03-16 (×7): qty 20

## 2016-03-16 MED ORDER — POTASSIUM CHLORIDE CRYS ER 20 MEQ PO TBCR
40.0000 meq | EXTENDED_RELEASE_TABLET | Freq: Two times a day (BID) | ORAL | Status: DC
  Administered 2016-03-16 – 2016-03-17 (×3): 40 meq via ORAL
  Filled 2016-03-16 (×3): qty 2

## 2016-03-16 NOTE — Progress Notes (Signed)
Subjective: Feels good today, states fluid isn't coming off, unable to urinate and is wondering if he should have left foley in. States nephrology discussed with him the possibility of replacing foley and also possibility of dialysis. States Dr. Oneida Alar told him she didn't think he was at that point of HD yet. Denies abdominal pain, N/V, jaundice, acute mental status changes. Tolerating diet well. No other GI complaints.  Objective: Vital signs in last 24 hours: Temp:  [98.1 F (36.7 C)-98.9 F (37.2 C)] 98.5 F (36.9 C) (10/20 1036) Pulse Rate:  [62-67] 66 (10/20 1036) Resp:  [19-20] 19 (10/20 1036) BP: (97-101)/(41-48) 101/47 (10/20 1036) SpO2:  [98 %-100 %] 99 % (10/20 1036) Last BM Date: 03/15/16 General:   Alert and oriented, pleasant. Morbidly obese, anasarca. Eyes:  No icterus, sclera clear. Conjuctiva pink.  Heart:  S1, S2 present, no murmurs noted.  Lungs: Clear to auscultation bilaterally, without wheezing, rales, or rhonchi.  Abdomen:  Bowel sounds present, obese and firm, non-tender, non-distended. No rebound or guarding. Pitting edema noted to up to level of mid-abdomen. Msk:  Symmetrical without gross deformities. Pulses:  Normal pulses noted. Extremities:  Anasarca and significant edema noted bilateral lower extremities and up to abdomen. Neurologic:  Alert and  oriented x4;  grossly normal neurologically. Psych:  Alert and cooperative. Normal mood and affect.  Intake/Output from previous day: 10/19 0701 - 10/20 0700 In: 1683 [P.O.:1680; I.V.:3] Out: 600 [Urine:600] Intake/Output this shift: Total I/O In: 240 [P.O.:240] Out: 200 [Urine:200]  Lab Results:  Recent Labs  03/14/16 0524 03/14/16 2247 03/16/16 0513  WBC 8.8  --  10.2  HGB 6.6* 7.7* 7.3*  HCT 21.3* 23.4* 22.9*  PLT 125*  --  109*   BMET  Recent Labs  03/14/16 0524 03/15/16 0921 03/16/16 0513  NA 130* 129* 130*  K 3.1* 3.8 4.2  CL 101 102 104  CO2 23 22 21*  GLUCOSE 129* 127* 88   BUN 46* 52* 61*  CREATININE 1.48* 2.00* 2.53*  CALCIUM 8.1* 8.2* 8.2*   LFT  Recent Labs  03/14/16 0524 03/15/16 0921 03/16/16 0513  PROT 6.7  --  6.8  ALBUMIN 2.0* 2.0* 2.0*  2.0*  AST 59*  --  57*  ALT 18  --  19  ALKPHOS 146*  --  135*  BILITOT 4.2*  --  4.9*  BILIDIR  --   --  2.0*  IBILI  --   --  2.9*   PT/INR  Recent Labs  03/14/16 0524  LABPROT 26.0*  INR 2.34   Hepatitis Panel No results for input(s): HEPBSAG, HCVAB, HEPAIGM, HEPBIGM in the last 72 hours.   Studies/Results: No results found.  Assessment: 45 year old male with decompensated ETOH cirrhosis, presenting with anasarca, anuria, 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 MELD-Na 29 on 03/12/16.  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 Immediately after colonoscopy had large amount of fresh blood, which tapered off, self-limiting. Required additional two units of prbcs yesterday with increase in Hgb to 1g/DL. In setting of coagulopathy and INR 2.25, bleeding likely secondary to coagulopathy. No improvement with Vit K.Hgb this morning declined to 7.3 (from 7.7 two days ago). Plt remain low at 109.   Anasarca: Unchanged, with no significant improvement of third spacing. 2-3+ bilateral LE pitting edema and edema up to mid-abdomen. Urinary  output declined. +I/O yesterday with additional 2 units of prbcs as well. Kidney function today with increase in Cr to 2.53 from 2.0 yesterday, potassium normal. Appreciate nephrology input!  Cirrhosis: AST/ALT mild elevation but stable. Bili essentially stable though increased to 4.9 from 4.2 two days ago. Albumin low but stable at 2.0. Has been off ETOH for several weeks now. Heart rate 66, soft BPs and likey not tolerate increased non-selective  beta blocker dose.  MELD 30 (todays labs except INR 10/18)   Plan: 1. Monitor H/H 2. Transfuse as necessary 3. Diuresis per nephrology 4. Supportive measures 5. Continued Fluid restriction   Thank you for allowing [place] to participate in the care of [place], DNP, AGNP-C Adult & Gerontological Nurse Practitioner [facility]    LOS: 10 days    03/16/2016, 11:14 AM

## 2016-03-16 NOTE — Progress Notes (Signed)
Subjective: Interval History: Patient offers no complaints.  Objective: Vital signs in last 24 hours: Temp:  [98.1 F (36.7 C)-98.9 F (37.2 C)] 98.1 F (36.7 C) (10/20 0504) Pulse Rate:  [62-67] 63 (10/20 0504) Resp:  [20] 20 (10/20 0504) BP: (97-101)/(41-48) 97/41 (10/20 0504) SpO2:  [98 %-100 %] 100 % (10/20 0504) Weight change:   Intake/Output from previous day: 10/19 0701 - 10/20 0700 In: 1683 [P.O.:1680; I.V.:3] Out: 600 [Urine:600] Intake/Output this shift: Total I/O In: 240 [P.O.:240] Out: 200 [Urine:200]  General appearance: alert, cooperative and no distress Resp: diminished breath sounds bilaterally GI: Distended, nontender and difficult to palpate organomegaly Extremities: edema He has 2-3+ edema  Lab Results:  Recent Labs  03/14/16 0524 03/14/16 2247 03/16/16 0513  WBC 8.8  --  10.2  HGB 6.6* 7.7* 7.3*  HCT 21.3* 23.4* 22.9*  PLT 125*  --  109*   BMET:   Recent Labs  03/15/16 0921 03/16/16 0513  NA 129* 130*  K 3.8 4.2  CL 102 104  CO2 22 21*  GLUCOSE 127* 88  BUN 52* 61*  CREATININE 2.00* 2.53*  CALCIUM 8.2* 8.2*   No results for input(s): PTH in the last 72 hours. Iron Studies: No results for input(s): IRON, TIBC, TRANSFERRIN, FERRITIN in the last 72 hours.  Studies/Results: No results found.  I have reviewed the patient's current medications.  Assessment/Plan: Problem #1 acute kidney injury superimposed on chronic. Presently his renal function seems to be improving.Possibly reaching his baseline. Problem #2 anasarca: Presently he is on DemadexAnd metolazone. Presently nonoliguric but still remains with significant anasarca. Presently patient is on a maximum dose of Demadex and metolazone. Problem #3 hypokalemia: Most likely from diuretics. His potassium has corrected Problem #4 hypervolemic hyponatremia: His sodium is low but stable. Problem #5 anemia: Most likely from GI bleeding. His hemoglobin is stable. Problem #6  obesity Problem #7 history of liver cirrhosis  Plan: 1] We'll continue Demadex and metolazone. 2] we'll add albumin 25 mg IV twice a day for 2 days. If no response patient may end up requiring dialysis and I have discussed with him. 3] patient advised to decrease his salt and fluid intake 4] will check renal panel today and in the morning   LOS: 10 days   Carlynn Leduc S 03/16/2016,9:28 AM

## 2016-03-16 NOTE — Progress Notes (Signed)
PROGRESS NOTE    Jorda Cung  [id number] DOB: 06/11/1970 DOA: 03/06/2016 PCP: Deloria Lair, MD    Brief Narrative:  45 y.o. male with medical history significant of ESLD and CKD with morbid obesity.  He presents today for worsening LE edema and anuria. In the outpatient setting, patient was given an increased dose of diuretic with little to no improvement. Patient was found to have worsening renal function on presentation and was admitted for management of volume overload.  Assessment & Plan:   Principal Problem:   Decompensation of cirrhosis of liver (HCC) Active Problems:   Anemia   Anasarca   Thrombocytopenia (HCC)   Acute renal failure (HCC)   Lower extremity edema   Hyponatremia   Hyperglycemia   Protein-calorie malnutrition, severe (HCC)   CHF (congestive heart failure) (HCC)   Rectal bleeding   Hypokalemia   Elevated brain natriuretic peptide (BNP) level   ATN (acute tubular necrosis) (HCC)   Decompensated cirrhosis of liver/ETOH Cirrhosis -Function profile is improving. Recheck LFTs in a.m. -GI following. GI recommendations to continue nadolol 40 mg by mouth daily and 20 mg by mouth in evening. Goal heart rate 55.  ARF -Renal function a little worse today, up to 2.53. -Patient still edematous, urine output seems to have decreased/anuric over past 24 hours -Discussed case with nephrology. Plan to increase start back on IV lasix, place foley catheter. -May need dialysis if fails to diurese.  Hyponatremia -Sodium stable at around 130. -Suspect hyponatremia secondary to volume overload with decompensated cirrhosis of the liver.  Acutely decompensated chronic CHF -Nephrology following. -See above for details. -Still with marked volume overload on exam.  Suspected acute blood loss Anemia from suspected GI bleeding -Thus far during this admission, patient has received 1 unit PRBCs on 03/07/2016 and 1 unit on 03/09/2016. Received 2 units of PRBCs on 10/18  for hemoglobin of 6.6, hemoglobin is only up to 7.3 today. Still has been having some low-grade hematochezia with bowel movements, will need to monitor hemoglobin closely, GI is involved in his care. -Continue to follow CBC, transfuse as needed  Thrombocytopenia -Platelets stable -Repeat CBC in the morning  Malnutrition -Appreciate input by nutrition -For now, continue fluid restricted diet  Hypokalemia -Potassium 4.2. Replaced. -Have check magnesium today, normal at 2.0   DVT prophylaxis: SCDs Code Status: Full code Family Communication: Patient in room, family at bedside Disposition Plan: Uncertain at this time [facility] for [place] home in 2-3 days.  Consultants:   Gastroenterology  Nephrology  Procedures:     Antimicrobials: Anti-infectives    Start     Dose/Rate Route Frequency Ordered Stop   03/07/16 1400  cefTRIAXone (ROCEPHIN) 1 g in dextrose 5 % 50 mL IVPB    Comments:  Please dose adjust if needed for renal insufficency   1 g 100 mL/hr over 30 Minutes Intravenous Every 24 hours 03/07/16 1339 03/13/16 1441   03/06/16 1800  rifaximin (XIFAXAN) tablet 550 mg     550 mg Oral 2 times daily 03/06/16 1734        Subjective: Reports feeling better today. Swelling seems improved per patient  Objective: Vitals:   03/15/16 1433 03/15/16 2202 03/16/16 0504 03/16/16 1036  BP: (!) 101/44 (!) 97/48 (!) 97/41 (!) 101/47  Pulse: 67 62 63 66  Resp: 20 20 20 19   Temp: 98.4 F (36.9 C) 98.9 F (37.2 C) 98.1 F (36.7 C) 98.5 F (36.9 C)  TempSrc: Oral Oral Oral Oral  SpO2: 98% 100% 100% 99%  Weight:      Height:        Intake/Output Summary (Last 24 hours) at 03/16/16 1101 Last data filed at 03/16/16 0900  Gross per 24 hour  Intake             1680 ml  Output              800 ml  Net              880 ml   Filed Weights   03/10/16 0400 03/13/16 0551 03/13/16 1020  Weight: (!) 168.7 kg (371 lb 14.7 oz) (!) 168.4 kg (371 lb 4.1 oz) (!) 170.9 kg (376 lb 12.8 oz)     Examination:  General exam: Awake, lying in bed, no acute distress Respiratory system: Speaking full sentences, normal chest rise, no audible wheezing Cardiovascular system: Regular rate, S1-S2 Gastrointestinal system: Nondistended, positive bowel sounds Central nervous system: No seizures, no tremors Extremities: No cyanosis, perfused Skin: No notable skin lesions seen, no rashes Psychiatry: Normal affect, no visual hallucinations  Data Reviewed: I have personally reviewed following labs and imaging studies  CBC:  Recent Labs Lab 03/11/16 0448 03/12/16 0604 03/13/16 0512 03/14/16 0524 03/14/16 2247 03/16/16 0513  WBC 9.5 9.2 9.7 8.8  --  10.2  HGB 7.5* 7.2* 7.1* 6.6* 7.7* 7.3*  HCT 23.0* 22.5* 21.5* 21.3* 23.4* 22.9*  MCV 93.5 95.3 95.1 96.8  --  93.1  PLT 141* 130* 135* 125*  --  0000000*   Basic Metabolic Panel:  Recent Labs Lab 03/12/16 0604 03/13/16 0512 03/14/16 0524 03/15/16 0921 03/16/16 0513  NA 131* 132* 130* 129* 130*  K 3.3* 3.1* 3.1* 3.8 4.2  CL 100* 102 101 102 104  CO2 27 25 23 22  21*  GLUCOSE 90 88 129* 127* 88  BUN 48* 46* 46* 52* 61*  CREATININE 1.60* 1.44* 1.48* 2.00* 2.53*  CALCIUM 8.2* 8.3* 8.1* 8.2* 8.2*  MG  --  2.0  --   --   --   PHOS  --   --   --  5.2* 5.2*   GFR: Estimated Creatinine Clearance: 62.7 mL/min (by C-G formula based on SCr of 2.53 mg/dL (H)). Liver Function Tests:  Recent Labs Lab 03/11/16 0448 03/12/16 0604 03/13/16 0512 03/14/16 0524 03/15/16 0921 03/16/16 0513  AST 60* 57* 57* 59*  --  57*  ALT 20 20 19 18   --  19  ALKPHOS 197* 152* 151* 146*  --  135*  BILITOT 4.5* 5.1* 4.3* 4.2*  --  4.9*  PROT 7.0 7.0 6.8 6.7  --  6.8  ALBUMIN 2.1* 2.0* 2.0* 2.0* 2.0* 2.0*  2.0*   No results for input(s): LIPASE, AMYLASE in the last 168 hours. No results for input(s): AMMONIA in the last 168 hours. Coagulation Profile:  Recent Labs Lab 03/12/16 1642 03/14/16 0524  INR 2.25 2.34   Cardiac Enzymes: No results  for input(s): CKTOTAL, CKMB, CKMBINDEX, TROPONINI in the last 168 hours. BNP (last 3 results) No results for input(s): PROBNP in the last 8760 hours. HbA1C: No results for input(s): HGBA1C in the last 72 hours. CBG: No results for input(s): GLUCAP in the last 168 hours. Lipid Profile: No results for input(s): CHOL, HDL, LDLCALC, TRIG, CHOLHDL, LDLDIRECT in the last 72 hours. Thyroid Function Tests: No results for input(s): TSH, T4TOTAL, FREET4, T3FREE, THYROIDAB in the last 72 hours. Anemia Panel: No results for input(s): VITAMINB12, FOLATE, FERRITIN, TIBC, IRON, RETICCTPCT in the last 72 hours. Sepsis Labs:  No results for input(s): PROCALCITON, LATICACIDVEN in the last 168 hours.  Recent Results (from the past 240 hour(s))  MRSA PCR Screening     Status: None   Collection Time: 03/06/16  6:00 PM  Result Value Ref Range Status   MRSA by PCR NEGATIVE NEGATIVE Final    Comment:        The GeneXpert MRSA Assay (FDA approved for NASAL specimens only), is one component of a comprehensive MRSA colonization surveillance program. It is not intended to diagnose MRSA infection nor to guide or monitor treatment for MRSA infections.      Radiology Studies: No results found.  Scheduled Meds: . albumin human  25 g Intravenous BID  . docusate sodium  100 mg Oral BID  . feeding supplement (PRO-STAT SUGAR FREE 64)  45 mL Oral TID  . folic acid  1 mg Oral Daily  . furosemide  200 mg Intravenous BID  . lactulose  20 g Oral TID  . magnesium oxide  400 mg Oral BID  . metolazone  5 mg Oral BID  . nadolol  20 mg Oral QPC supper  . nadolol  40 mg Oral Daily  . pantoprazole  40 mg Oral BID AC  . potassium chloride  40 mEq Oral BID  . rifaximin  550 mg Oral BID  . sodium chloride flush  3 mL Intravenous Q12H  . thiamine  100 mg Oral Daily   Continuous Infusions:    LOS: 10 days   Lelon Frohlich, MD [facility] Pager [phone]  If 7PM-7AM, please contact  night-coverage [email or web address] Password [email or web address] 03/16/2016, 11:01 AM

## 2016-03-17 DIAGNOSIS — R601 Generalized edema: Secondary | ICD-10-CM

## 2016-03-17 DIAGNOSIS — K7290 Hepatic failure, unspecified without coma: Secondary | ICD-10-CM

## 2016-03-17 DIAGNOSIS — D696 Thrombocytopenia, unspecified: Secondary | ICD-10-CM

## 2016-03-17 LAB — RENAL FUNCTION PANEL
ALBUMIN: 2.3 g/dL — AB (ref 3.5–5.0)
ANION GAP: 8 (ref 5–15)
BUN: 66 mg/dL — AB (ref 6–20)
CHLORIDE: 105 mmol/L (ref 101–111)
CO2: 20 mmol/L — ABNORMAL LOW (ref 22–32)
Calcium: 8.6 mg/dL — ABNORMAL LOW (ref 8.9–10.3)
Creatinine, Ser: 2.7 mg/dL — ABNORMAL HIGH (ref 0.61–1.24)
GFR, EST AFRICAN AMERICAN: 32 mL/min — AB (ref >60)
GFR, EST NON AFRICAN AMERICAN: 27 mL/min — AB (ref >60)
Glucose, Bld: 87 mg/dL (ref 65–99)
PHOSPHORUS: 5.7 mg/dL — AB (ref 2.5–4.6)
POTASSIUM: 3.2 mmol/L — AB (ref 3.5–5.1)
Sodium: 133 mmol/L — ABNORMAL LOW (ref 135–145)

## 2016-03-17 MED ORDER — POTASSIUM CHLORIDE CRYS ER 20 MEQ PO TBCR
40.0000 meq | EXTENDED_RELEASE_TABLET | Freq: Three times a day (TID) | ORAL | Status: DC
  Administered 2016-03-17 (×2): 40 meq via ORAL
  Filled 2016-03-17 (×3): qty 2

## 2016-03-17 NOTE — Progress Notes (Signed)
Subjective: No complaints, no dyspnea, good appetite  Objective: Vital signs in last 24 hours: Temp:  [98.2 F (36.8 C)-98.5 F (36.9 C)] 98.4 F (36.9 C) (10/21 0700) Pulse Rate:  [64-73] 73 (10/21 0700) Resp:  [18-20] 19 (10/21 0700) BP: (101-117)/(44-60) 113/45 (10/21 0700) SpO2:  [99 %-100 %] 100 % (10/21 0700) Weight change:   Intake/Output from previous day: 10/20 0701 - 10/21 0700 In: 1020 [P.O.:680; IV Piggyback:340] Out: 3950 [Urine:3950] Intake/Output this shift: No intake/output data recorded.   EXAM: General appearance:  Alert, cooperative, in no apparent distress Resp:  Diminished breath sounds, but clear bilaterally Cardio:  RRR without murmur or rub GI: + BS, soft, nontender Extremities:  2+ edema bilaterally  Lab Results:  Recent Labs  03/14/16 2247 03/16/16 0513  WBC  --  10.2  HGB 7.7* 7.3*  HCT 23.4* 22.9*  PLT  --  109*   BMET:  Recent Labs  03/16/16 0513 03/17/16 0552  NA 130* 133*  K 4.2 3.2*  CL 104 105  CO2 21* 20*  GLUCOSE 88 87  BUN 61* 66*  CREATININE 2.53* 2.70*  CALCIUM 8.2* 8.6*  ALBUMIN 2.0*  2.0* 2.3*   No results for input(s): PTH in the last 72 hours. Iron Studies: No results for input(s): IRON, TIBC, TRANSFERRIN, FERRITIN in the last 72 hours.  Studies/Results: No results found.   Assessment: 1. Acute kidney injury superimposed on chronic - His creatinine is increasing, now at 2.7, likely with fluid removal, and he remains asymptomatic. 2. Anasarca - He is currently on Lasix 200 mg IV bid and Metolazone 5 mg bid.  He also started Albumin 25 mg IV bid x 2 days.  He is non-oliguric and is improving with a net I/O of -2930 ml.   3. Hypokalemia - Most likely from diuretics, his potassium has fallen to 3.2, but  KCl 40 mEq bid was started yesterday. 4. Hypervolemic hyponatremia - His sodium has improved to 133. 5. Anemia - Most likely from GI bleeding, his Hgb was down to 7.3 yesterday. 6. Obesity 7. History of liver  cirrhosis    Plan: Continue with present mgt Patient is advised to reduce his salt and fluid intake Renal panel tomorrow AM  LOS: 11 days   LYLES,CHARLES 03/17/2016,7:45 AM  1]Patient still with significant anasarca but with good urine out put with lasix. We will continue with present treatment for today and possibly change into  Oral diuretics. If no response to oral diuretics we will consider hemodialysis possibly on Monday. 2] We will increase Kcl to 40 meq po tid

## 2016-03-17 NOTE — Progress Notes (Signed)
  Subjective:  Patient feels better. He states he is putting out a lot of urine. He tells me he had almost 4 L of urine the last 24 hours. He denies nausea vomiting or abdominal pain. He states his preadolescent weight was 275 pounds. On this admission he weighed 376 pounds. He is still seen fresh blood with his bowel movements. His wife states this was moderate amount. He denies melena.  Objective: Blood pressure (!) 113/45, pulse 73, temperature 98.4 F (36.9 C), temperature source Oral, resp. rate 19, height 6\' 2"  (1.88 m), weight (!) 376 lb 12.8 oz (170.9 kg), SpO2 100 %. Patient is alert and in no acute distress. Asterixis absent. Abdomen is obese with multiple striae. Pitting edema noted in both flanks. Abdomen is soft and it is difficult to palpate liver or spleen. No masses noted. He has 4+ pitting edema involving both lower extremities.  Urine output 3950 mL last 24 hours.  Labs/studies Results:   Recent Labs  03/14/16 2247 03/16/16 0513  WBC  --  10.2  HGB 7.7* 7.3*  HCT 23.4* 22.9*  PLT  --  109*    BMET   Recent Labs  03/15/16 0921 03/16/16 0513 03/17/16 0552  NA 129* 130* 133*  K 3.8 4.2 3.2*  CL 102 104 105  CO2 22 21* 20*  GLUCOSE 127* 88 87  BUN 52* 61* 66*  CREATININE 2.00* 2.53* 2.70*  CALCIUM 8.2* 8.2* 8.6*    LFT   Recent Labs  03/15/16 0921 03/16/16 0513 03/17/16 0552  PROT  --  6.8  --   ALBUMIN 2.0* 2.0*  2.0* 2.3*  AST  --  57*  --   ALT  --  19  --   ALKPHOS  --  135*  --   BILITOT  --  4.9*  --   BILIDIR  --  2.0*  --   IBILI  --  2.9*  --      Assessment:  #1.Fluid overload/anasarca. Patient is responding to IV diuretic therapy and albumin which is being supervised by Dr.Befakadu. Significant diureses in the last 36 hours. #2. Acute on chronic kidney disease. Serum creatinine is slowly creeping up. #3. Anemia. Anemia is primarily due to chronic disease rectal bleeding may also be contribution to his anemia. Will continue to  monitor H&H closely. #4. Cirrhosis. Etiology felt to be alcoholic liver disease but he also must have known alcoholic steatohepatitis given morbid obesity.  Recommendations:  CBC with a.m. lab.

## 2016-03-17 NOTE — Progress Notes (Signed)
PROGRESS NOTE    Antonio [name]  [id number] DOB: 1970-12-04 DOA: 03/06/2016 PCP: Deloria Lair, MD    Brief Narrative:  45 y.o. male with medical history significant of ESLD and CKD with morbid obesity.  He presents today for worsening LE edema and anuria. In the outpatient setting, patient was given an increased dose of diuretic with little to no improvement. Patient was found to have worsening renal function on presentation and was admitted for management of volume overload.  Assessment & Plan:   Principal Problem:   Decompensation of cirrhosis of liver (HCC) Active Problems:   Anemia   Anasarca   Thrombocytopenia (HCC)   Acute renal failure (HCC)   Lower extremity edema   Hyponatremia   Hyperglycemia   Protein-calorie malnutrition, severe (HCC)   CHF (congestive heart failure) (HCC)   Rectal bleeding   Hypokalemia   Elevated brain natriuretic peptide (BNP) level   ATN (acute tubular necrosis) (HCC)   Decompensated cirrhosis of liver/ETOH Cirrhosis -Function profile has stabilized. Recheck LFTs in a.m. -GI following. GI recommendations to continue nadolol 40 mg by mouth daily and 20 mg by mouth in evening. Goal heart rate 55.  ARF -Renal function a little worse today, up to 2.70, not surprising with increased diuretic dose. -Discussed case with nephrology. Plan to continue high dose IV lasix and metolazone today with plans to transition to PO diuretics in 24 hours. If after that, UOP drops off, will need to consider initiation of dialysis.  Hyponatremia -Sodium stable at around 133. -Suspect hyponatremia secondary to volume overload with decompensated cirrhosis of the liver.  Acutely decompensated chronic CHF -Nephrology following. -See above for details. -Still with marked volume overload on exam.  Suspected acute blood loss Anemia from suspected GI bleeding -Thus far during this admission, patient has received 1 unit PRBCs on 03/07/2016 and 1 unit on  03/09/2016. Received 2 units of PRBCs on 10/18 for hemoglobin of 6.6, hemoglobin is stable at 7.3 today. Still has been having some low-grade hematochezia with bowel movements, will need to monitor hemoglobin closely, GI is involved in his care. -Continue to follow CBC, transfuse as needed  Thrombocytopenia -Platelets stable -Repeat CBC in the morning  Malnutrition -Appreciate input by nutrition -For now, continue fluid restricted diet  Hypokalemia -Potassium 3.2. Replaced. -Magnesium normal at 2.0   DVT prophylaxis: SCDs Code Status: Full code Family Communication: Patient in room, family at bedside Disposition Plan: Uncertain at this time [facility] for [place] home in 2-3 days.  Consultants:   Gastroenterology  Nephrology  Procedures:     Antimicrobials: Anti-infectives    Start     Dose/Rate Route Frequency Ordered Stop   03/07/16 1400  cefTRIAXone (ROCEPHIN) 1 g in dextrose 5 % 50 mL IVPB    Comments:  Please dose adjust if needed for renal insufficency   1 g 100 mL/hr over 30 Minutes Intravenous Every 24 hours 03/07/16 1339 03/13/16 1441   03/06/16 1800  rifaximin (XIFAXAN) tablet 550 mg     550 mg Oral 2 times daily 03/06/16 1734        Subjective: Reports feeling better today. Swelling seems improved per patient  Objective: Vitals:   03/16/16 1243 03/16/16 1719 03/16/16 2100 03/17/16 0700  BP: (!) 106/44 (!) 117/59 115/60 (!) 113/45  Pulse: 66 64 65 73  Resp: 18 20 18 19   Temp: 98.2 F (36.8 C)  98.4 F (36.9 C) 98.4 F (36.9 C)  TempSrc: Oral  Oral Oral  SpO2: 100% 100% 100% 100%  Weight:      Height:        Intake/Output Summary (Last 24 hours) at 03/17/16 1015 Last data filed at 03/17/16 0953  Gross per 24 hour  Intake              780 ml  Output             5150 ml  Net            -4370 ml   Filed Weights   03/10/16 0400 03/13/16 0551 03/13/16 1020  Weight: (!) 168.7 kg (371 lb 14.7 oz) (!) 168.4 kg (371 lb 4.1 oz) (!) 170.9 kg (376 lb 12.8  oz)    Examination:  General exam: Awake, lying in bed, no acute distress Respiratory system: Speaking full sentences, normal chest rise, no audible wheezing Cardiovascular system: Regular rate, S1-S2 Gastrointestinal system: Nondistended, positive bowel sounds Central nervous system: No seizures, no tremors Extremities: No cyanosis, perfused Skin: No notable skin lesions seen, no rashes Psychiatry: Normal affect, no visual hallucinations  Data Reviewed: I have personally reviewed following labs and imaging studies  CBC:  Recent Labs Lab 03/11/16 0448 03/12/16 0604 03/13/16 0512 03/14/16 0524 03/14/16 2247 03/16/16 0513  WBC 9.5 9.2 9.7 8.8  --  10.2  HGB 7.5* 7.2* 7.1* 6.6* 7.7* 7.3*  HCT 23.0* 22.5* 21.5* 21.3* 23.4* 22.9*  MCV 93.5 95.3 95.1 96.8  --  93.1  PLT 141* 130* 135* 125*  --  0000000*   Basic Metabolic Panel:  Recent Labs Lab 03/13/16 0512 03/14/16 0524 03/15/16 0921 03/16/16 0513 03/17/16 0552  NA 132* 130* 129* 130* 133*  K 3.1* 3.1* 3.8 4.2 3.2*  CL 102 101 102 104 105  CO2 25 23 22  21* 20*  GLUCOSE 88 129* 127* 88 87  BUN 46* 46* 52* 61* 66*  CREATININE 1.44* 1.48* 2.00* 2.53* 2.70*  CALCIUM 8.3* 8.1* 8.2* 8.2* 8.6*  MG 2.0  --   --   --   --   PHOS  --   --  5.2* 5.2* 5.7*   GFR: Estimated Creatinine Clearance: 58.7 mL/min (by C-G formula based on SCr of 2.7 mg/dL (H)). Liver Function Tests:  Recent Labs Lab 03/11/16 0448 03/12/16 0604 03/13/16 0512 03/14/16 0524 03/15/16 0921 03/16/16 0513 03/17/16 0552  AST 60* 57* 57* 59*  --  57*  --   ALT 20 20 19 18   --  19  --   ALKPHOS 197* 152* 151* 146*  --  135*  --   BILITOT 4.5* 5.1* 4.3* 4.2*  --  4.9*  --   PROT 7.0 7.0 6.8 6.7  --  6.8  --   ALBUMIN 2.1* 2.0* 2.0* 2.0* 2.0* 2.0*  2.0* 2.3*   No results for input(s): LIPASE, AMYLASE in the last 168 hours. No results for input(s): AMMONIA in the last 168 hours. Coagulation Profile:  Recent Labs Lab 03/12/16 1642 03/14/16 0524    INR 2.25 2.34   Cardiac Enzymes: No results for input(s): CKTOTAL, CKMB, CKMBINDEX, TROPONINI in the last 168 hours. BNP (last 3 results) No results for input(s): PROBNP in the last 8760 hours. HbA1C: No results for input(s): HGBA1C in the last 72 hours. CBG: No results for input(s): GLUCAP in the last 168 hours. Lipid Profile: No results for input(s): CHOL, HDL, LDLCALC, TRIG, CHOLHDL, LDLDIRECT in the last 72 hours. Thyroid Function Tests: No results for input(s): TSH, T4TOTAL, FREET4, T3FREE, THYROIDAB in the last 72 hours. Anemia Panel: No results  for input(s): VITAMINB12, FOLATE, FERRITIN, TIBC, IRON, RETICCTPCT in the last 72 hours. Sepsis Labs: No results for input(s): PROCALCITON, LATICACIDVEN in the last 168 hours.  No results found for this or any previous visit (from the past 240 hour(s)).   Radiology Studies: No results found.  Scheduled Meds: . albumin human  25 g Intravenous BID  . docusate sodium  100 mg Oral BID  . feeding supplement (PRO-STAT SUGAR FREE 64)  45 mL Oral TID  . folic acid  1 mg Oral Daily  . furosemide  200 mg Intravenous BID  . lactulose  20 g Oral TID  . magnesium oxide  400 mg Oral BID  . metolazone  5 mg Oral BID  . nadolol  20 mg Oral QPC supper  . nadolol  40 mg Oral Daily  . pantoprazole  40 mg Oral BID AC  . potassium chloride  40 mEq Oral TID  . rifaximin  550 mg Oral BID  . sodium chloride flush  3 mL Intravenous Q12H  . thiamine  100 mg Oral Daily   Continuous Infusions:    LOS: 11 days   Lelon Frohlich, MD [facility] Pager [phone]  If 7PM-7AM, please contact night-coverage [email or web address] Password [email or web address] 03/17/2016, 10:15 AM

## 2016-03-18 LAB — COMPREHENSIVE METABOLIC PANEL
ALT: 19 U/L (ref 17–63)
ANION GAP: 5 (ref 5–15)
AST: 56 U/L — ABNORMAL HIGH (ref 15–41)
Albumin: 2.5 g/dL — ABNORMAL LOW (ref 3.5–5.0)
Alkaline Phosphatase: 127 U/L — ABNORMAL HIGH (ref 38–126)
BUN: 65 mg/dL — ABNORMAL HIGH (ref 6–20)
CHLORIDE: 106 mmol/L (ref 101–111)
CO2: 25 mmol/L (ref 22–32)
CREATININE: 2.02 mg/dL — AB (ref 0.61–1.24)
Calcium: 8.7 mg/dL — ABNORMAL LOW (ref 8.9–10.3)
GFR, EST AFRICAN AMERICAN: 45 mL/min — AB (ref >60)
GFR, EST NON AFRICAN AMERICAN: 39 mL/min — AB (ref >60)
Glucose, Bld: 89 mg/dL (ref 65–99)
POTASSIUM: 2.7 mmol/L — AB (ref 3.5–5.1)
SODIUM: 136 mmol/L (ref 135–145)
Total Bilirubin: 3.7 mg/dL — ABNORMAL HIGH (ref 0.3–1.2)
Total Protein: 6.8 g/dL (ref 6.5–8.1)

## 2016-03-18 LAB — CBC
HCT: 21.5 % — ABNORMAL LOW (ref 39.0–52.0)
HEMOGLOBIN: 7 g/dL — AB (ref 13.0–17.0)
MCH: 30.2 pg (ref 26.0–34.0)
MCHC: 32.6 g/dL (ref 30.0–36.0)
MCV: 92.7 fL (ref 78.0–100.0)
PLATELETS: 104 10*3/uL — AB (ref 150–400)
RBC: 2.32 MIL/uL — AB (ref 4.22–5.81)
RDW: 20 % — ABNORMAL HIGH (ref 11.5–15.5)
WBC: 7.9 10*3/uL (ref 4.0–10.5)

## 2016-03-18 LAB — MAGNESIUM: Magnesium: 1.9 mg/dL (ref 1.7–2.4)

## 2016-03-18 MED ORDER — TORSEMIDE 20 MG PO TABS
100.0000 mg | ORAL_TABLET | Freq: Every day | ORAL | Status: DC
  Administered 2016-03-18 – 2016-03-19 (×2): 100 mg via ORAL
  Filled 2016-03-18 (×2): qty 5

## 2016-03-18 MED ORDER — POTASSIUM CHLORIDE 10 MEQ/100ML IV SOLN
10.0000 meq | INTRAVENOUS | Status: AC
Start: 2016-03-18 — End: 2016-03-18
  Administered 2016-03-18 (×3): 10 meq via INTRAVENOUS

## 2016-03-18 MED ORDER — POTASSIUM CHLORIDE 10 MEQ/100ML IV SOLN
INTRAVENOUS | Status: AC
  Filled 2016-03-18: qty 100

## 2016-03-18 MED ORDER — POTASSIUM CHLORIDE CRYS ER 20 MEQ PO TBCR
40.0000 meq | EXTENDED_RELEASE_TABLET | Freq: Four times a day (QID) | ORAL | Status: DC
  Administered 2016-03-18 – 2016-03-19 (×4): 40 meq via ORAL
  Filled 2016-03-18 (×4): qty 2

## 2016-03-18 MED ORDER — POTASSIUM CHLORIDE CRYS ER 20 MEQ PO TBCR: 40.0000 meq | EXTENDED_RELEASE_TABLET | ORAL | Status: DC

## 2016-03-18 NOTE — Progress Notes (Signed)
PROGRESS NOTE    Amara Billey  [id number] DOB: Feb 24, 1971 DOA: 03/06/2016 PCP: Deloria Lair, MD    Brief Narrative:  45 y.o. male with medical history significant of ESLD and CKD with morbid obesity.  He presents today for worsening LE edema and anuria. In the outpatient setting, patient was given an increased dose of diuretic with little to no improvement. Patient was found to have worsening renal function on presentation and was admitted for management of volume overload.  Assessment & Plan:   Principal Problem:   Decompensation of cirrhosis of liver (HCC) Active Problems:   Anemia   Anasarca   Thrombocytopenia (HCC)   Acute renal failure (HCC)   Lower extremity edema   Hyponatremia   Hyperglycemia   Protein-calorie malnutrition, severe (HCC)   CHF (congestive heart failure) (HCC)   Rectal bleeding   Hypokalemia   Elevated brain natriuretic peptide (BNP) level   ATN (acute tubular necrosis) (HCC)   Decompensated cirrhosis of liver/ETOH Cirrhosis -Function profile has stabilized. Recheck LFTs in a.m. -GI following. GI recommendations to continue nadolol 40 mg by mouth daily and 20 mg by mouth in evening. Goal heart rate 55.  ARF -Renal function improved at 2.20. -UOP 8900 ml past 24 hours. -Nephrology to determine whether will switch to PO diuretics today or keep on IV.  Hyponatremia -Sodium stable at around 136. -Suspect hyponatremia secondary to volume overload with decompensated cirrhosis of the liver.  Acutely decompensated chronic CHF -Nephrology following. -See above for details. -Still with marked volume overload on exam.  Suspected acute blood loss Anemia from suspected GI bleeding -Thus far during this admission, patient has received 1 unit PRBCs on 03/07/2016 and 1 unit on 03/09/2016. Received 2 units of PRBCs on 10/18 for hemoglobin of 6.6, hemoglobin is 7.0 today. Still has been having some low-grade hematochezia with bowel movements, will need  to monitor hemoglobin closely, GI is involved in his care. -Continue to follow CBC, transfuse as needed for Hb <7.  Thrombocytopenia -Platelets stable -Repeat CBC in the morning  Malnutrition -Appreciate input by nutrition -For now, continue fluid restricted diet  Hypokalemia -Due to diuresis, will replace. -Magnesium normal at 2.0   DVT prophylaxis: SCDs Code Status: Full code Family Communication: Patient in room, family at bedside Disposition Plan: Uncertain at this time [facility] for [place] home in 2-3 days.  Consultants:   Gastroenterology  Nephrology  Procedures:     Antimicrobials: Anti-infectives    Start     Dose/Rate Route Frequency Ordered Stop   03/07/16 1400  cefTRIAXone (ROCEPHIN) 1 g in dextrose 5 % 50 mL IVPB    Comments:  Please dose adjust if needed for renal insufficency   1 g 100 mL/hr over 30 Minutes Intravenous Every 24 hours 03/07/16 1339 03/13/16 1441   03/06/16 1800  rifaximin (XIFAXAN) tablet 550 mg     550 mg Oral 2 times daily 03/06/16 1734        Subjective: Reports feeling better today. Swelling seems improved per patient  Objective: Vitals:   03/17/16 0700 03/17/16 1353 03/17/16 2108 03/18/16 0328  BP: (!) 113/45 (!) 105/51 (!) 103/54 (!) 106/51  Pulse: 73 92 (!) 112 95  Resp: 19 18 19 18   Temp: 98.4 F (36.9 C) 98.3 F (36.8 C) 98.2 F (36.8 C) 98.2 F (36.8 C)  TempSrc: Oral Oral Oral Oral  SpO2: 100% 100% 100% 100%  Weight:      Height:        Intake/Output Summary (Last 24  hours) at 03/18/16 1140 Last data filed at 03/18/16 1125  Gross per 24 hour  Intake              750 ml  Output            11800 ml  Net           -11050 ml   Filed Weights   03/10/16 0400 03/13/16 0551 03/13/16 1020  Weight: (!) 168.7 kg (371 lb 14.7 oz) (!) 168.4 kg (371 lb 4.1 oz) (!) 170.9 kg (376 lb 12.8 oz)    Examination:  General exam: Awake, lying in bed, no acute distress Respiratory system: Speaking full sentences, normal chest  rise, no audible wheezing Cardiovascular system: Regular rate, S1-S2 Gastrointestinal system: Nondistended, positive bowel sounds Central nervous system: No seizures, no tremors Extremities: No cyanosis, perfused Skin: No notable skin lesions seen, no rashes Psychiatry: Normal affect, no visual hallucinations  Data Reviewed: I have personally reviewed following labs and imaging studies  CBC:  Recent Labs Lab 03/12/16 0604 03/13/16 0512 03/14/16 0524 03/14/16 2247 03/16/16 0513 03/18/16 0601  WBC 9.2 9.7 8.8  --  10.2 7.9  HGB 7.2* 7.1* 6.6* 7.7* 7.3* 7.0*  HCT 22.5* 21.5* 21.3* 23.4* 22.9* 21.5*  MCV 95.3 95.1 96.8  --  93.1 92.7  PLT 130* 135* 125*  --  109* [id number]*   Basic Metabolic Panel:  Recent Labs Lab 03/13/16 0512 03/14/16 0524 03/15/16 0921 03/16/16 0513 03/17/16 0552 03/18/16 0601  NA 132* 130* 129* 130* 133* 136  K 3.1* 3.1* 3.8 4.2 3.2* 2.7*  CL 102 101 102 104 105 106  CO2 25 23 22  21* 20* 25  GLUCOSE 88 129* 127* 88 87 89  BUN 46* 46* 52* 61* 66* 65*  CREATININE 1.44* 1.48* 2.00* 2.53* 2.70* 2.02*  CALCIUM 8.3* 8.1* 8.2* 8.2* 8.6* 8.7*  MG 2.0  --   --   --   --  1.9  PHOS  --   --  5.2* 5.2* 5.7*  --    GFR: Estimated Creatinine Clearance: 78.5 mL/min (by C-G formula based on SCr of 2.02 mg/dL (H)). Liver Function Tests:  Recent Labs Lab 03/12/16 0604 03/13/16 0512 03/14/16 0524 03/15/16 0921 03/16/16 0513 03/17/16 0552 03/18/16 0601  AST 57* 57* 59*  --  57*  --  56*  ALT 20 19 18   --  19  --  19  ALKPHOS 152* 151* 146*  --  135*  --  127*  BILITOT 5.1* 4.3* 4.2*  --  4.9*  --  3.7*  PROT 7.0 6.8 6.7  --  6.8  --  6.8  ALBUMIN 2.0* 2.0* 2.0* 2.0* 2.0*  2.0* 2.3* 2.5*   No results for input(s): LIPASE, AMYLASE in the last 168 hours. No results for input(s): AMMONIA in the last 168 hours. Coagulation Profile:  Recent Labs Lab 03/12/16 1642 03/14/16 0524  INR 2.25 2.34   Cardiac Enzymes: No results for input(s): CKTOTAL, CKMB,  CKMBINDEX, TROPONINI in the last 168 hours. BNP (last 3 results) No results for input(s): PROBNP in the last 8760 hours. HbA1C: No results for input(s): HGBA1C in the last 72 hours. CBG: No results for input(s): GLUCAP in the last 168 hours. Lipid Profile: No results for input(s): CHOL, HDL, LDLCALC, TRIG, CHOLHDL, LDLDIRECT in the last 72 hours. Thyroid Function Tests: No results for input(s): TSH, T4TOTAL, FREET4, T3FREE, THYROIDAB in the last 72 hours. Anemia Panel: No results for input(s): VITAMINB12, FOLATE, FERRITIN, TIBC, IRON,  RETICCTPCT in the last 72 hours. Sepsis Labs: No results for input(s): PROCALCITON, LATICACIDVEN in the last 168 hours.  No results found for this or any previous visit (from the past 240 hour(s)).   Radiology Studies: No results found.  Scheduled Meds: . docusate sodium  100 mg Oral BID  . feeding supplement (PRO-STAT SUGAR FREE 64)  45 mL Oral TID  . folic acid  1 mg Oral Daily  . furosemide  200 mg Intravenous BID  . lactulose  20 g Oral TID  . magnesium oxide  400 mg Oral BID  . metolazone  5 mg Oral BID  . nadolol  20 mg Oral QPC supper  . nadolol  40 mg Oral Daily  . pantoprazole  40 mg Oral BID AC  . potassium chloride  40 mEq Oral QID  . rifaximin  550 mg Oral BID  . sodium chloride flush  3 mL Intravenous Q12H  . thiamine  100 mg Oral Daily   Continuous Infusions:    LOS: 12 days   Lelon Frohlich, MD [facility] Pager [phone]  If 7PM-7AM, please contact night-coverage [email or web address] Password [email or web address] 03/18/2016, 11:40 AM

## 2016-03-18 NOTE — Progress Notes (Signed)
  Subjective:  Patient reports less edema to his legs. He states he don't feel as tight. He remains with good appetite. He states he had 3 or 4 bowel movements yesterday. He noted blood each time. It is fresh. His stools are loose because he is on lactulose.   Objective: Blood pressure (!) 106/51, pulse 95, temperature 98.2 F (36.8 C), temperature source Oral, resp. rate 18, height 6\' 2"  (1.88 m), weight (!) 376 lb 12.8 oz (170.9 kg), SpO2 100 %. Weight on bed scale is 365.3 pounds. Patient is alert and does not have asterixis. Abdomen is obese with significant pitting edema involving both flanks. He remains with 4+ pitting edema to both lower extremities but on palpation is not as tight as per yesterday.  Urine output 8900 mL last 24 hours.  Labs/studies Results:   Recent Labs  03/16/16 0513 03/18/16 0601  WBC 10.2 7.9  HGB 7.3* 7.0*  HCT 22.9* 21.5*  PLT 109* 104*    BMET   Recent Labs  03/16/16 0513 03/17/16 0552 03/18/16 0601  NA 130* 133* 136  K 4.2 3.2* 2.7*  CL 104 105 106  CO2 21* 20* 25  GLUCOSE 88 87 89  BUN 61* 66* 65*  CREATININE 2.53* 2.70* 2.02*  CALCIUM 8.2* 8.6* 8.7*    LFT   Recent Labs  03/16/16 0513 03/17/16 0552 03/18/16 0601  PROT 6.8  --  6.8  ALBUMIN 2.0*  2.0* 2.3* 2.5*  AST 57*  --  56*  ALT 19  --  19  ALKPHOS 135*  --  127*  BILITOT 4.9*  --  3.7*  BILIDIR 2.0*  --   --   IBILI 2.9*  --   --      Assessment:  #1. Fluid overload/anasarca. Patient is having brisk diureses. He has long way to go. #2. Kidney disease. Serum creatinine has improved the last 24 hours which is reassuring. #3. Hypokalemia secondary to aggressive diureses. He is getting by mouth potassium. #4. Rectal bleeding. Rectal bleeding possibly due to rectal varices or hemorrhoids. He had colonoscopy 10 days ago and there was no active bleeding. If he is bleeding from rectal varices these may have to be banded. #5. Anemia secondary to GI blood loss and  chronic illness. #6. 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  Recommendations:  Anusol HC suppository 1 per rectum daily at bedtime. Will discuss with Dr.Fields regarding rectal variceal banding. CBC in a.m.

## 2016-03-18 NOTE — Progress Notes (Signed)
Subjective:  Feeling much better, breathing well, good appetite  Objective: Vital signs in last 24 hours: Temp:  [98.2 F (36.8 C)-98.3 F (36.8 C)] 98.2 F (36.8 C) (10/22 0328) Pulse Rate:  [92-112] 95 (10/22 0328) Resp:  [18-19] 18 (10/22 0328) BP: (103-106)/(51-54) 106/51 (10/22 0328) SpO2:  [100 %] 100 % (10/22 0328) Weight change:   Intake/Output from previous day: 10/21 0701 - 10/22 0700 In: 880 [P.O.:640; IV Piggyback:240] Out: 8900 [Urine:8900] Intake/Output this shift: Total I/O In: -  Out: 2000 [Urine:2000]   EXAM: General appearance:  Alert, cooperative, in no apparent distress Resp:  Diminished breath sounds, but clear bilaterally Cardio:  RRR without murmur or rub GI: + BS, soft, nontender Extremities:  2+ edema bilaterally  Lab Results:  Recent Labs  03/16/16 0513 03/18/16 0601  WBC 10.2 7.9  HGB 7.3* 7.0*  HCT 22.9* 21.5*  PLT 109* 104*   BMET:  Recent Labs  03/17/16 0552 03/18/16 0601  NA 133* 136  K 3.2* 2.7*  CL 105 106  CO2 20* 25  GLUCOSE 87 89  BUN 66* 65*  CREATININE 2.70* 2.02*  CALCIUM 8.6* 8.7*  ALBUMIN 2.3* 2.5*   No results for input(s): PTH in the last 72 hours. Iron Studies: No results for input(s): IRON, TIBC, TRANSFERRIN, FERRITIN in the last 72 hours.  Studies/Results: No results found.   Assessment: 1. Acute kidney injury superimposed on chronic - His creatinine is now improving, down to 2.02, and he remains asymptomatic. 2. Anasarca - He is currently on Lasix 200 mg IV bid and Metolazone 5 mg bid.  He also received Albumin 25 mg IV bid x 2 days.  He is non-oliguric and is improving with a net I/O of -8 L yesterday, but will need to have good results on oral diuretics.   3. Hypokalemia - Secondary to diuretics and fluid loss, his potassium has fallen to 2.7, although he is receiving KCl 40 mEq tid. 4. Hypervolemic hyponatremia - His sodium has improved to 136. 5. Anemia - Most likely from GI bleeding, his Hgb is down  to 7 today.  He is followed by Dr. Laural Golden. 6. Obesity 7. History of liver cirrhosis   Plan:  KCl 10 mEq in 100 cc of NS IV over 1 hr x 3            KCl 40 mEq PO qid            Renal panel & CBC tomorrow AM            D/C lasix iv            Demadex 100 mg po once a day   LOS: 12 days   Antonio [name],Antonio [name] 03/18/2016,8:06 AM

## 2016-03-19 LAB — RENAL FUNCTION PANEL
ALBUMIN: 2.3 g/dL — AB (ref 3.5–5.0)
ANION GAP: 6 (ref 5–15)
BUN: 60 mg/dL — ABNORMAL HIGH (ref 6–20)
CALCIUM: 8.7 mg/dL — AB (ref 8.9–10.3)
CO2: 29 mmol/L (ref 22–32)
Chloride: 101 mmol/L (ref 101–111)
Creatinine, Ser: 1.54 mg/dL — ABNORMAL HIGH (ref 0.61–1.24)
GFR calc non Af Amer: 54 mL/min — ABNORMAL LOW (ref >60)
GLUCOSE: 98 mg/dL (ref 65–99)
PHOSPHORUS: 4.3 mg/dL (ref 2.5–4.6)
POTASSIUM: 2.5 mmol/L — AB (ref 3.5–5.1)
SODIUM: 136 mmol/L (ref 135–145)

## 2016-03-19 LAB — CBC
HEMATOCRIT: 22.4 % — AB (ref 39.0–52.0)
HEMOGLOBIN: 7.3 g/dL — AB (ref 13.0–17.0)
MCH: 30.3 pg (ref 26.0–34.0)
MCHC: 32.6 g/dL (ref 30.0–36.0)
MCV: 92.9 fL (ref 78.0–100.0)
Platelets: 109 10*3/uL — ABNORMAL LOW (ref 150–400)
RBC: 2.41 MIL/uL — AB (ref 4.22–5.81)
RDW: 20.1 % — ABNORMAL HIGH (ref 11.5–15.5)
WBC: 7.9 10*3/uL (ref 4.0–10.5)

## 2016-03-19 MED ORDER — POTASSIUM CHLORIDE CRYS ER 20 MEQ PO TBCR: 80.0000 meq | EXTENDED_RELEASE_TABLET | ORAL | Status: DC

## 2016-03-19 MED ORDER — POTASSIUM CHLORIDE CRYS ER 20 MEQ PO TBCR
40.0000 meq | EXTENDED_RELEASE_TABLET | ORAL | Status: AC
  Administered 2016-03-19 (×3): 40 meq via ORAL
  Filled 2016-03-19 (×2): qty 2

## 2016-03-19 NOTE — Progress Notes (Signed)
Antonio [name]  MRN: [id number]  DOB/AGE: 02-Sep-1970 45 y.o.  Primary Care Physician:TAPPER,DAVID B, MD  Admit date: 03/06/2016  Chief Complaint:  Chief Complaint  Patient presents with  . Leg Swelling    S-Pt presented on  03/06/2016 with  Chief Complaint  Patient presents with  . Leg Swelling  .    Pt  Offers no new complaints. Pt says " my swelling is better"   Meds . feeding supplement (PRO-STAT SUGAR FREE 64)  45 mL Oral TID  . folic acid  1 mg Oral Daily  . lactulose  20 g Oral TID  . magnesium oxide  400 mg Oral BID  . metolazone  5 mg Oral BID  . nadolol  20 mg Oral QPC supper  . nadolol  40 mg Oral Daily  . pantoprazole  40 mg Oral BID AC  . potassium chloride  40 mEq Oral QID  . rifaximin  550 mg Oral BID  . sodium chloride flush  3 mL Intravenous Q12H  . thiamine  100 mg Oral Daily  . torsemide  100 mg Oral Daily      Physical Exam: Vital signs in last 24 hours: Temp:  [98.5 F (36.9 C)-98.7 F (37.1 C)] 98.5 F (36.9 C) (10/23 0551) Pulse Rate:  [90-118] 118 (10/23 0551) Resp:  [18] 18 (10/23 0551) BP: (93-106)/(49-62) 93/49 (10/23 0551) SpO2:  [99 %-100 %] 99 % (10/23 0551) Weight change:  Last BM Date: 03/18/16  Intake/Output from previous day: 10/22 0701 - 10/23 0700 In: 653 [P.O.:480; I.V.:103; IV Piggyback:70] Out: 16200 [Urine:16200] Total I/O In: 3 [I.V.:3] Out: 950 [Urine:950]   Physical Exam: General- pt is awake,alert, follows commands Resp- No acute REsp distress,decreased bs at bases. CVS- S1S2 regular in rate and rhythm GIT- BS+, soft, distended, morbidly obese EXT- 1+ LE Edema, No Cyanosis.   Lab Results: CBC  Recent Labs  03/18/16 0601 03/19/16 0617  WBC 7.9 7.9  HGB 7.0* 7.3*  HCT 21.5* 22.4*  PLT 104* 109*    BMET  Recent Labs  03/18/16 0601 03/19/16 0617  NA 136 136  K 2.7* 2.5*  CL 106 101  CO2 25 29  GLUCOSE 89 98  BUN 65* 60*  CREATININE 2.02* 1.54*  CALCIUM 8.7* 8.7*   Creat trend 2017   3.58==>1.44==> 2.7==> 2.0=>1.54 2017  1.2--2.0( august admission)     MICRO No results found for this or any previous visit (from the past 240 hour(s)).    Lab Results  Component Value Date   CALCIUM 8.7 (L) 03/19/2016   PHOS 4.3 03/19/2016         Impression: 1)Renal  AKI secondary to Prerenal/ATN                AKI possibly Hepatorenal                Data against hepatorenal                Urine Na more than 10                Not Oliguric                Another episode of AKI during inpt stay                   AKI now improving  2)HTN  BP stable  Medication- On Diuretics-  3)Anemia HGb  Low.  Sec to GI bleeding received PRBC GI and  Primary team  following  4)Anasarca- on Diuretics    40 liters negative since admission  5)Thrombocytopenia Sec to Liver related issues Primary MD follwoing following  6)Electrolytes  Hypokalemic      Being replete  Hyponatremic     Hypervolemia hyponatremia     Now much better  7)Acid base Co2 at goal     Plan:  Will continue current care Will follow bmet        Daison Braxton S 03/19/2016, 10:52 AM

## 2016-03-19 NOTE — Progress Notes (Signed)
    Subjective: Rectal bleeding tapered off but still small amount. 3 loose BMs per patient. On Colace and lactulose. No abdominal pain. Tolerating diet. Denies chest pain, shortness of breath. Feels like heart is beating a little faster than normal. States he can bend his legs, where before he could not. Feels much better.   Objective: Vital signs in last 24 hours: Temp:  [98.5 F (36.9 C)-98.7 F (37.1 C)] 98.5 F (36.9 C) (10/23 0551) Pulse Rate:  [90-118] 118 (10/23 0551) Resp:  [18] 18 (10/23 0551) BP: (93-106)/(49-62) 93/49 (10/23 0551) SpO2:  [99 %-100 %] 99 % (10/23 0551) Last BM Date: 03/18/16 General:   Alert and oriented, pleasant Head:  Normocephalic and atraumatic. Heart: irregularly irregular, tachycardic  Abdomen:  Bowel sounds present, soft, non-tender, non-distended. Obese, edema in bilateral flanks but improved Extremities:  Pitting edema to mid-calf but improving. Pedal/ankle edema improving  Neurologic:  Alert and  oriented x4;  grossly normal neurologically. Psych:  Alert and cooperative. Normal mood and affect.  Intake/Output from previous day: 10/22 0701 - 10/23 0700 In: 653 [P.O.:480; I.V.:103; IV Piggyback:70] Out: 16200 [Urine:16200] Intake/Output this shift: Total I/O In: -  Out: 950 [Urine:950]  Lab Results:  Recent Labs  03/18/16 0601 03/19/16 0617  WBC 7.9 7.9  HGB 7.0* 7.3*  HCT 21.5* 22.4*  PLT 104* 109*   BMET  Recent Labs  03/17/16 0552 03/18/16 0601 03/19/16 0617  NA 133* 136 136  K 3.2* 2.7* 2.5*  CL 105 106 101  CO2 20* 25 29  GLUCOSE 87 89 98  BUN 66* 65* 60*  CREATININE 2.70* 2.02* 1.54*  CALCIUM 8.6* 8.7* 8.7*   LFT  Recent Labs  03/17/16 0552 03/18/16 0601 03/19/16 0617  PROT  --  6.8  --   ALBUMIN 2.3* 2.5* 2.3*  AST  --  56*  --   ALT  --  19  --   ALKPHOS  --  127*  --   BILITOT  --  3.7*  --     Assessment: 45 year old male with decompensated ETOH cirrhosis, presenting with anasarca, anuria,  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 MELD-Na 29 on 03/12/16.  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 Immediately after colonoscopy had large amount of fresh blood, which tapered off, self-limiting. Has had persistent, intermittent low-volume hematochezia in setting of coagulopathy. Improving per patient. Hgb this morning stable, improved from yesterday. Total of 4 units PRBCs (last on 10/18). No significant improvement in INR with Vit K.    Anasarca: improving albeit slowly. Creatinine improving. Nephrology input appreciated greatly. Repletion of electrolytes per Nephrology/attending.   Tachycardia: ?query new-onset afib.  Patient asymptomatic. Heart rate ranging in mid 90s to 1teens. With known varices, he has been on Nadolol for variceal bleeding prophylaxis. EKG ordered for verification. Discussed with attending.   Plan: EKG ordered Discontinue colace, continue Lactulose and xifaxan  Continue diuresis per Nephrology Follow H/H HFP, INR in am   Annitta Needs, ANP-BC [facility]     LOS: 13 days    03/19/2016, 8:39 AM

## 2016-03-19 NOTE — Progress Notes (Signed)
PROGRESS NOTE    Antonio [name]  [id number] DOB: June 26, 1970 DOA: 03/06/2016 PCP: Deloria Lair, MD    Brief Narrative:  45 y.o. male with medical history significant of ESLD and CKD with morbid obesity.  He presents today for worsening LE edema and anuria. In the outpatient setting, patient was given an increased dose of diuretic with little to no improvement. Patient was found to have worsening renal function on presentation and was admitted for management of volume overload.  Assessment & Plan:   Principal Problem:   Decompensation of cirrhosis of liver (HCC) Active Problems:   Anemia   Anasarca   Thrombocytopenia (HCC)   Acute renal failure (HCC)   Lower extremity edema   Hyponatremia   Hyperglycemia   Protein-calorie malnutrition, severe (HCC)   CHF (congestive heart failure) (HCC)   Rectal bleeding   Hypokalemia   Elevated brain natriuretic peptide (BNP) level   ATN (acute tubular necrosis) (HCC)   Decompensated cirrhosis of liver/ETOH Cirrhosis -Function profile has stabilized. Recheck LFTs in a.m. -GI following. GI recommendations to continue nadolol 40 mg by mouth daily and 20 mg by mouth in evening. Goal heart rate 55.  ARF -Renal function improved at 2.20. -UOP an impressive 16.2 L past 24 hours. -Has been switched to PO diuretics. -If he can sustain diuresis on PO meds, likely DC home in am.  Sinus Tachycardia vs A Flutter -EKG with what appears to be a flutter, rate controlled. -Has contraindications to anticoagulation :coaguloptahy with liver disease and ongoing GI bleeding and varices. -Continue nadolol for rate control.  Hyponatremia -Sodium stable at around 136. -Suspect hyponatremia secondary to volume overload with decompensated cirrhosis of the liver.  Acutely decompensated chronic CHF -Nephrology following. -See above for details. -Still with marked volume overload on exam.  Suspected acute blood loss Anemia from suspected GI  bleeding -Thus far during this admission, patient has received 1 unit PRBCs on 03/07/2016 and 1 unit on 03/09/2016. Received 2 units of PRBCs on 10/18 for hemoglobin of 6.6, hemoglobin is 7.0 today. Still has been having some low-grade hematochezia with bowel movements, will need to monitor hemoglobin closely, GI is involved in his care. -Continue to follow CBC, transfuse as needed for Hb <7.  Thrombocytopenia -Platelets stable -Repeat CBC in the morning  Malnutrition -Appreciate input by nutrition -For now, continue fluid restricted diet  Hypokalemia -Due to diuresis, will replace. -Magnesium normal at 2.0   DVT prophylaxis: SCDs Code Status: Full code Family Communication: Patient in room, family at bedside Disposition Plan: Uncertain at this time [facility] for [place] home in 1-2 days.  Consultants:   Gastroenterology  Nephrology  Procedures:     Antimicrobials: Anti-infectives    Start     Dose/Rate Route Frequency Ordered Stop   03/07/16 1400  cefTRIAXone (ROCEPHIN) 1 g in dextrose 5 % 50 mL IVPB    Comments:  Please dose adjust if needed for renal insufficency   1 g 100 mL/hr over 30 Minutes Intravenous Every 24 hours 03/07/16 1339 03/13/16 1441   03/06/16 1800  rifaximin (XIFAXAN) tablet 550 mg     550 mg Oral 2 times daily 03/06/16 1734        Subjective: Reports feeling better today. Swelling seems improved per patient  Objective: Vitals:   03/18/16 0328 03/18/16 1430 03/18/16 2202 03/19/16 0551  BP: (!) 106/51 106/62 (!) 105/56 (!) 93/49  Pulse: 95 90 91 (!) 118  Resp: 18 18 18 18   Temp: 98.2 F (36.8 C) 98.7 F (  37.1 C) 98.7 F (37.1 C) 98.5 F (36.9 C)  TempSrc: Oral Oral Oral Oral  SpO2: 100% 100% 100% 99%  Weight:      Height:        Intake/Output Summary (Last 24 hours) at 03/19/16 1246 Last data filed at 03/19/16 1108  Gross per 24 hour  Intake              670 ml  Output            14050 ml  Net           -13380 ml   Filed Weights    03/10/16 0400 03/13/16 0551 03/13/16 1020  Weight: (!) 168.7 kg (371 lb 14.7 oz) (!) 168.4 kg (371 lb 4.1 oz) (!) 170.9 kg (376 lb 12.8 oz)    Examination:  General exam: Awake, lying in bed, no acute distress Respiratory system: Speaking full sentences, normal chest rise, no audible wheezing Cardiovascular system: Regular rate, S1-S2 Gastrointestinal system: Nondistended, positive bowel sounds Central nervous system: No seizures, no tremors Extremities: No cyanosis, perfused Skin: No notable skin lesions seen, no rashes Psychiatry: Normal affect, no visual hallucinations  Data Reviewed: I have personally reviewed following labs and imaging studies  CBC:  Recent Labs Lab 03/13/16 0512 03/14/16 0524 03/14/16 2247 03/16/16 0513 03/18/16 0601 03/19/16 0617  WBC 9.7 8.8  --  10.2 7.9 7.9  HGB 7.1* 6.6* 7.7* 7.3* 7.0* 7.3*  HCT 21.5* 21.3* 23.4* 22.9* 21.5* 22.4*  MCV 95.1 96.8  --  93.1 92.7 92.9  PLT 135* 125*  --  109* 104* 0000000*   Basic Metabolic Panel:  Recent Labs Lab 03/13/16 0512  03/15/16 0921 03/16/16 0513 03/17/16 0552 03/18/16 0601 03/19/16 0617  NA 132*  < > 129* 130* 133* 136 136  K 3.1*  < > 3.8 4.2 3.2* 2.7* 2.5*  CL 102  < > 102 104 105 106 101  CO2 25  < > 22 21* 20* 25 29  GLUCOSE 88  < > 127* 88 87 89 98  BUN 46*  < > 52* 61* 66* 65* 60*  CREATININE 1.44*  < > 2.00* 2.53* 2.70* 2.02* 1.54*  CALCIUM 8.3*  < > 8.2* 8.2* 8.6* 8.7* 8.7*  MG 2.0  --   --   --   --  1.9  --   PHOS  --   --  5.2* 5.2* 5.7*  --  4.3  < > = values in this interval not displayed. GFR: Estimated Creatinine Clearance: 103 mL/min (by C-G formula based on SCr of 1.54 mg/dL (H)). Liver Function Tests:  Recent Labs Lab 03/13/16 0512 03/14/16 0524 03/15/16 0921 03/16/16 0513 03/17/16 0552 03/18/16 0601 03/19/16 0617  AST 57* 59*  --  57*  --  56*  --   ALT 19 18  --  19  --  19  --   ALKPHOS 151* 146*  --  135*  --  127*  --   BILITOT 4.3* 4.2*  --  4.9*  --  3.7*   --   PROT 6.8 6.7  --  6.8  --  6.8  --   ALBUMIN 2.0* 2.0* 2.0* 2.0*  2.0* 2.3* 2.5* 2.3*   No results for input(s): LIPASE, AMYLASE in the last 168 hours. No results for input(s): AMMONIA in the last 168 hours. Coagulation Profile:  Recent Labs Lab 03/12/16 1642 03/14/16 0524  INR 2.25 2.34   Cardiac Enzymes: No results for input(s): CKTOTAL, CKMB,  CKMBINDEX, TROPONINI in the last 168 hours. BNP (last 3 results) No results for input(s): PROBNP in the last 8760 hours. HbA1C: No results for input(s): HGBA1C in the last 72 hours. CBG: No results for input(s): GLUCAP in the last 168 hours. Lipid Profile: No results for input(s): CHOL, HDL, LDLCALC, TRIG, CHOLHDL, LDLDIRECT in the last 72 hours. Thyroid Function Tests: No results for input(s): TSH, T4TOTAL, FREET4, T3FREE, THYROIDAB in the last 72 hours. Anemia Panel: No results for input(s): VITAMINB12, FOLATE, FERRITIN, TIBC, IRON, RETICCTPCT in the last 72 hours. Sepsis Labs: No results for input(s): PROCALCITON, LATICACIDVEN in the last 168 hours.  No results found for this or any previous visit (from the past 240 hour(s)).   Radiology Studies: No results found.  Scheduled Meds: . feeding supplement (PRO-STAT SUGAR FREE 64)  45 mL Oral TID  . folic acid  1 mg Oral Daily  . lactulose  20 g Oral TID  . magnesium oxide  400 mg Oral BID  . metolazone  5 mg Oral BID  . nadolol  20 mg Oral QPC supper  . nadolol  40 mg Oral Daily  . pantoprazole  40 mg Oral BID AC  . potassium chloride  40 mEq Oral QID  . potassium chloride  40 mEq Oral Q4H  . rifaximin  550 mg Oral BID  . sodium chloride flush  3 mL Intravenous Q12H  . thiamine  100 mg Oral Daily  . torsemide  100 mg Oral Daily   Continuous Infusions:    LOS: 13 days   Lelon Frohlich, MD [facility] Pager [phone]  If 7PM-7AM, please contact night-coverage [email or web address] Password [email or web address] 03/19/2016, 12:46 PM

## 2016-03-20 LAB — HEPATIC FUNCTION PANEL
ALBUMIN: 2.3 g/dL — AB (ref 3.5–5.0)
ALK PHOS: 108 U/L (ref 38–126)
ALT: 20 U/L (ref 17–63)
AST: 65 U/L — AB (ref 15–41)
BILIRUBIN TOTAL: 3.7 mg/dL — AB (ref 0.3–1.2)
Bilirubin, Direct: 1.5 mg/dL — ABNORMAL HIGH (ref 0.1–0.5)
Indirect Bilirubin: 2.2 mg/dL — ABNORMAL HIGH (ref 0.3–0.9)
TOTAL PROTEIN: 6.7 g/dL (ref 6.5–8.1)

## 2016-03-20 LAB — PROTIME-INR
INR: 2.27
PROTHROMBIN TIME: 25.4 s — AB (ref 11.4–15.2)

## 2016-03-20 MED ORDER — SPIRONOLACTONE 25 MG PO TABS
50.0000 mg | ORAL_TABLET | Freq: Every day | ORAL | Status: DC
  Administered 2016-03-20 – 2016-03-23 (×4): 50 mg via ORAL
  Filled 2016-03-20 (×4): qty 2

## 2016-03-20 MED ORDER — METOLAZONE 5 MG PO TABS
2.5000 mg | ORAL_TABLET | Freq: Two times a day (BID) | ORAL | Status: DC
  Administered 2016-03-20 – 2016-03-22 (×6): 2.5 mg via ORAL
  Filled 2016-03-20 (×6): qty 1

## 2016-03-20 MED ORDER — TORSEMIDE 20 MG PO TABS
60.0000 mg | ORAL_TABLET | Freq: Every day | ORAL | Status: DC
  Administered 2016-03-20 – 2016-03-23 (×4): 60 mg via ORAL
  Filled 2016-03-20 (×4): qty 3

## 2016-03-20 MED ORDER — POTASSIUM CHLORIDE IN NACL 40-0.9 MEQ/L-% IV SOLN
INTRAVENOUS | Status: DC
  Administered 2016-03-20: 50 mL/h via INTRAVENOUS

## 2016-03-20 MED ORDER — POTASSIUM CHLORIDE 10 MEQ/100ML IV SOLN
10.0000 meq | INTRAVENOUS | Status: AC
Start: 2016-03-20 — End: 2016-03-20
  Administered 2016-03-20 (×3): 10 meq via INTRAVENOUS
  Filled 2016-03-20 (×3): qty 100

## 2016-03-20 NOTE — Progress Notes (Signed)
    Subjective: No further rectal bleeding. Looser stools have ceased since stopping colace, now soft and 3 times per day with just lactulose. No abdominal pain, N/V. No confusion. Continues to feel like edema is improving.   Objective: Vital signs in last 24 hours: Temp:  [98.3 F (36.8 C)-98.4 F (36.9 C)] 98.3 F (36.8 C) (10/24 0500) Pulse Rate:  [90-91] 91 (10/24 0500) Resp:  [20] 20 (10/24 0500) BP: (100-114)/(50-68) 114/55 (10/24 0500) SpO2:  [99 %-100 %] 99 % (10/24 0500) Last BM Date: 03/19/16 General:   Alert and oriented, pleasant Head:  Normocephalic and atraumatic. Abdomen:  Bowel sounds present, soft, obese, pitting edema to flanks improved Extremities:  With 2-3+ pitting pedal/ankle edema and lower leg edema to mid calf Neurologic:  Alert and  oriented x4 Psych:  Alert and cooperative.   Intake/Output from previous day: 10/23 0701 - 10/24 0700 In: 960 [P.O.:954; I.V.:6] Out: 9850 [Urine:9850] Intake/Output this shift: No intake/output data recorded.  Lab Results:  Recent Labs  03/18/16 0601 03/19/16 0617  WBC 7.9 7.9  HGB 7.0* 7.3*  HCT 21.5* 22.4*  PLT 104* 109*   BMET  Recent Labs  03/18/16 0601 03/19/16 0617  NA 136 136  K 2.7* 2.5*  CL 106 101  CO2 25 29  GLUCOSE 89 98  BUN 65* 60*  CREATININE 2.02* 1.54*  CALCIUM 8.7* 8.7*   LFT  Recent Labs  03/18/16 0601 03/19/16 0617 03/20/16 0556  PROT 6.8  --  6.7  ALBUMIN 2.5* 2.3* 2.3*  AST 56*  --  65*  ALT 19  --  20  ALKPHOS 127*  --  108  BILITOT 3.7*  --  3.7*  BILIDIR  --   --  1.5*  IBILI  --   --  2.2*   PT/INR  Recent Labs  03/20/16 0556  LABPROT 25.4*  INR 2.27     Assessment: 45 year old male with decompensated ETOH cirrhosis, presenting with anasarca, anuria, 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 MELD-Na 29 on 03/12/16.  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 No overt GI bleeding in past 24 hours. Total of 4 units PRBCs (last on 10/18). No significant improvement in INR with Vit K, with most recent INR this morning 2.27, which is close to baseline.    Anasarca: improving albeit slowly. Creatinine improving. Repeat renal function panel ordered for this morning. Nephrology input appreciated. Repletion of electrolytes per Nephrology/attending.   Tachycardia yesterday, with EKG showing new onset aflutter. Today heart rate in 90s. Remains on Nadolol 40 mg in morning and 20 in evening. BP has been trending with SBP in the 90s/100. Heart rate not at goal of 55 for variceal bleeding prophylaxis but with significant hypotension, would keep current dosing for now.   AS OF NOTE: he tells me that he has an appt at [facility] Nov 6th.   Plan: Continue Lactulose and Xifaxan Continue Nadolol 40 mg in morning and 20 in evening Continue diuresis per Nephrology Follow H/H Keep appt at [facility] Nov 6th    Annitta Needs, ANP-BC [facility] Gastroenterology     LOS: 14 days    03/20/2016, 7:55 AM

## 2016-03-20 NOTE — Progress Notes (Signed)
PROGRESS NOTE    Antonio [name]  [id number] DOB: 1971/01/29 DOA: 03/06/2016 PCP: Deloria Lair, MD    Brief Narrative:  45 y.o. male with medical history significant of ESLD and CKD with morbid obesity.  He presents today for worsening LE edema and anuria. In the outpatient setting, patient was given an increased dose of diuretic with little to no improvement. Patient was found to have worsening renal function on presentation and was admitted for management of volume overload.  Assessment & Plan:   Principal Problem:   Decompensation of cirrhosis of liver (HCC) Active Problems:   Anemia   Anasarca   Thrombocytopenia (HCC)   Acute renal failure (HCC)   Lower extremity edema   Hyponatremia   Hyperglycemia   Protein-calorie malnutrition, severe (HCC)   CHF (congestive heart failure) (HCC)   Rectal bleeding   Hypokalemia   Elevated brain natriuretic peptide (BNP) level   ATN (acute tubular necrosis) (HCC)   Decompensated cirrhosis of liver/ETOH Cirrhosis -Function profile has stabilized. Recheck LFTs in a.m. -GI following. GI recommendations to continue nadolol 40 mg by mouth daily and 20 mg by mouth in evening. Goal heart rate 55.  ARF -Renal function improved at 1.54. -UOP 9.85 L past 24 hours. -Nephrology has decreased doses of demadex and metolazone today.  Sinus Tachycardia vs A Flutter -EKG with what appears to be a flutter, rate controlled. -Has contraindications to anticoagulation :coaguloptahy with liver disease and ongoing GI bleeding and varices. -Continue nadolol for rate control.  Hyponatremia -Sodium stable at around 136. -Suspect hyponatremia secondary to volume overload with decompensated cirrhosis of the liver.  Acutely decompensated chronic CHF -Nephrology following. -See above for details. -Still with marked volume overload on exam.  Suspected acute blood loss Anemia from suspected GI bleeding -Thus far during this admission, patient has  received 1 unit PRBCs on 03/07/2016 and 1 unit on 03/09/2016. Received 2 units of PRBCs on 10/18 for hemoglobin of 6.6, hemoglobin is 7.0 today. Still has been having some low-grade hematochezia with bowel movements, will need to monitor hemoglobin closely, GI is involved in his care. -Continue to follow CBC, transfuse as needed for Hb <7.  Thrombocytopenia -Platelets stable -Repeat CBC in the morning  Malnutrition -Appreciate input by nutrition -For now, continue fluid restricted diet  Hypokalemia -Due to diuresis, not responding to PO replacement. -Renal has ordered IV replacement in addition to PO replacement. -Magnesium normal at 2.0   DVT prophylaxis: SCDs Code Status: Full code Family Communication: Patient in room, family at bedside Disposition Plan: Uncertain at this time [facility] for DC home in 1-2 days.  Consultants:   Gastroenterology  Nephrology  Procedures:     Antimicrobials: Anti-infectives    Start     Dose/Rate Route Frequency Ordered Stop   03/07/16 1400  cefTRIAXone (ROCEPHIN) 1 g in dextrose 5 % 50 mL IVPB    Comments:  Please dose adjust if needed for renal insufficency   1 g 100 mL/hr over 30 Minutes Intravenous Every 24 hours 03/07/16 1339 03/13/16 1441   03/06/16 1800  rifaximin (XIFAXAN) tablet 550 mg     550 mg Oral 2 times daily 03/06/16 1734        Subjective: Reports feeling better today. Swelling seems improved per patient  Objective: Vitals:   03/19/16 1503 03/19/16 2100 03/20/16 0500 03/20/16 1507  BP: 100/68 (!) 100/50 (!) 114/55 (!) 100/52  Pulse: 90 91 91 91  Resp: 20 20 20 18   Temp: 98.4 F (36.9 C) 98.4 F (  36.9 C) 98.3 F (36.8 C) 98.2 F (36.8 C)  TempSrc: Oral Oral Oral Oral  SpO2: 100% 100% 99% 100%  Weight:      Height:        Intake/Output Summary (Last 24 hours) at 03/20/16 1531 Last data filed at 03/20/16 1103  Gross per 24 hour  Intake              345 ml  Output             7900 ml  Net            -7555  ml   Filed Weights   03/10/16 0400 03/13/16 0551 03/13/16 1020  Weight: (!) 168.7 kg (371 lb 14.7 oz) (!) 168.4 kg (371 lb 4.1 oz) (!) 170.9 kg (376 lb 12.8 oz)    Examination:  General exam: Awake, lying in bed, no acute distress Respiratory system: Speaking full sentences, normal chest rise, no audible wheezing Cardiovascular system: Regular rate, S1-S2 Gastrointestinal system: Nondistended, positive bowel sounds Central nervous system: No seizures, no tremors Extremities: No cyanosis, perfused Skin: No notable skin lesions seen, no rashes Psychiatry: Normal affect, no visual hallucinations  Data Reviewed: I have personally reviewed following labs and imaging studies  CBC:  Recent Labs Lab 03/14/16 0524 03/14/16 2247 03/16/16 0513 03/18/16 0601 03/19/16 0617  WBC 8.8  --  10.2 7.9 7.9  HGB 6.6* 7.7* 7.3* 7.0* 7.3*  HCT 21.3* 23.4* 22.9* 21.5* 22.4*  MCV 96.8  --  93.1 92.7 92.9  PLT 125*  --  109* 104* 0000000*   Basic Metabolic Panel:  Recent Labs Lab 03/15/16 0921 03/16/16 0513 03/17/16 0552 03/18/16 0601 03/19/16 0617  NA 129* 130* 133* 136 136  K 3.8 4.2 3.2* 2.7* 2.5*  CL 102 104 105 106 101  CO2 22 21* 20* 25 29  GLUCOSE 127* 88 87 89 98  BUN 52* 61* 66* 65* 60*  CREATININE 2.00* 2.53* 2.70* 2.02* 1.54*  CALCIUM 8.2* 8.2* 8.6* 8.7* 8.7*  MG  --   --   --  1.9  --   PHOS 5.2* 5.2* 5.7*  --  4.3   GFR: Estimated Creatinine Clearance: 103 mL/min (by C-G formula based on SCr of 1.54 mg/dL (H)). Liver Function Tests:  Recent Labs Lab 03/14/16 0524  03/16/16 0513 03/17/16 0552 03/18/16 0601 03/19/16 0617 03/20/16 0556  AST 59*  --  57*  --  56*  --  65*  ALT 18  --  19  --  19  --  20  ALKPHOS 146*  --  135*  --  127*  --  108  BILITOT 4.2*  --  4.9*  --  3.7*  --  3.7*  PROT 6.7  --  6.8  --  6.8  --  6.7  ALBUMIN 2.0*  < > 2.0*  2.0* 2.3* 2.5* 2.3* 2.3*  < > = values in this interval not displayed. No results for input(s): LIPASE, AMYLASE in  the last 168 hours. No results for input(s): AMMONIA in the last 168 hours. Coagulation Profile:  Recent Labs Lab 03/14/16 0524 03/20/16 0556  INR 2.34 2.27   Cardiac Enzymes: No results for input(s): CKTOTAL, CKMB, CKMBINDEX, TROPONINI in the last 168 hours. BNP (last 3 results) No results for input(s): PROBNP in the last 8760 hours. HbA1C: No results for input(s): HGBA1C in the last 72 hours. CBG: No results for input(s): GLUCAP in the last 168 hours. Lipid Profile: No results for input(s):  CHOL, HDL, LDLCALC, TRIG, CHOLHDL, LDLDIRECT in the last 72 hours. Thyroid Function Tests: No results for input(s): TSH, T4TOTAL, FREET4, T3FREE, THYROIDAB in the last 72 hours. Anemia Panel: No results for input(s): VITAMINB12, FOLATE, FERRITIN, TIBC, IRON, RETICCTPCT in the last 72 hours. Sepsis Labs: No results for input(s): PROCALCITON, LATICACIDVEN in the last 168 hours.  No results found for this or any previous visit (from the past 240 hour(s)).   Radiology Studies: No results found.  Scheduled Meds: . feeding supplement (PRO-STAT SUGAR FREE 64)  45 mL Oral TID  . folic acid  1 mg Oral Daily  . lactulose  20 g Oral TID  . magnesium oxide  400 mg Oral BID  . metolazone  2.5 mg Oral BID  . nadolol  20 mg Oral QPC supper  . nadolol  40 mg Oral Daily  . pantoprazole  40 mg Oral BID AC  . rifaximin  550 mg Oral BID  . sodium chloride flush  3 mL Intravenous Q12H  . spironolactone  50 mg Oral Daily  . thiamine  100 mg Oral Daily  . torsemide  60 mg Oral Daily   Continuous Infusions: . 0.9 % NaCl with KCl 40 mEq / L 50 mL/hr (03/20/16 0823)     LOS: 14 days   Lelon Frohlich, MD [facility] Pager [phone]  If 7PM-7AM, please contact night-coverage [email or web address] Password [email or web address] 03/20/2016, 3:31 PM

## 2016-03-20 NOTE — Progress Notes (Signed)
Subjective:  Patient offers no complaints. He denies any difficulty breathing. Overall is feeling much better.  Objective: Vital signs in last 24 hours: Temp:  [98.3 F (36.8 C)-98.4 F (36.9 C)] 98.3 F (36.8 C) (10/24 0500) Pulse Rate:  [90-91] 91 (10/24 0500) Resp:  [20] 20 (10/24 0500) BP: (100-114)/(50-68) 114/55 (10/24 0500) SpO2:  [99 %-100 %] 99 % (10/24 0500) Weight change:   Intake/Output from previous day: 10/23 0701 - 10/24 0700 In: 960 [P.O.:954; I.V.:6] Out: 9850 [Urine:9850] Intake/Output this shift: No intake/output data recorded.   EXAM: General appearance:  Alert, cooperative, in no apparent distress Resp:  Diminished breath sounds, but clear bilaterally Cardio:  RRR without murmur or rub GI: + BS, soft, nontender Extremities:  2+ edema bilaterally  Lab Results:  Recent Labs  03/18/16 0601 03/19/16 0617  WBC 7.9 7.9  HGB 7.0* 7.3*  HCT 21.5* 22.4*  PLT 104* 109*   BMET:   Recent Labs  03/18/16 0601 03/19/16 0617 03/20/16 0556  NA 136 136  --   K 2.7* 2.5*  --   CL 106 101  --   CO2 25 29  --   GLUCOSE 89 98  --   BUN 65* 60*  --   CREATININE 2.02* 1.54*  --   CALCIUM 8.7* 8.7*  --   ALBUMIN 2.5* 2.3* 2.3*   No results for input(s): PTH in the last 72 hours. Iron Studies: No results for input(s): IRON, TIBC, TRANSFERRIN, FERRITIN in the last 72 hours.  Studies/Results: No results found.   Assessment: 1. Acute kidney injury superimposed on chronic - History and function continue to improve. Presently he is asymptomatic. 2. Anasarca - patient is on metolazone 5 mg by mouth twice a day Demadex 100 mg by mouth daily. Patient has good urine output. Still he has significant sign of fluid overload 3. Hypokalemia - Secondary to diuretics and fluid loss, his potassium has fallen to 2. 5. He is on potassium supplement by mouth and potassium doesn't seem to be improving. 4. Hypervolemic hyponatremia - His sodium has corrected. 5. Anemia - Most  likely from GI bleeding, his hemoglobin is low but stable. 6. Obesity 7. History of liver cirrhosis   Plan:  KCl 10 mEq in 100 cc of NS IV over 1 hr x 3            KCl 40 mEq PO qid            We'll start with normal saline 40 mEq IV at 50 mL per hour            Renal panel & CBC tomorrow AM            Aldactone 50 mg by mouth daily            Demadex 60mg  po once a day             Decrease metolazone 2.5 mg by mouth daily   LOS: 14 days   Antonio [name] S 03/20/2016,7:43 AM

## 2016-03-20 NOTE — Care Management Note (Signed)
Case Management Note  Patient Details  Name: Antonio [name] MRN: [id number] Date of Birth: February 28, 1971  If discussed at [place] Length of Stay Meetings, dates discussed:  03/20/2016  Additional Comments: hypokalemia and restarting IVF + K  Sherald Barge, RN 03/20/2016, 2:35 PM

## 2016-03-21 LAB — RENAL FUNCTION PANEL
Albumin: 2.4 g/dL — ABNORMAL LOW (ref 3.5–5.0)
Anion gap: 7 (ref 5–15)
BUN: 56 mg/dL — ABNORMAL HIGH (ref 6–20)
CHLORIDE: 95 mmol/L — AB (ref 101–111)
CO2: 32 mmol/L (ref 22–32)
CREATININE: 1.65 mg/dL — AB (ref 0.61–1.24)
Calcium: 8.3 mg/dL — ABNORMAL LOW (ref 8.9–10.3)
GFR calc non Af Amer: 49 mL/min — ABNORMAL LOW (ref >60)
GFR, EST AFRICAN AMERICAN: 57 mL/min — AB (ref >60)
Glucose, Bld: 86 mg/dL (ref 65–99)
Phosphorus: 4 mg/dL (ref 2.5–4.6)
Potassium: 2.7 mmol/L — CL (ref 3.5–5.1)
Sodium: 134 mmol/L — ABNORMAL LOW (ref 135–145)

## 2016-03-21 MED ORDER — POTASSIUM CHLORIDE CRYS ER 20 MEQ PO TBCR
40.0000 meq | EXTENDED_RELEASE_TABLET | ORAL | Status: AC
  Administered 2016-03-21 (×3): 40 meq via ORAL
  Filled 2016-03-21 (×3): qty 2

## 2016-03-21 NOTE — Progress Notes (Signed)
CRITICAL VALUE ALERT  Critical value received:  Potassium 2.7  Date of notification:  03/21/2016 Time of notification:  0727  Critical value read back:Yes.    Nurse who received alert:  Janace Aris   MD notified (1st page):  Dr. Sarajane Jews  Time of first page:  0728  Responding MD:  Dr. Sarajane Jews  Time MD responded:  [id number]

## 2016-03-21 NOTE — Progress Notes (Signed)
PROGRESS NOTE  Antonio [name] [id number] DOB: [birth year] DOA: 03/06/2016 PCP: Deloria Lair, MD  Brief Narrative: 45 year old man with end-stage liver disease, chronic kidney disease, morbid obesity, who presented with increasing swelling and anuria. Admitted for decompensated cirrhosis, acute kidney injury. Seen by nephrology and gastroenterology consultation. Developed GI bleed, underwent EGD and colonoscopy, then had further bleeding, possibly related to polypectomy versus rectal varices. Diuresis was difficult and managed by nephrology. Hospitalization was prolonged by difficulty diuresing as well as need for closely monitoring hemoglobin getting slow chronic GI bleed as followed by GI.  Assessment/Plan: 1. 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 Anasarca responding well to diuresis, -4 L last 24 hours. Management per nephrology. 2. Acute blood loss anemia secondary to GI bleed. Hemoglobin stable. 3. Hypokalemia. 4. Chronic kidney disease stage III, stable. 5. Anemia of chronic kidney disease 6. Morbid obesity 7. Possible atrial flutter. Anticoagulation contraindicated currently sinus rhythm on telemetry.    Appears to be improving. Management of GI nephrology issues per the specialties.   Replace potassium aggressively. Check magnesium in the morning.   May be approaching discharge   DVT prophylaxis: SCDs Code Status: full code Family Communication: none Disposition Plan: home?  [name], MD  [facility] Direct contact: [phone] --Via amion app OR  --[email or web address]; password [email or web address]  7PM-7AM contact night coverage as above 03/21/2016, 5:05 PM  LOS: 15 days   Consultants:  Nephrology  Gastroenterology  Procedures:  Colonoscopy 10/12 Impression:               - One 8 mm polyp in the descending  colon, removed                            with a hot snare. Resected and retrieved.                           - One 4 mm polyp in the cecum, removed with a cold                            snare. Resected and retrieved.                           Rectal varices. Melanosis coli. Abnormal left colon                            mucosa status post biopsy.  EGD Impression:               - Grade II esophageal varices .                           - Portal hypertensive gastropathy.                           - Normal duodenal bulb and second portion of the                            duodenum.                           - No  specimens collected. I suspect patient bled                            from his lower GI tract. See colonoscopy report.                            Follow hemoglobin.   Interval history/Subjective: Feels well. No complaints.    Objective: Vitals:   03/20/16 0500 03/20/16 1507 03/20/16 2211 03/21/16 0604  BP: (!) 114/55 (!) 100/52 (!) 101/50 (!) 103/58  Pulse: 91 91 91 92  Resp: 20 18 20 20   Temp: 98.3 F (36.8 C) 98.2 F (36.8 C) 98.4 F (36.9 C) 97.9 F (36.6 C)  TempSrc: Oral Oral Oral Oral  SpO2: 99% 100% 100% 100%  Weight:      Height:        Intake/Output Summary (Last 24 hours) at 03/21/16 1705 Last data filed at 03/21/16 1527  Gross per 24 hour  Intake              720 ml  Output             4350 ml  Net            -3630 ml     Filed Weights   03/10/16 0400 03/13/16 0551 03/13/16 1020  Weight: (!) 168.7 kg (371 lb 14.7 oz) (!) 168.4 kg (371 lb 4.1 oz) (!) 170.9 kg (376 lb 12.8 oz)    Exam:    Constitutional:  . Appears calm and comfortable Respiratory:  . CTA bilaterally, no w/r/r.  . Respiratory effort normal. No retractions or accessory muscle use Cardiovascular:  . RRR, no m/r/g . Telemetry SR Abdomen:  . Abdomen appears normal; no tenderness or masses . No hernias . No HSM Psychiatric:  . judgement and insight appear normal . Mental  status o Mood, affect appropriate  I have personally reviewed following labs and imaging studies: Potassium 2.7, BUN stable 56, creatinine stable 1.65   Scheduled Meds: . feeding supplement (PRO-STAT SUGAR FREE 64)  45 mL Oral TID  . folic acid  1 mg Oral Daily  . lactulose  20 g Oral TID  . magnesium oxide  400 mg Oral BID  . metolazone  2.5 mg Oral BID  . nadolol  20 mg Oral QPC supper  . nadolol  40 mg Oral Daily  . pantoprazole  40 mg Oral BID AC  . rifaximin  550 mg Oral BID  . sodium chloride flush  3 mL Intravenous Q12H  . spironolactone  50 mg Oral Daily  . thiamine  100 mg Oral Daily  . torsemide  60 mg Oral Daily   Continuous Infusions:   Principal Problem:   Decompensation of cirrhosis of liver (HCC) Active Problems:   Anemia   Anasarca   Thrombocytopenia (HCC)   Acute renal failure (HCC)   Lower extremity edema   Hyponatremia   Hyperglycemia   Protein-calorie malnutrition, severe (HCC)   CHF (congestive heart failure) (HCC)   Rectal bleeding   Hypokalemia   Elevated brain natriuretic peptide (BNP) level   ATN (acute tubular necrosis) (HCC)   LOS: 15 days

## 2016-03-21 NOTE — Progress Notes (Signed)
Removed patient from telemetry.

## 2016-03-21 NOTE — Progress Notes (Signed)
Brief encounter with patient. He reports he has not been receiving scheduled snacks. Will speak with dietary regarding this. Otherwise, pt reports no n/v/c/d or poor appetite. Meal intake remains good.   Burtis Junes RD, LDN, CNSC Clinical Nutrition Pager: [phone] 03/21/2016 11:15 AM

## 2016-03-21 NOTE — Progress Notes (Signed)
Antonio [name]  MRN: [id number]  DOB/AGE: June 29, 1970 45 y.o.  Primary Care Physician:TAPPER,DAVID B, MD  Admit date: 03/06/2016  Chief Complaint:  Chief Complaint  Patient presents with  . Leg Swelling    S-Pt presented on  03/06/2016 with  Chief Complaint  Patient presents with  . Leg Swelling  .    Pt  Offers no new complaints.        Pt says " my swelling is much better"   Meds . feeding supplement (PRO-STAT SUGAR FREE 64)  45 mL Oral TID  . folic acid  1 mg Oral Daily  . lactulose  20 g Oral TID  . magnesium oxide  400 mg Oral BID  . metolazone  2.5 mg Oral BID  . nadolol  20 mg Oral QPC supper  . nadolol  40 mg Oral Daily  . pantoprazole  40 mg Oral BID AC  . potassium chloride  40 mEq Oral Q4H  . rifaximin  550 mg Oral BID  . sodium chloride flush  3 mL Intravenous Q12H  . spironolactone  50 mg Oral Daily  . thiamine  100 mg Oral Daily  . torsemide  60 mg Oral Daily      Physical Exam: Vital signs in last 24 hours: Temp:  [97.9 F (36.6 C)-98.4 F (36.9 C)] 97.9 F (36.6 C) (10/25 0604) Pulse Rate:  [91-92] 92 (10/25 0604) Resp:  [18-20] 20 (10/25 0604) BP: (100-103)/(50-58) 103/58 (10/25 0604) SpO2:  [100 %] 100 % (10/25 0604) Weight change:  Last BM Date: 03/19/16  Intake/Output from previous day: 10/24 0701 - 10/25 0700 In: 1082.8 [P.O.:702; I.V.:380.8] Out: 4200 [Urine:4200] Total I/O In: -  Out: 1500 [Urine:1500]   Physical Exam: General- pt is awake,alert, follows commands Resp- No acute REsp distress,decreased bs at bases. CVS- S1S2 regular in rate and rhythm GIT- BS+, soft, distended, morbidly obese EXT- 1+ LE Edema, No Cyanosis.   Lab Results: CBC  Recent Labs  03/19/16 0617  WBC 7.9  HGB 7.3*  HCT 22.4*  PLT 109*    BMET  Recent Labs  03/19/16 0617 03/21/16 0612  NA 136 134*  K 2.5* 2.7*  CL 101 95*  CO2 29 32  GLUCOSE 98 86  BUN 60* 56*  CREATININE 1.54* 1.65*  CALCIUM 8.7* 8.3*   Creat trend 2017   3.58==>1.44==> 2.7==> 2.0=>1.5--1.6 2017  1.2--2.0( august admission)     MICRO No results found for this or any previous visit (from the past 240 hour(s)).    Lab Results  Component Value Date   CALCIUM 8.3 (L) 03/21/2016   PHOS 4.0 03/21/2016         Impression: 1)Renal  AKI secondary to Prerenal/ATN                AKI possibly Hepatorenal                Data against hepatorenal                Urine Na more than 10                Not Oliguric                Another episode of AKI during inpt stay                   AKI now improving  2)HTN  BP stable  Medication- On Diuretics-  3)Anemia HGb  Low.  Sec to GI bleeding  received PRBC GI and  Primary team  following  4)Anasarca- on Diuretics    40 liters negative since admission  5)Thrombocytopenia Sec to Liver related issues Primary MD follwoing following  6)Electrolytes  Hypokalemic      Being replete  Hyponatremic     Hypervolemia hyponatremia     7)Acid base Co2 at goal     Plan:  Potassium being replaced  Will continue current care Will follow bmet        [place] S 03/21/2016, 9:00 AM

## 2016-03-22 ENCOUNTER — Ambulatory Visit: Payer: BLUE CROSS/BLUE SHIELD | Admitting: Gastroenterology

## 2016-03-22 DIAGNOSIS — E876 Hypokalemia: Secondary | ICD-10-CM

## 2016-03-22 LAB — HEPATIC FUNCTION PANEL
ALT: 22 U/L (ref 17–63)
AST: 74 U/L — ABNORMAL HIGH (ref 15–41)
Albumin: 2.3 g/dL — ABNORMAL LOW (ref 3.5–5.0)
Alkaline Phosphatase: 126 U/L (ref 38–126)
BILIRUBIN INDIRECT: 2.1 mg/dL — AB (ref 0.3–0.9)
Bilirubin, Direct: 1.5 mg/dL — ABNORMAL HIGH (ref 0.1–0.5)
TOTAL PROTEIN: 6.7 g/dL (ref 6.5–8.1)
Total Bilirubin: 3.6 mg/dL — ABNORMAL HIGH (ref 0.3–1.2)

## 2016-03-22 LAB — CBC
HCT: 23.2 % — ABNORMAL LOW (ref 39.0–52.0)
Hemoglobin: 7.2 g/dL — ABNORMAL LOW (ref 13.0–17.0)
MCH: 29.3 pg (ref 26.0–34.0)
MCHC: 31 g/dL (ref 30.0–36.0)
MCV: 94.3 fL (ref 78.0–100.0)
PLATELETS: 120 10*3/uL — AB (ref 150–400)
RBC: 2.46 MIL/uL — AB (ref 4.22–5.81)
RDW: 19.6 % — ABNORMAL HIGH (ref 11.5–15.5)
WBC: 6.8 10*3/uL (ref 4.0–10.5)

## 2016-03-22 LAB — BASIC METABOLIC PANEL
Anion gap: 7 (ref 5–15)
BUN: 59 mg/dL — AB (ref 6–20)
CALCIUM: 8.6 mg/dL — AB (ref 8.9–10.3)
CO2: 31 mmol/L (ref 22–32)
CREATININE: 1.73 mg/dL — AB (ref 0.61–1.24)
Chloride: 94 mmol/L — ABNORMAL LOW (ref 101–111)
GFR calc Af Amer: 54 mL/min — ABNORMAL LOW (ref >60)
GFR, EST NON AFRICAN AMERICAN: 47 mL/min — AB (ref >60)
Glucose, Bld: 77 mg/dL (ref 65–99)
Potassium: 2.9 mmol/L — ABNORMAL LOW (ref 3.5–5.1)
SODIUM: 132 mmol/L — AB (ref 135–145)

## 2016-03-22 LAB — PROTIME-INR
INR: 2.19
PROTHROMBIN TIME: 24.7 s — AB (ref 11.4–15.2)

## 2016-03-22 LAB — MAGNESIUM: MAGNESIUM: 1.4 mg/dL — AB (ref 1.7–2.4)

## 2016-03-22 MED ORDER — POTASSIUM CHLORIDE IN NACL 40-0.9 MEQ/L-% IV SOLN
INTRAVENOUS | Status: DC
  Administered 2016-03-22: 50 mL/h via INTRAVENOUS

## 2016-03-22 MED ORDER — MAGNESIUM SULFATE 2 GM/50ML IV SOLN
2.0000 g | Freq: Once | INTRAVENOUS | Status: AC
  Administered 2016-03-22: 2 g via INTRAVENOUS
  Filled 2016-03-22: qty 50

## 2016-03-22 MED ORDER — POTASSIUM CHLORIDE CRYS ER 20 MEQ PO TBCR
40.0000 meq | EXTENDED_RELEASE_TABLET | ORAL | Status: AC
  Administered 2016-03-22 (×3): 40 meq via ORAL
  Filled 2016-03-22 (×2): qty 2

## 2016-03-22 NOTE — Progress Notes (Signed)
Subjective: Feels well this morning. Denies abdominal pain, N/V. Can tell his swelling has gone down significantly. Is looking forward to his appointment at [facility] 04/02/16 for transplant evaluation. No further GI bleed, no other upper or lower GI complaints.  Objective: Vital signs in last 24 hours: Temp:  [98.1 F (36.7 C)-98.2 F (36.8 C)] 98.1 F (36.7 C) (10/26 0624) Pulse Rate:  [88-100] 88 (10/26 0624) Resp:  [16] 16 (10/26 0624) BP: (97-105)/(53-54) 105/53 (10/26 0624) SpO2:  [100 %] 100 % (10/26 0624) Weight:  [328 lb (148.8 kg)] 328 lb (148.8 kg) (10/26 0624) Last BM Date: 03/19/16 General:   Alert and oriented, pleasant. Morbidly obese with edema Head:  Normocephalic and atraumatic. Eyes:  No icterus, sclera clear. Conjuctiva pink.  Heart:  S1, S2 present, no murmurs noted.  Lungs: Clear to auscultation bilaterally, without wheezing, rales, or rhonchi.  Abdomen:  Bowel sounds present, obese but softer with diuresis, non-tender, non-distended. No rebound or guarding. No masses appreciated  Msk:  Symmetrical without gross deformities. Extremities:  Continued pitting edema, improed; edema up to thighs. Neurologic:  Alert and  oriented x4;  grossly normal neurologically. Skin:  Warm and dry, intact without significant lesions.  Psych:  Alert and cooperative. Normal mood and affect.  Intake/Output from previous day: 10/25 0701 - 10/26 0700 In: 483 [P.O.:480; I.V.:3] Out: 5550 [Urine:5550] Intake/Output this shift: No intake/output data recorded.  Lab Results: No results for input(s): WBC, HGB, HCT, PLT in the last 72 hours. BMET  Recent Labs  03/21/16 0612 03/22/16 0845  NA 134* 132*  K 2.7* 2.9*  CL 95* 94*  CO2 32 31  GLUCOSE 86 77  BUN 56* 59*  CREATININE 1.65* 1.73*  CALCIUM 8.3* 8.6*   LFT  Recent Labs  03/20/16 0556 03/21/16 0612  PROT 6.7  --   ALBUMIN 2.3* 2.4*  AST 65*  --   ALT 20  --   ALKPHOS 108  --   BILITOT 3.7*  --   BILIDIR 1.5*   --   IBILI 2.2*  --    PT/INR  Recent Labs  03/20/16 0556  LABPROT 25.4*  INR 2.27   Hepatitis Panel No results for input(s): HEPBSAG, HCVAB, HEPAIGM, HEPBIGM in the last 72 hours.   Studies/Results: No results found.  Assessment: 45 year old male with decompensated ETOH cirrhosis, presenting with anasarca, anuria, 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 MELD-Na 29 on 03/12/16.  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 No overt GI bleeding in past 24 hours. Total of 4 units PRBCs (last on 10/18). No significant improvement in INR with Vit K, with most recent INR 10/24 at 2.27, which is close to baseline.   Anasarca: improving albeit slowly. Creatinine improving. BMP this morning with stable Cr, likely near baseline. Nephrology input appreciated. Repletion of electrolytes per Nephrology/attending.   Admission weight 363 lb on 10/10, weight-max 376 lb on 10/17; Weight today 328 lb (48 lb loss from weight-max).  Tachycardia 03/19/16, with EKG showing new onset aflutter. Today heart rate in 80s - 90s. Remains on Nadolol 40 mg in morning and 20 in evening. BP has been trending with SBP in the 90s/100. Heart rate not at goal of 55 for variceal bleeding prophylaxis but with significant hypotension, would keep current dosing for now.    Plan: 1. Update liver  labs: HFP, INR for MELD calculation 2. Continue Xifaxan and Lactulose 3. Continued diuresis and outpatient diuresis per Nephrology 4. Electrolyte replacement per Nephrology 5. Keep transplant evaluation with [facility] 11/6   Thank you for allowing [place] to participate in the care of [place], DNP, AGNP-C Adult & Gerontological Nurse Practitioner [facility]       LOS: 16 days    03/22/2016, 10:18 AM

## 2016-03-22 NOTE — Progress Notes (Signed)
PROGRESS NOTE  Antonio [name] [id number] DOB: March 13, 1971 DOA: 03/06/2016 PCP: Deloria Lair, MD  Brief Narrative: 44 year old man with end-stage liver disease, chronic kidney disease, morbid obesity, who presented with increasing swelling and anuria. Admitted for decompensated cirrhosis, acute kidney injury. Seen by nephrology and gastroenterology consultation. Developed GI bleed, underwent EGD and colonoscopy, then had further bleeding, possibly related to polypectomy versus rectal varices. Diuresis was difficult and managed by nephrology. Hospitalization was prolonged by difficulty diuresing as well as need for closely monitoring hemoglobin getting slow chronic GI bleed as followed by GI.  Assessment/Plan: 1. 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 Excellent diuresis, down 48 pounds from maximum weight October 17 Management per nephrology. Urine output 5550 last 24 hours. 2. Hypokalemia, some improvement. 3. Hypomagnesemia 4. Acute blood loss anemia secondary to GI bleed superimposed on anemia of chronic kidney disease. Hemoglobin remained stable. 5. Hypokalemia. Replete. 6. Chronic kidney disease stage III, stable. 7. Morbid obesity 8. Possible atrial flutter. Anticoagulation contraindicated. Has been in sinus rhythm.   Overall continues to improve. Still has some volume overload but has excellent diuresis on oral regimen.  Replace potassium and magnesium  Potassium 3 greater would anticipate discharge on 10/27  DVT prophylaxis: SCDs Code Status: full code Family Communication: none Disposition Plan: home?  [name], MD  [facility] Direct contact: [phone] --Via amion app OR  --[email or web address]; password [email or web address]  7PM-7AM contact night coverage as above 03/22/2016, 2:25 PM  LOS: 16 days    Consultants:  Nephrology  Gastroenterology  Procedures:  Colonoscopy 10/12 Impression:               - One 8 mm polyp in the descending colon, removed                            with a hot snare. Resected and retrieved.                           - One 4 mm polyp in the cecum, removed with a cold                            snare. Resected and retrieved.                           Rectal varices. Melanosis coli. Abnormal left colon                            mucosa status post biopsy.  EGD Impression:               - Grade II esophageal varices .                           - Portal hypertensive gastropathy.                           - Normal duodenal bulb and second portion of the                            duodenum.                           -  No specimens collected. I suspect patient bled                            from his lower GI tract. See colonoscopy report.                            Follow hemoglobin.   Interval history/Subjective:   Overall feels well. No complaints.  Objective: Vitals:   03/20/16 2211 03/21/16 0604 03/21/16 2059 03/22/16 0624  BP: (!) 101/50 (!) 103/58 (!) 97/54 (!) 105/53  Pulse: 91 92 100 88  Resp: 20 20 16 16   Temp: 98.4 F (36.9 C) 97.9 F (36.6 C) 98.2 F (36.8 C) 98.1 F (36.7 C)  TempSrc: Oral Oral  Oral  SpO2: 100% 100% 100% 100%  Weight:    (!) 148.8 kg (328 lb)  Height:        Intake/Output Summary (Last 24 hours) at 03/22/16 1425 Last data filed at 03/22/16 0625  Gross per 24 hour  Intake              243 ml  Output             3350 ml  Net            -3107 ml     Filed Weights   03/13/16 0551 03/13/16 1020 03/22/16 0624  Weight: (!) 168.4 kg (371 lb 4.1 oz) (!) 170.9 kg (376 lb 12.8 oz) (!) 148.8 kg (328 lb)    Exam:   Constitutional:  . Appears calm and comfortable Respiratory:  . CTA bilaterally, no w/r/r.  . Respiratory effort normal. No retractions or accessory muscle use Cardiovascular:  . RRR, no  m/r/g . 2+ bilateral lower extremity edema Psychiatric:  . judgement and insight appear normal . Mental status o Mood, affect appropriate   I have personally reviewed following labs and imaging studies: -5.55 L overnight Potassium 2.9, magnesium 1.4 LFTs without significant change Hemoglobin stable 7.2. Platelets stable 120. INR 2.19  Scheduled Meds: . feeding supplement (PRO-STAT SUGAR FREE 64)  45 mL Oral TID  . folic acid  1 mg Oral Daily  . lactulose  20 g Oral TID  . magnesium oxide  400 mg Oral BID  . metolazone  2.5 mg Oral BID  . nadolol  20 mg Oral QPC supper  . nadolol  40 mg Oral Daily  . pantoprazole  40 mg Oral BID AC  . rifaximin  550 mg Oral BID  . sodium chloride flush  3 mL Intravenous Q12H  . spironolactone  50 mg Oral Daily  . thiamine  100 mg Oral Daily  . torsemide  60 mg Oral Daily   Continuous Infusions: . 0.9 % NaCl with KCl 40 mEq / L 50 mL/hr (03/22/16 1011)    Principal Problem:   Decompensation of cirrhosis of liver (HCC) Active Problems:   Anemia   Anasarca   Thrombocytopenia (HCC)   Acute renal failure (HCC)   Lower extremity edema   Hyponatremia   Hyperglycemia   Protein-calorie malnutrition, severe (HCC)   CHF (congestive heart failure) (HCC)   Rectal bleeding   Hypokalemia   Elevated brain natriuretic peptide (BNP) level   ATN (acute tubular necrosis) (HCC)   LOS: 16 days

## 2016-03-22 NOTE — Evaluation (Signed)
Physical Therapy Evaluation Patient Details Name: Antonio [name] MRN: [id number] DOB: 1970-11-14 Today's Date: 03/22/2016   History of Present Illness  Antonio [name] is a 45yo black male who comes to [facility] with worsening LEE and anuria. PMH: ETOH abuse, alcoholic hepatitis, cirrhosis.   Clinical Impression  Upon entry, the patient is received seated in bed, no family/caregiver present. The pt is awake and agreeable to participate. No acute distress noted at this time. VSS during eval. The pt is alert and oriented x3, pleasant, conversational, and following simple and multi-step commands consistently. Functional mobility assessment demonstrates mild-moderate weakness, the pt now requiring moderate effort and supervision for transfers and gait, whereas the patient performs these with modified independence at baseline. Activity tolerance is limited, requiring a slower gait speed and frequent breaks due to worsening lightheadedness and BLE fatigue with gait. LEE is noted bilat, not assessed by PT, but closely monitored and medically managed at this time.   Patient presenting with impairment of strength, balance, and activity tolerance, limiting ability to perform ADL and mobility tasks at  baseline level of function. Patient will benefit from skilled intervention to address the above impairments and limitations, in order to restore to prior level of function, improve patient safety upon discharge, and to decrease falls risk.       Follow Up Recommendations Outpatient PT;Supervision - Intermittent (encouraged pt to atleast be evaluated; he thinks deficits are LEE related. )    Equipment Recommendations  None recommended by PT    Recommendations for Other Services       Precautions / Restrictions Precautions Precautions: None Restrictions Weight Bearing Restrictions: No      Mobility  Bed Mobility Overal bed mobility: Modified Independent                Transfers Overall transfer  level: Modified independent Equipment used: None                Ambulation/Gait Ambulation/Gait assistance: Min guard Ambulation Distance (Feet): 200 Feet Assistive device: None   Gait velocity: 0.26m/s Gait velocity interpretation: <1.8 ft/sec, indicative of risk for recurrent falls General Gait Details: Fatigued, lightheaded, pt requires several pauses to rest. BP has been soft recently, and patient weak from prolonged admission.   Stairs            Wheelchair Mobility    Modified Rankin (Stroke Patients Only)       Balance Overall balance assessment: Independent                                           Pertinent Vitals/Pain Pain Assessment: No/denies pain    Home Living Family/patient expects to be discharged to:: Private residence Living Arrangements: Spouse/significant other Available Help at Discharge: Family Type of Home: [place]: [place] Rile - 2 wheels;Wheelchair - manual      Prior Function Level of Independence: Independent         Comments: INDEP with basic ADL; some help from family with meals/IADL.      Hand Dominance   Dominant Hand: Right    Extremity/Trunk Assessment   Upper Extremity Assessment: Generalized weakness           Lower Extremity Assessment: Generalized weakness (LEE noted, pt reports significant improvement. )         Communication   Communication: No difficulties  Cognition Arousal/Alertness: Awake/alert Behavior During Therapy: WFL for tasks assessed/performed Overall Cognitive Status: Within Functional Limits for tasks assessed                      General Comments      Exercises     Assessment/Plan    PT Assessment Patient needs continued PT services  PT Problem List Decreased strength;Cardiopulmonary status limiting activity;Decreased activity tolerance;Decreased mobility          PT Treatment Interventions Stair training;Gait  training;Functional mobility training;Therapeutic activities;Patient/family education;Therapeutic exercise    PT Goals (Current goals can be found in the Care Plan section)  Acute Rehab PT Goals Patient Stated Goal: return to baseline mobility PT Goal Formulation: With patient Time For Goal Achievement: 04/05/16 Potential to Achieve Goals: Good    Frequency Min 2X/week   Barriers to discharge        Co-evaluation               End of Session Equipment Utilized During Treatment: Gait belt Activity Tolerance: Patient tolerated treatment well;Patient limited by fatigue;No increased pain Patient left: in bed;with call bell/phone within reach           Time: 1241-1255 PT Time Calculation (min) (ACUTE ONLY): 14 min   Charges:   PT Evaluation $PT Eval Moderate Complexity: 1 Procedure     PT G Codes:        5:48 PM, 04-13-2016 Etta Grandchild, PT, DPT Physical Therapist at [place] [phone] (office)

## 2016-03-22 NOTE — Progress Notes (Signed)
Subjective:  Patient overall is feeling better. His leg swelling is going down. If he states he is making plenty of urine. Presently he denies any difficulty breathing.  Objective: Vital signs in last 24 hours: Temp:  [98.1 F (36.7 C)-98.2 F (36.8 C)] 98.1 F (36.7 C) (10/26 0624) Pulse Rate:  [88-100] 88 (10/26 0624) Resp:  [16] 16 (10/26 0624) BP: (97-105)/(53-54) 105/53 (10/26 0624) SpO2:  [100 %] 100 % (10/26 0624) Weight change:   Intake/Output from previous day: 10/25 0701 - 10/26 0700 In: 483 [P.O.:480; I.V.:3] Out: 5550 [Urine:5550] Intake/Output this shift: No intake/output data recorded.   EXAM: General appearance:  Alert, cooperative, in no apparent distress Resp:  Diminished breath sounds, but clear bilaterally Cardio:  RRR without murmur or rub GI: + BS, soft, nontender Extremities:  2+ edema bilaterally  Lab Results: No results for input(s): WBC, HGB, HCT, PLT in the last 72 hours. BMET:   Recent Labs  03/20/16 0556 03/21/16 0612  NA  --  134*  K  --  2.7*  CL  --  95*  CO2  --  32  GLUCOSE  --  86  BUN  --  56*  CREATININE  --  1.65*  CALCIUM  --  8.3*  ALBUMIN 2.3* 2.4*   No results for input(s): PTH in the last 72 hours. Iron Studies: No results for input(s): IRON, TIBC, TRANSFERRIN, FERRITIN in the last 72 hours.  Studies/Results: No results found.   Assessment: 1. Acute kidney injury superimposed on chronic - History and function continue to improve. His renal function seems to be reaching his baseline. 2. Anasarca - patient is on metolazone 2.5 mg by mouth once a day, Aldactone 50 mg by mouth daily and Demadex 100 mg by mouth daily. Still he has significant sign of fluid overload patient had 5500 mL of urine output the last 24 hours. 3. Hypokalemia - Secondary to diuretics and fluid loss, his potassium has fallen to 2. From yesterday. Presently his blood work is pending. He was oh and additional IV potassium was ordered patient did receive  the last 24 hours because of lack of access. Pa 4. Hypervolemic hyponatremia - His sodium has corrected. 5. Anemia - Most likely from GI bleeding, his hemoglobin is low but stable. 6. Obesity 7. History of liver cirrhosis    Plan:  We'll check renal panel today and tomorrow            We'll continue his present treatment.   LOS: 16 days   Linna Thebeau S 03/22/2016,8:22 AM

## 2016-03-22 NOTE — Progress Notes (Signed)
Notified Dr. Lowanda Foster of the patients potassium level 2.9 and mag level 1.4.  New orders given and followed.

## 2016-03-23 LAB — BASIC METABOLIC PANEL
ANION GAP: 5 (ref 5–15)
BUN: 62 mg/dL — ABNORMAL HIGH (ref 6–20)
CHLORIDE: 95 mmol/L — AB (ref 101–111)
CO2: 32 mmol/L (ref 22–32)
Calcium: 8.4 mg/dL — ABNORMAL LOW (ref 8.9–10.3)
Creatinine, Ser: 1.95 mg/dL — ABNORMAL HIGH (ref 0.61–1.24)
GFR calc non Af Amer: 40 mL/min — ABNORMAL LOW (ref >60)
GFR, EST AFRICAN AMERICAN: 47 mL/min — AB (ref >60)
GLUCOSE: 86 mg/dL (ref 65–99)
Potassium: 3.1 mmol/L — ABNORMAL LOW (ref 3.5–5.1)
Sodium: 132 mmol/L — ABNORMAL LOW (ref 135–145)

## 2016-03-23 LAB — IRON AND TIBC
Iron: 16 ug/dL — ABNORMAL LOW (ref 45–182)
SATURATION RATIOS: 7 % — AB (ref 17.9–39.5)
TIBC: 244 ug/dL — AB (ref 250–450)
UIBC: 228 ug/dL

## 2016-03-23 LAB — FERRITIN: FERRITIN: 41 ng/mL (ref 24–336)

## 2016-03-23 MED ORDER — TORSEMIDE 20 MG PO TABS: 20.0000 mg | ORAL_TABLET | Freq: Every day | ORAL | 3 refills | Status: DC

## 2016-03-23 MED ORDER — POTASSIUM CHLORIDE CRYS ER 20 MEQ PO TBCR
40.0000 meq | EXTENDED_RELEASE_TABLET | ORAL | Status: AC
  Administered 2016-03-23 (×2): 40 meq via ORAL
  Filled 2016-03-23 (×2): qty 2

## 2016-03-23 NOTE — Progress Notes (Signed)
Subjective: Interval History: Patient offers no complaints. He denies any difficulty breathing.  Objective: Vital signs in last 24 hours: Temp:  [97.7 F (36.5 C)-98.1 F (36.7 C)] 98.1 F (36.7 C) (10/27 0500) Pulse Rate:  [84-111] 84 (10/27 0500) Resp:  [16-19] 19 (10/27 0500) BP: (93-111)/(48-63) 99/51 (10/27 0500) SpO2:  [100 %] 100 % (10/27 0500) Weight:  [147.2 kg (324 lb 8 oz)] 147.2 kg (324 lb 8 oz) (10/27 0500) Weight change: -1.588 kg (-3 lb 8 oz)  Intake/Output from previous day: 10/26 0701 - 10/27 0700 In: 720 [P.O.:720] Out: 5075 [Urine:5075] Intake/Output this shift: No intake/output data recorded.  General appearance: alert, cooperative and no distress Resp: diminished breath sounds bilaterally GI: Distended, nontender and difficult to palpate organomegaly Extremities: edema He has 2-3+ edema  Lab Results:  Recent Labs  03/22/16 1520  WBC 6.8  HGB 7.2*  HCT 23.2*  PLT 120*   BMET:   Recent Labs  03/21/16 0612 03/22/16 0845  NA 134* 132*  K 2.7* 2.9*  CL 95* 94*  CO2 32 31  GLUCOSE 86 77  BUN 56* 59*  CREATININE 1.65* 1.73*  CALCIUM 8.3* 8.6*   No results for input(s): PTH in the last 72 hours. Iron Studies: No results for input(s): IRON, TIBC, TRANSFERRIN, FERRITIN in the last 72 hours.  Studies/Results: No results found.  I have reviewed the patient's current medications.  Assessment/Plan: Problem #1 acute kidney injury superimposed on chronic. His renal function remains stable. Most likely this is his baseline. Problem #2 anasarca: Presently on Demadex, metolazone, Aldactone and patient has 5000 mL of urine output the last 24 hours. Patient has lost more than 50 pounds since his admission. Problem #3 hypokalemia: Most likely from diuretics. His potassium was low and patient is on supplement. At this moment his blood work is not back from this morning. Problem #4 height are probably make hyponatremia: His sodium is 132  improving Problem #5 anemia: His hemoglobin is low but stable. Problem #6 obesity Problem #7 history of liver cirrhosis  Plan: 1] We'll DC metolazone 2] we'll check iron studies today 3 will check renal panel in the morning 4] once his potassium is corrected and stabilized we'll send him with potassium supplement, Aldactone and Demadex   LOS: 17 days   Lynnea Vandervoort S 03/23/2016,8:28 AM

## 2016-03-23 NOTE — Care Management Note (Signed)
Case Management Note  Patient Details  Name: Antonio [name] MRN: [id number] Date of Birth: 1971/04/04 Expected Discharge Date:   03/23/2016               Expected Discharge Plan:  Home/Self Care  In-House Referral:  NA  Discharge planning Services  CM Consult  Post Acute Care Choice:  NA Choice offered to:  NA  Status of Service:  Completed, signed off    Additional Comments: anticipate DC home today if K is corrected. Pt has refused referral for OP PT eval at this time. Pt will DC home with self care. NO CM needs.   Sherald Barge, RN 03/23/2016, 8:22 AM

## 2016-03-23 NOTE — Progress Notes (Signed)
PROGRESS NOTE  Antonio [name] [id number] DOB: Sep 12, 1970 DOA: 03/06/2016 PCP: Deloria Lair, MD  Brief Narrative: 45 year old man with end-stage liver disease, chronic kidney disease, morbid obesity, who presented with increasing swelling and anuria. Admitted for decompensated cirrhosis, acute kidney injury. Seen by nephrology and gastroenterology consultation. Developed GI bleed, underwent EGD and colonoscopy, then had further bleeding, possibly related to polypectomy versus rectal varices. Diuresis was difficult and managed by nephrology. Hospitalization was prolonged by difficulty diuresing as well as need for closely monitoring hemoglobin getting slow chronic GI bleed as followed by GI.  Assessment/Plan: 1. 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 Stable currently. Anasarca improving with excellent daily diuresis. Urine output 5 L overnight. -56 L since admission. 2. Hypokalemia, improving with aggressive replacement. 3. Hypomagnesemia, repleted. 4. Acute blood loss anemia secondary to GI bleed superimposed on anemia of chronic kidney disease. Hemoglobin stable. 5. Chronic kidney disease stage III, remained stable. 6. Morbid obesity 7. Possible atrial flutter. Anticoagulation contraindicated. Has been in sinus rhythm.   A systematic. Continue to do well, excellent diuresis with oral agents. Potassium improved.  Discussed with Dr. Lowanda Foster, okay for discharge home, he recommends Demadex 40 mg daily, continue Aldactone and potassium supplementation. He will arrange follow-up in his office in 2 weeks.  DVT prophylaxis: SCDs Code Status: full code Family Communication: none Disposition Plan: home?  [name], MD  [facility] Direct contact: [phone] --Via amion app OR  --[email or web address]; password [email or web address]  7PM-7AM  contact night coverage as above 03/23/2016, 1:09 PM  LOS: 17 days   Consultants:  Nephrology  Gastroenterology  Procedures:  Colonoscopy 10/12 Impression:               - One 8 mm polyp in the descending colon, removed                            with a hot snare. Resected and retrieved.                           - One 4 mm polyp in the cecum, removed with a cold                            snare. Resected and retrieved.                           Rectal varices. Melanosis coli. Abnormal left colon                            mucosa status post biopsy.  EGD Impression:               - Grade II esophageal varices .                           - Portal hypertensive gastropathy.                           - Normal duodenal bulb and second portion of the                            duodenum.                           -  No specimens collected. I suspect patient bled                            from his lower GI tract. See colonoscopy report.                            Follow hemoglobin.   Interval history/Subjective:   Feels fine today. No complaints.  Objective: Vitals:   03/22/16 1400 03/22/16 2252 03/22/16 2326 03/23/16 0500  BP: 111/63 (!) 93/48 (!) 99/55 (!) 99/51  Pulse: 90 (!) 111 85 84  Resp: 16 18  19   Temp: 98.1 F (36.7 C) 97.7 F (36.5 C)  98.1 F (36.7 C)  TempSrc:  Oral  Oral  SpO2: 100% 100%  100%  Weight:    (!) 147.2 kg (324 lb 8 oz)  Height:        Intake/Output Summary (Last 24 hours) at 03/23/16 1309 Last data filed at 03/23/16 0951  Gross per 24 hour  Intake              240 ml  Output             5375 ml  Net            -5135 ml     Filed Weights   03/13/16 1020 03/22/16 0624 03/23/16 0500  Weight: (!) 170.9 kg (376 lb 12.8 oz) (!) 148.8 kg (328 lb) (!) 147.2 kg (324 lb 8 oz)    Exam: Constitutional:   Calm and comfortable Respiratory:   CTA bilaterally, no w/r/r.   Normal resp effort Cardiovascular:   RRR no m/r/g  2+ bilateral LE  edema Psychiatric:   Grossly normal mood and affect, judgement and insight    I have personally reviewed following labs and imaging studies: Potassium 3.1, improved BUN and creatinine stable 62/1.95.  Scheduled Meds: . feeding supplement (PRO-STAT SUGAR FREE 64)  45 mL Oral TID  . folic acid  1 mg Oral Daily  . lactulose  20 g Oral TID  . nadolol  20 mg Oral QPC supper  . nadolol  40 mg Oral Daily  . pantoprazole  40 mg Oral BID AC  . potassium chloride  40 mEq Oral Q4H  . rifaximin  550 mg Oral BID  . sodium chloride flush  3 mL Intravenous Q12H  . spironolactone  50 mg Oral Daily  . thiamine  100 mg Oral Daily  . torsemide  60 mg Oral Daily   Continuous Infusions:    Principal Problem:   Decompensation of cirrhosis of liver (HCC) Active Problems:   Anemia   Anasarca   Thrombocytopenia (HCC)   Acute renal failure (HCC)   Lower extremity edema   Hyponatremia   Hyperglycemia   Protein-calorie malnutrition, severe (HCC)   CHF (congestive heart failure) (HCC)   Rectal bleeding   Hypokalemia   Elevated brain natriuretic peptide (BNP) level   ATN (acute tubular necrosis) (HCC)   LOS: 17 days

## 2016-03-23 NOTE — Discharge Summary (Signed)
Physician Discharge Summary  Antonio [name] [id number] DOB: [birth year] DOA: 03/06/2016  PCP: Deloria Lair, MD  Admit date: 03/06/2016 Discharge date: 03/23/2016  Recommendations for Outpatient Follow-up:  1. End-stage liver disease 2. Anasarca, diuretic regimen adjusted this hospitalization 3. GI bleed   Follow-up Information    [name], MD Follow up on 04/11/2016.   Specialty:  Gastroenterology Why:  at 3:00 pm Contact information: [place]  [address] [phone]        [facility] S, MD. Schedule an appointment as soon as possible for a visit in 2 week(s).   Specialty:  Nephrology Contact information: [address] [address] [phone]        [facility], MD .   Specialty:  Nephrology Contact information: [address] [place] [id number] [phone]          Discharge Diagnoses:  1. 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 Anasarca 3. Acute blood loss anemia secondary to GI bleed 4. GI bleed 5. Chronic kidney disease stage III 6. Morbid obesity  Discharge Condition: improved  Disposition: home  Diet recommendation: low salt  Filed Weights   03/13/16 1020 03/22/16 0624 03/23/16 0500  Weight: (!) 170.9 kg (376 lb 12.8 oz) (!) 148.8 kg (328 lb) (!) 147.2 kg (324 lb 8 oz)    History of present illness:  45 year old man with end-stage liver disease, chronic kidney disease, morbid obesity, who presented with increasing swelling and anuria. Admitted for decompensated cirrhosis, acute kidney injury.   Hospital Course:  Seen by nephrology and gastroenterology consultation. Developed GI bleed, underwent EGD and colonoscopy, then had further bleeding, possibly related to polypectomy versus rectal varices. This eventually stabilized. Renal function gradually improved with nephrology management. Diuresis was difficult and managed by nephrology. Hospitalization was  prolonged by difficulty diuresing as well as need for closely monitoring hemoglobin given slow chronic GI bleed as followed by GI. Hospitalization also prolonged by hypokalemia. Patient continues to have excellent diuresis on oral agents and has remained asymptomatic. With improvement in potassium he is stable for discharge home. Individual issues as below.  1. 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 Stable currently. Anasarca improving with excellent daily diuresis. Urine output 5 L overnight. -56 L since admission. 2. Hypokalemia, improving with aggressive replacement. 3. Hypomagnesemia, repleted. 4. Acute blood loss anemia secondary to GI bleed superimposed on anemia of chronic kidney disease. Hemoglobin stable. 5. Chronic kidney disease stage III, remained stable. 6. Morbid obesity 7. Possible atrial flutter. Anticoagulation contraindicated. Has been in sinus rhythm.  Consultants:  Nephrology  Gastroenterology  Procedures:  Colonoscopy 10/12 Impression: - One 8 mm polyp in the descending colon, removed  with a hot snare. Resected and retrieved. - One 4 mm polyp in the cecum, removed with a cold  snare. Resected and retrieved. Rectal varices. Melanosis coli. Abnormal left colon  mucosa status post biopsy.  EGD Impression: - Grade II esophageal varices . - Portal hypertensive gastropathy. - Normal duodenal bulb and second portion of the  duodenum. - No specimens collected. I suspect patient bled  from his lower GI tract. See colonoscopy report.   Follow hemoglobin.  Discharge Instructions  Discharge Instructions    Diet - low sodium heart healthy    Complete by:  As directed    Discharge instructions    Complete by:  As directed    Call your physician or seek immediate medical  attention for pain, difficulty urinating, weight gain, increased swelling or worsening of condition.   Increase activity slowly    Complete by:  As directed        Medication List    STOP taking these medications   furosemide 40 MG tablet Commonly known as:  LASIX     TAKE these medications   CALCIUM 600+D 600-800 MG-UNIT Tabs Generic drug:  Calcium Carb-Cholecalciferol Take 1 tablet by mouth 2 (two) times daily.   DANDELION ROOT PO Take 1 tablet by mouth daily.   folic acid 1 MG tablet Commonly known as:  FOLVITE Take 1 tablet (1 mg total) by mouth daily.   lactulose 10 GM/15ML solution Commonly known as:  CHRONULAC Take 30 mLs (20 g total) by mouth 3 (three) times daily.   magnesium oxide 400 (241.3 Mg) MG tablet Commonly known as:  MAG-OX Take 1 tablet (400 mg total) by mouth 2 (two) times daily.   MILK THISTLE PO Take 1 tablet by mouth daily.   nadolol 40 MG tablet Commonly known as:  CORGARD Take 1 tablet (40 mg total) by mouth 2 (two) times daily.   pantoprazole 40 MG tablet Commonly known as:  PROTONIX Take 1 tablet (40 mg total) by mouth 2 (two) times daily before a meal.   potassium chloride SA 20 MEQ tablet Commonly known as:  K-DUR,KLOR-CON Take 20 mEq by mouth 2 (two) times daily.   rifaximin 550 MG Tabs tablet Commonly known as:  XIFAXAN Take 1 tablet (550 mg total) by mouth 2 (two) times daily.   spironolactone 100 MG tablet Commonly known as:  ALDACTONE Take 0.5 tablets (50 mg total) by mouth daily. What changed:  how much to take   thiamine 100 MG tablet Take 1 tablet (100 mg total) by mouth daily.   torsemide 20 MG tablet Commonly known as:  DEMADEX Take 1 tablet (20 mg  total) by mouth daily. For treatment of swelling. What changed:  when to take this  additional instructions      No Known Allergies  The results of significant diagnostics from this hospitalization (including imaging, microbiology, ancillary and laboratory) are listed below for reference.    Significant Diagnostic Studies: Dg Chest 2 View  Result Date: 03/06/2016 CLINICAL DATA:  Dyspnea. EXAM: CHEST  2 VIEW COMPARISON:  01/09/2016 chest radiograph. FINDINGS: Stable cardiomediastinal silhouette with mild cardiomegaly. No pneumothorax. No pleural effusion. Mild pulmonary edema. No acute consolidative airspace disease. IMPRESSION: Mild congestive heart failure. Electronically Signed   By: Ilona Sorrel M.D.   On: 03/06/2016 13:26   US Renal  Result Date: 03/07/2016 CLINICAL DATA:  Recently diagnosed with acute tubular necrosis. History of CHF, alcoholic hepatitis and cirrhosis. EXAM: RENAL / URINARY TRACT ULTRASOUND COMPLETE COMPARISON:  None in PACs FINDINGS: Right Kidney: Length: 13.3 cm. The renal cortical echotexture is approximately equal to that of the adjacent liver. There is no cortical atrophy. There is no hydronephrosis. No stones are evident. Left Kidney: Length: 12.8 cm. The renal cortical echotexture is similar to that on the right. There is no hydronephrosis. No cortical atrophy. No evidence of stones. Bladder: The urinary bladder is decompressed with a Foley catheter. IMPRESSION: Slightly increased renal cortical echotexture as compared to the liver though this may be spurring this given the patient's known cirrhosis. No evidence of cortical atrophy or obstruction. Nondistended urinary bladder with Foley catheter present. Electronically Signed   By: David  Martinique M.D.   On: 03/07/2016 11:36   Dg Chest [facility]  1 View  Result Date: 03/11/2016 CLINICAL DATA:  PICC line placement. EXAM: PORTABLE CHEST 1 VIEW COMPARISON:  March 09, 2006 FINDINGS: Stable cardiomegaly. The hila and  mediastinum are unremarkable. A new right PICC line has been placed. It is difficult to see the distal tip but I believe it terminates in the central SVC. No focal infiltrate or overt edema. IMPRESSION: 1. New right PICC line. The distal tip is difficult to visualize but appears to terminate centrally, likely within the SVC. Electronically Signed   By: Dorise Bullion III M.D   On: 03/11/2016 15:30   Dg Chest Port 1 View  Result Date: 03/09/2016 CLINICAL DATA:  Hypoxia and rectal bleeding EXAM: PORTABLE CHEST 1 VIEW COMPARISON:  Chest radiograph 03/06/2016 FINDINGS: Cardiac silhouette remains enlarged. There is slightly improved aeration of the lung bases with associated slight decrease in pulmonary edema. No pneumothorax or sizable pleural effusion. No focal airspace consolidation. IMPRESSION: Slightly improved mild pulmonary edema. Electronically Signed   By: Ulyses Jarred M.D.   On: 03/09/2016 13:52   Labs: Basic Metabolic Panel:  Recent Labs Lab 03/17/16 0552 03/18/16 0601 03/19/16 0617 03/21/16 0612 03/22/16 0845 03/23/16 0831  NA 133* 136 136 134* 132* 132*  K 3.2* 2.7* 2.5* 2.7* 2.9* 3.1*  CL 105 106 101 95* 94* 95*  CO2 20* 25 29 32 31 32  GLUCOSE 87 89 98 86 77 86  BUN 66* 65* 60* 56* 59* 62*  CREATININE 2.70* 2.02* 1.54* 1.65* 1.73* 1.95*  CALCIUM 8.6* 8.7* 8.7* 8.3* 8.6* 8.4*  MG  --  1.9  --   --  1.4*  --   PHOS 5.7*  --  4.3 4.0  --   --    Liver Function Tests:  Recent Labs Lab 03/18/16 0601 03/19/16 0617 03/20/16 0556 03/21/16 0612 03/22/16 1520  AST 56*  --  65*  --  74*  ALT 19  --  20  --  22  ALKPHOS 127*  --  108  --  126  BILITOT 3.7*  --  3.7*  --  3.6*  PROT 6.8  --  6.7  --  6.7  ALBUMIN 2.5* 2.3* 2.3* 2.4* 2.3*   CBC:  Recent Labs Lab 03/18/16 0601 03/19/16 0617 03/22/16 1520  WBC 7.9 7.9 6.8  HGB 7.0* 7.3* 7.2*  HCT 21.5* 22.4* 23.2*  MCV 92.7 92.9 94.3  PLT 104* 109* 120*     Recent Labs  12/26/15 2230 01/03/16 2200  03/06/16 1300  BNP 30.0 274.0* 545.0*    Principal Problem:   Decompensation of cirrhosis of liver (HCC) Active Problems:   Anemia   Anasarca   Thrombocytopenia (HCC)   Acute renal failure (HCC)   Lower extremity edema   Hyponatremia   Hyperglycemia   Protein-calorie malnutrition, severe (HCC)   CHF (congestive heart failure) (HCC)   Rectal bleeding   Hypokalemia   Elevated brain natriuretic peptide (BNP) level   ATN (acute tubular necrosis) (HCC)   Time coordinating discharge: 35 minutes  Signed:  [name], MD [facility] 03/23/2016, 3:13 PM

## 2016-03-23 NOTE — Progress Notes (Signed)
Patient with orders to be discharge home. Discharge instructions given, patient verbalized understanding. Prescriptions given. Patient stable. Patient left in private vehicle with spouse.  

## 2016-03-25 ENCOUNTER — Emergency Department (HOSPITAL_COMMUNITY): Payer: BLUE CROSS/BLUE SHIELD

## 2016-03-25 ENCOUNTER — Inpatient Hospital Stay (HOSPITAL_COMMUNITY)
Admission: EM | Admit: 2016-03-25 | Discharge: 2016-03-29 | DRG: 432 | Disposition: A | Discharge status: Home / Self Care | Payer: BLUE CROSS/BLUE SHIELD | Attending: Internal Medicine | Admitting: Internal Medicine

## 2016-03-25 ENCOUNTER — Encounter (HOSPITAL_COMMUNITY): Payer: Self-pay | Admitting: *Deleted

## 2016-03-25 DIAGNOSIS — R32 Unspecified urinary incontinence: Secondary | ICD-10-CM | POA: Diagnosis present

## 2016-03-25 DIAGNOSIS — F1020 Alcohol dependence, uncomplicated: Secondary | ICD-10-CM | POA: Diagnosis present

## 2016-03-25 DIAGNOSIS — K766 Portal hypertension: Secondary | ICD-10-CM | POA: Diagnosis present

## 2016-03-25 DIAGNOSIS — K7030 Alcoholic cirrhosis of liver without ascites: Secondary | ICD-10-CM | POA: Diagnosis present

## 2016-03-25 DIAGNOSIS — K7040 Alcoholic hepatic failure without coma: Secondary | ICD-10-CM | POA: Diagnosis present

## 2016-03-25 DIAGNOSIS — R601 Generalized edema: Secondary | ICD-10-CM | POA: Diagnosis present

## 2016-03-25 DIAGNOSIS — J9691 Respiratory failure, unspecified with hypoxia: Secondary | ICD-10-CM | POA: Diagnosis present

## 2016-03-25 DIAGNOSIS — R569 Unspecified convulsions: Secondary | ICD-10-CM | POA: Diagnosis present

## 2016-03-25 DIAGNOSIS — I4892 Unspecified atrial flutter: Secondary | ICD-10-CM | POA: Diagnosis present

## 2016-03-25 DIAGNOSIS — K7460 Unspecified cirrhosis of liver: Secondary | ICD-10-CM | POA: Diagnosis present

## 2016-03-25 DIAGNOSIS — D62 Acute posthemorrhagic anemia: Secondary | ICD-10-CM | POA: Diagnosis present

## 2016-03-25 DIAGNOSIS — M6281 Muscle weakness (generalized): Secondary | ICD-10-CM

## 2016-03-25 DIAGNOSIS — Z515 Encounter for palliative care: Secondary | ICD-10-CM | POA: Diagnosis not present

## 2016-03-25 DIAGNOSIS — Y900 Blood alcohol level of less than 20 mg/100 ml: Secondary | ICD-10-CM | POA: Diagnosis present

## 2016-03-25 DIAGNOSIS — K3189 Other diseases of stomach and duodenum: Secondary | ICD-10-CM | POA: Diagnosis present

## 2016-03-25 DIAGNOSIS — I248 Other forms of acute ischemic heart disease: Secondary | ICD-10-CM | POA: Diagnosis present

## 2016-03-25 DIAGNOSIS — K7682 Hepatic encephalopathy: Secondary | ICD-10-CM | POA: Diagnosis present

## 2016-03-25 DIAGNOSIS — Z79899 Other long term (current) drug therapy: Secondary | ICD-10-CM

## 2016-03-25 DIAGNOSIS — D689 Coagulation defect, unspecified: Secondary | ICD-10-CM | POA: Diagnosis present

## 2016-03-25 DIAGNOSIS — R6510 Systemic inflammatory response syndrome (SIRS) of non-infectious origin without acute organ dysfunction: Secondary | ICD-10-CM

## 2016-03-25 DIAGNOSIS — N183 Chronic kidney disease, stage 3 (moderate): Secondary | ICD-10-CM | POA: Diagnosis present

## 2016-03-25 DIAGNOSIS — T68XXXA Hypothermia, initial encounter: Secondary | ICD-10-CM

## 2016-03-25 DIAGNOSIS — Z6841 Body Mass Index (BMI) 40.0 and over, adult: Secondary | ICD-10-CM

## 2016-03-25 DIAGNOSIS — K7290 Hepatic failure, unspecified without coma: Secondary | ICD-10-CM | POA: Diagnosis present

## 2016-03-25 DIAGNOSIS — N179 Acute kidney failure, unspecified: Secondary | ICD-10-CM | POA: Diagnosis present

## 2016-03-25 DIAGNOSIS — I4891 Unspecified atrial fibrillation: Secondary | ICD-10-CM | POA: Diagnosis present

## 2016-03-25 DIAGNOSIS — D631 Anemia in chronic kidney disease: Secondary | ICD-10-CM | POA: Diagnosis present

## 2016-03-25 DIAGNOSIS — D696 Thrombocytopenia, unspecified: Secondary | ICD-10-CM | POA: Diagnosis present

## 2016-03-25 DIAGNOSIS — K7031 Alcoholic cirrhosis of liver with ascites: Secondary | ICD-10-CM | POA: Diagnosis present

## 2016-03-25 DIAGNOSIS — R4182 Altered mental status, unspecified: Secondary | ICD-10-CM

## 2016-03-25 DIAGNOSIS — J9690 Respiratory failure, unspecified, unspecified whether with hypoxia or hypercapnia: Secondary | ICD-10-CM

## 2016-03-25 DIAGNOSIS — Z87891 Personal history of nicotine dependence: Secondary | ICD-10-CM

## 2016-03-25 DIAGNOSIS — Z95828 Presence of other vascular implants and grafts: Secondary | ICD-10-CM

## 2016-03-25 DIAGNOSIS — Z7189 Other specified counseling: Secondary | ICD-10-CM | POA: Diagnosis not present

## 2016-03-25 LAB — HEMOGLOBIN AND HEMATOCRIT, BLOOD
HEMATOCRIT: 25.5 % — AB (ref 39.0–52.0)
HEMOGLOBIN: 8.3 g/dL — AB (ref 13.0–17.0)

## 2016-03-25 LAB — TROPONIN I
Troponin I: 0.04 ng/mL (ref <0.03)
Troponin I: 0.04 ng/mL (ref <0.03)

## 2016-03-25 LAB — URINALYSIS, ROUTINE W REFLEX MICROSCOPIC
Bilirubin Urine: NEGATIVE
GLUCOSE, UA: NEGATIVE mg/dL
KETONES UR: NEGATIVE mg/dL
Leukocytes, UA: NEGATIVE
Nitrite: NEGATIVE
PH: 7.5 (ref 5.0–8.0)
PROTEIN: NEGATIVE mg/dL
Specific Gravity, Urine: 1.01 (ref 1.005–1.030)

## 2016-03-25 LAB — BLOOD GAS, ARTERIAL
ACID-BASE EXCESS: 3.4 mmol/L — AB (ref 0.0–2.0)
BICARBONATE: 27.8 mmol/L (ref 20.0–28.0)
BICARBONATE: 29.8 mmol/L — AB (ref 20.0–28.0)
DRAWN BY: 105551
DRAWN BY: 22223
FIO2: 100
FIO2: 40
MECHVT: 650 mL
O2 SAT: 99.5 %
O2 Saturation: 99.9 %
PATIENT TEMPERATURE: 35.5
PATIENT TEMPERATURE: 36.5
PCO2 ART: 30.9 mmHg — AB (ref 32.0–48.0)
PCO2 ART: 33.9 mmHg (ref 32.0–48.0)
PEEP/CPAP: 5 cmH2O
PH ART: 7.535 — AB (ref 7.350–7.450)
PH ART: 7.535 — AB (ref 7.350–7.450)
PO2 ART: 360 mmHg — AB (ref 83.0–108.0)
PO2 ART: 157 mmHg — AB (ref 83.0–108.0)
RATE: 14 resp/min

## 2016-03-25 LAB — POC OCCULT BLOOD, ED: Fecal Occult Bld: POSITIVE — AB

## 2016-03-25 LAB — COMPREHENSIVE METABOLIC PANEL
ALBUMIN: 2.7 g/dL — AB (ref 3.5–5.0)
ALT: 30 U/L (ref 17–63)
ANION GAP: 11 (ref 5–15)
AST: 94 U/L — ABNORMAL HIGH (ref 15–41)
Alkaline Phosphatase: 157 U/L — ABNORMAL HIGH (ref 38–126)
BILIRUBIN TOTAL: 4.3 mg/dL — AB (ref 0.3–1.2)
BUN: 67 mg/dL — ABNORMAL HIGH (ref 6–20)
CO2: 26 mmol/L (ref 22–32)
Calcium: 8.9 mg/dL (ref 8.9–10.3)
Chloride: 99 mmol/L — ABNORMAL LOW (ref 101–111)
Creatinine, Ser: 2.55 mg/dL — ABNORMAL HIGH (ref 0.61–1.24)
GFR, EST AFRICAN AMERICAN: 34 mL/min — AB (ref >60)
GFR, EST NON AFRICAN AMERICAN: 29 mL/min — AB (ref >60)
GLUCOSE: 109 mg/dL — AB (ref 65–99)
POTASSIUM: 4.2 mmol/L (ref 3.5–5.1)
Sodium: 136 mmol/L (ref 135–145)
TOTAL PROTEIN: 8 g/dL (ref 6.5–8.1)

## 2016-03-25 LAB — CBC WITH DIFFERENTIAL/PLATELET
BASOS ABS: 0.1 10*3/uL (ref 0.0–0.1)
Basophils Relative: 1 %
Eosinophils Absolute: 1.1 10*3/uL — ABNORMAL HIGH (ref 0.0–0.7)
Eosinophils Relative: 15 %
HEMATOCRIT: 28.4 % — AB (ref 39.0–52.0)
HEMOGLOBIN: 9.2 g/dL — AB (ref 13.0–17.0)
LYMPHS PCT: 23 %
Lymphs Abs: 1.7 10*3/uL (ref 0.7–4.0)
MCH: 29.2 pg (ref 26.0–34.0)
MCHC: 32.4 g/dL (ref 30.0–36.0)
MCV: 90.2 fL (ref 78.0–100.0)
Monocytes Absolute: 1 10*3/uL (ref 0.1–1.0)
Monocytes Relative: 13 %
NEUTROS ABS: 3.6 10*3/uL (ref 1.7–7.7)
NEUTROS PCT: 48 %
Platelets: 172 10*3/uL (ref 150–400)
RBC: 3.15 MIL/uL — AB (ref 4.22–5.81)
RDW: 19.4 % — ABNORMAL HIGH (ref 11.5–15.5)
WBC: 7.5 10*3/uL (ref 4.0–10.5)

## 2016-03-25 LAB — RAPID URINE DRUG SCREEN, HOSP PERFORMED
AMPHETAMINES: NOT DETECTED
BARBITURATES: NOT DETECTED
BENZODIAZEPINES: NOT DETECTED
Cocaine: NOT DETECTED
Opiates: NOT DETECTED
Tetrahydrocannabinol: NOT DETECTED

## 2016-03-25 LAB — ETHANOL: ALCOHOL ETHYL (B): 12 mg/dL — AB (ref <5)

## 2016-03-25 LAB — GLUCOSE, CAPILLARY: GLUCOSE-CAPILLARY: 90 mg/dL (ref 65–99)

## 2016-03-25 LAB — TYPE AND SCREEN
ABO/RH(D): O NEG
Antibody Screen: NEGATIVE

## 2016-03-25 LAB — URINE MICROSCOPIC-ADD ON
Bacteria, UA: NONE SEEN
Squamous Epithelial / LPF: NONE SEEN
WBC UA: NONE SEEN WBC/hpf (ref 0–5)

## 2016-03-25 LAB — MAGNESIUM: MAGNESIUM: 1.7 mg/dL (ref 1.7–2.4)

## 2016-03-25 LAB — PROTIME-INR
INR: 2.02
Prothrombin Time: 23.2 seconds — ABNORMAL HIGH (ref 11.4–15.2)

## 2016-03-25 LAB — AMMONIA: Ammonia: 307 umol/L — ABNORMAL HIGH (ref 9–35)

## 2016-03-25 LAB — APTT: aPTT: 48 seconds — ABNORMAL HIGH (ref 24–36)

## 2016-03-25 LAB — LACTIC ACID, PLASMA
LACTIC ACID, VENOUS: 3.8 mmol/L — AB (ref 0.5–1.9)
Lactic Acid, Venous: 2.4 mmol/L (ref 0.5–1.9)
Lactic Acid, Venous: 2.3 mmol/L (ref 0.5–1.9)

## 2016-03-25 LAB — BRAIN NATRIURETIC PEPTIDE: B Natriuretic Peptide: 176 pg/mL — ABNORMAL HIGH (ref 0.0–100.0)

## 2016-03-25 MED ORDER — FAMOTIDINE IN NACL 20-0.9 MG/50ML-% IV SOLN
20.0000 mg | INTRAVENOUS | Status: DC
  Administered 2016-03-25 – 2016-03-27 (×3): 20 mg via INTRAVENOUS
  Filled 2016-03-25 (×3): qty 50

## 2016-03-25 MED ORDER — LORAZEPAM 2 MG/ML IJ SOLN: 0.0000 mg | Freq: Four times a day (QID) | INTRAMUSCULAR | Status: DC

## 2016-03-25 MED ORDER — ETOMIDATE 2 MG/ML IV SOLN
40.0000 mg | Freq: Once | INTRAVENOUS | Status: AC
  Administered 2016-03-25: 40 mg via INTRAVENOUS

## 2016-03-25 MED ORDER — VITAMIN K1 10 MG/ML IJ SOLN
INTRAMUSCULAR | Status: AC
  Filled 2016-03-25: qty 1

## 2016-03-25 MED ORDER — RIFAXIMIN 550 MG PO TABS
550.0000 mg | ORAL_TABLET | Freq: Two times a day (BID) | ORAL | Status: DC
  Administered 2016-03-25 – 2016-03-29 (×8): 550 mg
  Filled 2016-03-25 (×9): qty 1

## 2016-03-25 MED ORDER — SODIUM CHLORIDE 0.9% FLUSH
3.0000 mL | Freq: Two times a day (BID) | INTRAVENOUS | Status: DC
  Administered 2016-03-25 – 2016-03-29 (×6): 3 mL via INTRAVENOUS

## 2016-03-25 MED ORDER — SODIUM CHLORIDE 0.9 % IV SOLN
INTRAVENOUS | Status: DC
Start: 2016-03-25 — End: 2016-03-25
  Administered 2016-03-25: 05:00:00 via INTRAVENOUS

## 2016-03-25 MED ORDER — VANCOMYCIN HCL IN DEXTROSE 1-5 GM/200ML-% IV SOLN
1000.0000 mg | INTRAVENOUS | Status: AC
  Administered 2016-03-25 (×2): 1000 mg via INTRAVENOUS
  Filled 2016-03-25 (×2): qty 200

## 2016-03-25 MED ORDER — MIDAZOLAM HCL 2 MG/2ML IJ SOLN
1.0000 mg | INTRAMUSCULAR | Status: DC | PRN
  Administered 2016-03-25 – 2016-03-26 (×4): 1 mg via INTRAVENOUS
  Filled 2016-03-25 (×4): qty 2

## 2016-03-25 MED ORDER — LACTULOSE 10 GM/15ML PO SOLN
30.0000 g | ORAL | Status: DC
  Administered 2016-03-25 (×3): 30 g
  Filled 2016-03-25 (×3): qty 60

## 2016-03-25 MED ORDER — METOPROLOL TARTRATE 5 MG/5ML IV SOLN
5.0000 mg | Freq: Four times a day (QID) | INTRAVENOUS | Status: DC
  Administered 2016-03-26 – 2016-03-27 (×7): 5 mg via INTRAVENOUS
  Filled 2016-03-25 (×9): qty 5

## 2016-03-25 MED ORDER — FAMOTIDINE 40 MG/5ML PO SUSR: 40.0000 mg | Freq: Every day | ORAL | Status: DC

## 2016-03-25 MED ORDER — LACTULOSE ENEMA
300.0000 mL | Freq: Once | ORAL | Status: AC
  Administered 2016-03-25: 300 mL via RECTAL
  Filled 2016-03-25: qty 300

## 2016-03-25 MED ORDER — CHLORHEXIDINE GLUCONATE 0.12% ORAL RINSE (MEDLINE KIT)
15.0000 mL | Freq: Two times a day (BID) | OROMUCOSAL | Status: DC
  Administered 2016-03-25 – 2016-03-29 (×8): 15 mL via OROMUCOSAL

## 2016-03-25 MED ORDER — LACTULOSE ENEMA
300.0000 mL | Freq: Four times a day (QID) | ORAL | Status: DC
  Administered 2016-03-25 – 2016-03-26 (×2): 300 mL via RECTAL
  Filled 2016-03-25 (×4): qty 300

## 2016-03-25 MED ORDER — FENTANYL CITRATE (PF) 100 MCG/2ML IJ SOLN
100.0000 ug | INTRAMUSCULAR | Status: AC | PRN
  Administered 2016-03-25 (×3): 100 ug via INTRAVENOUS
  Filled 2016-03-25 (×3): qty 2

## 2016-03-25 MED ORDER — LACTULOSE ENEMA
300.0000 mL | Freq: Once | ORAL | Status: DC
  Filled 2016-03-25: qty 300

## 2016-03-25 MED ORDER — MIDAZOLAM HCL 2 MG/2ML IJ SOLN
1.0000 mg | INTRAMUSCULAR | Status: AC | PRN
  Administered 2016-03-25 (×3): 1 mg via INTRAVENOUS
  Filled 2016-03-25 (×3): qty 2

## 2016-03-25 MED ORDER — SODIUM CHLORIDE 0.9 % IV BOLUS (SEPSIS)
1000.0000 mL | Freq: Once | INTRAVENOUS | Status: AC
  Administered 2016-03-25: 1000 mL via INTRAVENOUS

## 2016-03-25 MED ORDER — MAGNESIUM SULFATE IN D5W 1-5 GM/100ML-% IV SOLN
1.0000 g | Freq: Once | INTRAVENOUS | Status: AC
  Administered 2016-03-25: 1 g via INTRAVENOUS
  Filled 2016-03-25: qty 100

## 2016-03-25 MED ORDER — FENTANYL CITRATE (PF) 100 MCG/2ML IJ SOLN
100.0000 ug | INTRAMUSCULAR | Status: DC | PRN
  Administered 2016-03-25 – 2016-03-26 (×10): 100 ug via INTRAVENOUS
  Filled 2016-03-25 (×10): qty 2

## 2016-03-25 MED ORDER — VITAMIN K1 10 MG/ML IJ SOLN
10.0000 mg | INTRAMUSCULAR | Status: DC
  Administered 2016-03-25: 10 mg via INTRAVENOUS
  Filled 2016-03-25 (×2): qty 1

## 2016-03-25 MED ORDER — SUCCINYLCHOLINE CHLORIDE 20 MG/ML IJ SOLN
200.0000 mg | Freq: Once | INTRAMUSCULAR | Status: AC
  Administered 2016-03-25: 200 mg via INTRAVENOUS

## 2016-03-25 MED ORDER — PROPOFOL 1000 MG/100ML IV EMUL
5.0000 ug/kg/min | Freq: Once | INTRAVENOUS | Status: AC
  Administered 2016-03-25: 5 ug/kg/min via INTRAVENOUS
  Filled 2016-03-25: qty 100

## 2016-03-25 MED ORDER — ORAL CARE MOUTH RINSE
15.0000 mL | OROMUCOSAL | Status: DC
  Administered 2016-03-25 – 2016-03-27 (×20): 15 mL via OROMUCOSAL

## 2016-03-25 MED ORDER — SODIUM CHLORIDE 0.9 % IV SOLN
INTRAVENOUS | Status: DC
Start: 2016-03-25 — End: 2016-03-29
  Administered 2016-03-25 – 2016-03-26 (×2): via INTRAVENOUS

## 2016-03-25 MED ORDER — LORAZEPAM 2 MG/ML IJ SOLN: 0.0000 mg | Freq: Two times a day (BID) | INTRAMUSCULAR | Status: DC

## 2016-03-25 MED ORDER — THIAMINE HCL 100 MG/ML IJ SOLN: 100.0000 mg | Freq: Every day | INTRAMUSCULAR | Status: DC

## 2016-03-25 MED ORDER — VANCOMYCIN HCL 10 G IV SOLR
1500.0000 mg | INTRAVENOUS | Status: DC
  Administered 2016-03-26 – 2016-03-27 (×2): 1500 mg via INTRAVENOUS
  Filled 2016-03-25 (×3): qty 1500

## 2016-03-25 MED ORDER — VITAMIN B-1 100 MG PO TABS: 100.0000 mg | ORAL_TABLET | Freq: Every day | ORAL | Status: DC

## 2016-03-25 MED ORDER — SODIUM CHLORIDE 0.9 % IV BOLUS (SEPSIS)
500.0000 mL | Freq: Once | INTRAVENOUS | Status: AC
  Administered 2016-03-25: 500 mL via INTRAVENOUS

## 2016-03-25 MED ORDER — LACTULOSE 10 GM/15ML PO SOLN
30.0000 g | Freq: Once | ORAL | Status: AC
  Administered 2016-03-25: 30 g
  Filled 2016-03-25: qty 60

## 2016-03-25 MED ORDER — ENOXAPARIN SODIUM 80 MG/0.8ML ~~LOC~~ SOLN
70.0000 mg | SUBCUTANEOUS | Status: DC
  Administered 2016-03-25: 70 mg via SUBCUTANEOUS
  Filled 2016-03-25: qty 0.8

## 2016-03-25 MED ORDER — PIPERACILLIN-TAZOBACTAM 3.375 G IVPB
3.3750 g | Freq: Three times a day (TID) | INTRAVENOUS | Status: DC
  Administered 2016-03-25 – 2016-03-27 (×7): 3.375 g via INTRAVENOUS
  Filled 2016-03-25 (×7): qty 50

## 2016-03-25 NOTE — ED Triage Notes (Signed)
Pt arrived by EMS from home. Pt unresponsive w/ snoring respiration

## 2016-03-25 NOTE — Progress Notes (Signed)
Saw patient for oral bleeding.  About 30 cc dark red blood in suction canister from oral suction w [place].  Has not had any heavy bleeding or bloody stools.  Has hx of varices per chart and family says he has hist of "bleeding in his bowels". No evidence of active GIB at this time, hopefully just some oral trauma from intubation along with the coagulopathy of ESLD.  Plan H/H q 6 x 2 and give vit K for INR 2.  Kelly Splinter MD [facility] pgr [phone] 03/25/2016, 7:21 PM

## 2016-03-25 NOTE — ED Notes (Addendum)
CBG 119 PER Owen Pratte, RN

## 2016-03-25 NOTE — Progress Notes (Signed)
Abg in computer temp corrected to 35.5 c

## 2016-03-25 NOTE — ED Provider Notes (Addendum)
TIME SEEN: 3:30 AM  CHIEF COMPLAINT: Seizure, altered mental status  HPI: Pt is a 45 y.o. male with history of alcoholic end-stage liver disease, recent admission for liver failure and decompensation secondary to this, GI bleed likely from polypectomy versus rectal varices, anasarca, chronic kidney disease who presents emergency department today with EMS after a seizure. Family not present at bedside and patient is altered and cannot answer questions. No known history of seizures. It is unclear when he last drank alcohol. Sonorous respirations with EMS but otherwise hemodynamically stable. He is now opening his eyes and moving his extremities but is not following commands or answering questions.    Colonoscopy 03/08/16 Impression: - One 8 mm polyp in the descending colon, removed  with a hot snare. Resected and retrieved. - One 4 mm polyp in the cecum, removed with a cold  snare. Resected and retrieved. Rectal varices. Melanosis coli. Abnormal left colon  mucosa status post biopsy.  EGD Impression: - Grade II esophageal varices . - Portal hypertensive gastropathy. - Normal duodenal bulb and second portion of the  duodenum. - No specimens collected. I suspect patient bled  from his lower GI tract. See colonoscopy report.  Follow hemoglobin.  ROS: Level V caveat for altered mental status  PAST MEDICAL HISTORY/PAST SURGICAL HISTORY:  Past Medical History:  Diagnosis Date  . Alcohol abuse   . Alcoholic hepatitis   . 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 Authorizing Provider  Calcium  Carb-Cholecalciferol (CALCIUM 600+D) 600-800 MG-UNIT TABS Take 1 tablet by mouth 2 (two) times daily.     Historical Provider, MD  folic acid (FOLVITE) 1 MG tablet Take 1 tablet (1 mg total) by mouth daily. 12/31/15   Orvan Falconer, MD  lactulose (CHRONULAC) 10 GM/15ML solution Take 30 mLs (20 g total) by mouth 3 (three) times daily. 01/17/16   Rexene Alberts, MD  magnesium oxide (MAG-OX) 400 (241.3 Mg) MG tablet Take 1 tablet (400 mg total) by mouth 2 (two) times daily. 01/17/16   Rexene Alberts, MD  MILK THISTLE PO Take 1 tablet by mouth daily.    Historical Provider, MD  Misc Natural Products (DANDELION ROOT PO) Take 1 tablet by mouth daily.    Historical Provider, MD  nadolol (CORGARD) 40 MG tablet Take 1 tablet (40 mg total) by mouth 2 (two) times daily. 12/31/15   Orvan Falconer, MD  pantoprazole (PROTONIX) 40 MG tablet Take 1 tablet (40 mg total) by mouth 2 (two) times daily before a meal. 02/08/16   Danie Binder, MD  potassium chloride SA (K-DUR,KLOR-CON) 20 MEQ tablet Take 20 mEq by mouth 2 (two) times daily.    Historical Provider, MD  rifaximin (XIFAXAN) 550 MG TABS tablet Take 1 tablet (550 mg total) by mouth 2 (two) times daily. Patient not taking: Reported on 02/08/2016 12/31/15   Orvan Falconer, MD  spironolactone (ALDACTONE) 100 MG tablet Take 0.5 tablets (50 mg total) by mouth daily. Patient taking differently: Take 100 mg by mouth daily.  01/17/16   Rexene Alberts, MD  thiamine 100 MG tablet Take 1 tablet (100 mg total) by mouth daily. 12/31/15   Orvan Falconer, MD  torsemide (DEMADEX) 20 MG tablet Take 1 tablet (20 mg total) by mouth daily. For treatment of swelling. 03/23/16   Samuella Cota, MD    ALLERGIES:  No Known Allergies  SOCIAL HISTORY:  Social History  Substance Use Topics  .  Smoking status: Former Smoker    Packs/day: 0.25    Types: Cigarettes    Quit date: 2016  . Smokeless tobacco: Never Used  . Alcohol use No     Comment: former--quit after 12/2015 hospitalization    FAMILY  HISTORY: Family History  Problem Relation Age of Onset  . Colon cancer Neg Hx   . Liver disease Neg Hx     EXAM: Temp (!) 96.1 F (35.6 C) (Rectal)   Wt (!) 325 lb (147.4 kg)   BMI 41.73 kg/m  CONSTITUTIONAL: Patient will open eyes spontaneously and move his extremities but does not answer questions or follow commands, obese, chronically ill-appearing; GCS 9-10 HEAD: Normocephalic, atraumatic EYES: Conjunctivae clear, PERRL, scleral icterus bilaterally ENT: normal nose; no rhinorrhea; moist mucous membranes NECK: Supple, no meningismus, no LAD  CARD: Irregularly irregular and tachycardic; S1 and S2 appreciated; no murmurs, no clicks, no rubs, no gallops RESP: Normal chest excursion without splinting or tachypnea; breath sounds clear and equal bilaterally; no wheezes, no rhonchi, no rales, no hypoxia or respiratory distress, speaking full sentences ABD/GI: Normal bowel sounds; non-distended; soft, non-tender, no rebound, no guarding, no peritoneal signs RECTAL:  Soft brown stool in rectal vault, no gross blood or melena, guaiac positive BACK:  The back appears normal and is non-tender to palpation, there is no CVA tenderness EXT: Normal ROM in all joints; non-tender to palpation; nonpitting edema in bilateral lower extremities to the mid calf; normal capillary refill; no cyanosis, no calf tenderness or swelling    SKIN: Normal color for age and race; warm; no rash NEURO: Moves all extremities spontaneously and will open eyes intermittently but does not follow commands or answer questions, does withdraw to pain, GCS between 9 and 10   MEDICAL DECISION MAKING: Patient here with altered mental status in a seizure. He is likely post ictal at this time. Does have history of liver disease, chronic kidney disease. He is mildly hypothermic as well. Will obtain labs, urine, chest x-ray, CT of his head and cervical spine. No sign of trauma on exam. Blood glucose was EMS was 125. EKG shows atrial  flutter/fibrillation which appears to be chronic for patient.  ED PROGRESS: 4:10 AM  Pt's family now at bedside. They report that he was discharged on October 27 doing well. He is not had any complaints at home. Not been complaint of chest pain, shortness of breath, abdominal pain. They deny that he has ever had a seizure before. They report the seizure lasted 1-2 minutes. They state that he clenched his teeth and shook all 4 extremities. He was incontinent of urine with EMS. They state that he has not had any alcohol in 4-5 months. They have any known head injury. They state that he has been acting normally today. The report he is a full code.  ABG shows a pH of 7.512, PCO2 33.2, PO2 367, bicarbonate 27.8.  Given pt is hypothermic, tachycardic, he meets SIRS criteria.  No source of infection at this time but will start broad spectrum antibiotics.  Pt is under bair hugger at this time.  Will give IVF but do so gently given he was just admitted for volume overload, anasarca and had to be diuresed. I suspect that his lactate will be elevated because he did have a seizure today.   4:50 AM  Pt's alcohol level is 12. Family is adamant that he has not been drinking.  Ammonia 307. Will give 30 g lactulose in OG tube.  Creatinine  worsening at 2.55 compared to recent creatinine on discharge. He is getting IVF.  Lactate is 3.8 which is most likely related to recent seizure, less likely sepsis but he is getting IVF and broad spectrum abx.    Hemoglobin is 9.2, improved from previous. No leukocytosis. Platelets have improved and are 172,000. He is still coagulopathic.   Urine showed no sign of infection. Drug screen is negative.   Potassium 4.2. Magnesium slightly low at 1.7. Will give 1 g IV.  Patient monitored for an hour and still has a GCS of 9. Intubated for airway protection given his hepatic encephalopathy. Chest x-ray showed no significant edema or pneumothorax. No infiltrate.  Sedated on  propofol.  CT of his head and cervical spine pending. He will need admission to ICU.   6:20 AM  Pt's CT scan is read as negative. Discussed with Dr. Olevia Bowens with hospitalist service who agrees on admission. He agrees that I can place will orders for ICU, inpatient for this patient and that he can stay here at [facility]. At this time I do not feel he needs to be transferred to [facility]. He was intubated for airway protection for altered mental status in the setting of hepatic encephalopathy. Initial gas did not show hypoxia or hypercarbia. Patient will be seen by the daytime hospitalist team. He is stable at this time.   I reviewed all nursing notes, vitals, pertinent old records, EKGs, labs, imaging (as available).    EKG Interpretation  Date/Time:  Sunday March 25 2016 03:37:23 EDT Ventricular Rate:  110 PR Interval:    QRS Duration: 98 QT Interval:  414 QTC Calculation: 561 R Axis:   72 Text Interpretation:  Atrial flutter/fibrillation Borderline repolarization abnormality Prolonged QT interval No significant change since last tracing Confirmed by WARD,  DO, KRISTEN [phone]) on 03/25/2016 3:46:46 AM       EKG Interpretation  Date/Time:  Sunday March 25 2016 06:25:31 EDT Ventricular Rate:  84 PR Interval:    QRS Duration: 107 QT Interval:  431 QTC Calculation: 510 R Axis:   76 Text Interpretation:  Atrial flutter Prolonged QT interval Confirmed by WARD,  DO, KRISTEN [id number]) on 03/25/2016 6:29:07 AM        CRITICAL CARE Performed by: Nyra Jabs   Total critical care time: 50 minutes  Critical care time was exclusive of separately billable procedures and treating other patients.  Critical care was necessary to treat or prevent imminent or life-threatening deterioration.  Critical care was time spent personally by me on the following activities: development of treatment plan with patient and/or surrogate as well as nursing, discussions with  consultants, evaluation of patient's response to treatment, examination of patient, obtaining history from patient or surrogate, ordering and performing treatments and interventions, ordering and review of laboratory studies, ordering and review of radiographic studies, pulse oximetry and re-evaluation of patient's condition.    INTUBATION Performed by: Nyra Jabs  Required items: required blood products, implants, devices, and special equipment available Patient identity confirmed: provided demographic data and hospital-assigned identification number Time out: Immediately prior to procedure a "time out" was called to verify the correct patient, procedure, equipment, support staff and site/side marked as required.  Indications: Airway protection in a patient with altered mental status   Intubation method: Glidescope Laryngoscopy   Preoxygenation: BVM  Sedatives: 40 mg IV Etomidate Paralytic: 200 mg IV Succinylcholine  Tube Size: 7.5 cuffed  Post-procedure assessment: chest rise and ETCO2 monitor Breath sounds: equal  and absent over the epigastrium Tube secured with: ETT holder Chest x-ray interpreted by radiologist and me.  Chest x-ray findings: endotracheal tube in appropriate position  Patient tolerated the procedure well with no immediate complications.      [place], DO 03/25/16 [place], DO 03/25/16 [place], DO 03/25/16 [place], DO 03/25/16 [phone]

## 2016-03-25 NOTE — H&P (Addendum)
History and Physical  Antonio [name] [id number] DOB: 08/12/1970 DOA: 03/25/2016  PCP: Deloria Lair, MD   Patient coming from: Home  Chief Complaint: AMS  HPI:  45 year old man with end-stage liver disease, just hospitalized for 2 weeks with discharge 10/27 for decompensated end-stage liver disease, anasarca requiring prolonged diuresis and anemia secondary to GI bleed. EGD and colonoscopy without acute findings at that time. In the emergency department patient had nonfocal neurologic exam but could not protect airway and therefore was intubated for airway protection. Abnormalities included hypothermia, ammonia 307. Admitted for acute hepatic encephalopathy.  History per wife at bedside: He is doing well when he went home 10/27. He went to stay at his mother's house. He did not take lactulose that evening. 10/28 he did not take the evening lactulose. It is not clear whether he took lactulose during this stay. He did eat some breakfast 10/28. His wife went over them at home for lunch, he did not eat very much at that time. He reported constipation with no bowel movement 10/27 and only a small 10/28. He seemed to be tired and did sleep most of the day. He woke up for dinner which he had with his wife and seemed to be doing well at that time with the exception of being fatigued.  Approximate 10 PM October 28 he was noted to be confused, thought his wife was his mother. He laid down to sleep and then woke back up. While he was sitting in the chair she noticed seizure-like activity with some gentle shaking of his arms only. He was incontinent of urine. Seizure-like activity lasted only a short period of time.   ED Course: Temp 95.9, HR 110s, SBP 90+, no documented hypoxia,   Pertinent labs: ABG 7.535/30/360,  creatinine up to 2.55, lactic acid 3.8, Hgb up to 9.2, normal WBC and plts, negative u/a, negative UDS, INR 2.02 alcohol 12 EKG: Independently reviewed x3 atrial flutter, no dynamic changes,  prolonged QTc. Imaging: CXR independently reviewed. No focal consolidation, agree with radiology interpretation. ETT in place. CT head and neck no acute pathology  Review of Systems:  Limited by patient's condition but per wife Negative for fever, new visual changes, sore throat, new muscle aches, chest pain, SOB, dysuria, bleeding, n/v/abdominal pain.  Past Medical History:  Diagnosis Date  . Alcohol abuse   . Alcoholic hepatitis   . Cirrhosis with alcoholism [facility])     Past Surgical History:  Procedure Laterality Date  . BIOPSY  03/08/2016   Procedure: BIOPSY;  Surgeon: Daneil Dolin, MD;  [facility];  Service: Endoscopy;;  descending colon biopsies  . Colonoscopy     per patient around 2014 in [place]   . COLONOSCOPY WITH PROPOFOL [name] 03/08/2016   Procedure: COLONOSCOPY WITH PROPOFOL;  Surgeon: Daneil Dolin, MD;  Location: [facility];  Service: Endoscopy;  Laterality: [name];  . ESOPHAGOGASTRODUODENOSCOPY (EGD) WITH PROPOFOL [name] 12/28/2015   Dr. Oneida Alar: 4 columns of esophageal varices, 3 grade 1, 1 grade 2 with no evidence of bleeding. Mild portal gastropathy with no evidence of active bleeding.  . ESOPHAGOGASTRODUODENOSCOPY (EGD) WITH PROPOFOL [name] 03/08/2016   Procedure: ESOPHAGOGASTRODUODENOSCOPY (EGD) WITH PROPOFOL;  Surgeon: Daneil Dolin, MD;  Location[facility];  Service: Endoscopy;  Laterality: [name];  . POLYPECTOMY  03/08/2016   Procedure: POLYPECTOMY;  Surgeon: Daneil Dolin, MD;  Location: [facility];  Service: Endoscopy;;  cecum and descending      reports that he quit smoking  about 21 months ago. His smoking use included Cigarettes. He smoked 0.25 packs per day. He has never used smokeless tobacco. He reports that he does not drink alcohol or use drugs. Ambulatory   No Known Allergies  Family History  Problem Relation Age of Onset  . Colon cancer Neg Hx   . Liver disease Neg Hx      Prior to Admission medications   Medication Sig Start Date  End Date Taking? Authorizing Provider  Calcium Carb-Cholecalciferol (CALCIUM 600+D) 600-800 MG-UNIT TABS Take 1 tablet by mouth 2 (two) times daily.     Historical Provider, MD  folic acid (FOLVITE) 1 MG tablet Take 1 tablet (1 mg total) by mouth daily. 12/31/15   Orvan Falconer, MD  lactulose (CHRONULAC) 10 GM/15ML solution Take 30 mLs (20 g total) by mouth 3 (three) times daily. 01/17/16   Rexene Alberts, MD  magnesium oxide (MAG-OX) 400 (241.3 Mg) MG tablet Take 1 tablet (400 mg total) by mouth 2 (two) times daily. 01/17/16   Rexene Alberts, MD  MILK THISTLE PO Take 1 tablet by mouth daily.    Historical Provider, MD  Misc Natural Products (DANDELION ROOT PO) Take 1 tablet by mouth daily.    Historical Provider, MD  nadolol (CORGARD) 40 MG tablet Take 1 tablet (40 mg total) by mouth 2 (two) times daily. 12/31/15   Orvan Falconer, MD  pantoprazole (PROTONIX) 40 MG tablet Take 1 tablet (40 mg total) by mouth 2 (two) times daily before a meal. 02/08/16   Danie Binder, MD  potassium chloride SA (K-DUR,KLOR-CON) 20 MEQ tablet Take 20 mEq by mouth 2 (two) times daily.    Historical Provider, MD  rifaximin (XIFAXAN) 550 MG TABS tablet Take 1 tablet (550 mg total) by mouth 2 (two) times daily. Patient not taking: Reported on 02/08/2016 12/31/15   Orvan Falconer, MD  spironolactone (ALDACTONE) 100 MG tablet Take 0.5 tablets (50 mg total) by mouth daily. Patient taking differently: Take 100 mg by mouth daily.  01/17/16   Rexene Alberts, MD  thiamine 100 MG tablet Take 1 tablet (100 mg total) by mouth daily. 12/31/15   Orvan Falconer, MD  torsemide (DEMADEX) 20 MG tablet Take 1 tablet (20 mg total) by mouth daily. For treatment of swelling. 03/23/16   Samuella Cota, MD    Physical Exam: Vitals:   03/25/16 0815 03/25/16 0830 03/25/16 0845 03/25/16 0900  BP: (!) 102/57 (!) 97/55 (!) 99/52 (!) 97/54  Pulse: 86 88 87 87  Resp: 12 12 12 11   Temp: 97.7 F (36.5 C) 97.5 F (36.4 C) 97.3 F (36.3 C) 97.3 F (36.3 C)  TempSrc:        SpO2: 100% 100% 100% 100%  Weight: (!) 144.3 kg (318 lb 2 oz)     Height: 6\' 2"  (1.88 m)      Constitutional:  . Intubated and sedated, Appears ill Eyes:  . PERRL and irises appear normal . Normal conjunctivae and lids ENMT:  . external ears, nose appear normal . Lips appear normal Neck:  . neck appears normal, no masses . no thyromegaly Respiratory:  . CTA bilaterally, no w/r/r.  . Respiratory effort normal. No retractions or accessory muscle use Cardiovascular:  . No m/r/g, Tachycardic, regular rhythm . 3+ bilateral  LE extremity edema   Abdomen:  . Abdomen appears soft ntnd . No hernias noted Musculoskeletal:  . Digits/nails: no clubbing, cyanosis, petechiae, infection . Unable to assess tone and strength.  Unable to assess ROM  Skin:  . No rashes, lesions, ulcers . palpation of skin: no induration or nodules Neurologic:  . Unable to assess Psychiatric:   Unable to assess   Wt Readings from Last 3 Encounters:  03/25/16 (!) 144.3 kg (318 lb 2 oz)  03/23/16 (!) 147.2 kg (324 lb 8 oz)  02/08/16 (!) 153 kg (337 lb 6.4 oz)    I have personally reviewed following labs and imaging studies  Labs on Admission:  CBC:  Recent Labs Lab 03/19/16 0617 03/22/16 1520 03/25/16 0343  WBC 7.9 6.8 7.5  NEUTROABS  --   --  3.6  HGB 7.3* 7.2* 9.2*  HCT 22.4* 23.2* 28.4*  MCV 92.9 94.3 90.2  PLT 109* 120* [id number]   Basic Metabolic Panel:  Recent Labs Lab 03/19/16 0617 03/21/16 0612 03/22/16 0845 03/23/16 0831 03/25/16 0343  NA 136 134* 132* 132* 136  K 2.5* 2.7* 2.9* 3.1* 4.2  CL 101 95* 94* 95* 99*  CO2 29 32 31 32 26  GLUCOSE 98 86 77 86 109*  BUN 60* 56* 59* 62* 67*  CREATININE 1.54* 1.65* 1.73* 1.95* 2.55*  CALCIUM 8.7* 8.3* 8.6* 8.4* 8.9  MG  --   --  1.4*  --  1.7  PHOS 4.3 4.0  --   --   --    Liver Function Tests:  Recent Labs Lab 03/19/16 0617 03/20/16 0556 03/21/16 0612 03/22/16 1520 03/25/16 0343  AST  --  65*  --  74* 94*  ALT  --  20   --  22 30  ALKPHOS  --  108  --  126 157*  BILITOT  --  3.7*  --  3.6* 4.3*  PROT  --  6.7  --  6.7 8.0  ALBUMIN 2.3* 2.3* 2.4* 2.3* 2.7*    Recent Labs Lab 03/25/16 0343  AMMONIA 307*   Coagulation Profile:  Recent Labs Lab 03/20/16 0556 03/22/16 1520 03/25/16 0343  INR 2.27 2.19 2.02   Cardiac Enzymes:  Recent Labs Lab 03/25/16 0343 03/25/16 0920  TROPONINI 0.04* 0.04*   Anemia Panel:  Recent Labs  03/23/16 1034  FERRITIN 41  TIBC 244*  IRON 16*   Urine analysis:    Component Value Date/Time   COLORURINE YELLOW 03/25/2016 [place] 03/25/2016 0338   LABSPEC 1.010 03/25/2016 0338   PHURINE 7.5 03/25/2016 0338   GLUCOSEU NEGATIVE 03/25/2016 0338   HGBUR TRACE (A) 03/25/2016 0338   BILIRUBINUR NEGATIVE 03/25/2016 [place] 03/25/2016 0338   PROTEINUR NEGATIVE 03/25/2016 0338   NITRITE NEGATIVE 03/25/2016 0338   LEUKOCYTESUR NEGATIVE 03/25/2016 0338    Recent Results (from the past 240 hour(s))  Blood culture (routine x 2)     Status: None (Preliminary result)   Collection Time: 03/25/16  3:42 AM  Result Value Ref Range Status   Specimen Description BLOOD LEFT HAND  Final   Special Requests BOTTLES DRAWN AEROBIC AND ANAEROBIC 6CC EACH  Final   Culture NO GROWTH <12 HOURS  Final   Report Status PENDING  Incomplete  Blood culture (routine x 2)     Status: None (Preliminary result)   Collection Time: 03/25/16  3:45 AM  Result Value Ref Range Status   Specimen Description BLOOD LEFT FOREARM  Final   Special Requests BOTTLES DRAWN AEROBIC AND ANAEROBIC 4CC EACH  Final   Culture NO GROWTH <12 HOURS  Final   Report Status PENDING  Incomplete      Radiological Exams on Admission:  Ct Head Wo Contrast  Result Date: 03/25/2016 CLINICAL DATA:  45 year old male with altered mental status and unresponsiveness. EXAM: CT HEAD WITHOUT CONTRAST CT CERVICAL SPINE WITHOUT CONTRAST TECHNIQUE: Multidetector CT imaging of the head and  cervical spine was performed following the standard protocol without intravenous contrast. Multiplanar CT image reconstructions of the cervical spine were also generated. COMPARISON:  None. FINDINGS: CT HEAD FINDINGS Brain: No evidence of acute infarction, hemorrhage, hydrocephalus, extra-axial collection or mass lesion/mass effect. Vascular: No hyperdense vessel or unexpected calcification. Skull: Normal. Negative for fracture or focal lesion. Sinuses/Orbits: No acute finding. Other:  Partially visualized endotracheal and enteric tubes. CT CERVICAL SPINE FINDINGS Alignment: No acute subluxation. There is reversal of normal cervical lordosis. Skull base and vertebrae: No acute fracture. No primary bone lesion or focal pathologic process. Soft tissues and spinal canal: No prevertebral fluid or swelling. No visible canal hematoma. Disc levels: There is disc disease and endplate irregularity and bone spurring at C5-C6. Upper chest: Left posterior apical subpleural density, likely atelectasis/ scarring. This is incompletely evaluated. Other: An endotracheal tube and enteric tube are partially visualized. The distal tips of the tubes are not included in the images. IMPRESSION: No acute intracranial pathology. No acute/ traumatic cervical spine pathology. Electronically Signed   By: Anner Crete M.D.   On: 03/25/2016 06:17   Ct Cervical Spine Wo Contrast  Result Date: 03/25/2016 CLINICAL DATA:  45 year old male with altered mental status and unresponsiveness. EXAM: CT HEAD WITHOUT CONTRAST CT CERVICAL SPINE WITHOUT CONTRAST TECHNIQUE: Multidetector CT imaging of the head and cervical spine was performed following the standard protocol without intravenous contrast. Multiplanar CT image reconstructions of the cervical spine were also generated. COMPARISON:  None. FINDINGS: CT HEAD FINDINGS Brain: No evidence of acute infarction, hemorrhage, hydrocephalus, extra-axial collection or mass lesion/mass effect. Vascular:  No hyperdense vessel or unexpected calcification. Skull: Normal. Negative for fracture or focal lesion. Sinuses/Orbits: No acute finding. Other:  Partially visualized endotracheal and enteric tubes. CT CERVICAL SPINE FINDINGS Alignment: No acute subluxation. There is reversal of normal cervical lordosis. Skull base and vertebrae: No acute fracture. No primary bone lesion or focal pathologic process. Soft tissues and spinal canal: No prevertebral fluid or swelling. No visible canal hematoma. Disc levels: There is disc disease and endplate irregularity and bone spurring at C5-C6. Upper chest: Left posterior apical subpleural density, likely atelectasis/ scarring. This is incompletely evaluated. Other: An endotracheal tube and enteric tube are partially visualized. The distal tips of the tubes are not included in the images. IMPRESSION: No acute intracranial pathology. No acute/ traumatic cervical spine pathology. Electronically Signed   By: Anner Crete M.D.   On: 03/25/2016 06:17   Dg Chest Port 1 View  Result Date: 03/25/2016 CLINICAL DATA:  45 year old male with altered mental status and unresponsiveness. Status post intubation. EXAM: PORTABLE CHEST 1 VIEW COMPARISON:  Chest radiograph dated 03/11/2016 FINDINGS: Endotracheal tube with tip approximately 4.5 cm above the carina. An enteric tube extends into the left upper abdomen with tip and side-port likely in the proximal stomach. There is stable moderate cardiomegaly with mild vascular congestion. No focal consolidation, pleural effusion, or pneumothorax. Old-appearing fracture of the lateral aspect of the right clavicle at the [facility] joint. Clinical correlation is recommended. No definite acute osseous pathology. IMPRESSION: Endotracheal tube above the carina and enteric tube in the proximal stomach. Moderate cardiomegaly with mild vascular congestion. No focal consolidation. Electronically Signed   By: Anner Crete M.D.   On: 03/25/2016 05:39  Principal Problem:   Hepatic encephalopathy (HCC) Active Problems:   Decompensation of cirrhosis of liver (HCC)   Alcoholic cirrhosis of liver with ascites (HCC)   Anasarca   Encephalopathy, hepatic (HCC)   AKI (acute kidney injury) ([place])   Seizures (HCC)   Assessment/Plan  1. Seizure (new dx) in the context of acute hepatic encephalopathy and marked hyperammonemia (ammonia 307). Seizure probably secondary to hepatic encephalopathy. Potassium and Magnesium unremarkable, LFTs without sig change; hypothermia and elevated lactate noted. Lactic acid trending down with hydration. No evidence of sepsis. Chest x-ray unremarkable, urinalysis negative. Skin appears unremarkable. Intubated for airway protection. 2. Hepatic encephalopathy. Patient did not take full dose lactulose once home. 3. AKI superimposed on CKD stage III, likely secondary to poor oral intake. 4. End-stage liver disease secondary to alcoholic hepatitis with associated anasarca, thrombocytopenia, coagulopathy, portal gastropathy, esophageal varices, history of hepatic encephalopathy, anemia. MELD 29 = 19.6% 23-month mortality. 5. Trivial troponin elevation, stable. Secondary to acute illness. No further evaluation suggested at this point. 6. Minimal elevation of serum alcohol of unclear significance. Family denied alcohol intake. 7. Acute blood loss anemia status post GI bleed as an outpatient superimposed on anemia of chronic kidney disease. Hemoglobin improved compared with discharge .Underwent colonoscopy and EGD 10/12 with the removal of 1 polyp from the descending colon, one from the cecum. 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 EGD showed grade 2 esophageal varices, portal hypertensive gastropathy. 8. Morbid obesity, noted. 9. Atrial flutter on previous admission. No definite documentation of this.    Admit to ICU. Critically ill. Prognosis guarded. Suspect acute illness secondary to acute hepatic encephalopathy. Doubt infection.  No evidence of acute CNS process based on imaging. Neuro exam reportedly nonfocal on presentation to the emergency department.  Vent management and sedation per protocol, consult pulmonology.   Empiric abx and follow culture data  Aggressive lactulose  Trend ammonia, CMP, strict UOP  Consider PMT consultation 10/30 (last seen 12/2015)  DVT prophylaxis: SCDs Code Status: Full Family Communication: Family at bedside Consults called: ulmonology  It is my clinical opinion that admission to INPATIENT is reasonable and necessary in this patient . presenting with symptoms of shaking, confusion, concerning for seizure and acute hepatic encephalopathy  . in the context of PMH including: ESLD  . with pertinent positives on physical exam including: intubated, sedated . and pertinent positives on radiographic and laboratory data including: ammonia 307, creatinine 2.55 . Workup and treatment include mechanical ventilation, aggressive lactulose via NGT, empiric abx.   Given the aforementioned, the predictability of an adverse outcome is felt to be significant. I expect that the patient will require at least 2 midnights in the hospital to treat this condition.  Time spent: 50 minutes  [name], MD  [facility] Direct contact: [phone] --[place]ia [place]  --[email or web address]; password [email or web address]  7PM-7AM contact night coverage as above  03/25/2016, 10:45 AM  ADDENDUM 1740 Automatic BP cuff readings artifactually low. Per RN SBP 110 with manual cuff HR 110s UOP 1100 Troponins flat c/w demand ischemia Lactate trending down.  No BM per RN despite oral lactulose. Trial enemas. Discussed with Dr. Gala Romney re known rectal varices. No contraindication to enema or flexiseal insertion.  By signing my name below, I, Hilbert Odor, attest that this documentation has been prepared under the direction and in the presence of [place]. Sarajane Jews, MD. Electronically signed: Hilbert Odor,  Scribe.  03/25/16, 8:28 AM  I personally performed the services described in this documentation. All medical record entries made  by the scribe were at my direction. I have reviewed the chart and agree that the record reflects my personal performance and is accurate and complete. [name], MD

## 2016-03-25 NOTE — Progress Notes (Signed)
ANTIBIOTIC CONSULT NOTE-Preliminary  Pharmacy Consult for vancomycin, zosyn Indication: sepsis  No Known Allergies  Patient Measurements: Weight: (!) 325 lb (147.4 kg)   Vital Signs: Temp: 96.1 F (35.6 C) (10/29 0415) Temp Source: Rectal (10/29 0346) BP: 120/68 (10/29 0400) Pulse Rate: 110 (10/29 0415)  Labs:  Recent Labs  03/22/16 0845 03/22/16 1520 03/23/16 0831 03/25/16 0343  WBC  --  6.8  --   --   HGB  --  7.2*  --   --   PLT  --  120*  --   --   CREATININE 1.73*  --  1.95* 2.55*    Estimated Creatinine Clearance: 57.2 mL/min (by C-G formula based on SCr of 2.55 mg/dL (H)).  No results for input(s): VANCOTROUGH, VANCOPEAK, VANCORANDOM, GENTTROUGH, GENTPEAK, GENTRANDOM, TOBRATROUGH, TOBRAPEAK, TOBRARND, AMIKACINPEAK, AMIKACINTROU, AMIKACIN in the last 72 hours.   Microbiology: Recent Results (from the past 720 hour(s))  MRSA PCR Screening     Status: None   Collection Time: 03/06/16  6:00 PM  Result Value Ref Range Status   MRSA by PCR NEGATIVE NEGATIVE Final    Comment:        The GeneXpert MRSA Assay (FDA approved for NASAL specimens only), is one component of a comprehensive MRSA colonization surveillance program. It is not intended to diagnose MRSA infection nor to guide or monitor treatment for MRSA infections.     Medical History: Past Medical History:  Diagnosis Date  . Alcohol abuse   . Alcoholic hepatitis   . Cirrhosis with alcoholism ([place])     Medications:  Scheduled:  . etomidate  40 mg Intravenous Once  . lactulose  30 g Per Tube Once  . LORazepam  0-4 mg Intravenous Q6H   Followed by  . [START ON 03/27/2016] LORazepam  0-4 mg Intravenous Q12H  . succinylcholine  200 mg Intravenous Once  . thiamine  100 mg Oral Daily   Or  . thiamine  100 mg Intravenous Daily   Infusions:  . piperacillin-tazobactam (ZOSYN)  IV    . propofol (DIPRIVAN) infusion    . sodium chloride 1,000 mL (03/25/16 0428)  . vancomycin     PRN:   Anti-infectives    Start     Dose/Rate Route Frequency Ordered Stop   03/25/16 0445  piperacillin-tazobactam (ZOSYN) IVPB 3.375 g     3.375 g 12.5 mL/hr over 240 Minutes Intravenous Every 8 hours 03/25/16 0438     03/25/16 0445  vancomycin (VANCOCIN) IVPB 1000 mg/200 mL premix     1,000 mg 200 mL/hr over 60 Minutes Intravenous Every 1 hr x 2 03/25/16 0438 03/25/16 0644      Assessment: 45yo male with PMH end stage liver disease, recent GI bleed, CKD, morbid obesity. Admitted for seizure at home, AMS. R/o sepsis.   Goal of Therapy:  Vancomycin trough level 15-20 mcg/ml  Plan:  Preliminary review of pertinent patient information completed.  Protocol will be initiated with a one-time dose(s) of vancomycin 1000mg  IV Q1 hour x 2 doses for 2g load and zosyn 3.375 grams IV q8 hours.   Antonio [name] clinical pharmacist will complete review during morning rounds to assess patient and finalize treatment regimen.  Antonio [name], RPH 03/25/2016,4:42 AM

## 2016-03-25 NOTE — Progress Notes (Signed)
ANTIBIOTIC CONSULT NOTE  Pharmacy Consult for vancomycin, zosyn Indication: sepsis  No Known Allergies  Patient Measurements: Height: 6\' 2"  (188 cm) Weight: (!) 318 lb 2 oz (144.3 kg) IBW/kg (Calculated) : 82.2  Vital Signs: Temp: 97.7 F (36.5 C) (10/29 0815) Temp Source: Rectal (10/29 0346) BP: 102/57 (10/29 0815) Pulse Rate: 86 (10/29 0815)  Labs:  Recent Labs  03/22/16 0845 03/22/16 1520 03/23/16 0831 03/25/16 0343  WBC  --  6.8  --  7.5  HGB  --  7.2*  --  9.2*  PLT  --  120*  --  172  CREATININE 1.73*  --  1.95* 2.55*   Estimated Creatinine Clearance: 56.5 mL/min (by C-G formula based on SCr of 2.55 mg/dL (H)).  No results for input(s): VANCOTROUGH, VANCOPEAK, VANCORANDOM, GENTTROUGH, GENTPEAK, GENTRANDOM, TOBRATROUGH, TOBRAPEAK, TOBRARND, AMIKACINPEAK, AMIKACINTROU, AMIKACIN in the last 72 hours.   Microbiology: Recent Results (from the past 720 hour(s))  MRSA PCR Screening     Status: None   Collection Time: 03/06/16  6:00 PM  Result Value Ref Range Status   MRSA by PCR NEGATIVE NEGATIVE Final    Comment:        The GeneXpert MRSA Assay (FDA approved for NASAL specimens only), is one component of a comprehensive MRSA colonization surveillance program. It is not intended to diagnose MRSA infection nor to guide or monitor treatment for MRSA infections.   Blood culture (routine x 2)     Status: None (Preliminary result)   Collection Time: 03/25/16  3:42 AM  Result Value Ref Range Status   Specimen Description BLOOD LEFT HAND  Final   Special Requests BOTTLES DRAWN AEROBIC AND ANAEROBIC 6CC EACH  Final   Culture NO GROWTH <12 HOURS  Final   Report Status PENDING  Incomplete  Blood culture (routine x 2)     Status: None (Preliminary result)   Collection Time: 03/25/16  3:45 AM  Result Value Ref Range Status   Specimen Description BLOOD LEFT FOREARM  Final   Special Requests BOTTLES DRAWN AEROBIC AND ANAEROBIC 4CC EACH  Final   Culture NO GROWTH <12  HOURS  Final   Report Status PENDING  Incomplete   Medical History: Past Medical History:  Diagnosis Date  . Alcohol abuse   . Alcoholic hepatitis   . Cirrhosis with alcoholism ([place])    Medications:  Scheduled:  . LORazepam  0-4 mg Intravenous Q6H   Followed by  . [START ON 03/27/2016] LORazepam  0-4 mg Intravenous Q12H  . piperacillin-tazobactam (ZOSYN)  IV  3.375 g Intravenous Q8H  . thiamine  100 mg Oral Daily   Or  . thiamine  100 mg Intravenous Daily  . [START ON 03/26/2016] vancomycin  1,500 mg Intravenous Q24H   Infusions:  . sodium chloride 100 mL/hr at 03/25/16 0709   PRN:  Anti-infectives    Start     Dose/Rate Route Frequency Ordered Stop   03/26/16 0600  vancomycin (VANCOCIN) 1,500 mg in sodium chloride 0.9 % 500 mL IVPB     1,500 mg 250 mL/hr over 120 Minutes Intravenous Every 24 hours 03/25/16 0831     03/25/16 0445  piperacillin-tazobactam (ZOSYN) IVPB 3.375 g     3.375 g 12.5 mL/hr over 240 Minutes Intravenous Every 8 hours 03/25/16 0438     03/25/16 0445  vancomycin (VANCOCIN) IVPB 1000 mg/200 mL premix     1,000 mg 200 mL/hr over 60 Minutes Intravenous Every 1 hr x 2 03/25/16 0438 03/25/16 0737  Assessment: 45yo male with PMH end stage liver disease, recent GI bleed, CKD, morbid obesity. Admitted for seizure at home, AMS. R/o sepsis. Lactate elevated.  SCr elevated.  Will use NORMALIZED clcr for dosing.  Goal of Therapy:  Vancomycin trough level 15-20 mcg/ml  Plan:  Vancomycin 2000mg  IV x 1 then 1500mg  IV q24h Check trough at steady state Zosyn 3.375gm IV q8h, EID (for clcr > 20) Monitor labs, renal fxn, progress and c/s Deescalate ABX when improved / appropriate.    [place] Crane, Zakir Henner A, RPH 03/25/2016,8:36 AM

## 2016-03-25 NOTE — ED Notes (Signed)
CRITICAL VALUE ALERT  Critical value received:  LACTIC ACID 3.8  Date of notification: 03/25/2016  Time of notification: [phone]  Critical value read back: YES  Nurse who received alert:  Natividad Brood, RN  MD notified (1st page): DR. Leonides Schanz  Time of first page:  0445  MD notified (2nd page):  Time of second page:  Responding MD:  DR/ WARD  Time MD responded:  [phone]

## 2016-03-25 NOTE — Progress Notes (Signed)
Rate decreased to 12, fio2 decreased to 35

## 2016-03-25 NOTE — ED Notes (Signed)
CRITICAL VALUE ALERT  Critical value received:  Troponin 0.04 Date of notification:  03/25/16 Time of notification: [id number] Critical value read back:Yes.   Nurse who received alert: GMP MD notified (1st page): Dr Leonides Schanz Time of first page:  [phone] Responding MD: Dr Leonides Schanz Time MD responded:  [phone]

## 2016-03-26 ENCOUNTER — Inpatient Hospital Stay (HOSPITAL_COMMUNITY): Payer: BLUE CROSS/BLUE SHIELD

## 2016-03-26 LAB — CBC
HCT: 25.3 % — ABNORMAL LOW (ref 39.0–52.0)
Hemoglobin: 8.1 g/dL — ABNORMAL LOW (ref 13.0–17.0)
MCH: 29.3 pg (ref 26.0–34.0)
MCHC: 32 g/dL (ref 30.0–36.0)
MCV: 91.7 fL (ref 78.0–100.0)
PLATELETS: 152 10*3/uL (ref 150–400)
RBC: 2.76 MIL/uL — ABNORMAL LOW (ref 4.22–5.81)
RDW: 20.2 % — AB (ref 11.5–15.5)
WBC: 10.8 10*3/uL — AB (ref 4.0–10.5)

## 2016-03-26 LAB — COMPREHENSIVE METABOLIC PANEL
ALT: 27 U/L (ref 17–63)
AST: 82 U/L — AB (ref 15–41)
Albumin: 2.3 g/dL — ABNORMAL LOW (ref 3.5–5.0)
Alkaline Phosphatase: 115 U/L (ref 38–126)
Anion gap: 11 (ref 5–15)
BUN: 68 mg/dL — AB (ref 6–20)
CHLORIDE: 110 mmol/L (ref 101–111)
CO2: 24 mmol/L (ref 22–32)
CREATININE: 2.41 mg/dL — AB (ref 0.61–1.24)
Calcium: 9 mg/dL (ref 8.9–10.3)
GFR calc Af Amer: 36 mL/min — ABNORMAL LOW (ref >60)
GFR, EST NON AFRICAN AMERICAN: 31 mL/min — AB (ref >60)
Glucose, Bld: 92 mg/dL (ref 65–99)
Potassium: 2.6 mmol/L — CL (ref 3.5–5.1)
Sodium: 145 mmol/L (ref 135–145)
Total Bilirubin: 6.8 mg/dL — ABNORMAL HIGH (ref 0.3–1.2)
Total Protein: 7.2 g/dL (ref 6.5–8.1)

## 2016-03-26 LAB — BLOOD GAS, ARTERIAL
ACID-BASE EXCESS: 1.4 mmol/L (ref 0.0–2.0)
BICARBONATE: 26 mmol/L (ref 20.0–28.0)
Drawn by: 270161
FIO2: 0.35
LHR: 12 {breaths}/min
O2 SAT: 99.3 %
PATIENT TEMPERATURE: 37
PCO2 ART: 32.9 mmHg (ref 32.0–48.0)
PEEP: 5 cmH2O
PH ART: 7.486 — AB (ref 7.350–7.450)
VT: 650 mL
pO2, Arterial: 149 mmHg — ABNORMAL HIGH (ref 83.0–108.0)

## 2016-03-26 LAB — AMMONIA: Ammonia: 53 umol/L — ABNORMAL HIGH (ref 9–35)

## 2016-03-26 LAB — HEMOGLOBIN AND HEMATOCRIT, BLOOD
HCT: 26.4 % — ABNORMAL LOW (ref 39.0–52.0)
Hemoglobin: 8.5 g/dL — ABNORMAL LOW (ref 13.0–17.0)

## 2016-03-26 LAB — TSH: TSH: 4.352 u[IU]/mL (ref 0.350–4.500)

## 2016-03-26 MED ORDER — POTASSIUM CHLORIDE 10 MEQ/100ML IV SOLN
10.0000 meq | INTRAVENOUS | Status: AC
  Administered 2016-03-26 (×4): 10 meq via INTRAVENOUS
  Filled 2016-03-26 (×4): qty 100

## 2016-03-26 MED ORDER — SODIUM CHLORIDE 0.9 % IV SOLN
10.0000 ug/h | INTRAVENOUS | Status: DC
  Administered 2016-03-26: 30 ug/h via INTRAVENOUS
  Filled 2016-03-26: qty 50

## 2016-03-26 MED ORDER — LACTULOSE 10 GM/15ML PO SOLN
30.0000 g | ORAL | Status: DC
  Administered 2016-03-26 – 2016-03-27 (×6): 30 g via ORAL
  Filled 2016-03-26 (×6): qty 60

## 2016-03-26 MED ORDER — MIDAZOLAM HCL 2 MG/2ML IJ SOLN
2.0000 mg | INTRAMUSCULAR | Status: DC | PRN
  Administered 2016-03-26 (×4): 2 mg via INTRAVENOUS
  Filled 2016-03-26 (×4): qty 2

## 2016-03-26 MED ORDER — POTASSIUM CHLORIDE 20 MEQ PO PACK
40.0000 meq | PACK | ORAL | Status: AC
  Administered 2016-03-26 (×2): 40 meq
  Filled 2016-03-26 (×2): qty 2

## 2016-03-26 MED ORDER — MIDAZOLAM HCL 2 MG/2ML IJ SOLN
2.0000 mg | Freq: Once | INTRAMUSCULAR | Status: AC
  Administered 2016-03-26: 2 mg via INTRAVENOUS
  Filled 2016-03-26: qty 2

## 2016-03-26 MED ORDER — FUROSEMIDE 10 MG/ML IJ SOLN
40.0000 mg | Freq: Two times a day (BID) | INTRAMUSCULAR | Status: DC
  Administered 2016-03-26 – 2016-03-27 (×4): 40 mg via INTRAVENOUS
  Filled 2016-03-26 (×4): qty 4

## 2016-03-26 MED ORDER — FENTANYL 2500MCG IN NS 250ML (10MCG/ML) PREMIX INFUSION
INTRAVENOUS | Status: AC
  Filled 2016-03-26: qty 250

## 2016-03-26 NOTE — Consult Note (Signed)
Consult requested by: Dr. Sarajane Jews Consult requested for respiratory failure:  HPI: This is a 45 year old with history of end-stage liver disease with anasarca anemia. He came back to the hospital after being discharged on the 27th with an episode of acute hepatic failure and was found to be altered mental status again. He apparently did not take his lactulose on the evening prior to admission. He became acutely worse during the night of 1028 and then he had a seizure. He came to the emergency department where he was found to have hyperthermia tachycardia hypotension and was intubated because of inability to protect his airway. According to family at bedside he did not have any nausea vomiting no diarrhea no abdominal pain chest pain. Also according to family at bedside he does not have any known lung disease.  Past Medical History:  Diagnosis Date  . Alcohol abuse   . Alcoholic hepatitis   . 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 Colon cancer Neg Hx   . Liver disease Neg Hx      Social History   Social History  . Marital status: Married    Spouse name: [name]  . Number of children: [name]  . Years of education: [name]   Social History Main Topics  . Smoking status: Former Smoker    Packs/day: 0.25    Types: Cigarettes    Quit date: 2016  . Smokeless tobacco: Never Used  . Alcohol use No     Comment: former--quit after 12/2015 hospitalization  . Drug use: No  . Sexual activity: Yes   Other Topics Concern  . None   Social History Narrative  . None     ROS: Per family at bedside no chest pain no nausea vomiting or diarrhea no abdominal pain no shortness of breath. Otherwise negative but obviously incomplete because of his condition    Objective: Vital signs in last 24 hours: Temp:  [97 F (36.1 C)-99.1 F (37.3 C)] 98.6 F (37 C) (10/30 0600) Pulse Rate:  [86-120] 120 (10/30 0600) Resp:  [11-19] 13 (10/30 0600) BP:  (81-116)/(41-79) 108/67 (10/30 0600) SpO2:  [99 %-100 %] 100 % (10/30 0600) FiO2 (%):  [35 %] 35 % (10/30 0300) Weight:  [139.3 kg (307 lb 1.6 oz)-144.3 kg (318 lb 2 oz)] 139.3 kg (307 lb 1.6 oz) (10/30 0500) Weight change: -3.119 kg (-6 lb 14 oz) Last BM Date: 03/25/16  Intake/Output from previous day: 10/29 0701 - 10/30 0700 In: 7909.6 [P.O.:1600; I.V.:2859.6; IV Piggyback:1950] Out: 76 [Urine:3800]  PHYSICAL EXAM He is sedated intubated on the ventilator. Pupils react. Mucous membranes are moist. His neck is supple. Chest shows some rhonchi bilaterally. His heart is regular without murmur gallop or rub. His abdomen is soft obese without masses I don't feel his liver or his spleen but I think he probably still has some ascites. Extremities showed no clubbing or cyanosis he does have minimal edema and he has some generalized puffiness of his skin. Central nervous system exam shows it when he is not fully sedated he is moving all 4 extremities but is confused  Lab Results: Basic Metabolic Panel:  Recent Labs  03/23/16 0831 03/25/16 0343  NA 132* 136  K 3.1* 4.2  CL 95* 99*  CO2 32 26  GLUCOSE 86 109*  BUN 62* 67*  CREATININE 1.95* 2.55*  CALCIUM 8.4* 8.9  MG  --  1.7   Liver Function Tests:  Recent Labs  03/25/16 0343  AST 94*  ALT 30  ALKPHOS 157*  BILITOT 4.3*  PROT 8.0  ALBUMIN 2.7*   No results for input(s): LIPASE, AMYLASE in the last 72 hours.  Recent Labs  03/25/16 0343  AMMONIA 307*   CBC:  Recent Labs  03/25/16 0343 03/25/16 2025 03/26/16 0242  WBC 7.5  --   --   NEUTROABS 3.6  --   --   HGB 9.2* 8.3* 8.5*  HCT 28.4* 25.5* 26.4*  MCV 90.2  --   --   PLT 172  --   --    Cardiac Enzymes:  Recent Labs  03/25/16 0343 03/25/16 0920  TROPONINI 0.04* 0.04*   BNP: No results for input(s): PROBNP in the last 72 hours. D-Dimer: No results for input(s): DDIMER in the last 72 hours. CBG:  Recent Labs  03/25/16 1142  GLUCAP 90    Hemoglobin A1C: No results for input(s): HGBA1C in the last 72 hours. Fasting Lipid Panel: No results for input(s): CHOL, HDL, LDLCALC, TRIG, CHOLHDL, LDLDIRECT in the last 72 hours. Thyroid Function Tests: No results for input(s): TSH, T4TOTAL, FREET4, T3FREE, THYROIDAB in the last 72 hours. Anemia Panel:  Recent Labs  03/23/16 1034  FERRITIN 41  TIBC 244*  IRON 16*   Coagulation:  Recent Labs  03/25/16 0343  LABPROT 23.2*  INR 2.02   Urine Drug Screen: Drugs of Abuse     Component Value Date/Time   LABOPIA NONE DETECTED 03/25/2016 0338   COCAINSCRNUR NONE DETECTED 03/25/2016 0338   LABBENZ NONE DETECTED 03/25/2016 0338   AMPHETMU NONE DETECTED 03/25/2016 0338   THCU NONE DETECTED 03/25/2016 0338   LABBARB NONE DETECTED 03/25/2016 0338    Alcohol Level:  Recent Labs  03/25/16 0343  ETH 12*   Urinalysis:  Recent Labs  03/25/16 0338  COLORURINE YELLOW  LABSPEC 1.010  PHURINE 7.5  GLUCOSEU NEGATIVE  HGBUR TRACE*  BILIRUBINUR NEGATIVE  KETONESUR NEGATIVE  PROTEINUR NEGATIVE  NITRITE NEGATIVE  LEUKOCYTESUR NEGATIVE   Misc. Labs:   ABGS:  Recent Labs  03/25/16 0550  PHART 7.535*  PO2ART 157.0*  HCO3 29.8*     MICROBIOLOGY: Recent Results (from the past 240 hour(s))  Blood culture (routine x 2)     Status: None (Preliminary result)   Collection Time: 03/25/16  3:42 AM  Result Value Ref Range Status   Specimen Description BLOOD LEFT HAND  Final   Special Requests BOTTLES DRAWN AEROBIC AND ANAEROBIC 6CC EACH  Final   Culture NO GROWTH < 12 HOURS  Final   Report Status PENDING  Incomplete  Blood culture (routine x 2)     Status: None (Preliminary result)   Collection Time: 03/25/16  3:45 AM  Result Value Ref Range Status   Specimen Description BLOOD LEFT FOREARM  Final   Special Requests BOTTLES DRAWN AEROBIC AND ANAEROBIC 4CC EACH  Final   Culture NO GROWTH < 12 HOURS  Final   Report Status PENDING  Incomplete     Studies/Results: Ct Head Wo Contrast  Result Date: 03/25/2016 CLINICAL DATA:  45 year old male with altered mental status and unresponsiveness. EXAM: CT HEAD WITHOUT CONTRAST CT CERVICAL SPINE WITHOUT CONTRAST TECHNIQUE: Multidetector CT imaging of the head and cervical spine was performed following the standard protocol without intravenous contrast. Multiplanar CT image reconstructions of the cervical spine were also generated. COMPARISON:  None. FINDINGS: CT HEAD FINDINGS Brain: No evidence of acute infarction, hemorrhage, hydrocephalus, extra-axial collection or mass lesion/mass effect. Vascular:  No hyperdense vessel or unexpected calcification. Skull: Normal. Negative for fracture or focal lesion. Sinuses/Orbits: No acute finding. Other:  Partially visualized endotracheal and enteric tubes. CT CERVICAL SPINE FINDINGS Alignment: No acute subluxation. There is reversal of normal cervical lordosis. Skull base and vertebrae: No acute fracture. No primary bone lesion or focal pathologic process. Soft tissues and spinal canal: No prevertebral fluid or swelling. No visible canal hematoma. Disc levels: There is disc disease and endplate irregularity and bone spurring at C5-C6. Upper chest: Left posterior apical subpleural density, likely atelectasis/ scarring. This is incompletely evaluated. Other: An endotracheal tube and enteric tube are partially visualized. The distal tips of the tubes are not included in the images. IMPRESSION: No acute intracranial pathology. No acute/ traumatic cervical spine pathology. Electronically Signed   By: Anner Crete M.D.   On: 03/25/2016 06:17   Ct Cervical Spine Wo Contrast  Result Date: 03/25/2016 CLINICAL DATA:  45 year old male with altered mental status and unresponsiveness. EXAM: CT HEAD WITHOUT CONTRAST CT CERVICAL SPINE WITHOUT CONTRAST TECHNIQUE: Multidetector CT imaging of the head and cervical spine was performed following the standard protocol without  intravenous contrast. Multiplanar CT image reconstructions of the cervical spine were also generated. COMPARISON:  None. FINDINGS: CT HEAD FINDINGS Brain: No evidence of acute infarction, hemorrhage, hydrocephalus, extra-axial collection or mass lesion/mass effect. Vascular: No hyperdense vessel or unexpected calcification. Skull: Normal. Negative for fracture or focal lesion. Sinuses/Orbits: No acute finding. Other:  Partially visualized endotracheal and enteric tubes. CT CERVICAL SPINE FINDINGS Alignment: No acute subluxation. There is reversal of normal cervical lordosis. Skull base and vertebrae: No acute fracture. No primary bone lesion or focal pathologic process. Soft tissues and spinal canal: No prevertebral fluid or swelling. No visible canal hematoma. Disc levels: There is disc disease and endplate irregularity and bone spurring at C5-C6. Upper chest: Left posterior apical subpleural density, likely atelectasis/ scarring. This is incompletely evaluated. Other: An endotracheal tube and enteric tube are partially visualized. The distal tips of the tubes are not included in the images. IMPRESSION: No acute intracranial pathology. No acute/ traumatic cervical spine pathology. Electronically Signed   By: Anner Crete M.D.   On: 03/25/2016 06:17   Dg Chest Port 1 View  Result Date: 03/25/2016 CLINICAL DATA:  45 year old male with altered mental status and unresponsiveness. Status post intubation. EXAM: PORTABLE CHEST 1 VIEW COMPARISON:  Chest radiograph dated 03/11/2016 FINDINGS: Endotracheal tube with tip approximately 4.5 cm above the carina. An enteric tube extends into the left upper abdomen with tip and side-port likely in the proximal stomach. There is stable moderate cardiomegaly with mild vascular congestion. No focal consolidation, pleural effusion, or pneumothorax. Old-appearing fracture of the lateral aspect of the right clavicle at the [facility] joint. Clinical correlation is recommended. No  definite acute osseous pathology. IMPRESSION: Endotracheal tube above the carina and enteric tube in the proximal stomach. Moderate cardiomegaly with mild vascular congestion. No focal consolidation. Electronically Signed   By: Anner Crete M.D.   On: 03/25/2016 05:39    Medications:  Prior to Admission:  Prescriptions Prior to Admission  Medication Sig Dispense Refill Last Dose  . Calcium Carb-Cholecalciferol (CALCIUM 600+D) 600-800 MG-UNIT TABS Take 1 tablet by mouth 2 (two) times daily.    03/05/2016 at Unknown time  . folic acid (FOLVITE) 1 MG tablet Take 1 tablet (1 mg total) by mouth daily. 30 tablet 1 03/05/2016 at Unknown time  . lactulose (CHRONULAC) 10 GM/15ML solution Take 30 mLs (20 g total) by mouth  3 (three) times daily. 240 mL 3 03/05/2016 at Unknown time  . magnesium oxide (MAG-OX) 400 (241.3 Mg) MG tablet Take 1 tablet (400 mg total) by mouth 2 (two) times daily.   03/05/2016 at Unknown time  . MILK THISTLE PO Take 1 tablet by mouth daily.   03/05/2016 at Unknown time  . Misc Natural Products (DANDELION ROOT PO) Take 1 tablet by mouth daily.   03/05/2016 at Unknown time  . nadolol (CORGARD) 40 MG tablet Take 1 tablet (40 mg total) by mouth 2 (two) times daily. 60 tablet 2 03/05/2016 at 1700  . pantoprazole (PROTONIX) 40 MG tablet Take 1 tablet (40 mg total) by mouth 2 (two) times daily before a meal. 30 tablet 11 03/05/2016 at Unknown time  . potassium chloride SA (K-DUR,KLOR-CON) 20 MEQ tablet Take 20 mEq by mouth 2 (two) times daily.   03/05/2016 at Unknown time  . rifaximin (XIFAXAN) 550 MG TABS tablet Take 1 tablet (550 mg total) by mouth 2 (two) times daily. (Patient not taking: Reported on 02/08/2016) 42 tablet 1 Not Taking  . spironolactone (ALDACTONE) 100 MG tablet Take 0.5 tablets (50 mg total) by mouth daily. (Patient taking differently: Take 100 mg by mouth daily. )   03/05/2016 at Unknown time  . thiamine 100 MG tablet Take 1 tablet (100 mg total) by mouth daily. 30 tablet 1  03/05/2016 at Unknown time  . torsemide (DEMADEX) 20 MG tablet Take 1 tablet (20 mg total) by mouth daily. For treatment of swelling. 30 tablet 3    Scheduled: . chlorhexidine gluconate (MEDLINE KIT)  15 mL Mouth Rinse BID  . enoxaparin (LOVENOX) injection  70 mg Subcutaneous Q24H  . famotidine (PEPCID) IV  20 mg Intravenous Q24H  . lactulose  300 mL Rectal Q6H  . mouth rinse  15 mL Mouth Rinse 10 times per day  . metoprolol  5 mg Intravenous Q6H  . phytonadione (VITAMIN K) IV  10 mg Intravenous Q24 Hr x 2  . piperacillin-tazobactam (ZOSYN)  IV  3.375 g Intravenous Q8H  . rifaximin  550 mg Per Tube BID  . sodium chloride flush  3 mL Intravenous Q12H  . vancomycin  1,500 mg Intravenous Q24H   Continuous: . sodium chloride 125 mL/hr at 03/25/16 1746   EQA:STMHDQQI (SUBLIMAZE) injection, midazolam  Assesment:He was admitted with hepatic encephalopathy. He has respiratory failure because of inability to protect his airway with high risk of aspiration. He is still confused when his sedation is less. No further seizures. Principal Problem:   Hepatic encephalopathy (HCC) Active Problems:   Decompensation of cirrhosis of liver (HCC)   Alcoholic cirrhosis of liver with ascites (HCC)   Anasarca   Encephalopathy, hepatic (HCC)   AKI (acute kidney injury) ([place])   Seizures ([place])    Plan: Continue ventilator support. I see little opportunity to get him off today. He apparently becomes very agitated so I'm going to adjust his sedation    LOS: 1 day   Amer Alcindor L 03/26/2016, 7:48 AM

## 2016-03-26 NOTE — Progress Notes (Deleted)
Palliative Medicine consult noted. Due to high referral volume, there may be a delay seeing this patient. Please call the Palliative Medicine Team office at [phone] if recommendations are needed in the interim.  Thank you for inviting [place] to see this patient.  Marjie Skiff Emalyn Schou, RN, BSN, [facility] 03/26/2016 12:57 PM Cell [phone] 8:00-4:00 Monday-Friday Office [phone]

## 2016-03-26 NOTE — Progress Notes (Signed)
PROGRESS NOTE  Antonio [name] BTD:[id number] DOB: 1970-12-25 DOA: 03/25/2016 PCP: Deloria Lair, MD  Brief Narrative: 45 year old man with end-stage liver disease, just hospitalized for 2 weeks with discharge 10/27 for decompensated end-stage liver disease, anasarca requiring prolonged diuresis and anemia secondary to GI bleed. EGD and colonoscopy without acute findings at that time. In the emergency department patient had nonfocal neurologic exam but could not protect airway and therefore was intubated for airway protection. Abnormalities included hypothermia, ammonia 307. Admitted for acute hepatic encephalopathy.  Assessment/Plan: 1. Acute hepatic encephalopathy with marked hyperammonemia, with associated seizure as an outpatient. Serum ammonia markedly improved. 2. Acute kidney injury superimposed on CKD stage III, renal function improved. Excellent urine output. 3. 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 MELD 29 = 19.6% 60-monthmortality. Plan palliative medicine consultation. 4. Acute blood loss anemia status post GI bleed approximately 2 weeks ago. Underwent colonoscopy and EGD October 12 with removal of one polyp from descending colon and one from cecum. Rectal varices noted. EGD showed grade 2 esophageal varices, portal hypertensive gastropathy. No evidence of recurrent bleeding. 5. Atrial fibrillation/flutter. Diagnosed last admission. Echocardiogram on file August 2017. No significant valvular abnormalities. Normal LVEF EF at that time. TSH pending. Not a candidate for anticoagulation at this point. Continue metoprolol as blood pressure control.. 6. Morbid obesity 7. Trivial troponin elevation. Demand ischemia secondary to acute illness. No further evaluation suggested.   Remains critically ill but hemodynamics are stable and renal function stable with excellent  urine output. Troponins were trivially elevated and are of no clinical significance. Lactic acid trended down appropriately. There is no evidence to suggest infection or sepsis at this point, likely discontinue antibiotics 10/31 if culture data remains negative. Ammonia hasn't responded appropriately to aggressive lactulose.  Continue lactulose. Check CMP and ammonia level in the morning.  Hopefully can extubate 10/31. Continue ventilator management as per pulmonology, assistance appreciated.  Recheck CBC in the morning.  Continue beta blocker, transition to oral as improves. He's not a candidate for anticoagulation given recent GI bleed. Can restart prophylactic Lovenox 10/31 if no further bleeding from the mouth.  Consult palliative medicine   Replace potassium  DVT prophylaxis: SCDs Code Status: full code Family Communication: none Disposition Plan: pending, may need SNF  DMurray Hodgkins MD  [facility] Direct contact: [phone]--[place]ia [name] --[email or web address]; password [email or web address]  7PM-7AM contact night coverage as above 03/26/2016, 8:39 AM  LOS: 1 day   Consultants:  Pulmonology  Procedures:    Antimicrobials:  Zosyn 10/29 >>   Vancomycin 10/29 >>   Interval history/Subjective: Some bleeding from mouth last night. Evaluated by cross cover physician. Hemoglobin stable overnight.  Remains intubated and sedated. RN reports enemas have been effective. No significant bleeding noted.  Objective: Vitals:   03/26/16 0400 03/26/16 0500 03/26/16 0600 03/26/16 0800  BP: (!) 100/59 112/64 108/67   Pulse: 89 (!) 108 (!) 120   Resp: '12 19 13   '$ Temp: 99 F (37.2 C) 99.1 F (37.3 C) 98.6 F (37 C)   TempSrc:      SpO2: 100% 100% 100% 100%  Weight:  (!) 139.3 kg (307 lb 1.6 oz)    Height:        Intake/Output Summary (Last 24 hours) at 03/26/16 0839 Last data filed at 03/26/16 0605  Gross per 24 hour  Intake          6709.59 ml  Output  2700 ml   Net          4009.59 ml     Filed Weights   03/25/16 0336 03/25/16 0815 03/26/16 0500  Weight: (!) 147.4 kg (325 lb) (!) 144.3 kg (318 lb 2 oz) (!) 139.3 kg (307 lb 1.6 oz)    Exam:    Constitutional:  . Appears Ill but not toxic. Eyes:  . PERRL and irises appear normal . Conjunctivae and lids appear normal Respiratory:  . CTA bilaterally, no w/r/r.  . Respiratory effort normal on ventilator.  Cardiovascular:  . Irregular, no m/r/g . 2-3 plus bilateral LE extremity edema   . Telemetry atrial fibrillation Abdomen:  . Soft Musculoskeletal:  o Tone appears grossly normal Skin:  . No rashes, lesions, ulcers noted . palpation of skin: no induration or nodules Psychiatric:  . judgement and insight not assessable . Mental status o Mood, affect appropriate not assessable.  I have personally reviewed following labs and imaging studies:  Troponin flat  Lactic acid trended down appropriately  Hemoglobin stable, 8.5  BUN, creatinine stable/improved. Potassium 2.6.  Ammonia 53  urine output 3800  Scheduled Meds: . chlorhexidine gluconate (MEDLINE KIT)  15 mL Mouth Rinse BID  . enoxaparin (LOVENOX) injection  70 mg Subcutaneous Q24H  . famotidine (PEPCID) IV  20 mg Intravenous Q24H  . lactulose  300 mL Rectal Q6H  . mouth rinse  15 mL Mouth Rinse 10 times per day  . metoprolol  5 mg Intravenous Q6H  . phytonadione (VITAMIN K) IV  10 mg Intravenous Q24 Hr x 2  . piperacillin-tazobactam (ZOSYN)  IV  3.375 g Intravenous Q8H  . rifaximin  550 mg Per Tube BID  . sodium chloride flush  3 mL Intravenous Q12H  . vancomycin  1,500 mg Intravenous Q24H   Continuous Infusions: . sodium chloride 125 mL/hr at 03/25/16 1746    Principal Problem:   Hepatic encephalopathy (HCC) Active Problems:   Decompensation of cirrhosis of liver (HCC)   Alcoholic cirrhosis of liver with ascites (HCC)   Anasarca   Encephalopathy, hepatic (HCC)   AKI (acute kidney injury) ([place])    Seizures ([place])   LOS: 1 day

## 2016-03-26 NOTE — Progress Notes (Signed)
Palliative Medicine RN Note: Order noted for PMT consult. PMT provider will return to [facility] on Tuesday, 03/27/2016, and the patient will be seen then.  Marjie Skiff Jayon Matton, RN, BSN, [place] 03/26/2016 2:09 PM Cell [phone] 8:00-4:00 Monday-Friday Office [phone]

## 2016-03-27 ENCOUNTER — Encounter (HOSPITAL_COMMUNITY): Payer: Self-pay | Admitting: Primary Care

## 2016-03-27 ENCOUNTER — Inpatient Hospital Stay (HOSPITAL_COMMUNITY): Payer: BLUE CROSS/BLUE SHIELD

## 2016-03-27 DIAGNOSIS — Z7189 Other specified counseling: Secondary | ICD-10-CM

## 2016-03-27 DIAGNOSIS — Z515 Encounter for palliative care: Secondary | ICD-10-CM

## 2016-03-27 LAB — CBC
HEMATOCRIT: 25.4 % — AB (ref 39.0–52.0)
Hemoglobin: 7.9 g/dL — ABNORMAL LOW (ref 13.0–17.0)
MCH: 29.2 pg (ref 26.0–34.0)
MCHC: 31.1 g/dL (ref 30.0–36.0)
MCV: 93.7 fL (ref 78.0–100.0)
Platelets: 137 10*3/uL — ABNORMAL LOW (ref 150–400)
RBC: 2.71 MIL/uL — ABNORMAL LOW (ref 4.22–5.81)
RDW: 20 % — AB (ref 11.5–15.5)
WBC: 10.3 10*3/uL (ref 4.0–10.5)

## 2016-03-27 LAB — BASIC METABOLIC PANEL
Anion gap: 9 (ref 5–15)
BUN: 61 mg/dL — AB (ref 6–20)
CHLORIDE: 114 mmol/L — AB (ref 101–111)
CO2: 24 mmol/L (ref 22–32)
Calcium: 8.9 mg/dL (ref 8.9–10.3)
Creatinine, Ser: 2.05 mg/dL — ABNORMAL HIGH (ref 0.61–1.24)
GFR calc Af Amer: 44 mL/min — ABNORMAL LOW (ref >60)
GFR calc non Af Amer: 38 mL/min — ABNORMAL LOW (ref >60)
GLUCOSE: 94 mg/dL (ref 65–99)
POTASSIUM: 3 mmol/L — AB (ref 3.5–5.1)
Sodium: 147 mmol/L — ABNORMAL HIGH (ref 135–145)

## 2016-03-27 LAB — BLOOD GAS, ARTERIAL
Acid-Base Excess: 0.5 mmol/L (ref 0.0–2.0)
BICARBONATE: 25.5 mmol/L (ref 20.0–28.0)
BICARBONATE: 24.8 mmol/L (ref 20.0–28.0)
DRAWN BY: 270161
Drawn by: 22223
FIO2: 35
FIO2: 0.35
LHR: 12 {breaths}/min
Mode: POSITIVE
O2 SAT: 99.4 %
O2 Saturation: 98.2 %
PATIENT TEMPERATURE: 37
PEEP/CPAP: 5 cmH2O
PEEP: 5 cmH2O
PH ART: 7.445 (ref 7.350–7.450)
PH ART: 7.39 (ref 7.350–7.450)
PO2 ART: 118 mmHg — AB (ref 83.0–108.0)
PRESSURE SUPPORT: 5 cmH2O
Patient temperature: 98.9
VT: 650 mL
pCO2 arterial: 36.6 mmHg (ref 32.0–48.0)
pCO2 arterial: 42.1 mmHg (ref 32.0–48.0)
pO2, Arterial: 152 mmHg — ABNORMAL HIGH (ref 83.0–108.0)

## 2016-03-27 LAB — MAGNESIUM: Magnesium: 1.6 mg/dL — ABNORMAL LOW (ref 1.7–2.4)

## 2016-03-27 LAB — AMMONIA: Ammonia: 28 umol/L (ref 9–35)

## 2016-03-27 MED ORDER — METOPROLOL TARTRATE 5 MG/5ML IV SOLN
10.0000 mg | Freq: Four times a day (QID) | INTRAVENOUS | Status: DC
  Administered 2016-03-28: 10 mg via INTRAVENOUS

## 2016-03-27 MED ORDER — POTASSIUM CHLORIDE 20 MEQ PO PACK: 40.0000 meq | PACK | Freq: Two times a day (BID) | ORAL | Status: DC

## 2016-03-27 MED ORDER — LACTULOSE 10 GM/15ML PO SOLN
30.0000 g | Freq: Three times a day (TID) | ORAL | Status: DC
  Administered 2016-03-27 (×2): 30 g
  Filled 2016-03-27 (×3): qty 60

## 2016-03-27 MED ORDER — LACTULOSE 10 GM/15ML PO SOLN
30.0000 g | Freq: Three times a day (TID) | ORAL | Status: DC
  Administered 2016-03-27: 30 g via ORAL

## 2016-03-27 MED ORDER — MAGNESIUM SULFATE 2 GM/50ML IV SOLN
2.0000 g | Freq: Once | INTRAVENOUS | Status: AC
  Administered 2016-03-27: 2 g via INTRAVENOUS
  Filled 2016-03-27: qty 50

## 2016-03-27 MED ORDER — POTASSIUM CHLORIDE 20 MEQ PO PACK
40.0000 meq | PACK | Freq: Two times a day (BID) | ORAL | Status: AC
  Administered 2016-03-27: 40 meq
  Filled 2016-03-27: qty 2

## 2016-03-27 MED ORDER — METOPROLOL TARTRATE 5 MG/5ML IV SOLN
5.0000 mg | Freq: Once | INTRAVENOUS | Status: AC
  Administered 2016-03-27: 5 mg via INTRAVENOUS
  Filled 2016-03-27: qty 5

## 2016-03-27 MED ORDER — POTASSIUM CHLORIDE 10 MEQ/100ML IV SOLN
10.0000 meq | INTRAVENOUS | Status: AC
  Administered 2016-03-27 (×6): 10 meq via INTRAVENOUS
  Filled 2016-03-27 (×6): qty 100

## 2016-03-27 NOTE — Procedures (Signed)
Extubation Procedure Note  Patient Details:   Name: Antonio [name] DOB: 08/01/1970 MRN: [id number]   Airway Documentation:     Evaluation  O2 sats: [id number] Complications: none noted Patient did tolerate procedure well. Bilateral Breath Sounds: Clear, Diminished   Pt able to verbalize name and birth date.   Lucianne Muss 03/27/2016, 11:44 AM

## 2016-03-27 NOTE — Progress Notes (Addendum)
PROGRESS NOTE  Antonio [name] UEK:[id number] DOB: [birth year] DOA: 03/25/2016 PCP: Deloria Lair, MD  Brief Narrative: 45 year old man with end-stage liver disease, just hospitalized for 2 weeks with discharge 10/27 for decompensated end-stage liver disease, anasarca requiring prolonged diuresis and anemia secondary to GI bleed. EGD and colonoscopy without acute findings at that time.  Presented 10/29 with altered mental status and reported seizure at home. In the emergency department patient had nonfocal neurologic exam but could not protect airway and therefore was intubated for airway protection. Abnormalities included hypothermia, ammonia 307. Admitted for acute hepatic encephalopathy, marked hyperammonemia, acute kidney injury superimposed on chronic kidney disease, atrial fibrillation/flutter with rapid ventricular response, decompensated end-stage liver disease. As of 10/31 he seems to be improving with resolution of hyperammonemia. Hopefully can be extubated today.  Assessment/Plan: 1. Acute hepatic encephalopathy with marked hyperammonemia, with associated seizure as an outpatient. Ammonia has returned normal. Mental status seems to be improving. 2. Reported seizure prior to admission. By wife's description this may have been seizure-like activity rather than true seizure. He's had no seizure activity during this hospitalization. Suspect this is secondary to marked encephalopathy rather than new seizure onset. No antiepileptics unless has further seizures. 3. Acute kidney injury superimposed on CKD stage III, actually improved with diuresis. Continue to follow high/output. Continue IV Lasix. 4. 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 MELD 29 = 19.6% 56-monthmortality. Palliative medicine consultation pending. 5. Acute blood loss anemia status post GI bleed approximately 2  weeks ago. Underwent colonoscopy and EGD October 12 with removal of one polyp from descending colon and one from cecum. Rectal varices noted. EGD showed grade 2 esophageal varices, portal hypertensive gastropathy. Has some bleeding from the mouth on the day of admission. No GI bleeding or bleeding from airway. Seems to have resolved at this point. 6. Atrial fibrillation/flutter. Diagnosed last admission. Echocardiogram on file August 2017. No significant valvular abnormalities. Normal LVEF EF at that time. TSH WNL. Not a candidate for anticoagulation at this point given recent GI bleed. Continue metoprolol for rate control. 7. Morbid obesity. 8. Demand ischemia secondary to acute illness. No further evaluation suggested.   Seems to be improving with resolution of hyperammonemia. Ammonia has returned to normal. Hemodynamics stable heart rate is controlled. Blood cultures no growth today. No evidence of infection. Discontinue antibiotics today.  Decrease lactulose frequency, check BMP, ammonia in a.m.  Replace magnesium & potassium  Monitor I/O. Continue IV Lasix today. When able to take by mouth, change to oral torsemide 60 mg daily which was extremely effective on previous admission for diuresis.  Change to oral beta blocker when able  Palliative medicine consultation  DVT prophylaxis: SCDs. Prophylactic anticoagulation on hold given bleeding from mouth October 29. If no further issues could restart prophylactic anticoagulation November 1. Code Status: full code Family Communication: wife at bedside Disposition Plan: pending, may need SNF  DMurray Hodgkins MD  [facility] Direct contact: [phone]--Via aParker [email or web address]; password [email or web address]  7PM-7AM contact night coverage as above 03/27/2016, 8:52 AM  LOS: 2 days   Consultants:  Pulmonology  Procedures:  ETT 10/29 >>  Antimicrobials:  Zosyn 10/29 >>  10/31  Vancomycin 10/29 >> 10/31   Interval  history/Subjective: No issues charted overnight.  Improving. Wife at bedside, reports he is responsive to her. Remains intubated.  Objective: Vitals:   03/26/16 2200 03/26/16 2300 03/27/16 0000 03/27/16 0500  BP: 102/64 (!) 92/58 (Marland Kitchen  99/54   Pulse: (!) 119 91 89   Resp: (!) '9 19 19   '$ Temp: 97.9 F (36.6 C) 97.9 F (36.6 C) 97.9 F (36.6 C)   TempSrc:      SpO2: 100% 100% 100%   Weight:    135.5 kg (298 lb 11.6 oz)  Height:        Intake/Output Summary (Last 24 hours) at 03/27/16 0852 Last data filed at 03/27/16 0500  Gross per 24 hour  Intake           2155.5 ml  Output             5350 ml  Net          -3194.5 ml     Filed Weights   03/25/16 0815 03/26/16 0500 03/27/16 0500  Weight: (!) 144.3 kg (318 lb 2 oz) (!) 139.3 kg (307 lb 1.6 oz) 135.5 kg (298 lb 11.6 oz)    Exam: Constitutional:   Intubated, sedated but arouses to voice. Eyes:   Pupils equal, round, reactive to light. Normal lids, conjunctiva. ENMT:   Intubated, lips appear unremarkable Respiratory:   Clear to auscultation bilaterally. No wheezes, rales or rhonchi. Normal respiratory effort. Cardiovascular:   Regular rate and rhythm. No murmur, rub or gallop.  2-3 plus edema bilateral lower extremities Skin:   Appears grossly unremarkable Neurologic:   Unassessable on sedation Psychiatric:   Unassessable on sedation   I have personally reviewed following labs and imaging studies:  UOP 2924  +1.1 L since admit  ABG 7.445/36/152  Potassium 3.0, creatinine improved, BUN improved 61/2.05  Magnesium 1.6  Hemoglobin stable 7.9. No leukocytosis.  Serum ammonia 28  Scheduled Meds: . chlorhexidine gluconate (MEDLINE KIT)  15 mL Mouth Rinse BID  . famotidine (PEPCID) IV  20 mg Intravenous Q24H  . furosemide  40 mg Intravenous Q12H  . lactulose  30 g Oral Q4H  . mouth rinse  15 mL Mouth Rinse 10 times per day  . metoprolol  5 mg Intravenous Q6H  . piperacillin-tazobactam (ZOSYN)   IV  3.375 g Intravenous Q8H  . rifaximin  550 mg Per Tube BID  . sodium chloride flush  3 mL Intravenous Q12H  . vancomycin  1,500 mg Intravenous Q24H   Continuous Infusions: . sodium chloride 75 mL/hr at 03/26/16 2100  . fentaNYL infusion INTRAVENOUS 30 mcg/hr (03/26/16 1930)    Principal Problem:   Hepatic encephalopathy (HCC) Active Problems:   Decompensation of cirrhosis of liver (HCC)   Alcoholic cirrhosis of liver with ascites (HCC)   Anasarca   Encephalopathy, hepatic (HCC)   AKI (acute kidney injury) ([place])   Seizures ([place])   LOS: 2 days

## 2016-03-27 NOTE — Consult Note (Signed)
Consultation Note Date: 03/27/2016   Patient Name: Antonio [name]  DOB: [birth year]  MRN: [id number]  Age / Sex: 45 y.o., male  PCP: [name], MD Referring Physician: Samuella Cota, MD  Reason for Consultation: Establishing goals of care and Psychosocial/spiritual support  HPI/Patient Profile: 45 y.o. male  with past medical history of Alcohol abuse, alcoholic hepatitis, cirrhosis admitted on 03/25/2016 with encephalopathy, seizure, acute kidney injury on CKD stage III.   Clinical Assessment and Goals of Care: [name] is resting quietly in bed. He makes eye contact and speaks to me as I enter. He is lethargic. At bedside today his sister Ahan Eisenberger. We talk about [name] functional status prior to this hospitalization. He had been hospitalized and only returned to his mother's home for 4 days. Solmon Ice states that [name] had been living in his mother's home since this hospitalization in August. We talk about labs including ammonia and albumin. I share my worry over [name] nutritional and functional status. We plan a family meeting for 11/1 at 14 AM. Hopefully present will be wife Meredith Mody, mother Blanch Media, brother Jeanell Sparrow, and sister Solmon Ice.  Healthcare power of attorney OTHER- [name] is able to make his own decisions at this time. His wife is his proxy Media planner.   SUMMARY OF RECOMMENDATIONS   continue full scope treatment at this time.  Sister, Ralpheal Zappone, states that her understanding is that [name] is on the list for transplantation.   Code Status/Advance Care Planning:  Full code at this time. Code status discussion to be held on 11/1.  Symptom Management:   per hospitalist  Palliative Prophylaxis:   Aspiration, Oral Care and Turn Reposition  Additional Recommendations (Limitations, Scope, Preferences):  Full Scope Treatment at this time. Family is anticipating meeting  with [name] for possible listing for transplantation.  Psycho-social/Spiritual:   Desire for further Chaplaincy support:no  Additional Recommendations: Caregiving  Support/Resources and Education on Hospice  Prognosis:   < 12 months, or less, based on's 4 hospitalizations in the last 6 months, functional decline, questionable continued alcohol use.  Discharge Planning: To Be Determined      Primary Diagnoses: Present on Admission: . Hepatic encephalopathy ([place]) . AKI (acute kidney injury) ([place]) . Alcoholic cirrhosis of liver with ascites ([place]) . Anasarca . Decompensation of cirrhosis of liver ([place]) . Encephalopathy, hepatic ([place])   I have reviewed the medical record, interviewed the patient and family, and examined the patient. The following aspects are pertinent.  Past Medical History:  Diagnosis Date  . Alcohol abuse   . Alcoholic hepatitis   . Cirrhosis with alcoholism [name]    Social History   Social History  . Marital status: Married    Spouse name: [name]  . Number of children: [name]  . Years of education: [name]   Social History Main Topics  . Smoking status: Former Smoker    Packs/day: 0.25    Types: Cigarettes    Quit date: 2016  . Smokeless tobacco: Never Used  .  Alcohol use No     Comment: former--quit after 12/2015 hospitalization  . Drug use: No  . Sexual activity: Yes   Other Topics Concern  . None   Social History Narrative  . None   Family History  Problem Relation Age of Onset  . Colon cancer Neg Hx   . Liver disease Neg Hx    Scheduled Meds: . chlorhexidine gluconate (MEDLINE KIT)  15 mL Mouth Rinse BID  . famotidine (PEPCID) IV  20 mg Intravenous Q24H  . furosemide  40 mg Intravenous Q12H  . lactulose  30 g Per Tube TID  . mouth rinse  15 mL Mouth Rinse 10 times per day  . metoprolol  5 mg Intravenous Q6H  . potassium chloride  40 mEq Per Tube BID  . potassium chloride  10 mEq Intravenous Q1 Hr x 6  . rifaximin  550 mg Per  Tube BID  . sodium chloride flush  3 mL Intravenous Q12H   Continuous Infusions: . sodium chloride 75 mL/hr at 03/27/16 0800  . fentaNYL infusion INTRAVENOUS 250 mcg/hr (03/27/16 0845)   PRN Meds:.fentaNYL (SUBLIMAZE) injection, midazolam Medications Prior to Admission:  Prior to Admission medications   Medication Sig Start Date End Date Taking? Authorizing Provider  Calcium Carb-Cholecalciferol (CALCIUM 600+D) 600-800 MG-UNIT TABS Take 1 tablet by mouth 2 (two) times daily.     Historical Provider, MD  folic acid (FOLVITE) 1 MG tablet Take 1 tablet (1 mg total) by mouth daily. 12/31/15   Orvan Falconer, MD  lactulose (CHRONULAC) 10 GM/15ML solution Take 30 mLs (20 g total) by mouth 3 (three) times daily. 01/17/16   Rexene Alberts, MD  magnesium oxide (MAG-OX) 400 (241.3 Mg) MG tablet Take 1 tablet (400 mg total) by mouth 2 (two) times daily. 01/17/16   Rexene Alberts, MD  MILK THISTLE PO Take 1 tablet by mouth daily.    Historical Provider, MD  Misc Natural Products (DANDELION ROOT PO) Take 1 tablet by mouth daily.    Historical Provider, MD  nadolol (CORGARD) 40 MG tablet Take 1 tablet (40 mg total) by mouth 2 (two) times daily. 12/31/15   Orvan Falconer, MD  pantoprazole (PROTONIX) 40 MG tablet Take 1 tablet (40 mg total) by mouth 2 (two) times daily before a meal. 02/08/16   Danie Binder, MD  potassium chloride SA (K-DUR,KLOR-CON) 20 MEQ tablet Take 20 mEq by mouth 2 (two) times daily.    Historical Provider, MD  rifaximin (XIFAXAN) 550 MG TABS tablet Take 1 tablet (550 mg total) by mouth 2 (two) times daily. Patient not taking: Reported on 02/08/2016 12/31/15   Orvan Falconer, MD  spironolactone (ALDACTONE) 100 MG tablet Take 0.5 tablets (50 mg total) by mouth daily. 01/17/16   Rexene Alberts, MD  thiamine 100 MG tablet Take 1 tablet (100 mg total) by mouth daily. 12/31/15   Orvan Falconer, MD  torsemide (DEMADEX) 20 MG tablet Take 1 tablet (20 mg total) by mouth daily. For treatment of swelling. 03/23/16   Samuella Cota, MD   No Known Allergies Review of Systems  Unable to perform ROS: Acuity of condition    Physical Exam  Constitutional: No distress.  Lethargic, makes but does not keep eye contact. Obese  HENT:  Head: Normocephalic and atraumatic.  Cardiovascular: Regular rhythm.   Rate sustained in the 120s  Pulmonary/Chest: Effort normal. No respiratory distress.  Abdominal: Soft. He exhibits no distension.  Obese abdomen  Musculoskeletal: He exhibits edema.  Neurological:  Lethargic,  makes but does not keep eye contact  Skin: Skin is warm and dry.  Nursing note and vitals reviewed.   Vital Signs: BP 107/64   Pulse 90   Temp 98.1 F (36.7 C)   Resp 12   Ht 6' 2" (1.88 m)   Wt 135.5 kg (298 lb 11.6 oz)   SpO2 100%   BMI 38.35 kg/m  Pain Assessment: CPOT   Pain Score: 0-No pain   SpO2: SpO2: 100 % O2 Device:SpO2: 100 % O2 Flow Rate: .O2 Flow Rate (L/min): 2 L/min  IO: Intake/output summary:  Intake/Output Summary (Last 24 hours) at 03/27/16 1232 Last data filed at 03/27/16 0800  Gross per 24 hour  Intake           2765.5 ml  Output             5350 ml  Net          -2584.5 ml    LBM: Last BM Date: 03/27/16 Baseline Weight: Weight: (!) 147.4 kg (325 lb) Most recent weight: Weight: 135.5 kg (298 lb 11.6 oz)     Palliative Assessment/Data:   Flowsheet Rows   Flowsheet Row Most Recent Value  Intake Tab  Referral Department  Hospitalist  Unit at Time of Referral  ICU  Palliative Care Primary Diagnosis  Other (Comment) [GI]  Date Notified  03/26/16  Palliative Care Type  Return patient Palliative Care  Reason for referral  Clarify Goals of Care  Date of Admission  03/25/16  Date first seen by Palliative Care  03/27/16  # of days Palliative referral response time  1 Day(s)  # of days IP prior to Palliative referral  1  Clinical Assessment  Palliative Performance Scale Score  30%  Pain Max last 24 hours  Not able to report  Pain Min Last 24 hours  Not able  to report  Dyspnea Max Last 24 Hours  Not able to report  Dyspnea Min Last 24 hours  Not able to report  Psychosocial & Spiritual Assessment  Palliative Care Outcomes      Time In: 1300 Time Out: 1335 Time Total: 35 minutes Greater than 50%  of this time was spent counseling and coordinating care related to the above assessment and plan.  Signed by: Drue Novel, NP   Please contact Palliative Medicine Team phone at [phone] for questions and concerns.  For individual provider: See Shea Evans

## 2016-03-27 NOTE — Progress Notes (Addendum)
Discussed his situation with nursing staff who were taking care of him last night. He is substantially better as far as his mental status is concerned. He is following commands. He is still on the ventilator for airway protection but with his improvement in mental status we may be able to get him off today. No other complaints have been noted but this is limited by his intubated status.  Physical examination shows that he is awake. He is intubated. He has on minutes to keep him from pulling out his support apparatus out. He does follow commands. Mucous membranes are moist. His neck is supple. His chest shows diminished breath sounds but no wheezing. His heart is regular without gallop. He has a protuberant abdomen with questionable ascites. No clubbing. Central nervous system examination is as noted  He is intubated for airway protection and hypoxic respiratory failure. He has cirrhosis of the liver and hepatic encephalopathy which is improving. We may have opportunity to get him extubated today. If he is able to follow commands and able to manage his secretions will plan weaning and extubation

## 2016-03-27 NOTE — Progress Notes (Signed)
Initial Nutrition Assessment  DOCUMENTATION CODES:  Obese    INTERVENTION:  If able to wean as expected today: recommend advance diet as tolerated to Heart Healthy  Provide snacks TID at 1000, 1400 and 2000 daily   NUTRITION DIAGNOSIS:   Increased nutrient needs related to chronic illness (liver disease)as evidenced by estimated needs.  Inadequate oral intake related to inability to eat as evidenced by current vent status / NPO   GOAL:   Patient will meet greater than or equal to 90% of their needs if congruent with his healthcare wishes. Palliative consult pending to discuss.   MONITOR:   PO intake, Supplement acceptance, I & O's, Weight trends, Labs  REASON FOR ASSESSMENT:   Ventilator     ASSESSMENT:  Patient is currently intubated on ventilator support for airway protection but in process of weaning now. His estimated needs reflective of independent respiration. His wife is here with him.  His appetite has been poor. He has been re-admitted following a recent hospitalization due to decompensated cirrhosis. Hx includes end-stage liver disease, anasarca, GI bleed, esophageal varices. He remains edematous. His weight is down 12 kg since discharge on 10/27.  Palliative consult pending.  Recent Labs Lab 03/21/16 0612 03/22/16 0845  03/25/16 0343 03/26/16 0931 03/27/16 0406  NA 134* 132*  < > 136 145 147*  K 2.7* 2.9*  < > 4.2 2.6* 3.0*  CL 95* 94*  < > 99* 110 114*  CO2 32 31  < > 26 24 24   BUN 56* 59*  < > 67* 68* 61*  CREATININE 1.65* 1.73*  < > 2.55* 2.41* 2.05*  CALCIUM 8.3* 8.6*  < > 8.9 9.0 8.9  MG  --  1.4*  --  1.7  --  1.6*  PHOS 4.0  --   --   --   --   --   GLUCOSE 86 77  < > 109* 92 94  < > = values in this interval not displayed.  Labs: BUN-61, Cr. 2.05, Sodium 147, potassium 3.0, Cl 114.  Meds: Lasix, PPI, Lopressor  Diet Order:  Diet NPO time specified  Skin:   pitting edema  Last BM:  10/30 loose, large, brown stool  Height:   Ht  Readings from Last 1 Encounters:  03/25/16 6\' 2"  (1.88 m)    Weight:   Wt Readings from Last 1 Encounters:  03/27/16 298 lb 11.6 oz (135.5 kg)    Ideal Body Weight:  86 kg  BMI:  Body mass index is 38.35 kg/m.   Estimated Nutritional Needs:   Kcal:  [id number]   Protein:  120-140 gr  Fluid:  per MD goals  EDUCATION NEEDS:   No education needs identified at this time  Colman Cater MS,RD,CSG,LDN Office: [phone] Pager: [phone]

## 2016-03-28 DIAGNOSIS — K7290 Hepatic failure, unspecified without coma: Secondary | ICD-10-CM

## 2016-03-28 LAB — BASIC METABOLIC PANEL
ANION GAP: 7 (ref 5–15)
BUN: 48 mg/dL — ABNORMAL HIGH (ref 6–20)
CHLORIDE: 116 mmol/L — AB (ref 101–111)
CO2: 26 mmol/L (ref 22–32)
Calcium: 9.3 mg/dL (ref 8.9–10.3)
Creatinine, Ser: 1.69 mg/dL — ABNORMAL HIGH (ref 0.61–1.24)
GFR calc non Af Amer: 48 mL/min — ABNORMAL LOW (ref >60)
GFR, EST AFRICAN AMERICAN: 56 mL/min — AB (ref >60)
Glucose, Bld: 87 mg/dL (ref 65–99)
POTASSIUM: 3.5 mmol/L (ref 3.5–5.1)
SODIUM: 149 mmol/L — AB (ref 135–145)

## 2016-03-28 LAB — MAGNESIUM: Magnesium: 1.6 mg/dL — ABNORMAL LOW (ref 1.7–2.4)

## 2016-03-28 LAB — AMMONIA: AMMONIA: 27 umol/L (ref 9–35)

## 2016-03-28 MED ORDER — DIPHENHYDRAMINE-ZINC ACETATE 2-0.1 % EX CREA
TOPICAL_CREAM | Freq: Two times a day (BID) | CUTANEOUS | Status: DC | PRN
  Filled 2016-03-28: qty 28

## 2016-03-28 MED ORDER — PANTOPRAZOLE SODIUM 40 MG PO TBEC
40.0000 mg | DELAYED_RELEASE_TABLET | Freq: Two times a day (BID) | ORAL | Status: DC
  Administered 2016-03-28 – 2016-03-29 (×2): 40 mg via ORAL
  Filled 2016-03-28 (×2): qty 1

## 2016-03-28 MED ORDER — TORSEMIDE 20 MG PO TABS
60.0000 mg | ORAL_TABLET | Freq: Every day | ORAL | Status: DC
  Administered 2016-03-28 – 2016-03-29 (×2): 60 mg via ORAL
  Filled 2016-03-28 (×2): qty 3

## 2016-03-28 MED ORDER — NADOLOL 40 MG PO TABS
40.0000 mg | ORAL_TABLET | Freq: Two times a day (BID) | ORAL | Status: DC
  Administered 2016-03-28 – 2016-03-29 (×3): 40 mg via ORAL
  Filled 2016-03-28 (×5): qty 1

## 2016-03-28 MED ORDER — MAGNESIUM OXIDE 400 (241.3 MG) MG PO TABS
400.0000 mg | ORAL_TABLET | Freq: Two times a day (BID) | ORAL | Status: DC
  Administered 2016-03-28 – 2016-03-29 (×3): 400 mg via ORAL
  Filled 2016-03-28 (×3): qty 1

## 2016-03-28 MED ORDER — POTASSIUM CHLORIDE CRYS ER 20 MEQ PO TBCR
20.0000 meq | EXTENDED_RELEASE_TABLET | Freq: Two times a day (BID) | ORAL | Status: DC
  Administered 2016-03-28 – 2016-03-29 (×3): 20 meq via ORAL
  Filled 2016-03-28 (×3): qty 1

## 2016-03-28 MED ORDER — VITAMIN B-1 100 MG PO TABS
100.0000 mg | ORAL_TABLET | Freq: Every day | ORAL | Status: DC
  Administered 2016-03-28 – 2016-03-29 (×2): 100 mg via ORAL
  Filled 2016-03-28 (×2): qty 1

## 2016-03-28 MED ORDER — LACTULOSE 10 GM/15ML PO SOLN
20.0000 g | Freq: Three times a day (TID) | ORAL | Status: DC
  Administered 2016-03-28 – 2016-03-29 (×4): 20 g via ORAL
  Filled 2016-03-28 (×3): qty 30

## 2016-03-28 MED ORDER — SPIRONOLACTONE 25 MG PO TABS
50.0000 mg | ORAL_TABLET | Freq: Every day | ORAL | Status: DC
  Administered 2016-03-28 – 2016-03-29 (×2): 50 mg via ORAL
  Filled 2016-03-28 (×2): qty 2

## 2016-03-28 MED ORDER — FOLIC ACID 1 MG PO TABS
1.0000 mg | ORAL_TABLET | Freq: Every day | ORAL | Status: DC
  Administered 2016-03-28 – 2016-03-29 (×2): 1 mg via ORAL
  Filled 2016-03-28 (×2): qty 1

## 2016-03-28 NOTE — Progress Notes (Signed)
Daily Progress Note   Patient Name: Antonio [name]       Date: 03/28/2016 DOB: 1971-01-19  Age: 45 y.o. MRN#: [id number] Attending Physician: Antonio [name]* Primary Care Physician: Antonio Lair, MD Admit Date: 03/25/2016  Reason for Consultation/Follow-up: Establishing goals of care and Psychosocial/spiritual support  Subjective: Antonio [name] is sitting up in the [name] chair, he greets me, making, but not keeping eye contact.  Present today are wife Antonio [name], mother Antonio [name], and sister Antonio [name]. We talk about his labs including ammonia and albumin. We talk about the reasons his albumin is low and measures to increase protein. We talk about ammonia and the importance of taking lactulose as scheduled. We talk about his chronic and acute health problems. We also talk about his functional status. Antonio [name] had been living in his mother's home, his wife is working full-time.  We talk about his appointment with [facility] for transplant evaluation. His mother and sister, and possibly his wife, will attend this appointment with him.  We talk about the rigors of preparing for transplantation, including psychological exams, physical exams, and emotional readiness. I share that a transplanted liver is expected to last 5 to 10 years.  We talk about the importance of NO alcohol. [name] becomes upset when I mentioned that he was tested positive for alcohol he states repeatedly that he did not drink, and his family suggest that he may have ingested some Listerine with alcohol. His mother states that she has removed this from the home.  We talk about advanced directives, no paperwork has being completed. I share that legally, Antonio [name] is the decision maker, Antonio [name] wants his family to make choices together. We also talk  about advanced directives. [name] states that he remembers intubation/ventilation, and is okay with this intervention again.  His mother follows me out to the family waiting room. She shares her concerns that Antonio [name] wife, Antonio [name], continues to drink alcohol and he is at risk for relapse when he is with her. We talk about support systems, and ways to encourage Antonio [name] to not relapse. I share that any alcohol use could be deadly. Antonio [name] and I talk about advanced directives, and I share that it's important for her to listen to the medical team, and that what may be right for him today may  not be the best option for him as time goes along. I encourage family to reach out to me at any time.  Length of Stay: 3  Current Medications: Scheduled Meds:  . chlorhexidine gluconate (MEDLINE KIT)  15 mL Mouth Rinse BID  . folic acid  1 mg Oral Daily  . lactulose  20 g Oral TID  . magnesium oxide  400 mg Oral BID  . nadolol  40 mg Oral BID  . pantoprazole  40 mg Oral BID AC  . potassium chloride SA  20 mEq Oral BID  . rifaximin  550 mg Per Tube BID  . sodium chloride flush  3 mL Intravenous Q12H  . spironolactone  50 mg Oral Daily  . thiamine  100 mg Oral Daily  . torsemide  60 mg Oral Daily    Continuous Infusions: . sodium chloride 75 mL/hr at 03/28/16 0600    PRN Meds:   Physical Exam  Constitutional: No distress.  Obese, looks older than stated age, makes but does not keep eye contact.  HENT:  Head: Normocephalic and atraumatic.  Cardiovascular: Normal rate and regular rhythm.   Pulmonary/Chest: Effort normal. No respiratory distress.  Abdominal: Soft. He exhibits no distension.  Neurological: He is alert.  Skin: Skin is warm and dry.  Nursing note and vitals reviewed.           Vital Signs: BP 112/74 Comment: At end of PT, after ambulation  Pulse (!) 125 Comment: At end of PT, after ambulation  Temp (!) 96.8 F (36 C) (Axillary)   Resp 16   Ht '6\' 2"'$  (1.88 m)   Wt 135.5 kg  (298 lb 11.6 oz)   SpO2 100% Comment: At end of PT, after ambulation  BMI 38.35 kg/m  SpO2: SpO2: 100 % (At end of PT, after ambulation) O2 Device: O2 Device: Not Delivered O2 Flow Rate: O2 Flow Rate (L/min): 2 L/min  Intake/output summary:  Intake/Output Summary (Last 24 hours) at 03/28/16 1229 Last data filed at 03/28/16 0454  Gross per 24 hour  Intake          2344.42 ml  Output             7225 ml  Net         -4880.58 ml   LBM: Last BM Date: 03/28/16 Baseline Weight: Weight: (!) 147.4 kg (325 lb) Most recent weight: Weight: 135.5 kg (298 lb 11.6 oz)       Palliative Assessment/Data:    Flowsheet Rows   Flowsheet Row Most Recent Value  Intake Tab  Referral Department  Hospitalist  Unit at Time of Referral  ICU  Palliative Care Primary Diagnosis  Other (Comment) [GI]  Date Notified  03/26/16  Palliative Care Type  Return patient Palliative Care  Reason for referral  Clarify Goals of Care  Date of Admission  03/25/16  Date first seen by Palliative Care  03/27/16  # of days Palliative referral response time  1 Day(s)  # of days IP prior to Palliative referral  1  Clinical Assessment  Palliative Performance Scale Score  30%  Pain Max last 24 hours  Not able to report  Pain Min Last 24 hours  Not able to report  Dyspnea Max Last 24 Hours  Not able to report  Dyspnea Min Last 24 hours  Not able to report  Psychosocial & Spiritual Assessment  Palliative Care Outcomes  Patient/Family meeting held?  Yes  Who was at the meeting?  With  sister Antonio [name] - [facility]  Palliative Care Outcomes  Provided advance care planning, Provided psychosocial or spiritual support  Palliative Care follow-up planned  -- [Follow-up while at [facility]]      Patient Active Problem List   Diagnosis Date Noted  . Hepatic encephalopathy ([place]) 03/25/2016  . Seizures ([place]) 03/25/2016  . ATN (acute tubular necrosis) ([place])   . Elevated brain natriuretic peptide (BNP) level   . Rectal bleeding   . AKI (acute  kidney injury) ([place]) 03/06/2016  . Hyperglycemia 03/06/2016  . Protein-calorie malnutrition, severe ([place]) 03/06/2016  . CHF (congestive heart failure) ([place]) 03/06/2016  . Palliative care encounter   . DNR (do not resuscitate) discussion   . Goals of care, counseling/discussion   . Edema of upper extremity   . Lower extremity edema   . Coagulopathy ([place]) 01/11/2016  . Encephalopathy, hepatic ([place]) 01/10/2016  . Thrombocytopenia ([place]) 01/08/2016  . Anasarca 01/04/2016  . DOE (dyspnea on exertion)   . Peripheral edema   . Anemia 12/27/2015  . Alcohol abuse 12/27/2015  . Decompensation of cirrhosis of liver ([place]) 12/27/2015  . Alcoholic cirrhosis of liver with ascites [name])     Palliative Care Assessment & Plan   Patient Profile: 45 y.o. male  with past medical history of Alcohol abuse, alcoholic hepatitis, cirrhosis admitted on 03/25/2016 with encephalopathy, seizure, acute kidney injury on CKD stage III.   Assessment: Increased ammonia: we discussed the importance of taking lactulose. I encourage Mr. Sox to listen to his family, if they share that he is confused and needs more lactulose he should accept their help. ESLD: appointment with [facility] 11/6. Mother Antonio [name], sister Antonio [name], possibly wife Antonio [name] will attend initial meeting. We talk about monthly visits, counseling, and lab work. I share that Mr. Fehnel will not be eligible for transplant list until this process is completed. I share that people who receive liver transplants are usually able to keep the liver 5 to 10 years.  Recommendations/Plan:  continue to treat the treatable, [facility] for transplant evaluation on 11/16 (transportation in [place]), we discuss healthcare power of attorney, and advanced directives.  Mr. Ellery states that he would accept intubation/ventilation again in the future.  Goals of Care and Additional Recommendations:  Limitations on Scope of Treatment: Full Scope Treatment  Code Status:    Code Status  Orders        Start     Ordered   03/25/16 0857  Full code  Continuous     03/25/16 0858    Code Status History    Date Active Date Inactive Code Status Order ID Comments User Context   03/06/2016  5:34 PM 03/23/2016  6:25 PM Full Code [id number]  Karmen Bongo, MD Inpatient   01/04/2016  2:17 AM 01/17/2016  6:41 PM Full Code [id number]  Phillips Grout, MD Inpatient   12/27/2015  2:41 AM 12/31/2015  6:43 PM Full Code [id number]  Oswald Hillock, MD Inpatient       Prognosis:   < 12 months, possibly 6 months or less, based on 4 hospitalizations in the last 6 months, decreased functional status. This would change, of course, based on transplantation.  Discharge Planning:  Home with [place] was discussed with nursing staff, case manager, social worker, Dr. Trinda Pascal G.I., and Dr. Candyce Churn.  Thank you for allowing the Palliative Medicine Team to assist in the care of this patient.   Time In: 1100 Time Out: 1150 Total Time 50 Minutes  Prolonged Time Billed  yes       Greater than 50%  of this time was spent counseling and coordinating care related to the above assessment and plan.  Drue Novel, NP  Please contact Palliative Medicine Team phone at [phone] for questions and concerns.

## 2016-03-28 NOTE — Evaluation (Signed)
Physical Therapy Evaluation Patient Details Name: Abdulkadir Mcaulay MRN: [id number] DOB: 10-02-1970 Today's Date: 03/28/2016   History of Present Illness  45 y.o. male  with past medical history of Alcohol abuse, alcoholic hepatitis, cirrhosis admitted on 03/25/2016 with encephalopathy, seizure, acute kidney injury on CKD stage III.  Intubated 10/29 for airway protection and hypoxic respiratory failure.  Extubated 03/27/2016.  Pt was recently discharged from the hospital on 03/23/2016.    Clinical Impression  Pt received in bed, multiple family member present, and pt is agreeable to PT evaluation.  Pt states that he has 24/7 supervision/assistance between his wife and his mother at home.  He is normally independent with ambulation, however it was noted that upon his last admission the PT evaluation expressed that he fatigued quickly, and required multiple rest breaks.  Pt was recommended for f/u OPPT at that point, but this did not occur.    During today's PT evaluation, pt is very impulsive with mobility, and requires multiple cues to wait for PT to get lines/tubes prepared for mobility.  Pt was able to ambulate 246ft with RW and Min A, however, he requires assistance for RW navigation and it was noted that his LE's were trembly.  Pt is recommended to have f/u HHPT, however he expresses that he is "fine at home."  Pt continues to be a high fall risk due to multiple recent hospitalizations, as well as immobility from being on mechanical ventillation.  Strongly recommend f/u HHPT if pt is willing.      Follow Up Recommendations Home health PT;Supervision/Assistance - 24 hour (Pt is recommended to use his RW upon d/c. )    Equipment Recommendations  None recommended by PT    Recommendations for Other Services       Precautions / Restrictions Precautions Precautions: Fall;Other (comment) (Rectal tube) Precaution Comments: Due to multiple hospitalizations, and downplaying his balance deficits and  functional mobility.  Restrictions Weight Bearing Restrictions: No      Mobility  Bed Mobility Overal bed mobility: Modified Independent                Transfers Overall transfer level: Needs assistance Equipment used: Rolling walker (2 wheeled) Transfers: Sit to/from Stand Sit to Stand: Supervision (vc's for hand placement)            Ambulation/Gait Ambulation/Gait assistance: Min assist Ambulation Distance (Feet): 200 Feet Assistive device: Rolling walker (2 wheeled) Gait Pattern/deviations: Wide base of support;Step-through pattern;Trunk flexed   Gait velocity interpretation: <1.8 ft/sec, indicative of risk for recurrent falls General Gait Details: Pt requires assistance for RW navigation, and multiple cues to keep feet inside base of RW due to having very wide stance.  Pt demonstrates greatest difficulties when turning or changing direction.  1 standing rest break where he impulsively grabbed a sucker from the nurses station.  Danae Chen, RN informed.  LE's noted to be slightly tremulous, however pt minimizes this.  Stairs            Wheelchair Mobility    Modified Rankin (Stroke Patients Only)       Balance Overall balance assessment: Needs assistance Sitting-balance support: Bilateral upper extremity supported;Feet supported Sitting balance-Leahy Scale: Good     Standing balance support: Bilateral upper extremity supported Standing balance-Leahy Scale: Fair                               Pertinent Vitals/Pain Pain Assessment: No/denies pain  Home Living   Living Arrangements: Spouse/significant other (mom) Available Help at Discharge: Family;Available 24 hours/day (Pt states that if his wife is not there, then his mom is there with him. ) Type of Home: [place]: Walker - 2 wheels;Wheelchair - manual      Prior Function Level of Independence: Independent         Comments: INDEP with basic ADL;  some help from family with meals/IADL.      Hand Dominance   Dominant Hand: Right    Extremity/Trunk Assessment   Upper Extremity Assessment: Overall WFL for tasks assessed           Lower Extremity Assessment: Generalized weakness         Communication   Communication: No difficulties  Cognition Arousal/Alertness: Awake/alert Behavior During Therapy: Impulsive Overall Cognitive Status: Within Functional Limits for tasks assessed                      General Comments      Exercises     Assessment/Plan    PT Assessment Patient needs continued PT services  PT Problem List Decreased strength;Cardiopulmonary status limiting activity;Decreased activity tolerance;Decreased mobility;Decreased balance;Decreased safety awareness;Decreased knowledge of precautions;Obesity          PT Treatment Interventions Gait training;Functional mobility training;Therapeutic activities;Patient/family education;Therapeutic exercise;DME instruction;Balance training    PT Goals (Current goals can be found in the Care Plan section)  Acute Rehab PT Goals Patient Stated Goal: return to baseline mobility PT Goal Formulation: With patient Time For Goal Achievement: 04/05/16 Potential to Achieve Goals: Fair    Frequency Min 2X/week   Barriers to discharge        Co-evaluation               End of Session Equipment Utilized During Treatment: Gait belt Activity Tolerance: Patient tolerated treatment well Patient left: in chair;with call bell/phone within reach;with family/visitor present Nurse Communication: Mobility status Danae Chen, RN notified of pt's mobility status, and his location )         Time: [id number] PT Time Calculation (min) (ACUTE ONLY): 36 min   Charges:   PT Evaluation $PT Eval Moderate Complexity: 1 Procedure PT Treatments $Gait Training: 8-22 mins   PT G Codes:        Beth Mj Willis, PT, DPT X: [phone]

## 2016-03-28 NOTE — Clinical Social Work Note (Signed)
CSW received consult. No comments were listed indicating CSW needs. CSW spoke with patient's nurse and was advised that there were no know CSW needs.  CSW advised to reconsult and list comments if patient has CSW needs.      Colvin Blatt, Clydene Pugh, LCSW

## 2016-03-28 NOTE — Progress Notes (Signed)
In to see patient at this time, patient noted to have central line in hand.  Patient states that it was an accident, and that he didn't mean to pull it out. Pressure immediately held to site, vaseline gauze applied, and 4x4 gauze placed over until hemostasis achieved.  Patient layed flat for 15 min and pressure dressing applied to site.  MD Opyd notified at this time. Patient remained hemodynamically stable with no signs or symptoms of complications. Will continue to monitor the patient.

## 2016-03-28 NOTE — Care Management Note (Addendum)
Case Management Note  Patient Details  Name: Antonio [name] MRN: [id number] Date of Birth: Apr 22, 1971  Subjective/Objective:                  Pt is from home, he is ind with ADL's. He is married but has been staying with his parents since his recent decline in health. His wife is at the bedside. Pt has refused HH and OP services in the past. PT has recommended HH. CM discussed PT recommendations and pt's frequent hospitalizations with pt and wife. Pt says he will "think" HH at DC. Pt's wife will encourage pt.  PT recommended use of RW and WC, pt has both PTA.   Action/Plan: Will cont to follow for DC planning needs.      Expected Discharge Date:    03/29/2016              Expected Discharge Plan:  [place]  In-House Referral:  NA  Discharge planning Services    CM  Post Acute Care Choice:  Home Health Choice offered to:  Patient, Spouse  Status of Service:  In process, will continue to follow   Sherald Barge, RN 03/28/2016, 1:32 PM

## 2016-03-28 NOTE — Progress Notes (Signed)
Pt a/o.vss. Up sitting in the chair this am. IV patent. No complaints of any distress. Report called to [place], RN. Pt to be transferred to room 323.

## 2016-03-28 NOTE — Progress Notes (Signed)
PROGRESS NOTE  Antonio [name] MRN:[id number] DOB: 03/20/1971 DOA: 03/25/2016 PCP: TAPPER,DAVID B, MD  Brief Narrative: 45-year-old man with end-stage liver disease, just hospitalized for 2 weeks with discharge 10/27 for decompensated end-stage liver disease, anasarca requiring prolonged diuresis and anemia secondary to GI bleed. EGD and colonoscopy without acute findings at that time.  Presented 10/29 with altered mental status and reported seizure at home. In the emergency department patient had nonfocal neurologic exam but could not protect airway and therefore was intubated for airway protection. Abnormalities included hypothermia, ammonia 307. Admitted for acute hepatic encephalopathy, marked hyperammonemia, acute kidney injury superimposed on chronic kidney disease, atrial fibrillation/flutter with rapid ventricular response, decompensated end-stage liver disease. As of 10/31 he seems to be improving with resolution of hyperammonemia. Extubated 10/31.  Assessment/Plan: 1. Acute hepatic encephalopathy with marked hyperammonemia, with associated seizure as an outpatient. Ammonia has returned normal. Mental status seems back to baseline (wife agrees). Wife believes he missed at least 2 doses of lactulose, which may have precipitated this hospitalization and his state on admission. 2. Reported seizure prior to admission. By wife's description this may have been seizure-like activity rather than true seizure. He's had no seizure activity during this hospitalization. Suspect this is secondary to marked encephalopathy rather than new seizure onset. No antiepileptics unless has further seizures. 3. Acute kidney injury superimposed on CKD stage III, actually improved with diuresis. Continue to follow high/output. Continue IV Lasix. 4. 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 MELD 29 =  19.6% 3-month mortality. Patient and family nor interested in palliation. Apparently patient is on the list for liver transplantation. 5. Acute blood loss anemia status post GI bleed approximately 2 weeks ago. Underwent colonoscopy and EGD October 12 with removal of one polyp from descending colon and one from cecum. Rectal varices noted. EGD showed grade 2 esophageal varices, portal hypertensive gastropathy. Has some bleeding from the mouth on the day of admission. No GI bleeding or bleeding from airway. Seems to have resolved at this point. 6. Atrial fibrillation/flutter. Diagnosed last admission. Echocardiogram on file August 2017. No significant valvular abnormalities. Normal LVEF EF at that time. TSH WNL. Not a candidate for anticoagulation at this point given recent GI bleed. Continue metoprolol for rate control. 7. Morbid obesity. 8. Demand ischemia secondary to acute illness. No further evaluation suggested.   Seems to be improving with resolution of hyperammonemia. Ammonia has returned to normal. Hemodynamics stable heart rate is controlled. Blood cultures no growth today. No evidence of infection. Abx discontinued 10/31.  Replace magnesium & potassium  Monitor I/O. Transition to home dose of torsemide.  Change to oral beta blocker 11/1.   DVT prophylaxis: SCDs.  Code Status: full code Family Communication: wife at bedside Disposition Plan: pending PT assessment today  Antonio Hernandez, MD  [facility] Direct contact: [phone] --Via amion app OR  --[email or web address]; password [email or web address]  7PM-7AM contact night coverage as above 03/28/2016, 8:44 AM  LOS: 3 days   Consultants:  Pulmonology  Procedures:  ETT 10/29 >>  Antimicrobials:  Zosyn 10/29 >>  10/31  Vancomycin 10/29 >> 10/31   Interval history/Subjective: No issues charted overnight.  Improving. Wife at bedside, reports he is back to baseline mentation.  Objective: Vitals:   03/28/16 0300 03/28/16 0400  03/28/16 0600 03/28/16 0732  BP: 103/65 113/66 118/85 107/70  Pulse: 94 92 (!) 119 92  Resp: 20 13 11 (!) 8  Temp: 97.2 F (36.2 C) 97   F (36.1 C) 97.2 F (36.2 C) 97 F (36.1 C)  TempSrc: Oral Oral Oral   SpO2: 100% 100% 100% 100%  Weight:      Height:        Intake/Output Summary (Last 24 hours) at 03/28/16 0844 Last data filed at 03/28/16 0600  Gross per 24 hour  Intake          2304.42 ml  Output             7225 ml  Net         -4920.58 ml     Filed Weights   03/25/16 0815 03/26/16 0500 03/27/16 0500  Weight: (!) 144.3 kg (318 lb 2 oz) (!) 139.3 kg (307 lb 1.6 oz) 135.5 kg (298 lb 11.6 oz)    Exam: Constitutional:   AA Ox3 Eyes:   Pupils equal, round, reactive to light. Normal lids, conjunctiva. ENMT:   Moist mucous membranes Respiratory:   Clear to auscultation bilaterally. No wheezes, rales or rhonchi. Normal respiratory effort. Cardiovascular:   Regular rate and rhythm. No murmur, rub or gallop.  2-3 plus edema bilateral lower extremities Skin:   Appears grossly unremarkable Neurologic:   Non-focal, grossly intact Psychiatric:   Unassessable on sedation   I have personally reviewed following labs and imaging studies:  Scheduled Meds: . chlorhexidine gluconate (MEDLINE KIT)  15 mL Mouth Rinse BID  . famotidine (PEPCID) IV  20 mg Intravenous Q24H  . furosemide  40 mg Intravenous Q12H  . lactulose  30 g Per Tube TID  . metoprolol  10 mg Intravenous Q6H  . rifaximin  550 mg Per Tube BID  . sodium chloride flush  3 mL Intravenous Q12H   Continuous Infusions: . sodium chloride 75 mL/hr at 03/28/16 0600    Principal Problem:   Hepatic encephalopathy (H[facility]) Active Problems:   Decompensation of cirrhosis of liver ([facility])   Alcoholic cirrhosis of liver with ascites (HCC)   Anasarca   Encephalopathy, hepatic (HCC)   AKI (acute kidney injury) (HCC)   Seizures (HCC)   LOS: 3 days    Antonio Hernandez, MD [facility] Pager:  [phone]  

## 2016-03-28 NOTE — Progress Notes (Signed)
Subjective: He has remained off the ventilator. As noted he pulled out his central line. He says he doesn't have any breathing issues. No complaints of shortness of breath and he says at home he does well.  Objective: Vital signs in last 24 hours: Temp:  [97 F (36.1 C)-98.1 F (36.7 C)] 97 F (36.1 C) (11/01 0732) Pulse Rate:  [85-121] 92 (11/01 0732) Resp:  [7-20] 8 (11/01 0732) BP: (88-118)/(49-85) 107/70 (11/01 0732) SpO2:  [100 %] 100 % (11/01 0732) FiO2 (%):  [35 %] 35 % (10/31 0849) Weight change:  Last BM Date: 03/27/16  Intake/Output from previous day: 10/31 0701 - 11/01 0700 In: 3129.4 [I.V.:2529.4; IV Piggyback:600] Out: 7225 [Urine:6225; Stool:1000]  PHYSICAL EXAM General appearance: alert, cooperative and no distress Resp: clear to auscultation bilaterally Cardio: regular rate and rhythm, S1, S2 normal, no murmur, click, rub or gallop GI: Abdomen soft with what appears to be ascites Extremities: Generalized puffiness  Lab Results:  Results for orders placed or performed during the hospital encounter of 03/25/16 (from the past 48 hour(s))  Comprehensive metabolic panel     Status: Abnormal   Collection Time: 03/26/16  9:31 AM  Result Value Ref Range   Sodium 145 135 - 145 mmol/L    Comment: DELTA CHECK NOTED   Potassium 2.6 (LL) 3.5 - 5.1 mmol/L    Comment: CRITICAL RESULT CALLED TO, READ BACK BY AND VERIFIED WITH: HYLTON,L AT 10:15AM ON 03/26/16 BY FESTERMAN,C    Chloride 110 101 - 111 mmol/L   CO2 24 22 - 32 mmol/L   Glucose, Bld 92 65 - 99 mg/dL   BUN 68 (H) 6 - 20 mg/dL   Creatinine, Ser 2.41 (H) 0.61 - 1.24 mg/dL   Calcium 9.0 8.9 - 10.3 mg/dL   Total Protein 7.2 6.5 - 8.1 g/dL   Albumin 2.3 (L) 3.5 - 5.0 g/dL   AST 82 (H) 15 - 41 U/L   ALT 27 17 - 63 U/L   Alkaline Phosphatase 115 38 - 126 U/L   Total Bilirubin 6.8 (H) 0.3 - 1.2 mg/dL   GFR calc non Af Amer 31 (L) >60 mL/min   GFR calc Af Amer 36 (L) >60 mL/min    Comment: (NOTE) The eGFR has  been calculated using the CKD EPI equation. This calculation has not been validated in all clinical situations. eGFR's persistently <60 mL/min signify possible Chronic Kidney Disease.    Anion gap 11 5 - 15  CBC     Status: Abnormal   Collection Time: 03/26/16  9:31 AM  Result Value Ref Range   WBC 10.8 (H) 4.0 - 10.5 K/uL   RBC 2.76 (L) 4.22 - 5.81 MIL/uL   Hemoglobin 8.1 (L) 13.0 - 17.0 g/dL   HCT 25.3 (L) 39.0 - 52.0 %   MCV 91.7 78.0 - 100.0 fL   MCH 29.3 26.0 - 34.0 pg   MCHC 32.0 30.0 - 36.0 g/dL   RDW 20.2 (H) 11.5 - 15.5 %   Platelets 152 150 - 400 K/uL  Ammonia     Status: Abnormal   Collection Time: 03/26/16  9:31 AM  Result Value Ref Range   Ammonia 53 (H) 9 - 35 umol/L  Blood gas, arterial     Status: Abnormal   Collection Time: 03/26/16 12:30 PM  Result Value Ref Range   FIO2 0.35    Delivery systems VENTILATOR    Mode PRESSURE REGULATED VOLUME CONTROL    VT 650 mL   LHR  12 resp/min   Peep/cpap 5.0 cm H20   pH, Arterial 7.486 (H) 7.350 - 7.450   pCO2 arterial 32.9 32.0 - 48.0 mmHg   pO2, Arterial 149 (H) 83.0 - 108.0 mmHg   Bicarbonate 26.0 20.0 - 28.0 mmol/L   Acid-Base Excess 1.4 0.0 - 2.0 mmol/L   O2 Saturation 99.3 %   Patient temperature 37.0    Collection site RIGHT RADIAL    Drawn by [id number]    Sample type ARTERIAL DRAW    Allens test (pass/fail) PASS PASS  Basic metabolic panel     Status: Abnormal   Collection Time: 03/27/16  4:06 AM  Result Value Ref Range   Sodium 147 (H) 135 - 145 mmol/L   Potassium 3.0 (L) 3.5 - 5.1 mmol/L   Chloride 114 (H) 101 - 111 mmol/L   CO2 24 22 - 32 mmol/L   Glucose, Bld 94 65 - 99 mg/dL   BUN 61 (H) 6 - 20 mg/dL   Creatinine, Ser 2.05 (H) 0.61 - 1.24 mg/dL   Calcium 8.9 8.9 - 10.3 mg/dL   GFR calc non Af Amer 38 (L) >60 mL/min   GFR calc Af Amer 44 (L) >60 mL/min    Comment: (NOTE) The eGFR has been calculated using the CKD EPI equation. This calculation has not been validated in all clinical  situations. eGFR's persistently <60 mL/min signify possible Chronic Kidney Disease.    Anion gap 9 5 - 15  CBC     Status: Abnormal   Collection Time: 03/27/16  4:06 AM  Result Value Ref Range   WBC 10.3 4.0 - 10.5 K/uL   RBC 2.71 (L) 4.22 - 5.81 MIL/uL   Hemoglobin 7.9 (L) 13.0 - 17.0 g/dL   HCT 25.4 (L) 39.0 - 52.0 %   MCV 93.7 78.0 - 100.0 fL   MCH 29.2 26.0 - 34.0 pg   MCHC 31.1 30.0 - 36.0 g/dL   RDW 20.0 (H) 11.5 - 15.5 %   Platelets 137 (L) 150 - 400 K/uL  Ammonia     Status: None   Collection Time: 03/27/16  4:06 AM  Result Value Ref Range   Ammonia 28 9 - 35 umol/L  Magnesium     Status: Abnormal   Collection Time: 03/27/16  4:06 AM  Result Value Ref Range   Magnesium 1.6 (L) 1.7 - 2.4 mg/dL  Blood gas, arterial     Status: Abnormal   Collection Time: 03/27/16  4:12 AM  Result Value Ref Range   FIO2 35.00    Delivery systems VENTILATOR    Mode PRESSURE REGULATED VOLUME CONTROL    VT 650 mL   LHR 12 resp/min   Peep/cpap 5.0 cm H20   pH, Arterial 7.445 7.350 - 7.450   pCO2 arterial 36.6 32.0 - 48.0 mmHg   pO2, Arterial 152.0 (H) 83.0 - 108.0 mmHg   Bicarbonate 25.5 20.0 - 28.0 mmol/L   O2 Saturation 99.4 %   Patient temperature 98.9    Collection site LEFT RADIAL    Drawn by [id number]    Sample type ARTERIAL    Allens test (pass/fail) PASS PASS  Blood gas, arterial     Status: Abnormal   Collection Time: 03/27/16 10:40 AM  Result Value Ref Range   FIO2 0.35    Delivery systems VENTILATOR    Mode CONTINUOUS POSITIVE AIRWAY PRESSURE    Peep/cpap 5.0 cm H20   Pressure support 5.0 cm H20   pH, Arterial 7.39 7.350 -  7.450   pCO2 arterial 42.1 32.0 - 48.0 mmHg   pO2, Arterial 118 (H) 83.0 - 108.0 mmHg   Bicarbonate 24.8 20.0 - 28.0 mmol/L   Acid-Base Excess 0.5 0.0 - 2.0 mmol/L   O2 Saturation 98.2 %   Patient temperature 37.0    Collection site RIGHT RADIAL    Drawn by [id number]    Sample type ARTERIAL DRAW    Allens test (pass/fail) PASS PASS  Magnesium      Status: Abnormal   Collection Time: 03/28/16  4:04 AM  Result Value Ref Range   Magnesium 1.6 (L) 1.7 - 2.4 mg/dL  Basic metabolic panel     Status: Abnormal   Collection Time: 03/28/16  4:04 AM  Result Value Ref Range   Sodium 149 (H) 135 - 145 mmol/L   Potassium 3.5 3.5 - 5.1 mmol/L   Chloride 116 (H) 101 - 111 mmol/L   CO2 26 22 - 32 mmol/L   Glucose, Bld 87 65 - 99 mg/dL   BUN 48 (H) 6 - 20 mg/dL   Creatinine, Ser 1.69 (H) 0.61 - 1.24 mg/dL   Calcium 9.3 8.9 - 10.3 mg/dL   GFR calc non Af Amer 48 (L) >60 mL/min   GFR calc Af Amer 56 (L) >60 mL/min    Comment: (NOTE) The eGFR has been calculated using the CKD EPI equation. This calculation has not been validated in all clinical situations. eGFR's persistently <60 mL/min signify possible Chronic Kidney Disease.    Anion gap 7 5 - 15  Ammonia     Status: None   Collection Time: 03/28/16  4:04 AM  Result Value Ref Range   Ammonia 27 9 - 35 umol/L    ABGS  Recent Labs  03/27/16 1040  PHART 7.39  PO2ART 118*  HCO3 24.8   CULTURES Recent Results (from the past 240 hour(s))  Blood culture (routine x 2)     Status: None (Preliminary result)   Collection Time: 03/25/16  3:42 AM  Result Value Ref Range Status   Specimen Description BLOOD LEFT HAND  Final   Special Requests BOTTLES DRAWN AEROBIC AND ANAEROBIC 6CC EACH  Final   Culture NO GROWTH 2 DAYS  Final   Report Status PENDING  Incomplete  Blood culture (routine x 2)     Status: None (Preliminary result)   Collection Time: 03/25/16  3:45 AM  Result Value Ref Range Status   Specimen Description BLOOD LEFT FOREARM  Final   Special Requests BOTTLES DRAWN AEROBIC AND ANAEROBIC 4CC EACH  Final   Culture NO GROWTH 2 DAYS  Final   Report Status PENDING  Incomplete   Studies/Results: Dg Chest Port 1 View  Result Date: 03/27/2016 CLINICAL DATA:  Intubation. EXAM: PORTABLE CHEST 1 VIEW COMPARISON:  03/26/2016. FINDINGS: Endotracheal tube and right IJ line in stable  position. NG tube appears to been withdrawn, its tip appears to be in the mid esophagus. Advancement of approximately 16 cm should be considered. Cardiomegaly with mild pulmonary venous congestion. Low lung volumes. No focal alveolar infiltrate. No pleural effusion or pneumothorax . IMPRESSION: 1. NG tube tip appears to been withdrawn, its tip appears to be in the mid esophagus. Advancement of approximately 16 cm should be considered. 2. Cardiomegaly with mild pulmonary venous congestion. Low lung volumes. Critical Value/emergent results were called by telephone at the time of interpretation on 03/27/2016 at 7:10 am to nurse Tyrone Schimke , who verbally acknowledged these results. Electronically Signed   By: Marcello Moores  Register   On: 03/27/2016 07:12   Dg Chest Port 1 View  Result Date: 03/26/2016 CLINICAL DATA:  Respiratory failure EXAM: PORTABLE CHEST 1 VIEW COMPARISON:  Yesterday FINDINGS: Endotracheal tube tip is at the clavicular heads. An orogastric tube reaches the stomach at least. Stable mild cardiomegaly. No consolidation, edema, effusion, or pneumothorax. IMPRESSION: 1. Stable, unremarkable endotracheal and orogastric tube positioning. 2. Cardiomegaly without edema or consolidation. Electronically Signed   By: Monte Fantasia M.D.   On: 03/26/2016 09:07   Dg Chest Port 1v Same Day  Result Date: 03/26/2016 CLINICAL DATA:  Right sided PICC line and IJ line placement. EXAM: PORTABLE CHEST 1 VIEW COMPARISON:  Earlier same day FINDINGS: Endotracheal tube has its tip 5 cm above the carina. Nasogastric tube enters the abdomen. Right IJ PICC has its tip in the SVC 3 cm above the right atrium. Pulmonary venous hypertension persists. No consolidation or collapse. IMPRESSION: Right IJ PICC line tip in the SVC 3 cm above the right atrium. Electronically Signed   By: Nelson Chimes M.D.   On: 03/26/2016 13:42    Medications:  Prior to Admission:  Prescriptions Prior to Admission  Medication Sig Dispense Refill Last  Dose  . Calcium Carb-Cholecalciferol (CALCIUM 600+D) 600-800 MG-UNIT TABS Take 1 tablet by mouth 2 (two) times daily.    81/27/5170  . folic acid (FOLVITE) 1 MG tablet Take 1 tablet (1 mg total) by mouth daily. 30 tablet 1 03/24/2016  . lactulose (CHRONULAC) 10 GM/15ML solution Take 30 mLs (20 g total) by mouth 3 (three) times daily. 240 mL 3 03/24/2016  . magnesium oxide (MAG-OX) 400 (241.3 Mg) MG tablet Take 1 tablet (400 mg total) by mouth 2 (two) times daily.   03/24/2016  . MILK THISTLE PO Take 1 tablet by mouth daily.   03/24/2016  . Misc Natural Products (DANDELION ROOT PO) Take 1 tablet by mouth daily.   03/24/2016  . nadolol (CORGARD) 40 MG tablet Take 1 tablet (40 mg total) by mouth 2 (two) times daily. 60 tablet 2 03/24/2016  . pantoprazole (PROTONIX) 40 MG tablet Take 1 tablet (40 mg total) by mouth 2 (two) times daily before a meal. 30 tablet 11 03/24/2016  . potassium chloride SA (K-DUR,KLOR-CON) 20 MEQ tablet Take 20 mEq by mouth 2 (two) times daily.   03/24/2016  . rifaximin (XIFAXAN) 550 MG TABS tablet Take 1 tablet (550 mg total) by mouth 2 (two) times daily. (Patient not taking: Reported on 02/08/2016) 42 tablet 1 Not Taking  . spironolactone (ALDACTONE) 100 MG tablet Take 0.5 tablets (50 mg total) by mouth daily.   03/24/2016  . thiamine 100 MG tablet Take 1 tablet (100 mg total) by mouth daily. 30 tablet 1 03/24/2016  . torsemide (DEMADEX) 20 MG tablet Take 1 tablet (20 mg total) by mouth daily. For treatment of swelling. 30 tablet 3 03/24/2016   Scheduled: . chlorhexidine gluconate (MEDLINE KIT)  15 mL Mouth Rinse BID  . famotidine (PEPCID) IV  20 mg Intravenous Q24H  . furosemide  40 mg Intravenous Q12H  . lactulose  30 g Per Tube TID  . metoprolol  10 mg Intravenous Q6H  . rifaximin  550 mg Per Tube BID  . sodium chloride flush  3 mL Intravenous Q12H   Continuous: . sodium chloride 75 mL/hr at 03/28/16 0600   PRN:  Assesment:He was admitted with hepatic  encephalopathy and seizures. He is known to have alcoholic cirrhosis of the liver. He has anasarca. His encephalopathy has  cleared. He was intubated and placed on mechanical ventilation for airway protection and he is  better. Principal Problem:   Hepatic encephalopathy (HCC) Active Problems:   Decompensation of cirrhosis of liver (HCC)   Alcoholic cirrhosis of liver with ascites (HCC)   Anasarca   Encephalopathy, hepatic (HCC)   AKI (acute kidney injury) ([place])   Seizures ([place])    Plan: I will plan to sign off. He apparently does not have any lung disease at baseline. I would be happy to see him again if needed. Thanks for allowing me to participate in his care.    LOS: 3 days   , L 03/28/2016, 8:04 AM

## 2016-03-29 DIAGNOSIS — N179 Acute kidney failure, unspecified: Secondary | ICD-10-CM

## 2016-03-29 LAB — COMPREHENSIVE METABOLIC PANEL
ALK PHOS: 106 U/L (ref 38–126)
ALT: 37 U/L (ref 17–63)
AST: 116 U/L — AB (ref 15–41)
Albumin: 2.5 g/dL — ABNORMAL LOW (ref 3.5–5.0)
Anion gap: 8 (ref 5–15)
BILIRUBIN TOTAL: 5.4 mg/dL — AB (ref 0.3–1.2)
BUN: 38 mg/dL — AB (ref 6–20)
CALCIUM: 8.9 mg/dL (ref 8.9–10.3)
CO2: 25 mmol/L (ref 22–32)
CREATININE: 1.82 mg/dL — AB (ref 0.61–1.24)
Chloride: 106 mmol/L (ref 101–111)
GFR calc Af Amer: 51 mL/min — ABNORMAL LOW (ref >60)
GFR, EST NON AFRICAN AMERICAN: 44 mL/min — AB (ref >60)
Glucose, Bld: 105 mg/dL — ABNORMAL HIGH (ref 65–99)
Potassium: 3.2 mmol/L — ABNORMAL LOW (ref 3.5–5.1)
Sodium: 139 mmol/L (ref 135–145)
TOTAL PROTEIN: 8 g/dL (ref 6.5–8.1)

## 2016-03-29 LAB — CBC
HCT: 29.2 % — ABNORMAL LOW (ref 39.0–52.0)
Hemoglobin: 8.9 g/dL — ABNORMAL LOW (ref 13.0–17.0)
MCH: 29.4 pg (ref 26.0–34.0)
MCHC: 30.5 g/dL (ref 30.0–36.0)
MCV: 96.4 fL (ref 78.0–100.0)
PLATELETS: 163 10*3/uL (ref 150–400)
RBC: 3.03 MIL/uL — ABNORMAL LOW (ref 4.22–5.81)
RDW: 19.6 % — AB (ref 11.5–15.5)
WBC: 10.7 10*3/uL — AB (ref 4.0–10.5)

## 2016-03-29 LAB — AMMONIA: AMMONIA: 24 umol/L (ref 9–35)

## 2016-03-29 MED ORDER — THIAMINE HCL 100 MG PO TABS: 100.0000 mg | ORAL_TABLET | Freq: Every day | ORAL | 11 refills | Status: DC

## 2016-03-29 MED ORDER — POTASSIUM CHLORIDE CRYS ER 20 MEQ PO TBCR
40.0000 meq | EXTENDED_RELEASE_TABLET | Freq: Once | ORAL | Status: AC
  Administered 2016-03-29: 40 meq via ORAL
  Filled 2016-03-29: qty 2

## 2016-03-29 MED ORDER — FOLIC ACID 1 MG PO TABS: 1.0000 mg | ORAL_TABLET | Freq: Every day | ORAL | 11 refills | Status: DC

## 2016-03-29 NOTE — Discharge Summary (Signed)
Physician Discharge Summary  Antonio [name] [id number] DOB: 08/27/1970 DOA: 03/25/2016  PCP: Deloria Lair, MD  Admit date: 03/25/2016 Discharge date: 03/29/2016  Time spent: 45 minutes  Recommendations for Outpatient Follow-up:  -Will be discharged home today. -Advised to follow up with PCP in 2 weeks.   Discharge Diagnoses:  Principal Problem:   Hepatic encephalopathy ([place]) Active Problems:   Decompensation of cirrhosis of liver (HCC)   Alcoholic cirrhosis of liver with ascites (HCC)   Anasarca   Encephalopathy, hepatic (HCC)   AKI (acute kidney injury) ([place])   Seizures ([place])   Discharge Condition: Stable and improved  Filed Weights   03/25/16 0815 03/26/16 0500 03/27/16 0500  Weight: (!) 144.3 kg (318 lb 2 oz) (!) 139.3 kg (307 lb 1.6 oz) 135.5 kg (298 lb 11.6 oz)    History of present illness:  As per Dr. Sarajane Jews on 10/29: 45 year old man with end-stage liver disease, just hospitalized for 2 weeks with discharge 10/27 for decompensated end-stage liver disease, anasarca requiring prolonged diuresis and anemia secondary to GI bleed. EGD and colonoscopy without acute findings at that time. In the emergency department patient had nonfocal neurologic exam but could not protect airway and therefore was intubated for airway protection. Abnormalities included hypothermia, ammonia 307. Admitted for acute hepatic encephalopathy.  History per wife at bedside: He is doing well when he went home 10/27. He went to stay at his mother's house. He did not take lactulose that evening. 10/28 he did not take the evening lactulose. It is not clear whether he took lactulose during this stay. He did eat some breakfast 10/28. His wife went over them at home for lunch, he did not eat very much at that time. He reported constipation with no bowel movement 10/27 and only a small 10/28. He seemed to be tired and did sleep most of the day. He woke up for dinner which he had with his wife and  seemed to be doing well at that time with the exception of being fatigued.  Approximate 10 PM October 28 he was noted to be confused, thought his wife was his mother. He laid down to sleep and then woke back up. While he was sitting in the chair she noticed seizure-like activity with some gentle shaking of his arms only. He was incontinent of urine. Seizure-like activity lasted only a short period of time.   Hospital Course:   1. Acute hepatic encephalopathy with marked hyperammonemia, with associated seizure as an outpatient. Ammonia has returned normal. Mental status seems back to baseline (wife agrees). Wife believes he missed at least 2 doses of lactulose, which may have precipitated this hospitalization and his state on admission. 2. Reported seizure prior to admission. By wife's description this may have been seizure-like activity rather than true seizure. He's had no seizure activity during this hospitalization. Suspect this is secondary to marked encephalopathy rather than new seizure onset. No antiepileptics unless has further seizures. 3. Acute kidney injury superimposed on CKD stage III, actually improved with diuresis.  4. 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 MELD 29 = 19.6% 75-month mortality. Patient and family nor interested in palliation. Apparently patient is on the list for liver transplantation. 5. Acute blood loss anemia status post GI bleed approximately 2 weeks ago. Underwent colonoscopy and EGD October 12 with removal of one polyp from descending colon and one from cecum. Rectal varices noted. EGD showed grade 2 esophageal varices,  portal hypertensive gastropathy. Has some bleeding from the mouth on the day of admission. No GI bleeding or bleeding from airway. Seems to have resolved at this point. 6. Atrial fibrillation/flutter. Diagnosed last admission.  Echocardiogram on file August 2017. No significant valvular abnormalities. Normal LVEF EF at that time. TSH WNL. Not a candidate for anticoagulation at this point given recent GI bleed. Continue metoprolol for rate control. 7. Morbid obesity. 8. Demand ischemia secondary to acute illness. No further evaluation suggested.  Procedures:  None   Consultations:  Pulmonary  Discharge Instructions  Discharge Instructions    Diet - low sodium heart healthy    Complete by:  As directed    Increase activity slowly    Complete by:  As directed        Medication List    STOP taking these medications   DANDELION ROOT PO   MILK THISTLE PO     TAKE these medications   CALCIUM 600+D 600-800 MG-UNIT Tabs Generic drug:  Calcium Carb-Cholecalciferol Take 1 tablet by mouth 2 (two) times daily.   folic acid 1 MG tablet Commonly known as:  FOLVITE Take 1 tablet (1 mg total) by mouth daily.   lactulose 10 GM/15ML solution Commonly known as:  CHRONULAC Take 30 mLs (20 g total) by mouth 3 (three) times daily.   magnesium oxide 400 (241.3 Mg) MG tablet Commonly known as:  MAG-OX Take 1 tablet (400 mg total) by mouth 2 (two) times daily.   nadolol 40 MG tablet Commonly known as:  CORGARD Take 1 tablet (40 mg total) by mouth 2 (two) times daily.   pantoprazole 40 MG tablet Commonly known as:  PROTONIX Take 1 tablet (40 mg total) by mouth 2 (two) times daily before a meal.   potassium chloride SA 20 MEQ tablet Commonly known as:  K-DUR,KLOR-CON Take 20 mEq by mouth 2 (two) times daily.   rifaximin 550 MG Tabs tablet Commonly known as:  XIFAXAN Take 1 tablet (550 mg total) by mouth 2 (two) times daily.   spironolactone 100 MG tablet Commonly known as:  ALDACTONE Take 0.5 tablets (50 mg total) by mouth daily.   thiamine 100 MG tablet Take 1 tablet (100 mg total) by mouth daily.   torsemide 20 MG tablet Commonly known as:  DEMADEX Take 1 tablet (20 mg total) by mouth daily.  For treatment of swelling.      No Known Allergies    The results of significant diagnostics from this hospitalization (including imaging, microbiology, ancillary and laboratory) are listed below for reference.    Significant Diagnostic Studies: Dg Chest 2 View  Result Date: 03/06/2016 CLINICAL DATA:  Dyspnea. EXAM: CHEST  2 VIEW COMPARISON:  01/09/2016 chest radiograph. FINDINGS: Stable cardiomediastinal silhouette with mild cardiomegaly. No pneumothorax. No pleural effusion. Mild pulmonary edema. No acute consolidative airspace disease. IMPRESSION: Mild congestive heart failure. Electronically Signed   By: Ilona Sorrel M.D.   On: 03/06/2016 13:26   Ct Head Wo Contrast  Result Date: 03/25/2016 CLINICAL DATA:  45 year old male with altered mental status and unresponsiveness. EXAM: CT HEAD WITHOUT CONTRAST CT CERVICAL SPINE WITHOUT CONTRAST TECHNIQUE: Multidetector CT imaging of the head and cervical spine was performed following the standard protocol without intravenous contrast. Multiplanar CT image reconstructions of the cervical spine were also generated. COMPARISON:  None. FINDINGS: CT HEAD FINDINGS Brain: No evidence of acute infarction, hemorrhage, hydrocephalus, extra-axial collection or mass lesion/mass effect. Vascular: No hyperdense vessel or unexpected calcification. Skull: Normal. Negative for fracture or  focal lesion. Sinuses/Orbits: No acute finding. Other:  Partially visualized endotracheal and enteric tubes. CT CERVICAL SPINE FINDINGS Alignment: No acute subluxation. There is reversal of normal cervical lordosis. Skull base and vertebrae: No acute fracture. No primary bone lesion or focal pathologic process. Soft tissues and spinal canal: No prevertebral fluid or swelling. No visible canal hematoma. Disc levels: There is disc disease and endplate irregularity and bone spurring at C5-C6. Upper chest: Left posterior apical subpleural density, likely atelectasis/ scarring. This is  incompletely evaluated. Other: An endotracheal tube and enteric tube are partially visualized. The distal tips of the tubes are not included in the images. IMPRESSION: No acute intracranial pathology. No acute/ traumatic cervical spine pathology. Electronically Signed   By: Anner Crete M.D.   On: 03/25/2016 06:17   Ct Cervical Spine Wo Contrast  Result Date: 03/25/2016 CLINICAL DATA:  45 year old male with altered mental status and unresponsiveness. EXAM: CT HEAD WITHOUT CONTRAST CT CERVICAL SPINE WITHOUT CONTRAST TECHNIQUE: Multidetector CT imaging of the head and cervical spine was performed following the standard protocol without intravenous contrast. Multiplanar CT image reconstructions of the cervical spine were also generated. COMPARISON:  None. FINDINGS: CT HEAD FINDINGS Brain: No evidence of acute infarction, hemorrhage, hydrocephalus, extra-axial collection or mass lesion/mass effect. Vascular: No hyperdense vessel or unexpected calcification. Skull: Normal. Negative for fracture or focal lesion. Sinuses/Orbits: No acute finding. Other:  Partially visualized endotracheal and enteric tubes. CT CERVICAL SPINE FINDINGS Alignment: No acute subluxation. There is reversal of normal cervical lordosis. Skull base and vertebrae: No acute fracture. No primary bone lesion or focal pathologic process. Soft tissues and spinal canal: No prevertebral fluid or swelling. No visible canal hematoma. Disc levels: There is disc disease and endplate irregularity and bone spurring at C5-C6. Upper chest: Left posterior apical subpleural density, likely atelectasis/ scarring. This is incompletely evaluated. Other: An endotracheal tube and enteric tube are partially visualized. The distal tips of the tubes are not included in the images. IMPRESSION: No acute intracranial pathology. No acute/ traumatic cervical spine pathology. Electronically Signed   By: Anner Crete M.D.   On: 03/25/2016 06:17   US Renal  Result  Date: 03/07/2016 CLINICAL DATA:  Recently diagnosed with acute tubular necrosis. History of CHF, alcoholic hepatitis and cirrhosis. EXAM: RENAL / URINARY TRACT ULTRASOUND COMPLETE COMPARISON:  None in PACs FINDINGS: Right Kidney: Length: 13.3 cm. The renal cortical echotexture is approximately equal to that of the adjacent liver. There is no cortical atrophy. There is no hydronephrosis. No stones are evident. Left Kidney: Length: 12.8 cm. The renal cortical echotexture is similar to that on the right. There is no hydronephrosis. No cortical atrophy. No evidence of stones. Bladder: The urinary bladder is decompressed with a Foley catheter. IMPRESSION: Slightly increased renal cortical echotexture as compared to the liver though this may be spurring this given the patient's known cirrhosis. No evidence of cortical atrophy or obstruction. Nondistended urinary bladder with Foley catheter present. Electronically Signed   By: David  Martinique M.D.   On: 03/07/2016 11:36   Dg Chest Port 1 View  Result Date: 03/27/2016 CLINICAL DATA:  Intubation. EXAM: PORTABLE CHEST 1 VIEW COMPARISON:  03/26/2016. FINDINGS: Endotracheal tube and right IJ line in stable position. NG tube appears to been withdrawn, its tip appears to be in the mid esophagus. Advancement of approximately 16 cm should be considered. Cardiomegaly with mild pulmonary venous congestion. Low lung volumes. No focal alveolar infiltrate. No pleural effusion or pneumothorax . IMPRESSION: 1. NG tube tip appears  to been withdrawn, its tip appears to be in the mid esophagus. Advancement of approximately 16 cm should be considered. 2. Cardiomegaly with mild pulmonary venous congestion. Low lung volumes. Critical Value/emergent results were called by telephone at the time of interpretation on 03/27/2016 at 7:10 am to nurse Tyrone Schimke , who verbally acknowledged these results. Electronically Signed   By: Marcello Moores  Register   On: 03/27/2016 07:12   Dg Chest Port 1  View  Result Date: 03/26/2016 CLINICAL DATA:  Respiratory failure EXAM: PORTABLE CHEST 1 VIEW COMPARISON:  Yesterday FINDINGS: Endotracheal tube tip is at the clavicular heads. An orogastric tube reaches the stomach at least. Stable mild cardiomegaly. No consolidation, edema, effusion, or pneumothorax. IMPRESSION: 1. Stable, unremarkable endotracheal and orogastric tube positioning. 2. Cardiomegaly without edema or consolidation. Electronically Signed   By: Monte Fantasia M.D.   On: 03/26/2016 09:07   Dg Chest Port 1 View  Result Date: 03/25/2016 CLINICAL DATA:  45 year old male with altered mental status and unresponsiveness. Status post intubation. EXAM: PORTABLE CHEST 1 VIEW COMPARISON:  Chest radiograph dated 03/11/2016 FINDINGS: Endotracheal tube with tip approximately 4.5 cm above the carina. An enteric tube extends into the left upper abdomen with tip and side-port likely in the proximal stomach. There is stable moderate cardiomegaly with mild vascular congestion. No focal consolidation, pleural effusion, or pneumothorax. Old-appearing fracture of the lateral aspect of the right clavicle at the [facility] joint. Clinical correlation is recommended. No definite acute osseous pathology. IMPRESSION: Endotracheal tube above the carina and enteric tube in the proximal stomach. Moderate cardiomegaly with mild vascular congestion. No focal consolidation. Electronically Signed   By: Anner Crete M.D.   On: 03/25/2016 05:39   Dg Chest Port 1 View  Result Date: 03/11/2016 CLINICAL DATA:  PICC line placement. EXAM: PORTABLE CHEST 1 VIEW COMPARISON:  March 09, 2006 FINDINGS: Stable cardiomegaly. The hila and mediastinum are unremarkable. A new right PICC line has been placed. It is difficult to see the distal tip but I believe it terminates in the central SVC. No focal infiltrate or overt edema. IMPRESSION: 1. New right PICC line. The distal tip is difficult to visualize but appears to terminate centrally,  likely within the SVC. Electronically Signed   By: Dorise Bullion III M.D   On: 03/11/2016 15:30   Dg Chest Port 1 View  Result Date: 03/09/2016 CLINICAL DATA:  Hypoxia and rectal bleeding EXAM: PORTABLE CHEST 1 VIEW COMPARISON:  Chest radiograph 03/06/2016 FINDINGS: Cardiac silhouette remains enlarged. There is slightly improved aeration of the lung bases with associated slight decrease in pulmonary edema. No pneumothorax or sizable pleural effusion. No focal airspace consolidation. IMPRESSION: Slightly improved mild pulmonary edema. Electronically Signed   By: Ulyses Jarred M.D.   On: 03/09/2016 13:52   Dg Chest Port 1v Same Day  Result Date: 03/26/2016 CLINICAL DATA:  Right sided PICC line and IJ line placement. EXAM: PORTABLE CHEST 1 VIEW COMPARISON:  Earlier same day FINDINGS: Endotracheal tube has its tip 5 cm above the carina. Nasogastric tube enters the abdomen. Right IJ PICC has its tip in the SVC 3 cm above the right atrium. Pulmonary venous hypertension persists. No consolidation or collapse. IMPRESSION: Right IJ PICC line tip in the SVC 3 cm above the right atrium. Electronically Signed   By: Nelson Chimes M.D.   On: 03/26/2016 13:42    Microbiology: Recent Results (from the past 240 hour(s))  Blood culture (routine x 2)     Status: None (Preliminary result)  Collection Time: 03/25/16  3:42 AM  Result Value Ref Range Status   Specimen Description BLOOD LEFT HAND  Final   Special Requests BOTTLES DRAWN AEROBIC AND ANAEROBIC 6CC EACH  Final   Culture NO GROWTH 4 DAYS  Final   Report Status PENDING  Incomplete  Blood culture (routine x 2)     Status: None (Preliminary result)   Collection Time: 03/25/16  3:45 AM  Result Value Ref Range Status   Specimen Description BLOOD LEFT FOREARM  Final   Special Requests BOTTLES DRAWN AEROBIC AND ANAEROBIC 4CC EACH  Final   Culture NO GROWTH 4 DAYS  Final   Report Status PENDING  Incomplete     Labs: Basic Metabolic Panel:  Recent  Labs Lab 03/25/16 0343 03/26/16 0931 03/27/16 0406 03/28/16 0404 03/29/16 0656  NA 136 145 147* 149* 139  K 4.2 2.6* 3.0* 3.5 3.2*  CL 99* 110 114* 116* 106  CO2 26 24 24 26 25   GLUCOSE 109* 92 94 87 105*  BUN 67* 68* 61* 48* 38*  CREATININE 2.55* 2.41* 2.05* 1.69* 1.82*  CALCIUM 8.9 9.0 8.9 9.3 8.9  MG 1.7  --  1.6* 1.6*  --    Liver Function Tests:  Recent Labs Lab 03/25/16 0343 03/26/16 0931 03/29/16 0656  AST 94* 82* 116*  ALT 30 27 37  ALKPHOS 157* 115 106  BILITOT 4.3* 6.8* 5.4*  PROT 8.0 7.2 8.0  ALBUMIN 2.7* 2.3* 2.5*   No results for input(s): LIPASE, AMYLASE in the last 168 hours.  Recent Labs Lab 03/25/16 0343 03/26/16 0931 03/27/16 0406 03/28/16 0404 03/29/16 0656  AMMONIA 307* 53* 28 27 24    CBC:  Recent Labs Lab 03/25/16 0343 03/25/16 2025 03/26/16 0242 03/26/16 0931 03/27/16 0406 03/29/16 0656  WBC 7.5  --   --  10.8* 10.3 10.7*  NEUTROABS 3.6  --   --   --   --   --   HGB 9.2* 8.3* 8.5* 8.1* 7.9* 8.9*  HCT 28.4* 25.5* 26.4* 25.3* 25.4* 29.2*  MCV 90.2  --   --  91.7 93.7 96.4  PLT 172  --   --  152 137* 163   Cardiac Enzymes:  Recent Labs Lab 03/25/16 0343 03/25/16 0920  TROPONINI 0.04* 0.04*   BNP: BNP (last 3 results)  Recent Labs  01/03/16 2200 03/06/16 1300 03/25/16 0343  BNP 274.0* 545.0* 176.0*    ProBNP (last 3 results) No results for input(s): PROBNP in the last 8760 hours.  CBG:  Recent Labs Lab 03/25/16 1142  GLUCAP 90       Signed:  HERNANDEZ ACOSTA,ESTELA  [facility] Pager: [phone] 03/29/2016, 3:37 PM

## 2016-03-30 LAB — CULTURE, BLOOD (ROUTINE X 2)
CULTURE: NO GROWTH
Culture: NO GROWTH

## 2016-04-05 ENCOUNTER — Telehealth: Payer: Self-pay | Admitting: Gastroenterology

## 2016-04-05 NOTE — Telephone Encounter (Signed)
Called the other number listed under the pt's name and VM said Gwen, and I left message for pt to go to the ED if he is confused and to please call and let us know they got the message.

## 2016-04-05 NOTE — Telephone Encounter (Signed)
Called pt's mother at the number listed and left Vm that if he is confused he should go to the ED now and to please let us know that she got this message.

## 2016-04-05 NOTE — Telephone Encounter (Signed)
PLEASE CALL PT'S WIFE. IF HE IS CONFUSED HE SHOULD GO TO THE ED.

## 2016-04-05 NOTE — Telephone Encounter (Signed)
PT's mom called and said the pt is much better. She thinks he had just not been taking his meds correctly. But said he is ok now.

## 2016-04-05 NOTE — Telephone Encounter (Signed)
Pt's wife called to say that patient's mother had called her to say that patient is becoming confused and they are worried that something is wrong and didn't know what to do. They don't want for it to get any worse and he end up in the hospital. Please advise and called [phone]

## 2016-04-06 NOTE — Telephone Encounter (Signed)
REVIEWED-NO ADDITIONAL RECOMMENDATIONS. 

## 2016-04-11 ENCOUNTER — Encounter: Payer: Self-pay | Admitting: Gastroenterology

## 2016-04-11 ENCOUNTER — Ambulatory Visit (INDEPENDENT_AMBULATORY_CARE_PROVIDER_SITE_OTHER): Payer: BLUE CROSS/BLUE SHIELD | Admitting: Gastroenterology

## 2016-04-11 DIAGNOSIS — K7031 Alcoholic cirrhosis of liver with ascites: Secondary | ICD-10-CM | POA: Diagnosis not present

## 2016-04-11 DIAGNOSIS — K7290 Hepatic failure, unspecified without coma: Secondary | ICD-10-CM | POA: Diagnosis not present

## 2016-04-11 DIAGNOSIS — K625 Hemorrhage of anus and rectum: Secondary | ICD-10-CM | POA: Diagnosis not present

## 2016-04-11 DIAGNOSIS — K7682 Hepatic encephalopathy: Secondary | ICD-10-CM

## 2016-04-11 MED ORDER — NADOLOL 40 MG PO TABS: ORAL_TABLET | ORAL | 11 refills | Status: DC

## 2016-04-11 MED ORDER — LACTULOSE 10 GM/15ML PO SOLN: 20.0000 g | Freq: Three times a day (TID) | ORAL | 11 refills | Status: DC

## 2016-04-11 MED ORDER — TORSEMIDE 20 MG PO TABS: ORAL_TABLET | ORAL | 11 refills | Status: DC

## 2016-04-11 MED ORDER — SPIRONOLACTONE 100 MG PO TABS: 50.0000 mg | ORAL_TABLET | Freq: Every day | ORAL | 11 refills | Status: DC

## 2016-04-11 NOTE — Progress Notes (Signed)
Subjective:    Patient ID: Antonio [name], male    DOB: [birth year], 45 y.o.   MRN: [id number]  Deloria Lair, MD   HPI Saw [facility]. GETTING LACTULOSE-TID. BMs: #3 OR #4. WHEN IT GETS RUNNY MAY SEE BLOOD. STILL HAVEN'T HEARD ANYTHING BACK ABOUT GETTING XIFAXAN. GETS MEDICAID CARD IN DEC 2017. SWELLING INE LEGS AND ARMS GONE.   PT DENIES FEVER, CHILLS, HEMATEMESIS, nausea, vomiting, melena, diarrhea, CHEST PAIN, SHORTNESS OF BREATH, CHANGE IN BOWEL IN HABITS, constipation, abdominal pain, problems swallowing, OR heartburn or indigestion.  Past Medical History:  Diagnosis Date  . Alcohol abuse   . Alcoholic hepatitis   . Cirrhosis with alcoholism [facility])     Past Surgical History:  Procedure Laterality Date  . BIOPSY  03/08/2016   Procedure: BIOPSY;  Surgeon: Daneil Dolin, MD;  Location: [facility];  Service: Endoscopy;;  descending colon biopsies  . Colonoscopy     per patient around 2014 in [place]   . COLONOSCOPY WITH PROPOFOL [name] 03/08/2016   Procedure: COLONOSCOPY WITH PROPOFOL;  Surgeon: Daneil Dolin, MD;  Location: [facility];  Service: Endoscopy;  Laterality: [name];  . ESOPHAGOGASTRODUODENOSCOPY (EGD) WITH PROPOFOL [name] 12/28/2015   Dr. Oneida Alar: 4 columns of esophageal varices, 3 grade 1, 1 grade 2 with no evidence of bleeding. Mild portal gastropathy with no evidence of active bleeding.  . ESOPHAGOGASTRODUODENOSCOPY (EGD) WITH PROPOFOL [name] 03/08/2016   Procedure: ESOPHAGOGASTRODUODENOSCOPY (EGD) WITH PROPOFOL;  Surgeon: Daneil Dolin, MD;  Location: [facility];  Service: Endoscopy;  Laterality: [name];  . POLYPECTOMY  03/08/2016   Procedure: POLYPECTOMY;  Surgeon: Daneil Dolin, MD;  Location: [facility];  Service: Endoscopy;;  cecum and descending    No Known Allergies  Current Outpatient Prescriptions  Medication Sig Dispense Refill  . Calcium Carb-Cholecalciferol (CALCIUM 600+D) 600-800 MG-UNIT TABS Take 1 tablet by mouth 2 (two) times daily.     . folic acid  (FOLVITE) 1 MG tablet Take 1 tablet (1 mg total) by mouth daily.    Marland Kitchen lactulose (CHRONULAC) 10 GM/15ML solution Take 30 mLs (20 g total) by mouth 3 (three) times daily.    . magnesium oxide (MAG-OX) 400 (241.3 Mg) MG tablet Take 1 tablet (400 mg total) by mouth 2 (two) times daily.    . nadolol (CORGARD) 40 MG tablet Take 1 tablet (40 mg total) by mouth 2 (two) times daily.    . pantoprazole (PROTONIX) 40 MG tablet Take 1 tablet (40 mg total) by mouth 2 (two) times daily before a meal.    . potassium chloride SA (K-DUR,KLOR-CON) 20 MEQ tablet Take 20 mEq by mouth 2 (two) times daily.    .      . spironolactone (ALDACTONE) 100 MG tablet Take 0.5 tablets (50 mg total) by mouth daily.    Marland Kitchen thiamine 100 MG tablet Take 1 tablet (100 mg total) by mouth daily.    Marland Kitchen torsemide (DEMADEX) 20 MG tablet Take 1 tablet (20 mg total) by mouth daily. For treatment of swelling.     Review of Systems PER HPI OTHERWISE ALL SYSTEMS ARE NEGATIVE.    Objective:   Physical Exam  Constitutional: He is oriented to person, place, and time. He appears well-developed and well-nourished. No distress.  HENT:  Head: Normocephalic and atraumatic.  Mouth/Throat: Oropharynx is clear and moist. No oropharyngeal exudate.  Eyes: Pupils are equal, round, and reactive to light. Scleral icterus is present.  Neck: Normal range of motion. Neck supple.  Cardiovascular: Normal rate, regular rhythm and normal heart sounds.   Pulmonary/Chest: Effort normal and breath sounds normal. No respiratory distress.  Abdominal: Soft. Bowel sounds are normal. He exhibits no distension. There is no tenderness.  Musculoskeletal: He exhibits no edema.  Lymphadenopathy:    He has no cervical adenopathy.  Neurological: He is alert and oriented to person, place, and time.  NO  NEW FOCAL DEFICITS  Psychiatric: He has a normal mood and affect. His behavior is normal. Judgment and thought content normal.  answers question appropriately  Vitals  reviewed.     Assessment & Plan:

## 2016-04-11 NOTE — Assessment & Plan Note (Signed)
SYMPTOMS FAIRLY WELL CONTROLLED. ONLY HAS TROUBLE IF HE HAS A LOT OF BMs. LAST HB STABLE AT 10.  CONTINUE TO MONITOR SYMPTOMS. RECHECK CBC NEXT WEEK.

## 2016-04-11 NOTE — Addendum Note (Signed)
Addended by: Danie Binder on: 04/11/2016 04:03 PM   Modules accepted: Orders

## 2016-04-11 NOTE — Assessment & Plan Note (Signed)
NO ETOH. AWAITING ENROLLMENT IN CALLS FOR ETOH PREVENTION. WANTS TO KNOW IF HE CAN HAVE INTERCOURSE  EXPLAINED [place] STYLE AND NO GADGETS. EXPLAINED TO PT HE WILL HAVE REDUCED EXERCISE TOLERANCE.  CONTINUE WORK OF THE TREADMILL.

## 2016-04-11 NOTE — Patient Instructions (Addendum)
CUT DOWN NADOLOL TO 20 MG DAILY.  FLUID PILLS- CONTINUE SPIRONOLACTONE(ALDACTONE) AT 50 MG DAILY. CUT DEMADEX(TORSEMIDE) TO 10 MG IN THE MORNING.  HOLD POTASSIUM AND MAGNESIUM PILLS. YOU WILL NOT NEED THESE IF WE CUT THE DOSE OF YOUR TORSEMIDE.   TO PREVENT YOUR AMMONIA FROM GOING UP- ADD XIFAXAN ONCE OR TWICE DAILY. USE LACTULOSE TO HAVE 3-4 BMS A DAY. LET ME KNOW WHEN YOU GET YOUR MEDICAID CARD.   REDUCE PROTONIX TO ONCE DAILY. TAKE 30 MINUTES PRIOR TO BREAKFAST.  PLEASE CALL WITH QUESTIONS OR CONCERNS.  HAVE YOUR BLOOD DRAWN TUES NOV 21. I WILL CHECK YOUR BLOOD COUNT, KIDNEY FUNCTION AND POTASSIUM.  FOLLOW UP IN 3 MOS.

## 2016-04-11 NOTE — Assessment & Plan Note (Signed)
MELD SCORE 30-CHILD [facility]GH C BASED ON [facility] Apr 02 2016. CLINICALLY STABLE. WEIGHT DOWN TO 275 LBS AND NO PERIPHERAL EDEMA.  CUT DOWN NADOLOL TO 20 MG DAILY.  FLUID PILLS- CONTINUE SPIRONOLACTONE(ALDACTONE) AT 50 MG DAILY. CUT DEMADEX(TORSEMIDE) TO 10 MG IN THE MORNING.  HOLD POTASSIUM AND MAGNESIUM PILLS. YOU WILL NOT NEED THESE IF WE CUT THE DOSE OF YOUR TORSEMIDE.  TO PREVENT YOUR AMMONIA FROM GOING UP- ADD XIFAXAN ONCE OR TWICE DAILY. USE LACTULOSE TO HAVE 3-4 BMS A DAY. LET ME KNOW WHEN YOU GET YOUR MEDICAID CARD.  PLEASE CALL WITH QUESTIONS OR CONCERNS.  HAVE YOUR BLOOD DRAWN TUES NOV 21. I WILL CHECK YOUR KIDNEY FUNCTION AND POTASSIUM.  FOLLOW UP IN 3 MOS.

## 2016-04-11 NOTE — Assessment & Plan Note (Signed)
SYMPTOMS FAIRLY WELL CONTROLLED.  CAN'T AFFORD XIFAXAN, BUT GETTING MEDICAID DEC 1. CALL WHEN HE GETS HIS CARD. SAMPLES #18 GIVEN TODAY.

## 2016-04-12 NOTE — Progress Notes (Signed)
On recall  °

## 2016-04-12 NOTE — Progress Notes (Signed)
cc'ed to pcp °

## 2016-04-16 ENCOUNTER — Telehealth: Payer: Self-pay | Admitting: Gastroenterology

## 2016-04-16 NOTE — Telephone Encounter (Signed)
[phone]   Patient wife called and stated that he now has medicaid and he will need a prior auth for his xifaxin from the pharmacy

## 2016-04-17 LAB — CBC WITH DIFFERENTIAL/PLATELET
BASOS PCT: 1 %
Basophils Absolute: 107 cells/uL (ref 0–200)
EOS ABS: 642 {cells}/uL — AB (ref 15–500)
Eosinophils Relative: 6 %
HEMATOCRIT: 32 % — AB (ref 38.5–50.0)
Hemoglobin: 10.1 g/dL — ABNORMAL LOW (ref 13.2–17.1)
LYMPHS PCT: 22 %
Lymphs Abs: 2354 cells/uL (ref 850–3900)
MCH: 28.5 pg (ref 27.0–33.0)
MCHC: 31.6 g/dL — AB (ref 32.0–36.0)
MCV: 90.4 fL (ref 80.0–100.0)
MONOS PCT: 11 %
MPV: 10.2 fL (ref 7.5–12.5)
Monocytes Absolute: 1177 cells/uL — ABNORMAL HIGH (ref 200–950)
NEUTROS PCT: 60 %
Neutro Abs: 6420 cells/uL (ref 1500–7800)
PLATELETS: 266 10*3/uL (ref 140–400)
RBC: 3.54 MIL/uL — ABNORMAL LOW (ref 4.20–5.80)
RDW: 19.4 % — AB (ref 11.0–15.0)
WBC: 10.7 10*3/uL (ref 3.8–10.8)

## 2016-04-17 LAB — MAGNESIUM: Magnesium: 1.8 mg/dL (ref 1.5–2.5)

## 2016-04-17 LAB — BASIC METABOLIC PANEL
BUN: 38 mg/dL — AB (ref 7–25)
CALCIUM: 9.1 mg/dL (ref 8.6–10.3)
CO2: 27 mmol/L (ref 20–31)
CREATININE: 1.72 mg/dL — AB (ref 0.60–1.35)
Chloride: 94 mmol/L — ABNORMAL LOW (ref 98–110)
Glucose, Bld: 95 mg/dL (ref 65–99)
Potassium: 4.5 mmol/L (ref 3.5–5.3)
Sodium: 128 mmol/L — ABNORMAL LOW (ref 135–146)

## 2016-04-17 NOTE — Telephone Encounter (Signed)
Tried to do a PA for xifaxan with [place] medicaid. It keeps telling me that the pt does not have coverage. May have to wait until December.

## 2016-04-18 ENCOUNTER — Other Ambulatory Visit: Payer: Self-pay

## 2016-04-18 DIAGNOSIS — R790 Abnormal level of blood mineral: Secondary | ICD-10-CM

## 2016-04-18 NOTE — Telephone Encounter (Signed)
I'm sure nephrology had made recommendations earlier, but Dr. Oneida Alar' recommendations are most recent and those labs are from yesterday, so we are going with those labs. The reason torsemide is on hold is due to electrolyte concern, which can cause issues in itself. It's unfortunately a tightrope walk at times with adjusting diuretics, and we have to titrate and adjust as seen fit. I'm not sure when Dr. Lowanda Foster last told to take torsemide, and it may have been during hospitalization. What we can do is send these labs over to nephrology with Dr. Oneida Alar' most recent recommendations so that everyone is on the same page. Has he seen nephrology recently?

## 2016-04-18 NOTE — Telephone Encounter (Signed)
cc'ed labs and notes

## 2016-04-18 NOTE — Telephone Encounter (Signed)
I went over the recommendations for the change in fluid meds ( see results dated 04/18/2016 around 6:07 am). The result was not routed to me, but I saw it when I did the order for labs next week. I informed the pt to STOP TORSEMIDE and to cut aldactone in half and take once daily.   His wife, Donovan Kail, called back ( [phone]) and wanted to know what I told pt.  She said he cannot remember well. I went over the instructions with her and she said Dr. Orvilla Cornwall had told pt to take the torsemide. She said he has had so much trouble with the fluid she is afraid he will have a great build up. I told her Dr. Oneida Alar was concerned about his electrolytes. She said Dr. Tawni Carnes did labs also, and she is concerned about 2 doctors doing the fluid pills.  I told her that Dr. Nona Dell is out of the office a few days.  I will let one of the extenders know her concerns.  Vicente Males, please advise!

## 2016-04-18 NOTE — Telephone Encounter (Signed)
PT NEEDS BMP/Mg NOV 29.

## 2016-04-18 NOTE — Telephone Encounter (Signed)
PT is aware to go to the lab on 04/25/2016.

## 2016-04-18 NOTE — Telephone Encounter (Signed)
Pt's wife is aware of Anna's note. She said the pt saw Dr. Tawni Carnes yesterday ( Tues) and had had labs done for him on Monday.  She is aware we are faxing the information to Dr. Tawni Carnes ( the labs and Dr. Artis Flock recommendations).  She will check with him and see if he receives it.   Manuela Schwartz, please fax labs and this note to Dr. Tawni Carnes.

## 2016-04-26 LAB — BASIC METABOLIC PANEL
BUN: 13 mg/dL (ref 7–25)
CO2: 16 mmol/L — ABNORMAL LOW (ref 20–31)
CREATININE: 1.01 mg/dL (ref 0.60–1.35)
Calcium: 8.9 mg/dL (ref 8.6–10.3)
Chloride: 106 mmol/L (ref 98–110)
Glucose, Bld: 93 mg/dL (ref 65–99)
POTASSIUM: 5.2 mmol/L (ref 3.5–5.3)
Sodium: 133 mmol/L — ABNORMAL LOW (ref 135–146)

## 2016-04-26 LAB — MAGNESIUM: MAGNESIUM: 1.7 mg/dL (ref 1.5–2.5)

## 2016-04-26 NOTE — Progress Notes (Signed)
SENT TO DR. Lowanda Foster

## 2016-05-07 ENCOUNTER — Telehealth: Payer: Self-pay | Admitting: Gastroenterology

## 2016-05-07 NOTE — Telephone Encounter (Signed)
PATIENT WIFE CALLED STATING THEY ARE STILL WAITING ON APPROVAL FROM INSURANCE FOR HIS XIFAXIN AND WOULD LIKE SOME SAMPLES UNTIL HE CAN GET A PRESCRIPTION APPROVED [phone]

## 2016-05-07 NOTE — Telephone Encounter (Signed)
Pt called and was informed to check with his pharmacy for the prescription now.

## 2016-05-07 NOTE — Telephone Encounter (Signed)
I called pt and was leaving a VM and someone picked up, something happened. I called back, line was busy. Per Almyra Free, she checked on pt's prescription and it has just been approved. He can go to the pharmacy for it.

## 2016-05-08 MED ORDER — RIFAXIMIN 550 MG PO TABS: 550.0000 mg | ORAL_TABLET | Freq: Two times a day (BID) | ORAL | 11 refills | Status: DC

## 2016-05-08 NOTE — Addendum Note (Signed)
Addended by: Danie Binder on: 05/08/2016 10:09 AM   Modules accepted: Orders

## 2016-05-08 NOTE — Telephone Encounter (Signed)
Tried to call and it would not go through.

## 2016-05-08 NOTE — Telephone Encounter (Signed)
PLEASE CALL PT.  Rx sent.  

## 2016-05-08 NOTE — Telephone Encounter (Signed)
Pt's wife called and was informed.

## 2016-05-23 ENCOUNTER — Encounter: Payer: Self-pay | Admitting: Gastroenterology

## 2016-06-04 ENCOUNTER — Telehealth: Payer: Self-pay

## 2016-06-04 ENCOUNTER — Ambulatory Visit (INDEPENDENT_AMBULATORY_CARE_PROVIDER_SITE_OTHER): Payer: Managed Care, Other (non HMO) | Admitting: Gastroenterology

## 2016-06-04 ENCOUNTER — Encounter: Payer: Self-pay | Admitting: Gastroenterology

## 2016-06-04 VITALS — BP 116/74 | HR 101 | Temp 97.1°F | Ht 74.0 in | Wt 285.2 lb

## 2016-06-04 DIAGNOSIS — K7290 Hepatic failure, unspecified without coma: Secondary | ICD-10-CM

## 2016-06-04 NOTE — Patient Instructions (Signed)
1. Please take our lab order forms to the lab when you have Dr. Rhona Leavens labs done.  2. Abdominal ultrasound in 06/2016 to follow up on your liver.  3. Continue to monitor for recurrent swelling.  4. Use lotion liberally for dry skin and try your best not to scratch. If symptoms worsen, please let me know.  5. Return to the office in 3 months or sooner if needed.  6. I will discuss with Dr. Oneida Alar, regarding if we need to increase the nadolol.

## 2016-06-04 NOTE — Telephone Encounter (Signed)
No PA needed for US abdomen complete per [facility].

## 2016-06-04 NOTE — Assessment & Plan Note (Addendum)
Due for update labs in the near future. He will have her labs done at time of Dr. Rhona Leavens. Will calculate MELD score at that time. Clinically he has been stable the last 3 months. No evidence of peripheral edema. Nadolol was cut down to 20 mg daily, his heart rate is now on the 100 range. He was having issues with hypotension at higher doses.  Currently he believes on Aldactone 25 mg daily. Torsemide was stopped. No longer on potassium or magnesium. He was able to get Xifaxan and continues lactulose.   Congratulated him on ongoing alcohol cessation. Patient tells me he has had no urge for alcohol use since July. He continues to maintain a 2 g sodium restricted diet.  We will make sure he is aware of upcoming appointment at [facility]. It is very important that he maintains ongoing relationship with transplant center and be reevaluated for candidacy.  We'll update abdominal ultrasound next month for hepatoma screening.  Return to see Dr. Oneida Alar in 3 months.

## 2016-06-04 NOTE — Progress Notes (Signed)
Doris, please make sure Mr. Daffron knows he has an appt with [facility] on 06/27/16 per EPIC. Very important he keeps this appt.

## 2016-06-04 NOTE — Progress Notes (Signed)
Primary Care Physician: Deloria Lair, MD  Primary Gastroenterologist:  [name], MD   Chief Complaint  Patient presents with  . Cirrhosis    HPI: Antonio [name] is a 46 y.o. male here for follow up. He feels well. Minimal lower extremity edema. Complains of itchy dry skin on lower legs. Trying to avoid scratching. Sticking to low sodium diet. No etoh since 11/2015. Keep BMs at least 3 per day with lactulose. Also on Xifaxan. No issues with confusion. No abdominal pain. No melena, brbpr. No heartburn. See Dr. Hinda Lenis early 06/2016 for labs.   03/23/16 324 lb 03/27/16 298 lb 04/11/16 275 lb 06/04/16 285 lb  Patient has been evaluated at [facility] for liver transplant candidacy. Deemed not a candidate until 6 months etoh sobriety which would be 06/2016. Patient has follow up appointment on 06/27/16 per EPIC.    Current Outpatient Prescriptions  Medication Sig Dispense Refill  . Calcium Carb-Cholecalciferol (CALCIUM 600+D) 600-800 MG-UNIT TABS Take 1 tablet by mouth 2 (two) times daily.     . folic acid (FOLVITE) 1 MG tablet Take 1 tablet (1 mg total) by mouth daily. 30 tablet 11  . lactulose (CHRONULAC) 10 GM/15ML solution Take 30 mLs (20 g total) by mouth 3 (three) times daily. 2700 mL 11  . nadolol (CORGARD) 40 MG tablet 1/2 TABLET DAILY 30 tablet 11  . pantoprazole (PROTONIX) 40 MG tablet Take 1 tablet (40 mg total) by mouth 2 (two) times daily before a meal. (Patient taking differently: Take 40 mg by mouth daily. ) 30 tablet 11  . rifaximin (XIFAXAN) 550 MG TABS tablet Take 1 tablet (550 mg total) by mouth 2 (two) times daily. 60 tablet 11  . spironolactone (ALDACTONE) 100 MG tablet Take 0.5 tablets (50 mg total) by mouth daily. 30 tablet 11  . thiamine 100 MG tablet Take 1 tablet (100 mg total) by mouth daily. 30 tablet 11   No current facility-administered medications for this visit.     Allergies as of 06/04/2016  . (No Known Allergies)    ROS:  General: Negative for  anorexia, weight loss, fever, chills, fatigue, weakness. ENT: Negative for hoarseness, difficulty swallowing , nasal congestion. CV: Negative for chest pain, angina, palpitations, dyspnea on exertion, peripheral edema.  Respiratory: Negative for dyspnea at rest, dyspnea on exertion, cough, sputum, wheezing.  GI: See history of present illness. GU:  Negative for dysuria, hematuria, urinary incontinence, urinary frequency, nocturnal urination.  Endo: Negative for unusual weight change.    Physical Examination:   BP (!) 116/7   Pulse (!) 101   Temp 97.1 F (36.2 C) (Oral)   Ht 6\' 2"  (1.88 m)   Wt 285 lb 3.2 oz (129.4 kg)   BMI 36.62 kg/m   General: Well-nourished, well-developed in no acute distress.  Eyes: No icterus. Mouth: Oropharyngeal mucosa moist and pink , no lesions erythema or exudate. Lungs: Clear to auscultation bilaterally.  Heart: Regular rate and rhythm, no murmurs rubs or gallops.  Abdomen: Bowel sounds are normal, nontender, nondistended, no hepatosplenomegaly or masses, no abdominal bruits or hernia , no rebound or guarding.   Extremities: No lower extremity edema. No clubbing or deformities. Few excoriations on anterior shins. No erythema or exudate.  Neuro: Alert and oriented x 4   Skin: Warm and dry, no jaundice.   Psych: Alert and cooperative, normal mood and affect.  Labs:  Lab Results  Component Value Date   CREATININE 1.01 04/25/2016   BUN 13 04/25/2016  NA 133 (L) 04/25/2016   K 5.2 04/25/2016   CL 106 04/25/2016   CO2 16 (L) 04/25/2016   Lab Results  Component Value Date   WBC 10.7 04/17/2016   HGB 10.1 (L) 04/17/2016   HCT 32.0 (L) 04/17/2016   MCV 90.4 04/17/2016   PLT 266 04/17/2016   Magnesium 1.7  Imaging Studies: No results found.

## 2016-06-04 NOTE — Progress Notes (Signed)
I left VM for pt to call and let me know that he is aware of the appt at [facility] on 06/27/2016.

## 2016-06-05 NOTE — Progress Notes (Signed)
Pt's wife Donovan Kail is aware of the appt on 06/27/2016 at [name].

## 2016-06-05 NOTE — Progress Notes (Signed)
cc'd to pcp 

## 2016-06-07 ENCOUNTER — Telehealth: Payer: Self-pay | Admitting: *Deleted

## 2016-06-07 NOTE — Telephone Encounter (Signed)
Working on [employer], just received fax from the pharmacy this afternoon.

## 2016-06-07 NOTE — Telephone Encounter (Signed)
Patient wife called and stated that the xiafaxin he takes needed another prior authorization.  Wanted to make sure we were aware

## 2016-06-11 ENCOUNTER — Telehealth: Payer: Self-pay

## 2016-06-11 NOTE — Addendum Note (Signed)
Addended by: Mahala Menghini on: 06/11/2016 03:14 PM   Modules accepted: Orders

## 2016-06-11 NOTE — Progress Notes (Signed)
Antonio [name], please see med list that I just updated. Pt's wife said Dr. Oneida Alar stopped the Lasix several months ago when he was in the hospital. Dr. Lynnette Caffey stopped the [place] about 2 months ago. Spironolactone not filled because dosage decreased and they cut the table in half. Torsemide 20 mg, pt just takes 1/2 tablet when he has extra swelling.

## 2016-06-11 NOTE — Progress Notes (Signed)
PT's wife is aware. She is checking with Dr. Lynnette Caffey and will let us know.

## 2016-06-11 NOTE — Progress Notes (Signed)
Nadolol is important to reduce risk of esophageal varciceal bleeding.    His pulse is back up and I would recommend he go back on nadolol, we can start at lower dose of 20mg  daily if ok with Dr. Hinda Lenis

## 2016-06-11 NOTE — Telephone Encounter (Signed)
Reviewed med list with pt's wife.

## 2016-06-11 NOTE — Telephone Encounter (Signed)
I sent the note back to you. Pt's wife will check with Dr. Lynnette Caffey about the medication.

## 2016-06-11 NOTE — Progress Notes (Addendum)
Please call patient's wife and verify his medications. I have reviewed copy of med list from [place]. Doris, I have a copy on my desk.  Medications of concern ie is he taking and what dose if he is on it Nadolol - has not filled since 03/24/16 Spironolactone - has not filled since 03/29/16 Torsedmide - filled 06/02/16, 20mg  tablet 1/2 tablet daily Lasix - has not filled since 03/03/16

## 2016-06-11 NOTE — Telephone Encounter (Signed)
See ov addendum

## 2016-06-12 NOTE — Telephone Encounter (Signed)
Noted  

## 2016-06-18 NOTE — Progress Notes (Signed)
I spoke to pt's wife, Gwynn. She said Dr. Lynnette Caffey was in agreement for the Nadalol 20 mg qd and he sent in the prescription for it.

## 2016-06-18 NOTE — Progress Notes (Signed)
Left message for Gwynn to call me. ( She is at work now).

## 2016-06-20 NOTE — Progress Notes (Signed)
Noted. Thanks.

## 2016-06-29 ENCOUNTER — Telehealth: Payer: Self-pay

## 2016-06-29 NOTE — Telephone Encounter (Signed)
Left the message on VM for pt.  

## 2016-06-29 NOTE — Telephone Encounter (Signed)
OTC guaifenesin as per package.

## 2016-06-29 NOTE — Telephone Encounter (Signed)
Pt would like to know what he can take OTC for a cough and runny nose. He does not have a fever. Just a little runny nose and cough with a little phlegm but no color to it. Please advise!

## 2016-07-02 ENCOUNTER — Ambulatory Visit (HOSPITAL_COMMUNITY)
Admission: RE | Admit: 2016-07-02 | Discharge: 2016-07-02 | Disposition: A | Discharge status: Home / Self Care | Payer: Managed Care, Other (non HMO) | Source: Ambulatory Visit | Attending: Gastroenterology | Admitting: Gastroenterology

## 2016-07-02 DIAGNOSIS — K8020 Calculus of gallbladder without cholecystitis without obstruction: Secondary | ICD-10-CM | POA: Diagnosis not present

## 2016-07-02 DIAGNOSIS — K7290 Hepatic failure, unspecified without coma: Secondary | ICD-10-CM | POA: Insufficient documentation

## 2016-07-08 NOTE — Progress Notes (Signed)
No hepatoma.  Next ruq u/s in 6 months for cirrhosis/hepatoma screening. Please NIC. See if patient took our lab orders with him to Dr. Rhona Leavens office to have don.

## 2016-07-09 NOTE — Progress Notes (Signed)
LMOM for a return call.  

## 2016-07-10 NOTE — Progress Notes (Signed)
Pt's wife is aware. She said he did have labs done at Dr. Rhona Leavens. Routing to Manuela Schwartz to get those results.

## 2016-07-13 ENCOUNTER — Telehealth: Payer: Self-pay | Admitting: Gastroenterology

## 2016-07-13 NOTE — Telephone Encounter (Signed)
Received labs done January 2018 Creatinine 0.87, total bilirubin 3.2, alkaline phosphatase 283, AST 69, ALT 25, albumin 2.6, iron 42, TIBC 273, iron saturations 15%, INR 1.4, white blood cell count 6.6, hemoglobin 10.8, hematocrit 34.3, MCV 83.9, platelets 184,000, ferritin 37  MELD 18, improved since 03/2016 was 29 at that time.   Continue to avoid etoh. Keep ov with [facility].

## 2016-07-17 NOTE — Telephone Encounter (Signed)
LMOM to call.

## 2016-07-17 NOTE — Telephone Encounter (Signed)
PT's wife is aware.

## 2016-07-31 ENCOUNTER — Encounter: Payer: Self-pay | Admitting: Gastroenterology

## 2016-08-08 ENCOUNTER — Encounter: Payer: Self-pay | Admitting: Gastroenterology

## 2016-08-22 IMAGING — US US ABDOMEN LIMITED
1 series · 14 of 25 positions shown · non-contrast
Comparison: None.

CLINICAL DATA: Cirrhosis with questionable portal vein thrombosis.

EXAM:
US ABDOMEN LIMITED - RIGHT UPPER QUADRANT

[Series 1: us abdomen limited · 0.28mm/px · 14 of 60 slices shown]
[im 1/60]
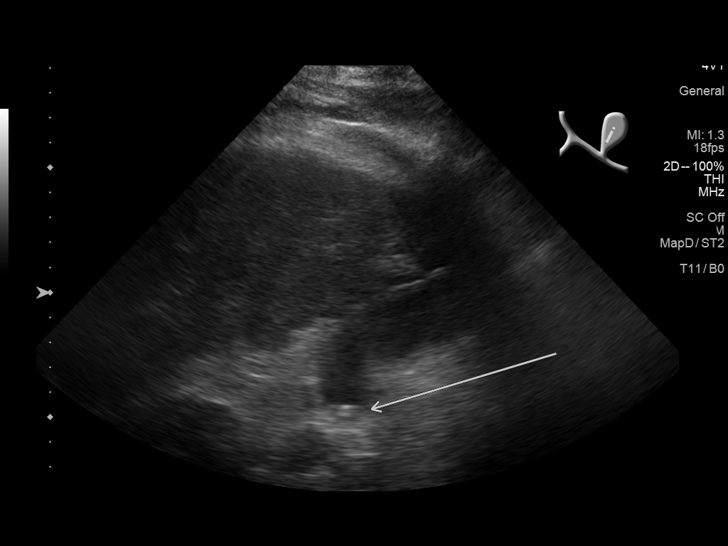
[im 5/60]
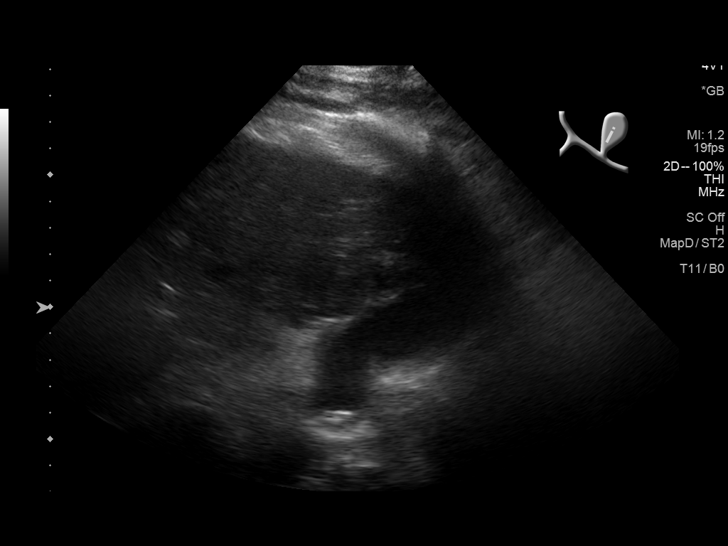
[im 10/60]
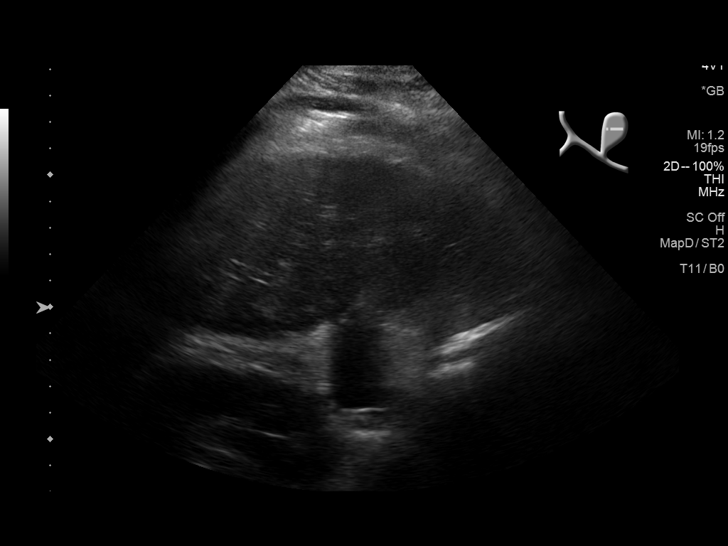
[im 15/60]
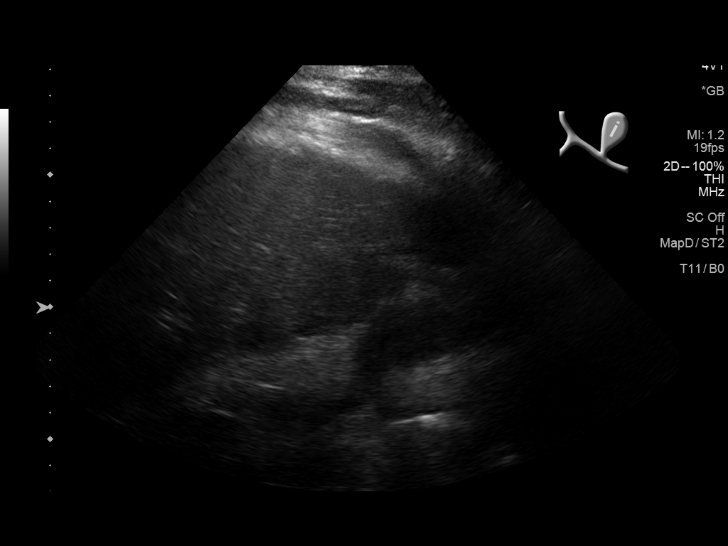
[im 20/60]
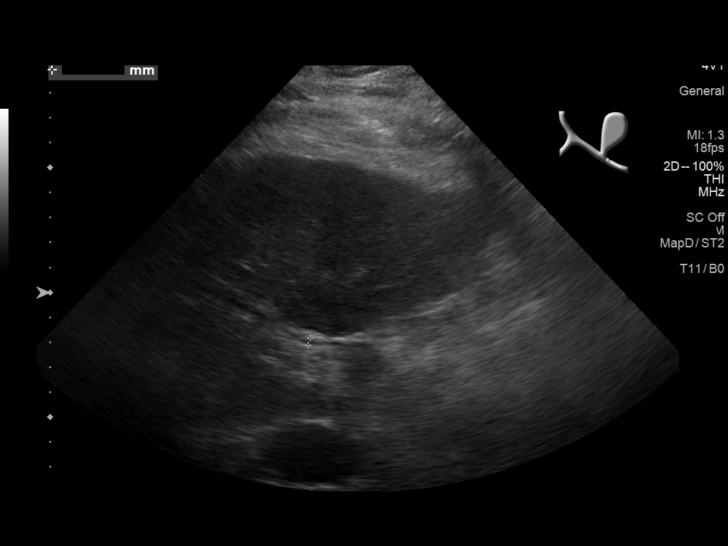
[im 23/60]
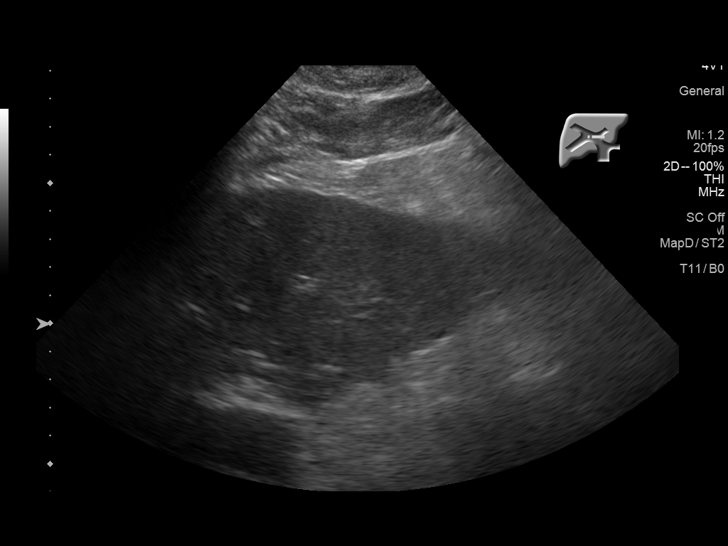
[im 28/60]
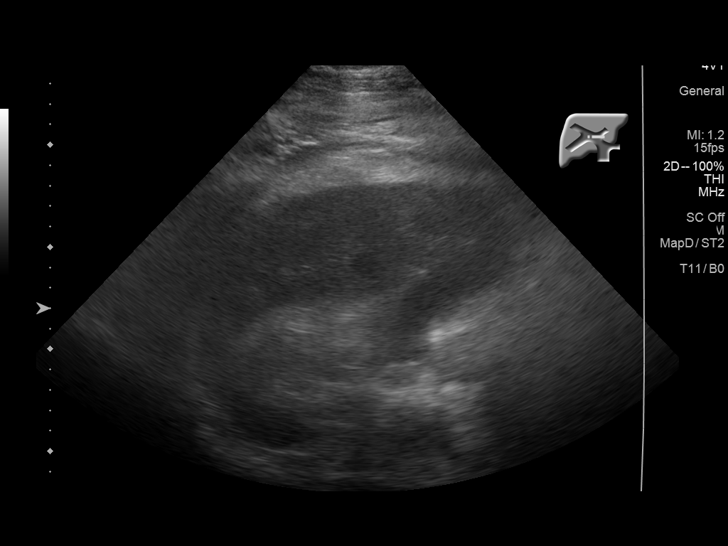
[im 32/60]
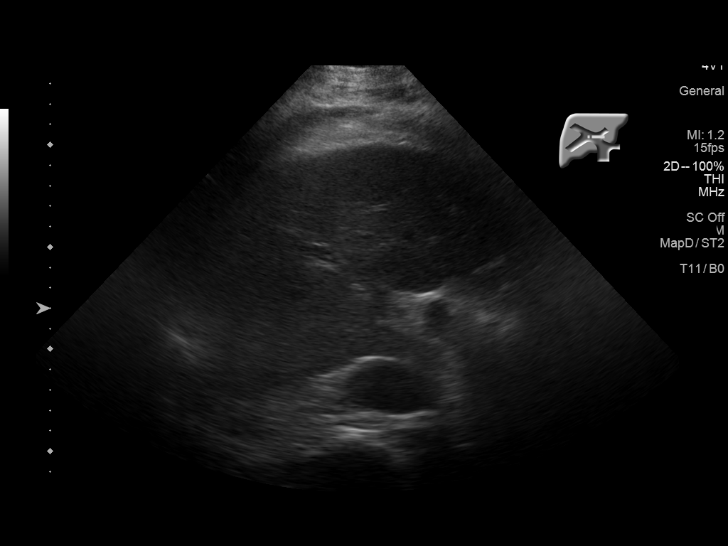
[im 37/60]
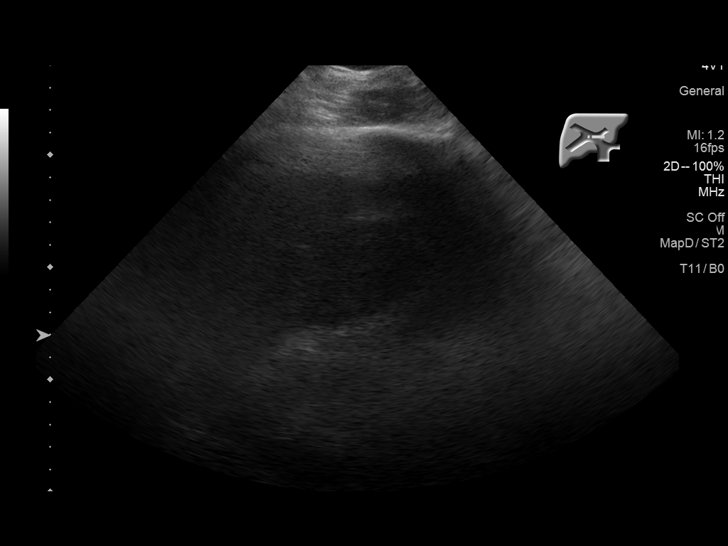
[im 40/60]
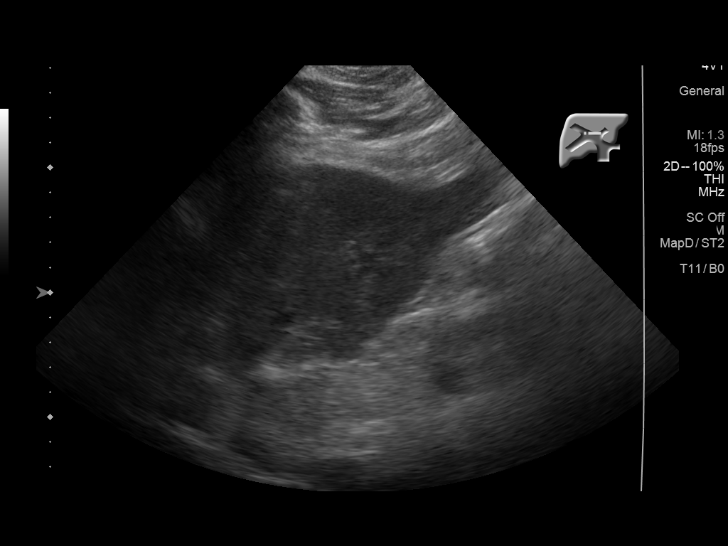
[im 45/60]
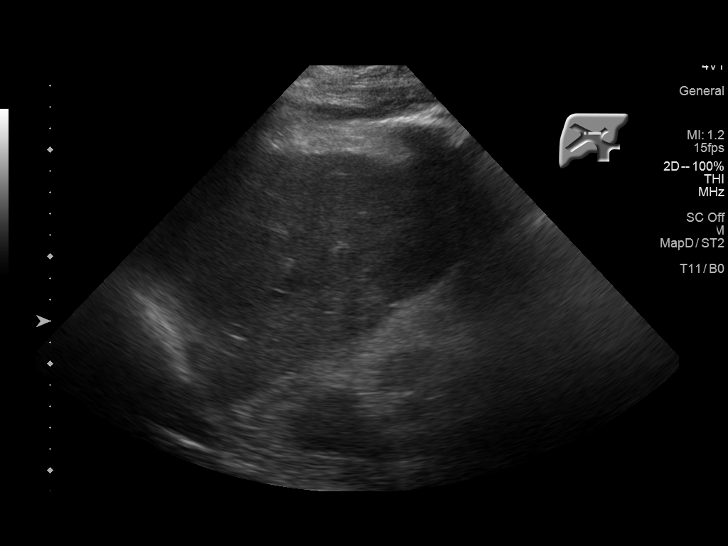
[im 50/60]
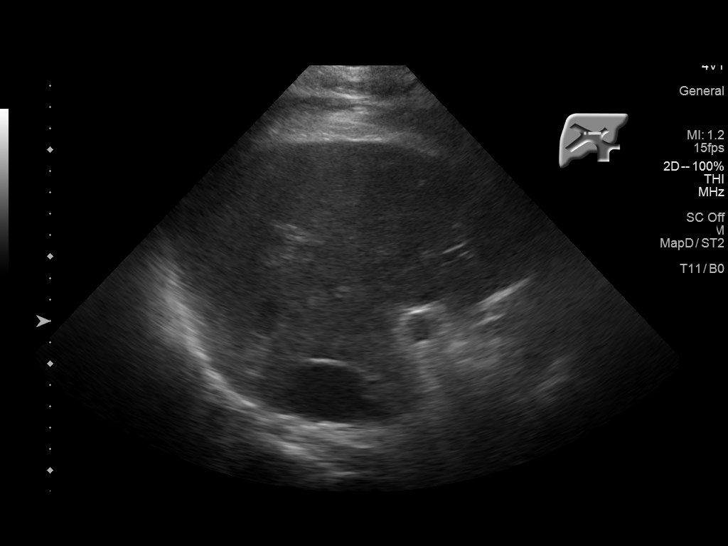
[im 55/60]
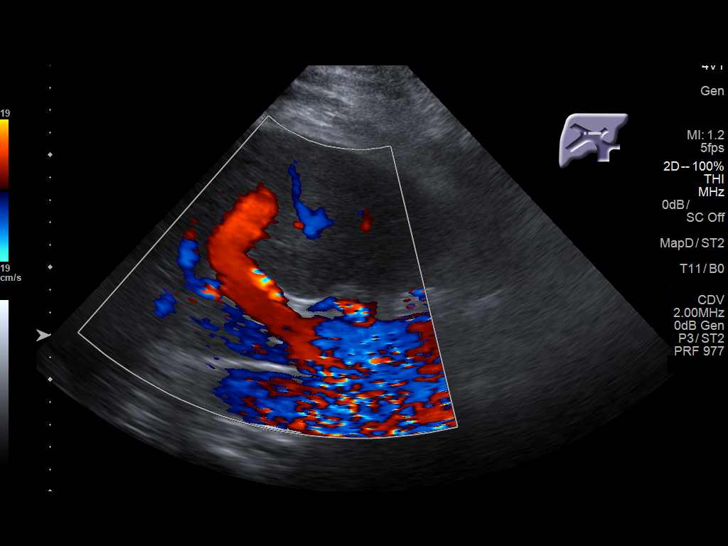
[im 60/60]
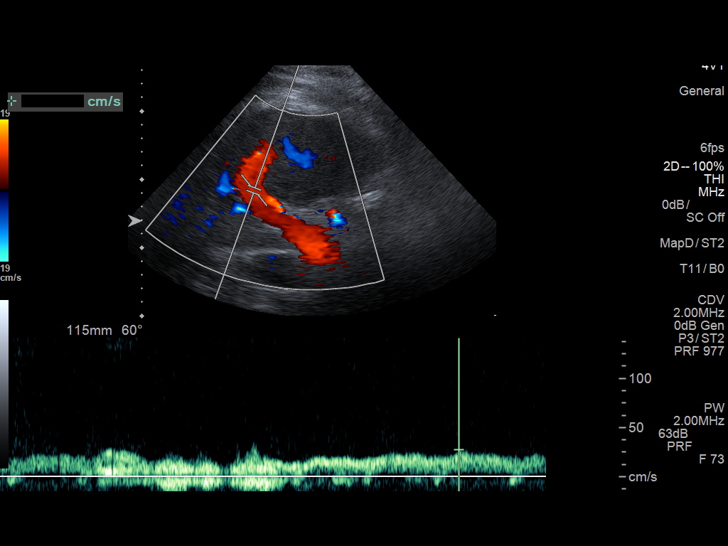

[14 of 25 positions shown; findings below may reference images not displayed]

FINDINGS: Exam somewhat difficult due to patient body habitus.

Gallbladder:

Mild cholelithiasis with a 1.1 cm stone over the region of the
gallbladder neck. Borderline wall thickening measuring 3.4 mm. No
pericholecystic fluid. Negative sonographic Murphy sign.

Common bile duct:

Diameter: 3.0 mm.

Liver:

Somewhat heterogeneous echotexture with nodular contour compatible
with known cirrhosis. No focal mass.

Portal vein is patent with normal hepatopetal flow and normal
Doppler waveform and velocity of 27 cm/sec.
IMPRESSION: Evidence of known cirrhosis. Patent portal vein with normal
hepatopetal flow and normal velocity of 27 cm/sec.

Mild cholelithiasis with 1.1 cm stone over the region of the
gallbladder neck. No additional sonographic evidence to suggest
cholecystitis.

## 2016-08-27 IMAGING — US US EXTREM  UP VENOUS*L*
1 series · 13 of 24 positions shown · non-contrast
Comparison: None.

CLINICAL DATA: Left upper extremity pain and edema.



[Series 1: us extrem up venous*left* · 0.14mm/px · 13 of 41 slices shown]
[im 1/41]
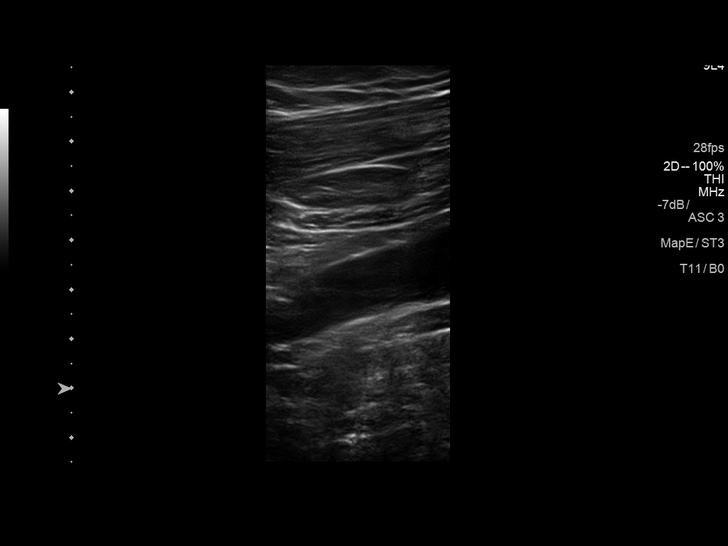
[im 4/41]
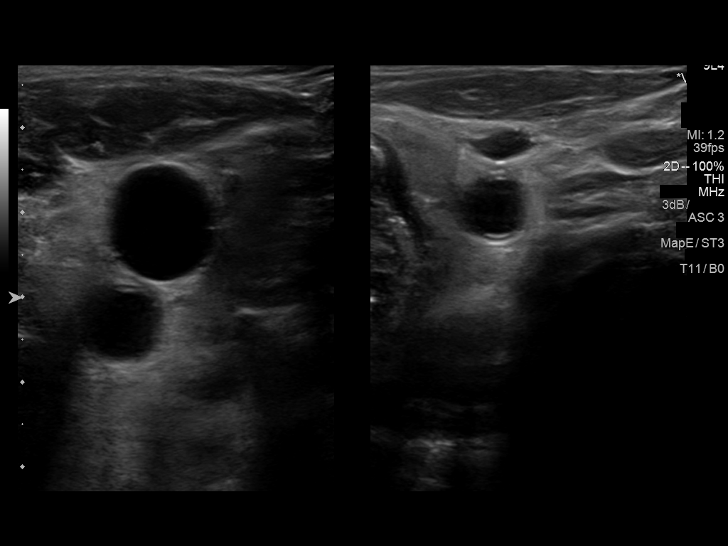
[im 7/41]
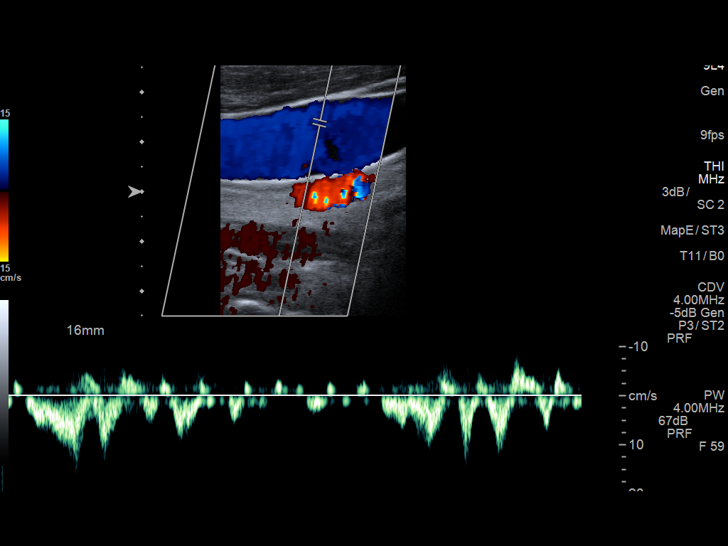
[im 11/41]
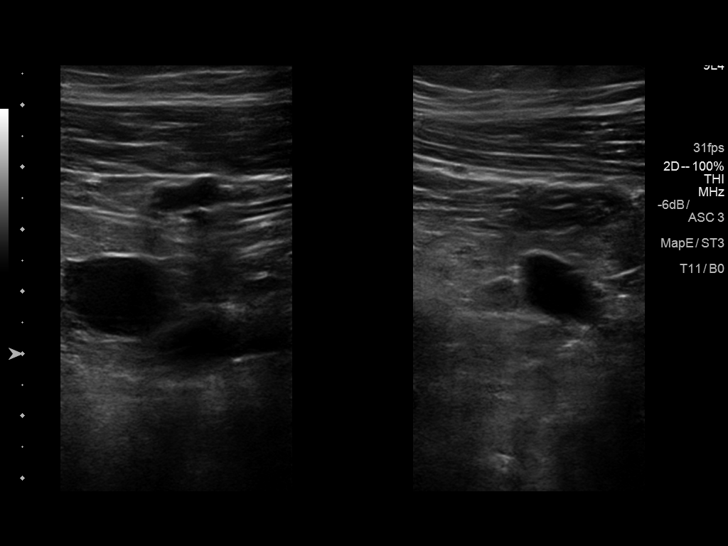
[im 14/41]
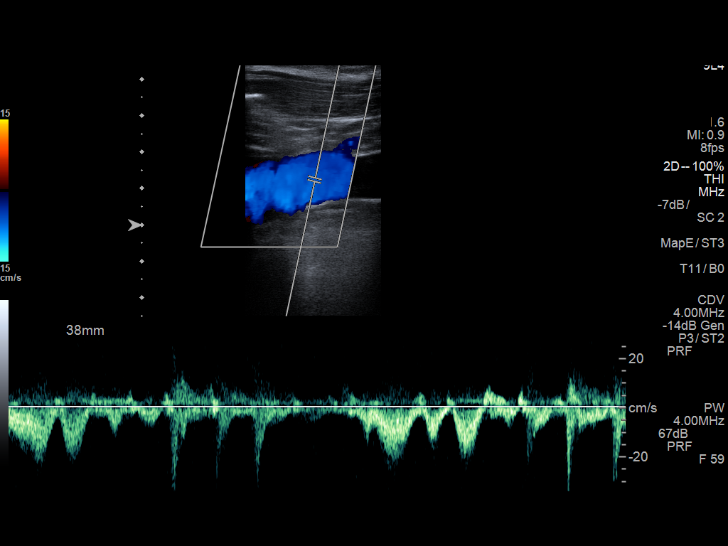
[im 18/41]
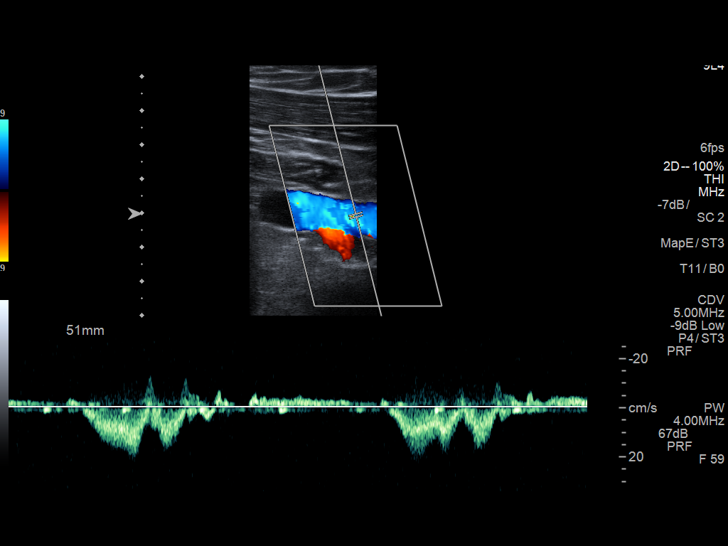
[im 21/41]
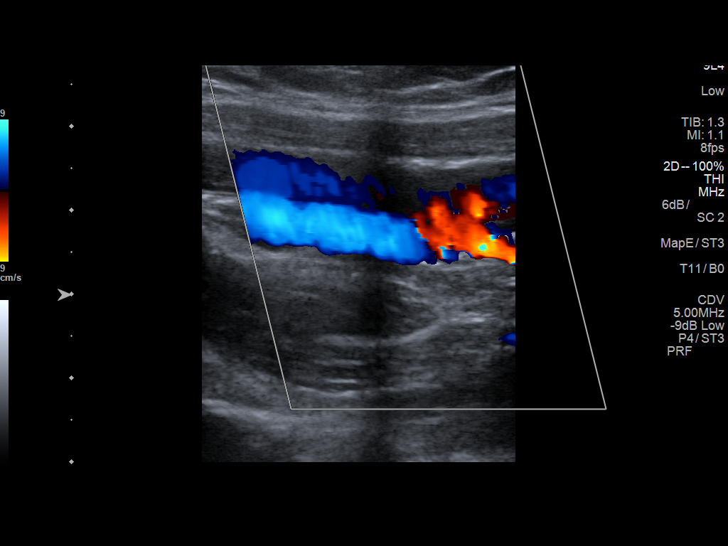
[im 23/41]
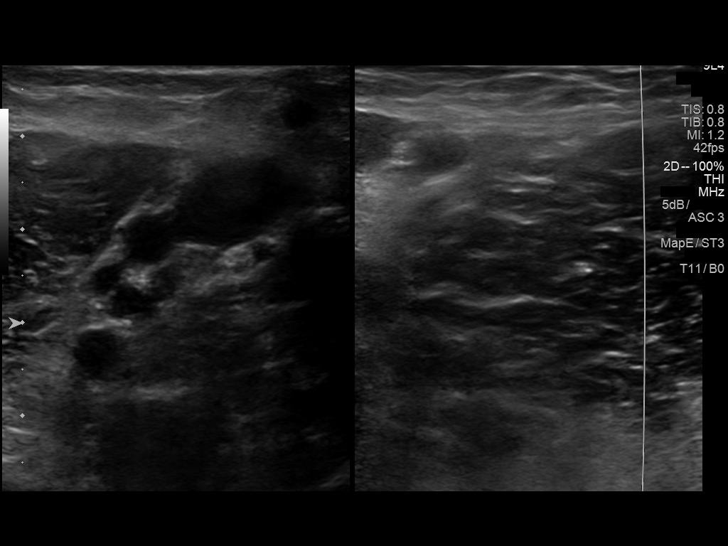
[im 27/41]
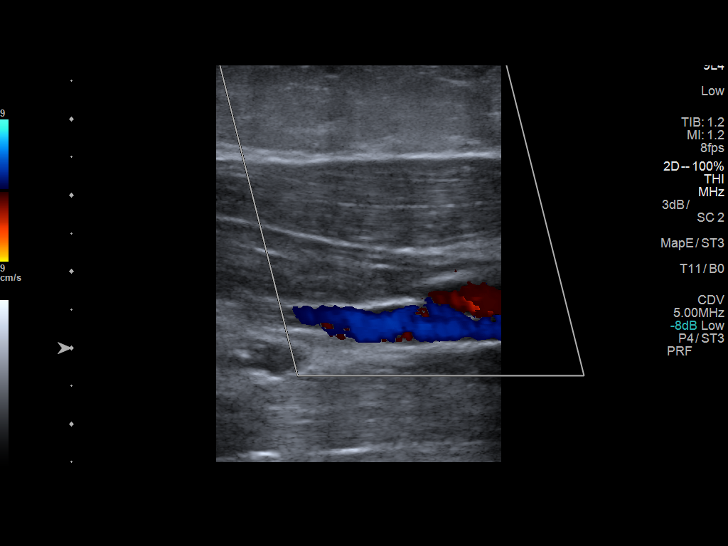
[im 30/41]
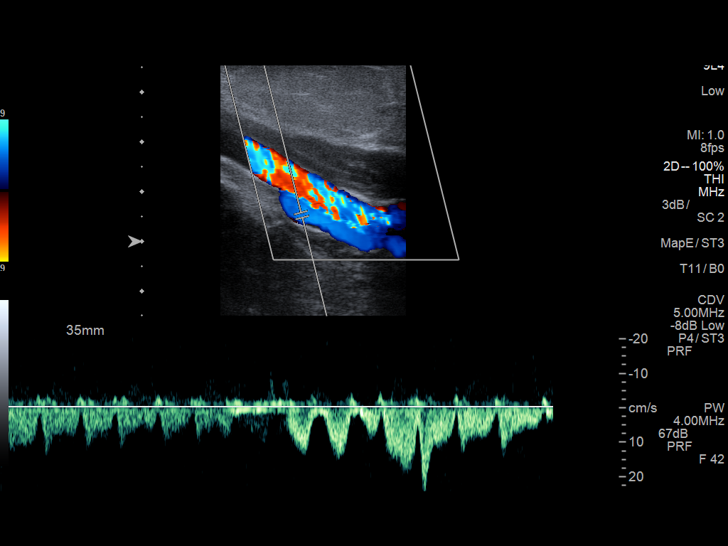
[im 34/41]
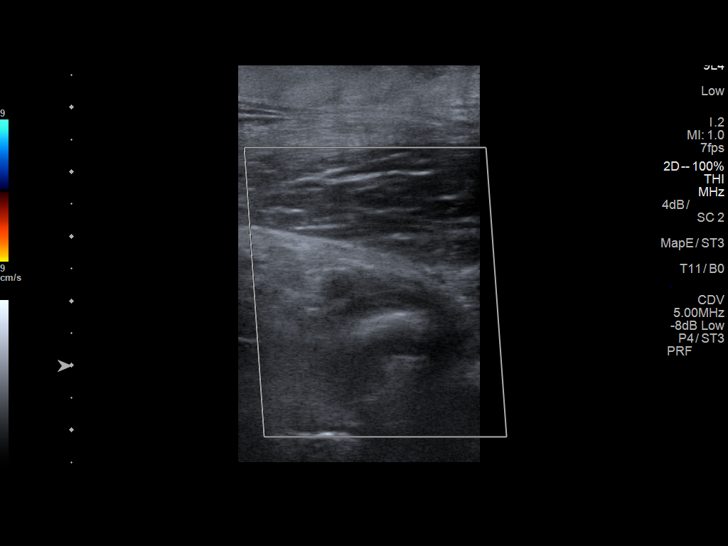
[im 37/41]
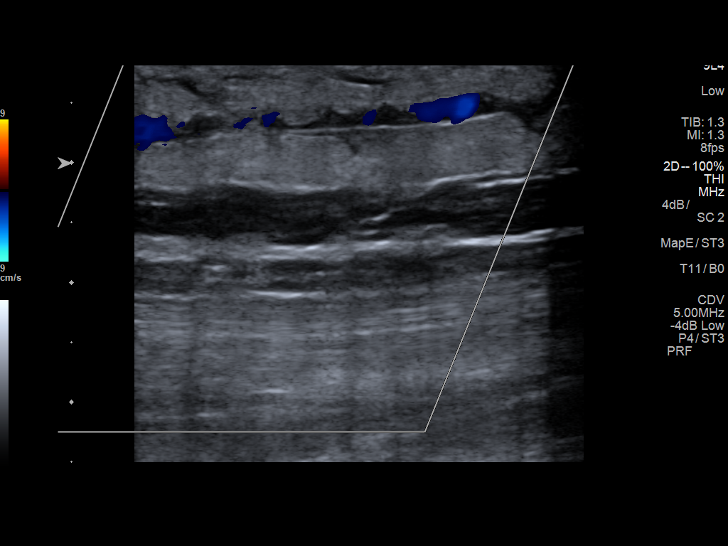
[im 41/41]
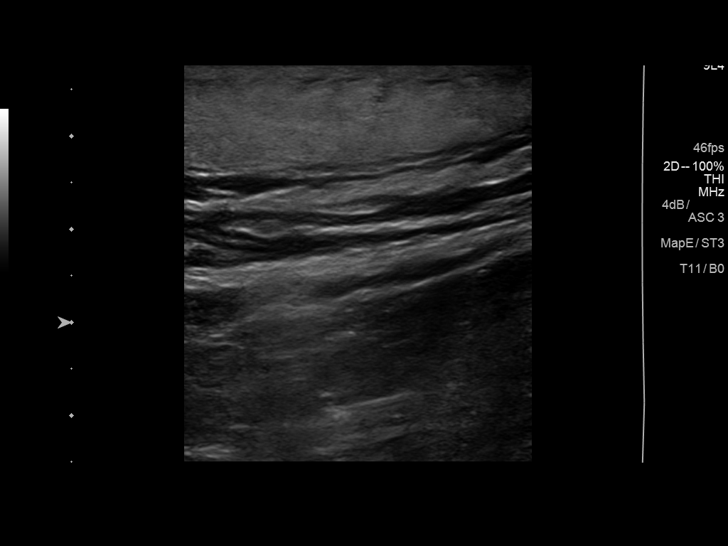

[13 of 24 positions shown; findings below may reference images not displayed]

FINDINGS: Contralateral Subclavian Vein: Respiratory phasicity is normal and
symmetric with the symptomatic side. No evidence of thrombus. Normal
compressibility.

Internal Jugular Vein: No evidence of thrombus. Normal
compressibility, respiratory phasicity and response to augmentation.

Subclavian Vein: No evidence of thrombus. Normal compressibility,
respiratory phasicity and response to augmentation.

Axillary Vein: No evidence of thrombus. Normal compressibility,
respiratory phasicity and response to augmentation.

Cephalic Vein: No evidence of thrombus. Normal compressibility,
respiratory phasicity and response to augmentation.

Basilic Vein: No evidence of thrombus. Normal compressibility,
respiratory phasicity and response to augmentation.

Brachial Veins: No evidence of thrombus. Normal compressibility,
respiratory phasicity and response to augmentation.

Radial Veins: No evidence of thrombus. Normal compressibility,
respiratory phasicity and response to augmentation.

Ulnar Veins: No evidence of thrombus. Normal compressibility,
respiratory phasicity and response to augmentation.

Venous Reflux:  None visualized.

Other Findings: No evidence of superficial thrombophlebitis or
abnormal fluid collection.
IMPRESSION: No evidence of left upper extremity deep venous thrombosis.

## 2016-09-13 ENCOUNTER — Other Ambulatory Visit: Payer: Self-pay

## 2016-09-13 ENCOUNTER — Ambulatory Visit (INDEPENDENT_AMBULATORY_CARE_PROVIDER_SITE_OTHER): Payer: Managed Care, Other (non HMO) | Admitting: Gastroenterology

## 2016-09-13 ENCOUNTER — Other Ambulatory Visit: Payer: Self-pay | Admitting: Gastroenterology

## 2016-09-13 ENCOUNTER — Encounter: Payer: Self-pay | Admitting: Gastroenterology

## 2016-09-13 DIAGNOSIS — N62 Hypertrophy of breast: Secondary | ICD-10-CM | POA: Diagnosis not present

## 2016-09-13 DIAGNOSIS — I8500 Esophageal varices without bleeding: Secondary | ICD-10-CM | POA: Insufficient documentation

## 2016-09-13 DIAGNOSIS — K7030 Alcoholic cirrhosis of liver without ascites: Secondary | ICD-10-CM | POA: Diagnosis not present

## 2016-09-13 DIAGNOSIS — K7290 Hepatic failure, unspecified without coma: Secondary | ICD-10-CM

## 2016-09-13 DIAGNOSIS — T50905A Adverse effect of unspecified drugs, medicaments and biological substances, initial encounter: Secondary | ICD-10-CM

## 2016-09-13 DIAGNOSIS — K7682 Hepatic encephalopathy: Secondary | ICD-10-CM

## 2016-09-13 DIAGNOSIS — K625 Hemorrhage of anus and rectum: Secondary | ICD-10-CM

## 2016-09-13 MED ORDER — NADOLOL 20 MG PO TABS: 20.0000 mg | ORAL_TABLET | Freq: Two times a day (BID) | ORAL | 11 refills | Status: DC

## 2016-09-13 NOTE — Progress Notes (Signed)
Subjective:    Patient ID: Antonio [name], male    DOB: May 17, 1971, 46 y.o.   MRN: [id number]  TAPPER,DAVID B, MD  HPI No ETOH JUL 2017. HAS APPT AT [facility] NEXT MO. C/O PAINFUL BREAST AND SWELLING WHILE TAKING ALDACTONE 50 MG DAILY. BEEN EXERCISING 2X/WEEK. WENT FISHING LAST WEEK CAUGHT 4 FISH. BMS: REGULAR IF EATS SOMETHING THAT MAY CONSTIPATED WILL TAKE LACTULOSE. LACTULOSE QOD. TAKING METOPROLOL. NO SEXUALLY DYSFUNCTION OR EXERCISE INTOLERANCE.  PT DENIES FEVER, CHILLS, HEMATOCHEZIA, HEMATEMESIS, nausea, vomiting, melena, diarrhea, CHEST PAIN, SHORTNESS OF BREATH,  CHANGE IN BOWEL IN HABITS, constipation, abdominal pain, problems swallowing, OR heartburn or indigestion.  Past Medical History:  Diagnosis Date  . Alcohol abuse   . Alcoholic hepatitis   . Cirrhosis with alcoholism [facility])    Past Surgical History:  Procedure Laterality Date  . BIOPSY  03/08/2016   Procedure: BIOPSY;  Surgeon: Daneil Dolin, MD;  Location: [facility];  Service: Endoscopy;;  descending colon biopsies  . Colonoscopy     per patient around 2014 in [place]   . COLONOSCOPY WITH PROPOFOL [name] 03/08/2016   Procedure: COLONOSCOPY WITH PROPOFOL;  Surgeon: Daneil Dolin, MD;  Location: [facility];  Service: Endoscopy;  Laterality: [name];  . ESOPHAGOGASTRODUODENOSCOPY (EGD) WITH PROPOFOL [name] 12/28/2015   Dr. Oneida Alar: 4 columns of esophageal varices, 3 grade 1, 1 grade 2 with no evidence of bleeding. Mild portal gastropathy with no evidence of active bleeding.  . ESOPHAGOGASTRODUODENOSCOPY (EGD) WITH PROPOFOL [name] 03/08/2016   Procedure: ESOPHAGOGASTRODUODENOSCOPY (EGD) WITH PROPOFOL;  Surgeon: Daneil Dolin, MD;  Location: [facility];  Service: Endoscopy;  Laterality: [name];  . POLYPECTOMY  03/08/2016   Procedure: POLYPECTOMY;  Surgeon: Daneil Dolin, MD;  Location: [facility];  Service: Endoscopy;;  cecum and descending     No Known Allergies  Current Outpatient Prescriptions  Medication Sig Dispense Refill    . Calcium Carb-Cholecalciferol (CALCIUM 600+D) 600-800 MG-UNIT TABS Take 1 tablet by mouth 2 (two) times daily.     Marland Kitchen lactulose (CHRONULAC) 10 GM/15ML solution Take 30 mLs (20 g total) by mouth 3 (three) times daily. PRN   . metoprolol succinate (TOPROL-XL) 25 MG 24 hr tablet Take 25 mg by mouth daily.     . pantoprazole (PROTONIX) 40 MG tablet Take 1 tablet daily. )    . rifaximin (XIFAXAN) 550 MG TABS tablet Take 1 tablet (550 mg total) by mouth 2 (two) times daily.    Marland Kitchen spironolactone (ALDACTONE) 100 MG tablet Take 0.5 tablets (50 mg total) by mouth daily.    Marland Kitchen thiamine 100 MG tablet Take 1 tablet (100 mg total) by mouth daily.    Marland Kitchen FOLIC ACID  1 MG DAILY    . torsemide (DEMADEX) 20 MG tablet Take 20 mg by mouth daily. PT takes 1/2 tablet when he has extra swelling. PRN     Review of Systems PER HPI OTHERWISE ALL SYSTEMS ARE NEGATIVE.    Objective:   Physical Exam  Constitutional: He is oriented to person, place, and time. He appears well-developed and well-nourished. No distress.  HENT:  Head: Normocephalic and atraumatic.  Mouth/Throat: Oropharynx is clear and moist. No oropharyngeal exudate.  Eyes: Pupils are equal, round, and reactive to light. No scleral icterus.  Neck: Normal range of motion. Neck supple.  Cardiovascular: Normal rate, regular rhythm and normal heart sounds.   Pulmonary/Chest: Effort normal and breath sounds normal. No respiratory distress.  Abdominal: Soft. Bowel sounds are normal. He  exhibits no distension. There is no tenderness.  Musculoskeletal: He exhibits no edema.  Lymphadenopathy:    He has no cervical adenopathy.  Neurological: He is alert and oriented to person, place, and time.  NO FOCAL DEFICITS  Psychiatric: He has a normal mood and affect.  Vitals reviewed.     Assessment & Plan:

## 2016-09-13 NOTE — Assessment & Plan Note (Addendum)
GRADE II(SMALL) IN OCT 2017 AND NOW TOLERATING ON A SELECTIVE BB-HR 94.  STOP METOPROLOL. CHANGE TO NADOLOL 20 MG TWICE DAILY. MED SIDE EFFECTS DISCUSSED. PT  WILL CALL WITH QUESTIONS OR CONCERNS. FOLLOW UP IN 6 MOS.

## 2016-09-13 NOTE — Assessment & Plan Note (Signed)
SYMPTOMS CONTROLLED/RESOLVED.  CONTINUE TO MONITOR SYMPTOMS. 

## 2016-09-13 NOTE — Assessment & Plan Note (Signed)
WELL COMPENSATED DISEASE.  STOP METOPROLOL, FOLIC ACID, VITAMIN B1, AND SPIRONOLACTONE. USE TORSEMIDE IF NEEDED FOR LEG SWELLING. COMPLETE LABS WITHIN THE NEXT 7 DAYS: CBC/CMP/PT/INR. CONTINUE XIFAXAN. REPEAT ULTRASOUND IN 6 MOS 1 WEEK PRIOR TO YOUR NEXT VISIT.

## 2016-09-13 NOTE — Patient Instructions (Addendum)
STOP METOPROLOL, FOLIC ACID, VITAMIN B1, AND SPIRONOLACTONE.  USE TORSEMIDE IF NEEDED FOR LEG SWELLING.  CHANGE TO NADOLOL 20 MG TWICE DAILY. NADOLOL IS USED TO DECREASE RISK OF BLEEDING FROM BULGING VEINS IN YOUR ESOPAHAGUS. SEE INFO REGARDING NADOLOL BELOW.  CONTINUE XIFAXAN.   USE LACTULOSE IF NEEDED TO PREVENT CONSTIPATION.   COMPLETE LABS WITHIN THE NEXT 7 DAYS.  REPEAT ULTRASOUND IN 6 MOS 1 WEEK PRIOR TO YOUR NEXT VISIT.    Nadolol tablets  What is this medicine?   NADOLOL (nay DOE lole) is a beta-blocker. Beta-blockers reduce the workload on the heart and help it to beat more regularly. This medicine is used to treat high blood pressure and to relieve chest pain caused by angina.  This medicine may be used for other purposes; ask your health care provider or pharmacist if you have questions.   How should I use this medicine?  Take this medicine by mouth with a glass of water. Follow the directions on the prescription label. You can take this medicine with or without food. Take your doses at regular intervals. Do not take your medicine more often than directed. Do not stop taking this medicine suddenly. This could lead to serious heart-related effects.    What if I miss a dose?   If you miss a dose, take it as soon as you can. If it is almost time for your next dose, take only that dose. Do not take double or extra doses.   What should I watch for?  Visit your doctor or health care professional for regular check ups. Check your blood pressure and pulse rate regularly. Ask your health care professional what your blood pressure and pulse rate should be, and when you should contact them. You may get drowsy or dizzy. Do not drive, use machinery, or do anything that needs mental alertness until you know how this drug affects you. Do not stand or sit up quickly, especially if you are an older patient. This reduces the risk of dizzy or fainting spells. Alcohol can make you more drowsy  and dizzy. Avoid alcoholic drinks.    What side effects may I notice from receiving this medicine?  Side effects that you should report to your doctor or health care professional as soon as possible:  -allergic reactions like skin rash, itching or hives  -cold, tingling, or numb hands or feet  -difficulty breathing, wheezing  -irregular heart beat  -mental depression  -slow heart rate  -swelling of the legs or ankles  -FATIGUE -change in sex drive or performance  -dry itching skin  -headache

## 2016-09-13 NOTE — Assessment & Plan Note (Addendum)
NO SIGNS OR SYMPTOMS OF ELEVATED AMMONIA ON XIFAXAN.  CONTINUE XIFAXAN. AWAIT LABS. CONSIDER STOPPING IF PT Antonio [name]. USE LACTULOSE TO PREVENT CONSTIPATION

## 2016-09-13 NOTE — Assessment & Plan Note (Signed)
DUE TO ALDACTONE. 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  STOP ALDACTONE. TORSEMIDE PRN.

## 2016-09-13 NOTE — Progress Notes (Signed)
ON RECALL  °

## 2016-09-13 NOTE — Progress Notes (Signed)
CC'ED TO PCP 

## 2016-09-18 LAB — COMPREHENSIVE METABOLIC PANEL
ALT: 25 U/L (ref 9–46)
AST: 68 U/L — ABNORMAL HIGH (ref 10–40)
Albumin: 2.4 g/dL — ABNORMAL LOW (ref 3.6–5.1)
Alkaline Phosphatase: 208 U/L — ABNORMAL HIGH (ref 40–115)
BUN: 19 mg/dL (ref 7–25)
CHLORIDE: 103 mmol/L (ref 98–110)
CO2: 27 mmol/L (ref 20–31)
CREATININE: 0.95 mg/dL (ref 0.60–1.35)
Calcium: 8.2 mg/dL — ABNORMAL LOW (ref 8.6–10.3)
Glucose, Bld: 83 mg/dL (ref 65–99)
Potassium: 3.8 mmol/L (ref 3.5–5.3)
SODIUM: 136 mmol/L (ref 135–146)
Total Bilirubin: 3.4 mg/dL — ABNORMAL HIGH (ref 0.2–1.2)
Total Protein: 6.6 g/dL (ref 6.1–8.1)

## 2016-09-18 LAB — CBC WITH DIFFERENTIAL/PLATELET
BASOS ABS: 67 {cells}/uL (ref 0–200)
BASOS PCT: 1 %
Eosinophils Absolute: 335 cells/uL (ref 15–500)
Eosinophils Relative: 5 %
HEMATOCRIT: 29.6 % — AB (ref 38.5–50.0)
HEMOGLOBIN: 9.3 g/dL — AB (ref 13.2–17.1)
LYMPHS ABS: 2144 {cells}/uL (ref 850–3900)
Lymphocytes Relative: 32 %
MCH: 25.5 pg — ABNORMAL LOW (ref 27.0–33.0)
MCHC: 31.4 g/dL — AB (ref 32.0–36.0)
MCV: 81.3 fL (ref 80.0–100.0)
Monocytes Absolute: 737 cells/uL (ref 200–950)
Monocytes Relative: 11 %
NEUTROS PCT: 51 %
Neutro Abs: 3417 cells/uL (ref 1500–7800)
PLATELETS: 196 10*3/uL (ref 140–400)
RBC: 3.64 MIL/uL — ABNORMAL LOW (ref 4.20–5.80)
RDW: 16.9 % — AB (ref 11.0–15.0)
WBC: 6.7 10*3/uL (ref 3.8–10.8)

## 2016-09-18 LAB — PROTIME-INR
INR: 1.4 — ABNORMAL HIGH
Prothrombin Time: 14.4 s — ABNORMAL HIGH (ref 9.0–11.5)

## 2016-10-06 ENCOUNTER — Telehealth: Payer: Self-pay | Admitting: Gastroenterology

## 2016-10-06 DIAGNOSIS — K7030 Alcoholic cirrhosis of liver without ascites: Secondary | ICD-10-CM

## 2016-10-06 NOTE — Telephone Encounter (Signed)
PLEASE CALL PT. HIS LIVER ENZYMES AND LIVER FUNCTION CONTINUE TO IMPROVE.

## 2016-10-08 NOTE — Telephone Encounter (Signed)
Unable to reach pt by phone. Mailed the print out out of results.

## 2016-10-10 ENCOUNTER — Telehealth: Payer: Self-pay | Admitting: Gastroenterology

## 2016-10-10 NOTE — Telephone Encounter (Signed)
Pt said Dr. Oneida Alar had discussed the possibility of him coming off of Xifaxan and he would like to know when he can do so.

## 2016-10-10 NOTE — Telephone Encounter (Signed)
Pt is aware.  

## 2016-10-10 NOTE — Telephone Encounter (Signed)
Pt has a question about his xifaxin. Please call [phone]

## 2016-10-10 NOTE — Telephone Encounter (Signed)
PLEASE CALL PT. HE CAN TAKE XIFAFAXN ONE DAILY FOR 7 DAYS THEN STOP.

## 2016-11-09 ENCOUNTER — Other Ambulatory Visit: Payer: Self-pay | Admitting: Gastroenterology

## 2016-11-22 ENCOUNTER — Telehealth: Payer: Self-pay | Admitting: Gastroenterology

## 2016-11-22 NOTE — Telephone Encounter (Signed)
Letter mailed to pt.  

## 2016-11-22 NOTE — Telephone Encounter (Signed)
RECALL FOR RUQ ULTRASOUND  °

## 2016-12-12 ENCOUNTER — Other Ambulatory Visit: Payer: Self-pay

## 2016-12-12 ENCOUNTER — Telehealth: Payer: Self-pay

## 2016-12-12 DIAGNOSIS — K7030 Alcoholic cirrhosis of liver without ascites: Secondary | ICD-10-CM

## 2016-12-12 NOTE — Telephone Encounter (Signed)
No PA needed for [place] per [facility].

## 2016-12-12 NOTE — Telephone Encounter (Signed)
Pt called office. He received recall letter for [place] abd ruq. [place] scheduled for 01/02/17 at 10:30am, pt to arrive at 10:15am. NPO after midnight. Called pt back and informed him of appt. Letter also mailed.

## 2016-12-17 ENCOUNTER — Telehealth: Payer: Self-pay | Admitting: Gastroenterology

## 2016-12-17 NOTE — Telephone Encounter (Signed)
[phone]  PATIENT IS ON NADALOL AND HIS INSURANCE COMPANY WANTS HIM TO USE [facility] AND NEEDS A 3 MONTH SUPPLY, WHERE IT WAS SENT BEFORE WAS GOING TO CHARGE A LOT MORE WHEN THEY WENT IN FOR A REFILL   PLEASE CALL WIFE GWYNN AT THE ABOVE NUMBER

## 2016-12-17 NOTE — Telephone Encounter (Signed)
I spoke to [facility] and I have changed pharmacies to [facility] [place] as requested. Pt needs 3 month supply of Nadalol, Torsemide and Pantoprazole . Forwarding to refill box.

## 2016-12-18 MED ORDER — PANTOPRAZOLE SODIUM 40 MG PO TBEC: 40.0000 mg | DELAYED_RELEASE_TABLET | Freq: Every day | ORAL | 11 refills | Status: DC

## 2016-12-18 MED ORDER — NADOLOL 20 MG PO TABS: 20.0000 mg | ORAL_TABLET | Freq: Two times a day (BID) | ORAL | 11 refills | Status: DC

## 2016-12-18 MED ORDER — TORSEMIDE 20 MG PO TABS: 20.0000 mg | ORAL_TABLET | Freq: Every day | ORAL | 5 refills | Status: DC

## 2016-12-18 NOTE — Telephone Encounter (Signed)
Completed.

## 2016-12-18 NOTE — Addendum Note (Signed)
Addended by: Annitta Needs on: 12/18/2016 12:34 PM   Modules accepted: Orders

## 2016-12-24 ENCOUNTER — Telehealth: Payer: Self-pay

## 2016-12-24 ENCOUNTER — Telehealth: Payer: Self-pay | Admitting: Gastroenterology

## 2016-12-24 NOTE — Telephone Encounter (Signed)
Pt's wife called to say that the 3 refills the patient was needing needs to say 90 day supply. I told her the nurse wasn't available and transferred call to VM.

## 2016-12-24 NOTE — Telephone Encounter (Signed)
See Separate note.

## 2016-12-24 NOTE — Telephone Encounter (Signed)
Gwen called to have pt's refills changed from 30 day supply to 90 day supply. The following were given by Roseanne Kaufman, NP on 12/18/2016, Protonix, Nadalol and Demadex. I [facility] [place] [facility] and told her to give 90 day supply of the Protonix, Nadalol and Demadex. With 3 refills on the Protonix and the Nadalol and 1 refill on the Demadex.  Forwarding FYI to Roseanne Kaufman, NP.

## 2017-01-02 ENCOUNTER — Ambulatory Visit (HOSPITAL_COMMUNITY)
Admission: RE | Admit: 2017-01-02 | Discharge: 2017-01-02 | Disposition: A | Discharge status: Home / Self Care | Payer: Managed Care, Other (non HMO) | Source: Ambulatory Visit | Attending: Gastroenterology | Admitting: Gastroenterology

## 2017-01-02 DIAGNOSIS — K8020 Calculus of gallbladder without cholecystitis without obstruction: Secondary | ICD-10-CM | POA: Insufficient documentation

## 2017-01-02 DIAGNOSIS — Z1389 Encounter for screening for other disorder: Secondary | ICD-10-CM | POA: Insufficient documentation

## 2017-01-02 DIAGNOSIS — K7030 Alcoholic cirrhosis of liver without ascites: Secondary | ICD-10-CM | POA: Insufficient documentation

## 2017-01-03 ENCOUNTER — Other Ambulatory Visit (HOSPITAL_COMMUNITY): Payer: Managed Care, Other (non HMO)

## 2017-01-06 NOTE — Progress Notes (Signed)
No evidence of HCC. Repeat ruq u/s in 6 months.  Patient recently seen at liver transplant center and they are still requesting he undergo formal etoh replapse prevention program locally. Please help patient find local resource.

## 2017-01-08 NOTE — Progress Notes (Signed)
Pt is aware of results and plan . Forwarding to [facility] to assist with the relapse prevention program.  Forwarding to [place] to nic [place] in 6 months.

## 2017-01-08 NOTE — Progress Notes (Signed)
ON RECALL  °

## 2017-01-09 ENCOUNTER — Other Ambulatory Visit: Payer: Self-pay

## 2017-01-09 NOTE — Progress Notes (Signed)
Noted  

## 2017-02-06 ENCOUNTER — Encounter: Payer: Self-pay | Admitting: Gastroenterology

## 2017-04-24 ENCOUNTER — Ambulatory Visit: Payer: Managed Care, Other (non HMO) | Admitting: Gastroenterology

## 2017-05-17 IMAGING — CR DG CHEST 1V PORT
2 series · 2 of 2 positions shown · non-contrast
Comparison: January 03, 2016

CLINICAL DATA: Central catheter placement

EXAM:
PORTABLE CHEST 1 VIEW

[ap (1 of 2)]
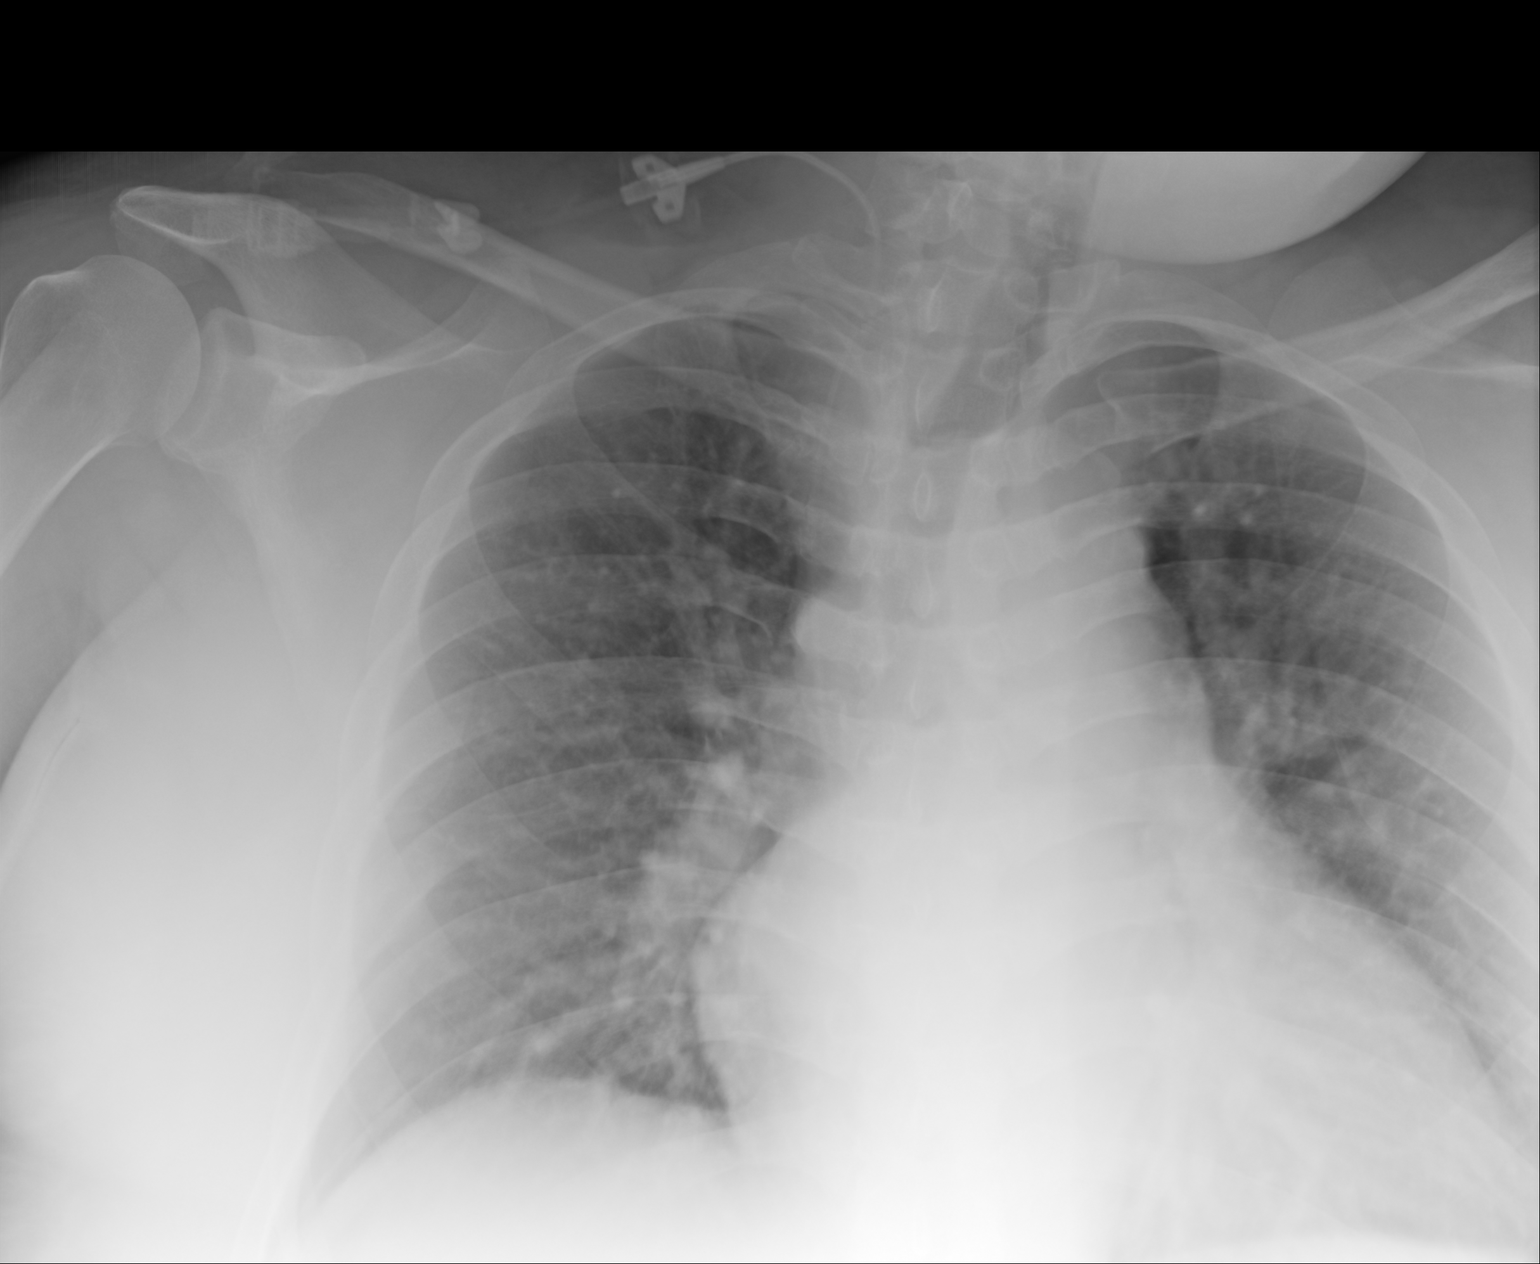

[ap (2 of 2)]
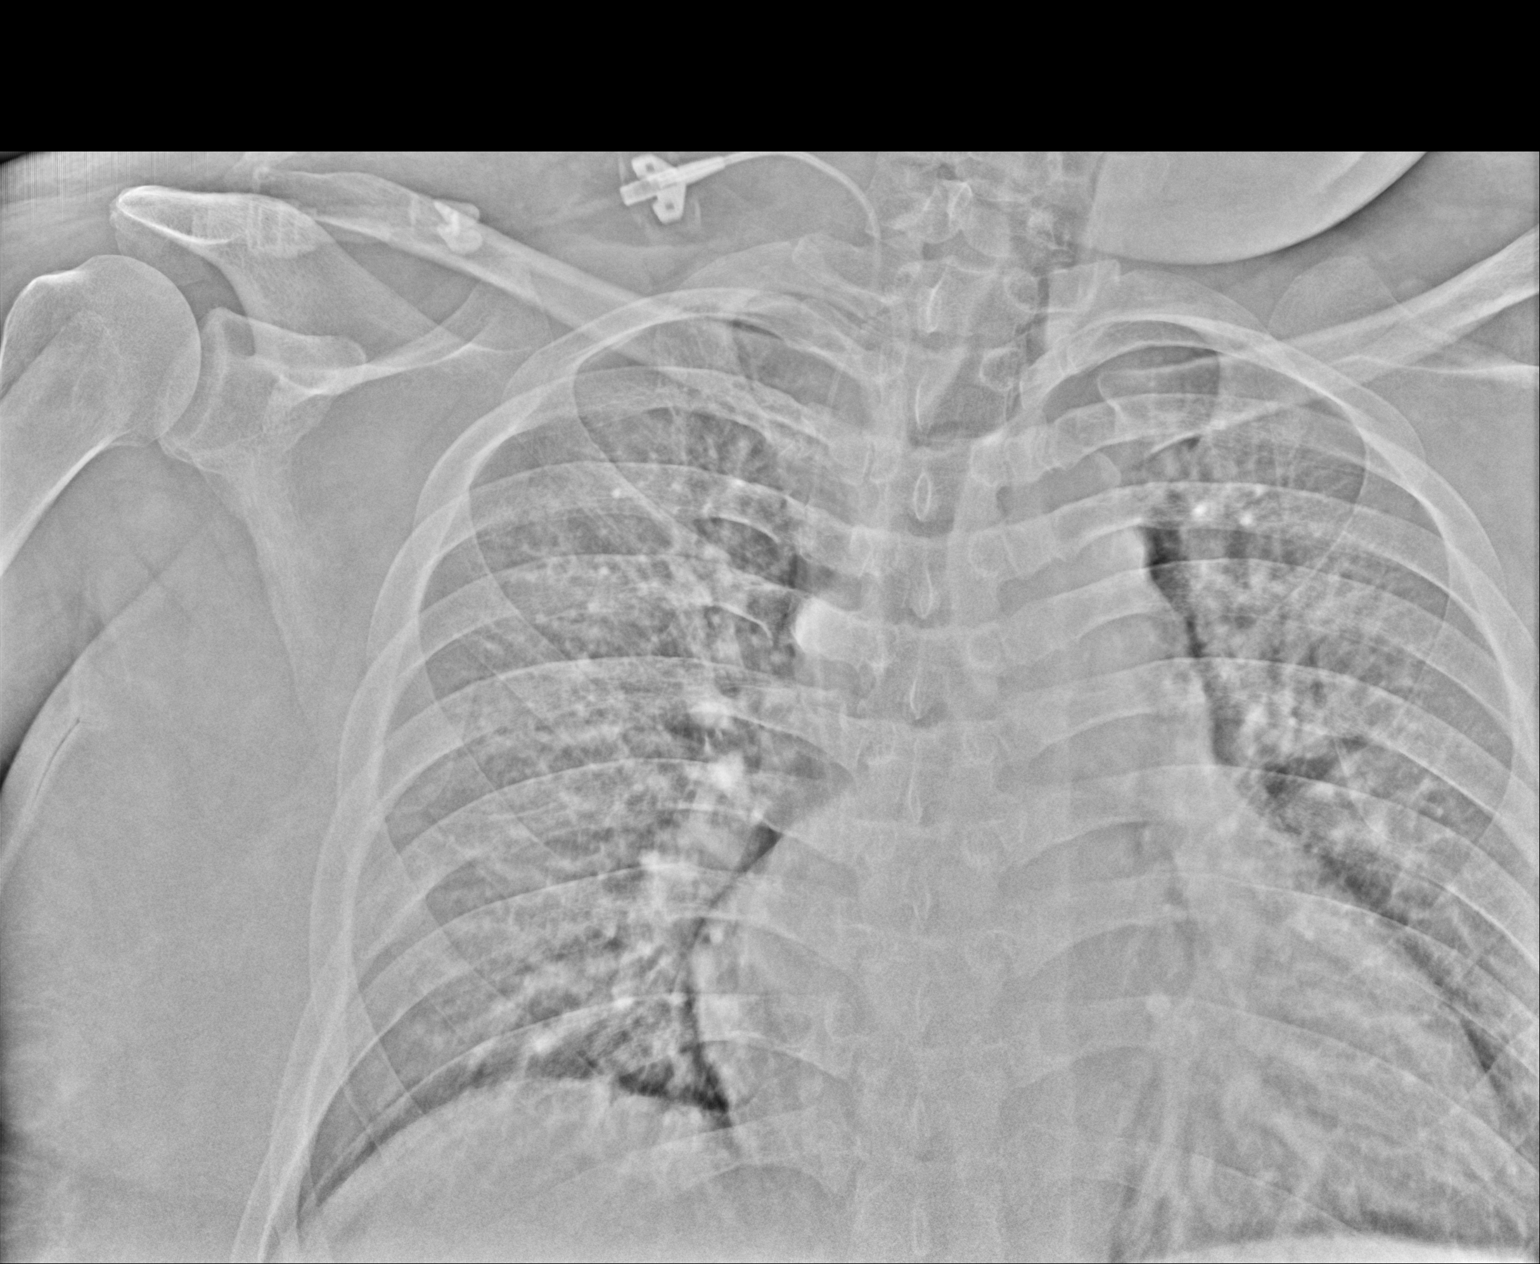

[2 of 2 positions shown; findings below may reference images not displayed]

FINDINGS: Central catheter tip is in the superior vena cava. Pneumothorax. A
small portion of the lateral left base is not visualized. The
visualized lungs show no edema or consolidation. Heart is mildly
enlarged with pulmonary vascularity within normal limits. No
adenopathy. No bone lesions.
IMPRESSION: Visualized lung regions are clear. No pneumothorax. Stable
cardiomegaly. Central catheter tip in superior vena cava.

## 2017-05-30 ENCOUNTER — Encounter: Payer: Self-pay | Admitting: *Deleted

## 2017-05-30 ENCOUNTER — Telehealth: Payer: Self-pay | Admitting: Gastroenterology

## 2017-05-30 NOTE — Telephone Encounter (Signed)
Letter mailed to pt for recall

## 2017-05-30 NOTE — Telephone Encounter (Signed)
RECALL FOR ULTRASOUND 

## 2017-06-13 ENCOUNTER — Encounter: Payer: Self-pay | Admitting: *Deleted

## 2017-06-13 ENCOUNTER — Ambulatory Visit: Payer: Managed Care, Other (non HMO) | Admitting: Gastroenterology

## 2017-06-13 ENCOUNTER — Encounter: Payer: Self-pay | Admitting: Gastroenterology

## 2017-06-13 DIAGNOSIS — K625 Hemorrhage of anus and rectum: Secondary | ICD-10-CM

## 2017-06-13 DIAGNOSIS — K7290 Hepatic failure, unspecified without coma: Secondary | ICD-10-CM

## 2017-06-13 DIAGNOSIS — F1010 Alcohol abuse, uncomplicated: Secondary | ICD-10-CM | POA: Diagnosis not present

## 2017-06-13 DIAGNOSIS — D638 Anemia in other chronic diseases classified elsewhere: Secondary | ICD-10-CM | POA: Diagnosis not present

## 2017-06-13 DIAGNOSIS — K7030 Alcoholic cirrhosis of liver without ascites: Secondary | ICD-10-CM

## 2017-06-13 DIAGNOSIS — I8510 Secondary esophageal varices without bleeding: Secondary | ICD-10-CM | POA: Diagnosis not present

## 2017-06-13 DIAGNOSIS — K7682 Hepatic encephalopathy: Secondary | ICD-10-CM

## 2017-06-13 NOTE — Assessment & Plan Note (Addendum)
GARDE II AUG 2017. NO ETOH. HR 78, SBP 111.  ON NADOLOL. NO SIDE EFFECTS.

## 2017-06-13 NOTE — Assessment & Plan Note (Signed)
Last CBC Hb 9.3 nlMCV. NO BRBPR OR MELENA.  Check cbc today.

## 2017-06-13 NOTE — Assessment & Plan Note (Signed)
SYMPTOMS CONTROLLED/RESOLVED.  CONTINUE TO MONITOR SYMPTOMS. 

## 2017-06-13 NOTE — Progress Notes (Signed)
CC'D TO PCP °

## 2017-06-13 NOTE — Patient Instructions (Addendum)
You look great!   CONTINUE YOUR WEIGHT LOSS EFFORTS.  Complere labs and ultrasound. LABS DRAWN FOR LIVER FUNCTION, BLOOD COUNT, IRON STORES, AND VITAMIN LEVELS(B12/FOLATE)   CONTINUE DEMADEX IF NEEDED.  CONTINUE PROTONIX. TAKE 30 MINUTES PRIOR TO MEALS ONCE DAILY EVERY DAY AND TWICE DAILY.  USE LACTULOSE TO PREVENT CONSTIPATION.   FOLLOW UP IN 6 MOS.

## 2017-06-13 NOTE — Progress Notes (Signed)
Subjective:    Patient ID: Antonio [name], male    DOB: Jan 21, 1971, 47 y.o.   MRN: [id number]  Deloria Lair., MD  HPI Been doing good. Starting get back in the gym. No salt or Etoh.BMs: good, ONLY TAKES LACTULOSE PRN FOR CONSTIPATION(STRAINING TO PASS STOOLS).  1/2 TORSEMIDE QOD MOST OF THE TIME.  HEARTBURN THE OTHER DAY BUT CONTROLLED FOR THE MOST PART.  PT DENIES FEVER, CHILLS, HEMATOCHEZIA, HEMATEMESIS, nausea, vomiting, melena, diarrhea, CHEST PAIN, SHORTNESS OF BREATH, CHANGE IN BOWEL IN HABITS, abdominal pain, OR problems swallowing.  Past Medical History:  Diagnosis Date  . Alcohol abuse   . Alcoholic hepatitis   . Cirrhosis with alcoholism [facility])    Past Surgical History:  Procedure Laterality Date  . BIOPSY  03/08/2016   Procedure: BIOPSY;  Surgeon: Daneil Dolin, MD;  [facility];  Service: Endoscopy;;  descending colon biopsies  . Colonoscopy     per patient around 2014 in [place]   . COLONOSCOPY WITH PROPOFOL [name] 03/08/2016   Procedure: COLONOSCOPY WITH PROPOFOL;  Surgeon: Daneil Dolin, MD;  [facility];  Service: Endoscopy;  Laterality: [name];  . ESOPHAGOGASTRODUODENOSCOPY (EGD) WITH PROPOFOL [name] 12/28/2015   Dr. Oneida Alar: 4 columns of esophageal varices, 3 grade 1, 1 grade 2 with no evidence of bleeding. Mild portal gastropathy with no evidence of active bleeding.  . ESOPHAGOGASTRODUODENOSCOPY (EGD) WITH PROPOFOL [name] 03/08/2016   Procedure: ESOPHAGOGASTRODUODENOSCOPY (EGD) WITH PROPOFOL;  Surgeon: Daneil Dolin, MD;  [facility];  Service: Endoscopy;  Laterality: [name];  . POLYPECTOMY  03/08/2016   Procedure: POLYPECTOMY;  Surgeon: Daneil Dolin, MD;  [facility];  Service: Endoscopy;;  cecum and descending    No Known Allergies  Current Outpatient Medications  Medication Sig Dispense Refill  . Calcium Carb-Cholecalciferol (CALCIUM 600+D) 600-800 MG-UNIT TABS Take 1 tablet by mouth 2 (two) times daily.     Marland Kitchen lactulose  (CHRONULAC) 10 GM/15ML solution Take 30 mLs (20 g total) by mouth 3 (three) times daily. (Patient taking differently: Take 20 g by mouth as needed. )    . nadolol (CORGARD) 20 MG tablet Take 1 tablet (20 mg total) by mouth 2 (two) times daily.    . pantoprazole (PROTONIX) 40 MG tablet Take 1 tablet (40 mg total) by mouth daily before breakfast.    . torsemide (DEMADEX) 20 MG tablet Take 1 tablet (20 mg total) by mouth daily. PT takes 1/2 tablet when he has extra swelling.    .       Review of Systems PER HPI OTHERWISE ALL SYSTEMS ARE NEGATIVE.    Objective:   Physical Exam  Constitutional: He is oriented to person, place, and time. He appears well-developed and well-nourished. No distress.  HENT:  Head: Normocephalic and atraumatic.  Mouth/Throat: Oropharynx is clear and moist. No oropharyngeal exudate.  Eyes: Pupils are equal, round, and reactive to light. No scleral icterus.  Neck: Normal range of motion. Neck supple.  Cardiovascular: Normal rate, regular rhythm and normal heart sounds.  Pulmonary/Chest: Effort normal and breath sounds normal. No respiratory distress.  Abdominal: Soft. Bowel sounds are normal. He exhibits no distension. There is no tenderness.  Musculoskeletal: He exhibits no edema.  Lymphadenopathy:    He has no cervical adenopathy.  Neurological: He is alert and oriented to person, place, and time.  NO  NEW FOCAL DEFICITS  Psychiatric: He has a normal mood and affect.  Vitals reviewed.  Assessment & Plan:

## 2017-06-13 NOTE — Assessment & Plan Note (Signed)
PT IS ABSTINENT.

## 2017-06-13 NOTE — Progress Notes (Signed)
ON RECALL  °

## 2017-06-13 NOTE — Assessment & Plan Note (Signed)
WELL COMPENSATED DISEASE.   COMPLETE LABS AND ULTRASOUND. FOLLOW UP IN 6 MOS.

## 2017-06-13 NOTE — Addendum Note (Signed)
Addended by: Danie Binder on: 06/13/2017 10:49 AM   Modules accepted: Level of Service

## 2017-06-13 NOTE — Assessment & Plan Note (Signed)
NO WARNING SIGNS/SYMPTOMS  CONTINUE TO MONITOR SYMPTOMS. 

## 2017-06-19 ENCOUNTER — Ambulatory Visit (HOSPITAL_COMMUNITY): Admission: RE | Admit: 2017-06-19 | Payer: Managed Care, Other (non HMO) | Source: Ambulatory Visit

## 2017-06-19 LAB — COMPLETE METABOLIC PANEL WITH GFR
AG Ratio: 0.6 (calc) — ABNORMAL LOW (ref 1.0–2.5)
ALBUMIN MSPROF: 2.6 g/dL — AB (ref 3.6–5.1)
ALKALINE PHOSPHATASE (APISO): 221 U/L — AB (ref 40–115)
ALT: 18 U/L (ref 9–46)
AST: 49 U/L — ABNORMAL HIGH (ref 10–40)
BUN: 16 mg/dL (ref 7–25)
CO2: 29 mmol/L (ref 20–32)
CREATININE: 0.96 mg/dL (ref 0.60–1.35)
Calcium: 8.5 mg/dL — ABNORMAL LOW (ref 8.6–10.3)
Chloride: 101 mmol/L (ref 98–110)
GFR, EST AFRICAN AMERICAN: 110 mL/min/{1.73_m2} (ref >=60)
GFR, Est Non African American: 95 mL/min/{1.73_m2} (ref >=60)
GLUCOSE: 79 mg/dL (ref 65–139)
Globulin: 4.1 g/dL (calc) — ABNORMAL HIGH (ref 1.9–3.7)
Potassium: 3.5 mmol/L (ref 3.5–5.3)
Sodium: 136 mmol/L (ref 135–146)
TOTAL PROTEIN: 6.7 g/dL (ref 6.1–8.1)
Total Bilirubin: 2.6 mg/dL — ABNORMAL HIGH (ref 0.2–1.2)

## 2017-06-19 LAB — PROTIME-INR
INR: 1.3 — AB
Prothrombin Time: 13.9 s — ABNORMAL HIGH (ref 9.0–11.5)

## 2017-06-19 LAB — CBC WITH DIFFERENTIAL/PLATELET
Basophils Absolute: 78 cells/uL (ref 0–200)
Basophils Relative: 1.2 %
EOS ABS: 241 {cells}/uL (ref 15–500)
Eosinophils Relative: 3.7 %
HCT: 28.1 % — ABNORMAL LOW (ref 38.5–50.0)
HEMOGLOBIN: 8.9 g/dL — AB (ref 13.2–17.1)
Lymphs Abs: 1944 cells/uL (ref 850–3900)
MCH: 23.6 pg — AB (ref 27.0–33.0)
MCHC: 31.7 g/dL — ABNORMAL LOW (ref 32.0–36.0)
MCV: 74.5 fL — ABNORMAL LOW (ref 80.0–100.0)
MONOS PCT: 16.4 %
NEUTROS ABS: 3172 {cells}/uL (ref 1500–7800)
Neutrophils Relative %: 48.8 %
PLATELETS: 182 10*3/uL (ref 140–400)
RBC: 3.77 10*6/uL — ABNORMAL LOW (ref 4.20–5.80)
RDW: 17 % — ABNORMAL HIGH (ref 11.0–15.0)
Total Lymphocyte: 29.9 %
WBC mixed population: 1066 cells/uL — ABNORMAL HIGH (ref 200–950)
WBC: 6.5 10*3/uL (ref 3.8–10.8)

## 2017-06-19 LAB — FOLATE RBC: RBC FOLATE: 919 ng/mL (ref >280)

## 2017-06-19 LAB — FERRITIN: Ferritin: 8 ng/mL — ABNORMAL LOW (ref 20–380)

## 2017-06-19 LAB — VITAMIN B12: VITAMIN B 12: 1811 pg/mL — AB (ref 200–1100)

## 2017-06-19 NOTE — Progress Notes (Signed)
CC'D TO PCP °

## 2017-06-19 NOTE — Progress Notes (Signed)
PT is aware.

## 2017-06-21 ENCOUNTER — Ambulatory Visit (HOSPITAL_COMMUNITY)
Admission: RE | Admit: 2017-06-21 | Discharge: 2017-06-21 | Disposition: A | Discharge status: Home / Self Care | Payer: Managed Care, Other (non HMO) | Source: Ambulatory Visit | Attending: Gastroenterology | Admitting: Gastroenterology

## 2017-06-21 DIAGNOSIS — K8020 Calculus of gallbladder without cholecystitis without obstruction: Secondary | ICD-10-CM | POA: Diagnosis not present

## 2017-06-21 DIAGNOSIS — R7989 Other specified abnormal findings of blood chemistry: Secondary | ICD-10-CM | POA: Diagnosis present

## 2017-06-21 DIAGNOSIS — K7030 Alcoholic cirrhosis of liver without ascites: Secondary | ICD-10-CM | POA: Diagnosis present

## 2017-06-24 ENCOUNTER — Telehealth: Payer: Self-pay

## 2017-06-24 ENCOUNTER — Other Ambulatory Visit: Payer: Self-pay | Admitting: *Deleted

## 2017-06-24 DIAGNOSIS — K8020 Calculus of gallbladder without cholecystitis without obstruction: Secondary | ICD-10-CM

## 2017-06-24 NOTE — Telephone Encounter (Signed)
Pt called back and said he would like to be referred to a surgeon in [place] for advice on having his Gall bladder removed. Forwarding to [facility]. ( Please see [place] results).

## 2017-06-24 NOTE — Telephone Encounter (Signed)
Referral has been faxed to [place] [facility]

## 2017-06-24 NOTE — Progress Notes (Signed)
Pt is aware and said he is not having any problems at this time. He will think about it and let Dr. Oneida Alar know when he decides.

## 2017-06-25 ENCOUNTER — Telehealth: Payer: Self-pay | Admitting: Gastroenterology

## 2017-06-25 ENCOUNTER — Other Ambulatory Visit: Payer: Self-pay

## 2017-06-25 NOTE — Telephone Encounter (Signed)
[facility] and cancelled referral that was faxed this morning. Referral sent to Dr. Arnoldo Morale via [place].

## 2017-06-25 NOTE — Telephone Encounter (Signed)
Pt called to say that he wants a Psychologist, sport and exercise in [place] instead of [place]. [place]

## 2017-06-27 NOTE — Progress Notes (Signed)
LMOM to call.

## 2017-06-28 NOTE — Progress Notes (Signed)
Pt called and is aware of results. Ok to schedule [facility].

## 2017-07-01 ENCOUNTER — Encounter: Payer: Self-pay | Admitting: *Deleted

## 2017-07-01 ENCOUNTER — Other Ambulatory Visit: Payer: Self-pay | Admitting: *Deleted

## 2017-07-01 DIAGNOSIS — D638 Anemia in other chronic diseases classified elsewhere: Secondary | ICD-10-CM

## 2017-07-05 ENCOUNTER — Ambulatory Visit (HOSPITAL_COMMUNITY)
Admission: RE | Admit: 2017-07-05 | Discharge: 2017-07-05 | Disposition: A | Discharge status: Home / Self Care | Payer: Managed Care, Other (non HMO) | Source: Ambulatory Visit | Attending: Gastroenterology | Admitting: Gastroenterology

## 2017-07-05 ENCOUNTER — Encounter (HOSPITAL_COMMUNITY)
Admission: RE | Disposition: A | Discharge status: Home / Self Care | Payer: Self-pay | Source: Ambulatory Visit | Attending: Gastroenterology

## 2017-07-05 ENCOUNTER — Encounter (HOSPITAL_COMMUNITY): Payer: Self-pay | Admitting: *Deleted

## 2017-07-05 DIAGNOSIS — K7460 Unspecified cirrhosis of liver: Secondary | ICD-10-CM | POA: Insufficient documentation

## 2017-07-05 DIAGNOSIS — K6389 Other specified diseases of intestine: Secondary | ICD-10-CM | POA: Diagnosis not present

## 2017-07-05 DIAGNOSIS — D500 Iron deficiency anemia secondary to blood loss (chronic): Secondary | ICD-10-CM | POA: Diagnosis present

## 2017-07-05 HISTORY — PX: GIVENS CAPSULE STUDY: SHX5432

## 2017-07-05 SURGERY — IMAGING PROCEDURE, GI TRACT, INTRALUMINAL, VIA CAPSULE

## 2017-07-05 NOTE — OR Nursing (Signed)
Patient brought [facility] back at 1045 and stated the blue light had stopped flashing and there was a green light on. When patient arrived, the blue light was blinking appropriately but when I tried to view the image, there was a black exclamation point on the screen. Called [facility] technical support and was informed the study had to be downloaded as it was stopped at some point and nothing could be done to make it start recording again. Downloaded study and there is 2.5 hours worth of video. Dr. Oneida Alar notified of the above.

## 2017-07-09 ENCOUNTER — Encounter (HOSPITAL_COMMUNITY): Payer: Self-pay | Admitting: Gastroenterology

## 2017-07-11 ENCOUNTER — Ambulatory Visit: Payer: Managed Care, Other (non HMO) | Admitting: General Surgery

## 2017-07-11 ENCOUNTER — Encounter: Payer: Self-pay | Admitting: General Surgery

## 2017-07-11 VITALS — BP 129/73 | HR 68 | Temp 98.7°F | Ht 74.0 in | Wt 315.0 lb

## 2017-07-11 DIAGNOSIS — K8020 Calculus of gallbladder without cholecystitis without obstruction: Secondary | ICD-10-CM | POA: Diagnosis not present

## 2017-07-11 NOTE — Progress Notes (Signed)
Antonio [name]; [id number]; 09-Dec-1970   HPI Patient is a 47 year old white male who was referred to my care by Dr. Oneida Alar for evaluation and treatment of cholelithiasis.  Patient has a long-standing history of cirrhosis secondary to alcoholism, though he has not abused alcohol for some time now.  He states the stones were found on ultrasound of the gallbladder.  He does have evidence of cirrhosis by ultrasound.  He denies any fever, chills, jaundice, abdominal pain, fatty food intolerance.  He has 0 out of 10 abdominal pain.  He states he feels better than he has in some time. Past Medical History:  Diagnosis Date  . Alcohol abuse   . Alcoholic hepatitis   . Cirrhosis with alcoholism [facility])     Past Surgical History:  Procedure Laterality Date  . BIOPSY  03/08/2016   Procedure: BIOPSY;  Surgeon: Daneil Dolin, MD;  [facility];  Service: Endoscopy;;  descending colon biopsies  . Colonoscopy     per patient around 2014 in [place]   . COLONOSCOPY WITH PROPOFOL [name] 03/08/2016   Procedure: COLONOSCOPY WITH PROPOFOL;  Surgeon: Daneil Dolin, MD;  [facility];  Service: Endoscopy;  Laterality: [name];  . ESOPHAGOGASTRODUODENOSCOPY (EGD) WITH PROPOFOL [name] 12/28/2015   Dr. Oneida Alar: 4 columns of esophageal varices, 3 grade 1, 1 grade 2 with no evidence of bleeding. Mild portal gastropathy with no evidence of active bleeding.  . ESOPHAGOGASTRODUODENOSCOPY (EGD) WITH PROPOFOL [name] 03/08/2016   Procedure: ESOPHAGOGASTRODUODENOSCOPY (EGD) WITH PROPOFOL;  Surgeon: Daneil Dolin, MD;  [facility];  Service: Endoscopy;  Laterality: [name];  . GIVENS CAPSULE STUDY [name] 07/05/2017   Procedure: GIVENS CAPSULE STUDY;  Surgeon: Danie Binder, MD;  [facility];  Service: Endoscopy;  Laterality: [name];  7:30am  . POLYPECTOMY  03/08/2016   Procedure: POLYPECTOMY;  Surgeon: Daneil Dolin, MD;  [facility];  Service: Endoscopy;;  cecum and descending     Family History   Problem Relation Age of Onset  . Colon cancer Neg Hx   . Liver disease Neg Hx     Current Outpatient Medications on File Prior to Visit  Medication Sig Dispense Refill  . Calcium Carb-Cholecalciferol (CALCIUM 600+D) 600-800 MG-UNIT TABS Take 1 tablet by mouth 2 (two) times daily.     Marland Kitchen lactulose (CHRONULAC) 10 GM/15ML solution Take 30 mLs (20 g total) by mouth 3 (three) times daily. (Patient taking differently: Take 20 g by mouth as needed. ) 2700 mL 11  . nadolol (CORGARD) 20 MG tablet Take 1 tablet (20 mg total) by mouth 2 (two) times daily. 60 tablet 11  . pantoprazole (PROTONIX) 40 MG tablet Take 1 tablet (40 mg total) by mouth daily before breakfast. 30 tablet 11  . rifaximin (XIFAXAN) 550 MG TABS tablet Take 1 tablet (550 mg total) by mouth 2 (two) times daily. (Patient not taking: Reported on 06/13/2017) 60 tablet 11  . torsemide (DEMADEX) 20 MG tablet Take 1 tablet (20 mg total) by mouth daily. PT takes 1/2 tablet when he has extra swelling. 30 tablet 5   No current facility-administered medications on file prior to visit.     No Known Allergies  Social History   Substance and Sexual Activity  Alcohol Use No   Comment: former--quit after 12/2015 hospitalization    Social History   Tobacco Use  Smoking Status Former Smoker  . Packs/day: 0.25  . Types: Cigarettes  . Last attempt to quit: 2016  .  Years since quitting: 3.1  Smokeless Tobacco Never Used    Review of Systems  Constitutional: Positive for malaise/fatigue.  HENT: Negative.   Eyes: Negative.   Respiratory: Positive for shortness of breath and wheezing.   Cardiovascular: Negative.   Gastrointestinal: Positive for heartburn.  Genitourinary: Positive for frequency and urgency.  Musculoskeletal: Positive for back pain.  Skin: Positive for rash.  Neurological: Negative.   Endo/Heme/Allergies: Negative.   Psychiatric/Behavioral: Negative.     Objective   Vitals:   07/11/17 1032  BP: 129/73  Pulse: 68   Temp: 98.7 F (37.1 C)    Physical Exam  Constitutional: He is oriented to person, place, and time and well-developed, well-nourished, and in no distress.  HENT:  Head: Normocephalic and atraumatic.  Eyes: No scleral icterus.  Cardiovascular: Normal rate, regular rhythm and normal heart sounds. Exam reveals no gallop and no friction rub.  No murmur heard. Pulmonary/Chest: Effort normal and breath sounds normal. No respiratory distress. He has no wheezes. He has no rales.  Abdominal: Soft. Bowel sounds are normal. He exhibits no distension. There is no tenderness. There is no rebound and no guarding.  Neurological: He is alert and oriented to person, place, and time.  Skin: Skin is warm and dry.  Vitals reviewed.  Dr. Oneida Alar notes reviewed, liver enzyme test results reviewed, ultrasound report reviewed Assessment  Cholelithiasis, currently asymptomatic Alcoholic cirrhosis of the liver Plan   No need for cholecystectomy at this time as the patient is asymptomatic.  He is at increased risk for surgical intervention given his cirrhosis.  I did give him literature about gallstones.  He will return should he develop signs and symptoms of biliary colic.  Follow-up here as needed.

## 2017-07-11 NOTE — Patient Instructions (Signed)
Cholelithiasis °Cholelithiasis is a form of gallbladder disease in which gallstones form in the gallbladder. The gallbladder is an organ that stores bile. Bile is made in the liver, and it helps to digest fats. Gallstones begin as small crystals and slowly grow into stones. They may cause no symptoms until the gallbladder tightens (contracts) and a gallstone is blocking the duct (gallbladder attack), which can cause pain. Cholelithiasis is also referred to as gallstones. °There are two main types of gallstones: °· Cholesterol stones. These are made of hardened cholesterol and are usually yellow-green in color. They are the most common type of gallstone. Cholesterol is a white, waxy, fat-like substance that is made in the liver. °· Pigment stones. These are dark in color and are made of a red-yellow substance that forms when hemoglobin from red blood cells breaks down (bilirubin). ° °What are the causes? °This condition may be caused by an imbalance in the substances that bile is made of. This can happen if the bile: °· Has too much bilirubin. °· Has too much cholesterol. °· Does not have enough bile salts. These salts help the body absorb and digest fats. ° °In some cases, this condition can also be caused by the gallbladder not emptying completely or often enough. °What increases the risk? °The following factors may make you more likely to develop this condition: °· Being male. °· Having multiple pregnancies. Health care providers sometimes advise removing diseased gallbladders before future pregnancies. °· Eating a diet that is heavy in fried foods, fat, and refined carbohydrates, like white bread and white rice. °· Being obese. °· Being older than age 40. °· Prolonged use of medicines that contain male hormones (estrogen). °· Having diabetes mellitus. °· Rapidly losing weight. °· Having a family history of gallstones. °· Being of American Indian or Mexican descent. °· Having an intestinal disease such as  Crohn disease. °· Having metabolic syndrome. °· Having cirrhosis. °· Having severe types of anemia such as sickle cell anemia. ° °What are the signs or symptoms? °In most cases, there are no symptoms. These are known as silent gallstones. If a gallstone blocks the bile ducts, it can cause a gallbladder attack. The main symptom of a gallbladder attack is sudden pain in the upper right abdomen. The pain usually comes at night or after eating a large meal. The pain can last for one or several hours and can spread to the right shoulder or chest. °If the bile duct is blocked for more than a few hours, it can cause infection or inflammation of the gallbladder, liver, or pancreas, which may cause: °· Nausea. °· Vomiting. °· Abdominal pain that lasts for 5 hours or more. °· Fever or chills. °· Yellowing of the skin or the whites of the eyes (jaundice). °· Dark urine. °· Light-colored stools. ° °How is this diagnosed? °This condition may be diagnosed based on: °· A physical exam. °· Your medical history. °· An ultrasound of your gallbladder. °· CT scan. °· MRI. °· Blood tests to check for signs of infection or inflammation. °· A scan of your gallbladder and bile ducts (biliary system) using nonharmful radioactive material and special cameras that can see the radioactive material (cholescintigram). This test checks to see how your gallbladder contracts and whether bile ducts are blocked. °· Inserting a small tube with a camera on the end (endoscope) through your mouth to inspect bile ducts and check for blockages (endoscopic retrograde cholangiopancreatogram). ° °How is this treated? °Treatment for gallstones depends on the   severity of the condition. Silent gallstones do not need treatment. If the gallstones cause a gallbladder attack or other symptoms, treatment may be required. Options for treatment include: °· Surgery to remove the gallbladder (cholecystectomy). This is the most common treatment. °· Medicines to dissolve  gallstones. These are most effective at treating small gallstones. You may need to take medicines for up to 6-12 months. °· Shock wave treatment (extracorporeal biliary lithotripsy). In this treatment, an ultrasound machine sends shock waves to the gallbladder to break gallstones into smaller pieces. These pieces can then be passed into the intestines or be dissolved by medicine. This is rarely used. °· Removing gallstones through endoscopic retrograde cholangiopancreatogram. A small basket can be attached to the endoscope and used to capture and remove gallstones. ° °Follow these instructions at home: °· Take over-the-counter and prescription medicines only as told by your health care provider. °· Maintain a healthy weight and follow a healthy diet. This includes: °? Reducing fatty foods, such as fried food. °? Reducing refined carbohydrates, like white bread and white rice. °? Increasing fiber. Aim for foods like almonds, fruit, and beans. °· Keep all follow-up visits as told by your health care provider. This is important. °Contact a health care provider if: °· You think you have had a gallbladder attack. °· You have been diagnosed with silent gallstones and you develop abdominal pain or indigestion. °Get help right away if: °· You have pain from a gallbladder attack that lasts for more than 2 hours. °· You have abdominal pain that lasts for more than 5 hours. °· You have a fever or chills. °· You have persistent nausea and vomiting. °· You develop jaundice. °· You have dark urine or light-colored stools. °Summary °· Cholelithiasis (also called gallstones) is a form of gallbladder disease in which gallstones form in the gallbladder. °· This condition is caused by an imbalance in the substances that make up bile. This can happen if the bile has too much cholesterol, too much bilirubin, or not enough bile salts. °· You are more likely to develop this condition if you are male, pregnant, using medicines with  estrogen, obese, older than age 40, or have a family history of gallstones. You may also develop gallstones if you have diabetes, an intestinal disease, cirrhosis, or metabolic syndrome. °· Treatment for gallstones depends on the severity of the condition. Silent gallstones do not need treatment. °· If gallstones cause a gallbladder attack or other symptoms, treatment may be needed. The most common treatment is surgery to remove the gallbladder. °This information is not intended to replace advice given to you by your health care provider. Make sure you discuss any questions you have with your health care provider. °Document Released: 05/10/2005 Document Revised: 01/29/2016 Document Reviewed: 01/29/2016 °Elsevier Interactive Patient Education © 2018 [employer] ° °

## 2017-07-25 ENCOUNTER — Telehealth: Payer: Self-pay | Admitting: Gastroenterology

## 2017-07-25 DIAGNOSIS — D638 Anemia in other chronic diseases classified elsewhere: Secondary | ICD-10-CM

## 2017-07-25 NOTE — Telephone Encounter (Signed)
Pt's wife is aware and Ok to reschedule Givens and make referral.

## 2017-07-25 NOTE — Procedures (Signed)
   PATIENT DATA: GASTRIC PASSAGE TIME: 29 m, SB PASSAGE TIME: INCOMPLETE. PT IN A SWEEPSTAKES AND EQUIPMENT INTERFERED WITH RECORDER.  RESULTS: LIMITED views of gastric mucosa due to retained contents.  No ULCERS, masses or AVMs seen. OCCASIONAL ERYTHEMA/EDEMA IN SMALL BOWEL.  No old blood or fresh blood in the stomach, OR small bowel.  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 STUDY INCOMPLETE.  Plan: 1. RETURN FOR COMPLETE GIVENS STUDY 2. REFER TO HEMATOLOGY FOR MANAGEMENT OF CHRONIC ANEMIA 3. OPV IN JUL 2019.

## 2017-07-25 NOTE — Telephone Encounter (Signed)
PLEASE CALL PT. HIS GIVENS IS INCOMPLETE. HE NEEDS TO:  1. RETURN FOR COMPLETE GIVENS STUDY 2. REFER TO HEMATOLOGY FOR MANAGEMENT OF CHRONIC ANEMIA 3. OPV IN JUL 2019 E30 CIRRHOSIS/ANEMIA.

## 2017-07-25 NOTE — Addendum Note (Signed)
Addended by: Inge Rise on: 07/25/2017 04:30 PM   Modules accepted: Orders

## 2017-07-26 ENCOUNTER — Other Ambulatory Visit: Payer: Self-pay | Admitting: *Deleted

## 2017-07-26 DIAGNOSIS — D638 Anemia in other chronic diseases classified elsewhere: Secondary | ICD-10-CM

## 2017-07-26 NOTE — Telephone Encounter (Signed)
Spoke with pt and is scheduled for GIVENS 08/05/17 at 7:30am. Instructions discussed/mailed to patient. Referral placed to hematology.   Checked [employer] website and no PA is required for givens.

## 2017-07-26 NOTE — Telephone Encounter (Signed)
REMINDER IN EPIC °

## 2017-08-02 IMAGING — CR DG CHEST 1V PORT
1 series · 1 of 1 positions shown · non-contrast
Comparison: Yesterday

CLINICAL DATA: Respiratory failure

EXAM:
PORTABLE CHEST 1 VIEW

[portable]
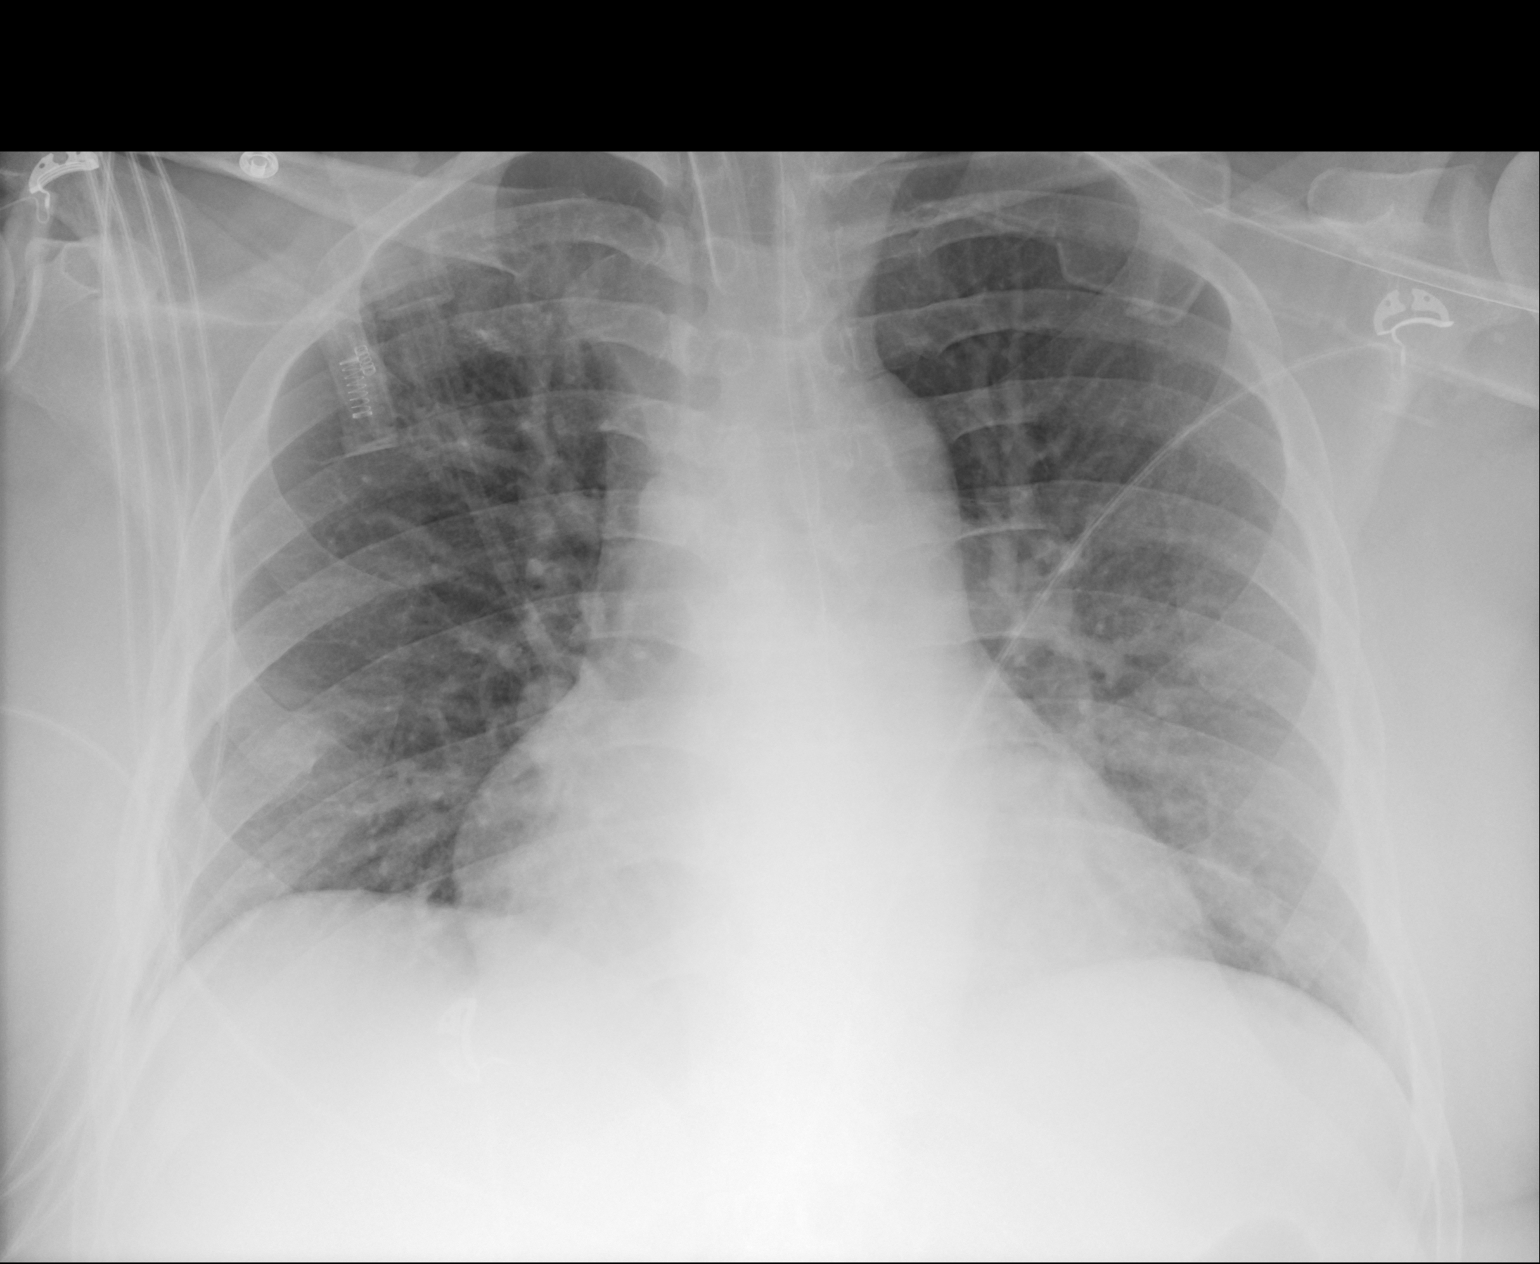

[1 of 1 positions shown; findings below may reference images not displayed]

FINDINGS: Endotracheal tube tip is at the clavicular heads. An orogastric tube
reaches the stomach at least.

Stable mild cardiomegaly. No consolidation, edema, effusion, or
pneumothorax.
IMPRESSION: 1. Stable, unremarkable endotracheal and orogastric tube
positioning.
2. Cardiomegaly without edema or consolidation.

## 2017-08-05 ENCOUNTER — Encounter (HOSPITAL_COMMUNITY): Payer: Self-pay

## 2017-08-05 ENCOUNTER — Ambulatory Visit (HOSPITAL_COMMUNITY)
Admission: RE | Admit: 2017-08-05 | Discharge: 2017-08-05 | Disposition: A | Discharge status: Home / Self Care | Payer: Managed Care, Other (non HMO) | Source: Ambulatory Visit | Attending: Gastroenterology | Admitting: Gastroenterology

## 2017-08-05 ENCOUNTER — Encounter (HOSPITAL_COMMUNITY)
Admission: RE | Disposition: A | Discharge status: Home / Self Care | Payer: Self-pay | Source: Ambulatory Visit | Attending: Gastroenterology

## 2017-08-05 DIAGNOSIS — D500 Iron deficiency anemia secondary to blood loss (chronic): Secondary | ICD-10-CM | POA: Diagnosis not present

## 2017-08-05 HISTORY — PX: GIVENS CAPSULE STUDY: SHX5432

## 2017-08-05 SURGERY — IMAGING PROCEDURE, GI TRACT, INTRALUMINAL, VIA CAPSULE

## 2017-08-06 NOTE — Procedures (Signed)
   PATIENT DATA: GASTRIC PASSAGE TIME: 5 m, SB PASSAGE TIME:3H 53m. NO IMAGES FROM 05:51-2:13.  RESULTS: LIMITED views of gastric mucosa due to retained contents. OCCASIONAL EROSIONS IN GASTRIC MUCOSA. No ULCERS, masses or AVMs seen IN THE SMALL BOWEL.  LIMITED VIEWS OF THE COLON DUE TO RETAINED CONTENTS. No old blood or fresh blood in the stomach, small bowel, or colon.  DIAGNOSIS: ESSENTIALLY NORMAL BUT LIMITED GIVENS STUDY.PRIOR GIVENS SHOWS SB ENTEROPATHY.  Plan: 1. SUPPORTIVE CARE 2. REFER TO HEMATOLOGY TO CONSIDER FOR IVFE. 3. OPV IN 4 MOS

## 2017-08-07 ENCOUNTER — Encounter (HOSPITAL_COMMUNITY): Payer: Self-pay | Admitting: Gastroenterology

## 2017-08-09 ENCOUNTER — Telehealth: Payer: Self-pay | Admitting: Gastroenterology

## 2017-08-09 NOTE — Telephone Encounter (Signed)
PLEASE CALL PT. HIS BLOOD COUNT IS ABNORMAL. HIS IRON STORES ARE LOW. THE GIVENS STUDIES WERE INCOMPLETE TWICE. THE SECOND STUDY HAD NO IMAGES FROM 5 MINS AFTER STUDY INITIATED TO 2 HRS LATER. THE GIVENS STUDY DID NOT IDENTIFY A SOURCE OF IRON/BLOOD LOSSES IN THE SMALL BOWEL.   SINCE THE STUDY IS ESSENTIALLY NORMAL BUT LIMITED GIVENS STUDY. HE SHOULD SEE TO HEMATOLOGY TO CONSIDER FOR IVFE. 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

## 2017-08-12 NOTE — Telephone Encounter (Signed)
LMOM to call.

## 2017-08-12 NOTE — Telephone Encounter (Signed)
ON RECALL  °

## 2017-08-13 NOTE — Telephone Encounter (Signed)
LMOM to call.

## 2017-08-13 NOTE — Telephone Encounter (Signed)
Letter mailed for pt to call for results.  

## 2017-08-14 ENCOUNTER — Ambulatory Visit (HOSPITAL_COMMUNITY): Payer: Managed Care, Other (non HMO) | Admitting: Hematology

## 2017-08-19 ENCOUNTER — Telehealth: Payer: Self-pay

## 2017-08-19 NOTE — Telephone Encounter (Signed)
Pt's wife called for the Freda Munro' results and was informed. She said he already is scheduled with Hematology.

## 2017-08-21 ENCOUNTER — Ambulatory Visit (HOSPITAL_COMMUNITY): Payer: Managed Care, Other (non HMO) | Admitting: Hematology

## 2017-09-02 ENCOUNTER — Other Ambulatory Visit: Payer: Self-pay | Admitting: *Deleted

## 2017-09-02 MED ORDER — LACTULOSE 10 GM/15ML PO SOLN: 20.0000 g | Freq: Three times a day (TID) | ORAL | 5 refills | Status: DC

## 2017-09-10 ENCOUNTER — Encounter (HOSPITAL_COMMUNITY): Payer: Self-pay | Admitting: Hematology

## 2017-09-10 ENCOUNTER — Inpatient Hospital Stay (HOSPITAL_COMMUNITY): Payer: Managed Care, Other (non HMO) | Attending: Hematology | Admitting: Hematology

## 2017-09-10 ENCOUNTER — Inpatient Hospital Stay (HOSPITAL_COMMUNITY): Payer: Managed Care, Other (non HMO)

## 2017-09-10 DIAGNOSIS — K7030 Alcoholic cirrhosis of liver without ascites: Secondary | ICD-10-CM | POA: Insufficient documentation

## 2017-09-10 DIAGNOSIS — D509 Iron deficiency anemia, unspecified: Secondary | ICD-10-CM | POA: Insufficient documentation

## 2017-09-10 DIAGNOSIS — D500 Iron deficiency anemia secondary to blood loss (chronic): Secondary | ICD-10-CM

## 2017-09-10 LAB — VITAMIN B12: Vitamin B-12: 1495 pg/mL — ABNORMAL HIGH (ref 180–914)

## 2017-09-10 LAB — CBC
HEMATOCRIT: 29 % — AB (ref 39.0–52.0)
Hemoglobin: 8.7 g/dL — ABNORMAL LOW (ref 13.0–17.0)
MCH: 23 pg — ABNORMAL LOW (ref 26.0–34.0)
MCHC: 30 g/dL (ref 30.0–36.0)
MCV: 76.5 fL — ABNORMAL LOW (ref 78.0–100.0)
PLATELETS: 178 10*3/uL (ref 150–400)
RBC: 3.79 MIL/uL — AB (ref 4.22–5.81)
RDW: 21 % — AB (ref 11.5–15.5)
WBC: 5.3 10*3/uL (ref 4.0–10.5)

## 2017-09-10 LAB — IRON AND TIBC
IRON: 21 ug/dL — AB (ref 45–182)
SATURATION RATIOS: 6 % — AB (ref 17.9–39.5)
TIBC: 333 ug/dL (ref 250–450)
UIBC: 312 ug/dL

## 2017-09-10 LAB — RETICULOCYTES
RBC.: 3.79 MIL/uL — AB (ref 4.22–5.81)
RETIC COUNT ABSOLUTE: 75.8 10*3/uL (ref 19.0–186.0)
Retic Ct Pct: 2 % (ref 0.4–3.1)

## 2017-09-10 LAB — LACTATE DEHYDROGENASE: LDH: 198 U/L — ABNORMAL HIGH (ref 98–192)

## 2017-09-10 LAB — FOLATE: FOLATE: 11.5 ng/mL (ref >5.9)

## 2017-09-10 NOTE — Patient Instructions (Signed)
[facility]  Discharge Instructions:  You were seen by Dr. Katragadda today.   _______________________________________________________________  Thank you for choosing [facility] at [facility] to provide your oncology and hematology care.  To afford each patient quality time with our providers, please arrive at least 15 minutes before your scheduled appointment.  You need to re-schedule your appointment if you arrive 10 or more minutes late.  We strive to give you quality time with our providers, and arriving late affects you and other patients whose appointments are after yours.  Also, if you no show three or more times for appointments you may be dismissed from the clinic.  Again, thank you for choosing [facility]. Our hope is that these requests will allow you access to exceptional care and in a timely manner. _______________________________________________________________  If you have questions after your visit, please contact our office at [phone] between the hours of 8:30 a.m. and 5:00 p.m. Voicemails left after 4:30 p.m. will not be returned until the following business day. _______________________________________________________________  For prescription refill requests, have your pharmacy contact our office. _______________________________________________________________  Recommendations made by the consultant and any test results will be sent to your referring physician. _______________________________________________________________ 

## 2017-09-10 NOTE — Progress Notes (Signed)
CONSULT NOTE  Patient Care Team: Deloria Lair., MD as PCP - General (Family Medicine)  CHIEF COMPLAINTS/PURPOSE OF CONSULTATION:  Microcytic anemia.  HISTORY OF PRESENTING ILLNESS:  Antonio [name] 47 y.o. male is seen in consultation today for further workup and management of microcytic anemia.  He was found to have an abnormal CBC from January 2019 where his hemoglobin was around 8.9.  MCV was low at 74.  He denies any major bleeding per rectum.  He sees occasional slight blood when he strains.  No melena was reported.  Denies any fevers, night sweats or weight loss in the last 6 months.  Denies any hospitalization in the last 1 year.  He had received up to 4 units of blood transfusion about 2 years ago.  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  A recent capsule study was incomplete twice.  He did not reveal any source of bleed.  Colonoscopy and EGD were done in October 2017 which showed small polyps, rectal varices.  EGD showed grade 2 esophageal varices and portal hypertensive gastropathy.  He is not taking any NSAIDs.  He does not report any pica.  He complains of  feeling tired all the time.  Denies any chest pains or lightheadedness.    MEDICAL HISTORY:  Past Medical History:  Diagnosis Date  . Alcohol abuse   . Alcoholic hepatitis   . 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 BIOPSY  03/08/2016   Procedure: BIOPSY;  Surgeon: Daneil Dolin, MD;  [facility]: [facility];  Service: Endoscopy;;  descending colon biopsies  . Colonoscopy     per patient around 2014 in [place]   . COLONOSCOPY WITH PROPOFOL [name] 03/08/2016   Procedure: COLONOSCOPY WITH PROPOFOL;  Surgeon: Daneil Dolin, MD;  Location: [facility];  Service: Endoscopy;  Laterality: [name];  . ESOPHAGOGASTRODUODENOSCOPY (EGD) WITH PROPOFOL [name] 12/28/2015   Dr. Oneida Alar: 4 columns of esophageal varices, 3 grade 1,  1 grade 2 with no evidence of bleeding. Mild portal gastropathy with no evidence of active bleeding.  . ESOPHAGOGASTRODUODENOSCOPY (EGD) WITH PROPOFOL [name] 03/08/2016   Procedure: ESOPHAGOGASTRODUODENOSCOPY (EGD) WITH PROPOFOL;  Surgeon: Daneil Dolin, MD;  [facility]: [facility];  Service: Endoscopy;  Laterality: [name];  . GIVENS CAPSULE STUDY [name] 07/05/2017   Procedure: GIVENS CAPSULE STUDY;  Surgeon: Danie Binder, MD;  [facility]: [facility];  Service: Endoscopy;  Laterality: [name];  7:30am  . GIVENS CAPSULE STUDY [name] 08/05/2017   Procedure: GIVENS CAPSULE STUDY;  Surgeon: Danie Binder, MD;  [facility];  Service: Endoscopy;  Laterality: [name];  7:30am  . POLYPECTOMY  03/08/2016   Procedure: POLYPECTOMY;  Surgeon: Daneil Dolin, MD;  [facility];  Service: Endoscopy;;  cecum and descending     SOCIAL HISTORY: Social History   Socioeconomic History  . Marital status: Married    Spouse name: Not on file  . Number of children: Not on file  . Years of education: Not on file  . Highest education level: Not on file  Occupational History  . Not on file  Social Needs  . Financial resource strain: Not on file  . Food insecurity:    Worry: Not on file    Inability: Not on file  . Transportation needs:    Medical: Not on file    Non-medical: Not on  file  Tobacco Use  . Smoking status: Former Smoker    Packs/day: 0.25    Types: Cigarettes    Last attempt to quit: 2016    Years since quitting: 3.2  . Smokeless tobacco: Never Used  Substance and Sexual Activity  . Alcohol use: No    Comment: former--quit after 12/2015 hospitalization  . Drug use: No  . Sexual activity: Yes  Lifestyle  . Physical activity:    Days per week: Not on file    Minutes per session: Not on file  . Stress: Not on file  Relationships  . Social connections:    Talks on phone: Not on file    Gets together: Not on file    Attends religious service: Not on file    Active member of  club or organization: Not on file    Attends meetings of clubs or organizations: Not on file    Relationship status: Not on file  . Intimate partner violence:    Fear of current or ex partner: Not on file    Emotionally abused: Not on file    Physically abused: Not on file    Forced sexual activity: Not on file  Other Topics Concern  . Not on file  Social History Narrative  . Not on file    FAMILY HISTORY: Family History  Problem Relation Age of Onset  . Colon cancer Neg Hx   . Liver disease Neg Hx     ALLERGIES:  has No Known Allergies.  MEDICATIONS:  Current Outpatient Medications  Medication Sig Dispense Refill  . Calcium Carb-Cholecalciferol (CALCIUM 600+D) 600-800 MG-UNIT TABS Take 1 tablet by mouth 2 (two) times daily.     Marland Kitchen lactulose (CHRONULAC) 10 GM/15ML solution Take 30 mLs (20 g total) by mouth 3 (three) times daily. 2700 mL 5  . nadolol (CORGARD) 20 MG tablet Take 1 tablet (20 mg total) by mouth 2 (two) times daily. 60 tablet 11  . pantoprazole (PROTONIX) 40 MG tablet Take 1 tablet (40 mg total) by mouth daily before breakfast. 30 tablet 11  . rifaximin (XIFAXAN) 550 MG TABS tablet Take 1 tablet (550 mg total) by mouth 2 (two) times daily. (Patient not taking: Reported on 06/13/2017) 60 tablet 11  . torsemide (DEMADEX) 20 MG tablet Take 1 tablet (20 mg total) by mouth daily. PT takes 1/2 tablet when he has extra swelling. 30 tablet 5   No current facility-administered medications for this visit.     REVIEW OF SYSTEMS:   Constitutional: Denies fevers, chills or abnormal night sweats.  Complains of fatigue. Eyes: Denies blurriness of vision, double vision or watery eyes Ears, nose, mouth, throat, and face: Denies mucositis or sore throat Respiratory: Denies cough, dyspnea or wheezes Cardiovascular: Denies palpitation, chest discomfort or lower extremity swelling.  Occasional dizziness.  Leg swelling present. Gastrointestinal:  Denies nausea, heartburn or change in  bowel habits Skin: Denies abnormal skin rashes Lymphatics: Denies new lymphadenopathy or easy bruising Neurological:Denies numbness, tingling or new weaknesses Behavioral/Psych: Mood is stable, no new changes  All other systems were reviewed with the patient and are negative.  PHYSICAL EXAMINATION: ECOG PERFORMANCE STATUS: 1 - Symptomatic but completely ambulatory  Vitals:   09/10/17 1305  BP: (!) 110/57  Pulse: 63  Resp: 18  Temp: 97.6 F (36.4 C)  SpO2: 95%   Filed Weights   09/10/17 1305  Weight: (!) 329 lb (149.2 kg)    GENERAL:alert, no distress and comfortable SKIN: skin color, texture, turgor  are normal, no rashes or significant lesions EYES: normal, conjunctiva are pink and non-injected, sclera clear OROPHARYNX:no exudate, no erythema and lips, buccal mucosa, and tongue normal  NECK: supple, thyroid normal size, non-tender, without nodularity LYMPH:  no palpable lymphadenopathy in the cervical, axillary or inguinal LUNGS: clear to auscultation and percussion with normal breathing effort HEART: regular rate & rhythm and no murmurs and no lower extremity edema ABDOMEN:abdomen soft, non-tender and normal bowel sounds Musculoskeletal:no cyanosis of digits and no clubbing  PSYCH: alert & oriented x 3 with fluent speech NEURO: no focal motor/sensory deficits  LABORATORY DATA:  I have reviewed the data as listed CBC    Component Value Date/Time   WBC 5.3 09/10/2017 1345   RBC 3.79 (L) 09/10/2017 1345   RBC 3.79 (L) 09/10/2017 1345   HGB 8.7 (L) 09/10/2017 1345   HGB 6.7 (L) 01/10/2016 1730   HCT 29.0 (L) 09/10/2017 1345   PLT 178 09/10/2017 1345   MCV 76.5 (L) 09/10/2017 1345   MCH 23.0 (L) 09/10/2017 1345   MCHC 30.0 09/10/2017 1345   RDW 21.0 (H) 09/10/2017 1345   LYMPHSABS 1,944 06/18/2017 1328   MONOABS 737 09/17/2016 1501   EOSABS 241 06/18/2017 1328   BASOSABS 78 06/18/2017 1328     RADIOGRAPHIC STUDIES: I have personally reviewed the ultrasound of  the abdomen dated 07/02/2016 which shows findings consistent with cirrhosis.  ASSESSMENT & PLAN:  Iron deficiency anemia due to chronic blood loss 1.  Microcytic anemia: - Colonoscopy on 03/09/2016 shows 8 mm tubular adenoma in the descending colon, rectal varices and melanosis coli -EGD on 03/08/2016 shows grade 2 esophageal varices, portal hypertensive gastropathy, capsule study was limited and did not identify a source of bleed -Last hemoglobin in January was around 8.9 with an MCV of 74.  Likely etiology is iron deficiency anemia from chronic blood loss.  Last blood transfusion was 2 years ago.  We will repeat his CBC, ferritin and iron panel to calculate the iron deficit.  I talked to him about initiating him on Feraheme infusions to quickly replete his iron stores.  We talked about the side effects including but not limited to serious allergic reactions.  We will schedule him for 2 weekly infusions.  I will recheck his blood counts in 4-5 weeks to evaluate response.  2.  Alcoholic cirrhosis: -Patient quit drinking alcohol 2 years ago.  He has compensated cirrhosis.  No palpable splenomegaly.  No cytopenias yet.  He is taking nadolol, lactulose and rifaximin.     All questions were answered. The patient knows to call the clinic with any problems, questions or concerns.      Derek Jack, MD 09/10/17 2:02 PM

## 2017-09-10 NOTE — Assessment & Plan Note (Addendum)
1.  Microcytic anemia: - Colonoscopy on 03/09/2016 shows 8 mm tubular adenoma in the descending colon, rectal varices and melanosis coli -EGD on 03/08/2016 shows grade 2 esophageal varices, portal hypertensive gastropathy, capsule study was limited and did not identify a source of bleed -Last hemoglobin in January was around 8.9 with an MCV of 74.  Likely etiology is iron deficiency anemia from chronic blood loss.  Last blood transfusion was 2 years ago.  We will repeat his CBC, ferritin and iron panel to calculate the iron deficit.  I talked to him about initiating him on Feraheme infusions to quickly replete his iron stores.  We talked about the side effects including but not limited to serious allergic reactions.  We will schedule him for 2 weekly infusions.  I will recheck his blood counts in 4-5 weeks to evaluate response.  2.  Alcoholic cirrhosis: -Patient quit drinking alcohol 2 years ago.  He has compensated cirrhosis.  No palpable splenomegaly.  No cytopenias yet.  He is taking nadolol, lactulose and rifaximin.

## 2017-09-11 LAB — PROTEIN ELECTROPHORESIS, SERUM
A/G RATIO SPE: 0.5 — AB (ref 0.7–1.7)
ALPHA-2-GLOBULIN: 0.4 g/dL (ref 0.4–1.0)
Albumin ELP: 2.3 g/dL — ABNORMAL LOW (ref 2.9–4.4)
Alpha-1-Globulin: 0.2 g/dL (ref 0.0–0.4)
BETA GLOBULIN: 1.3 g/dL (ref 0.7–1.3)
GAMMA GLOBULIN: 2.3 g/dL — AB (ref 0.4–1.8)
Globulin, Total: 4.2 g/dL — ABNORMAL HIGH (ref 2.2–3.9)
Total Protein ELP: 6.5 g/dL (ref 6.0–8.5)

## 2017-09-19 ENCOUNTER — Other Ambulatory Visit (HOSPITAL_COMMUNITY): Payer: Self-pay | Admitting: Pharmacist

## 2017-09-19 ENCOUNTER — Other Ambulatory Visit (HOSPITAL_COMMUNITY): Payer: Self-pay | Admitting: Adult Health

## 2017-09-19 ENCOUNTER — Inpatient Hospital Stay (HOSPITAL_COMMUNITY): Payer: Managed Care, Other (non HMO)

## 2017-09-19 ENCOUNTER — Encounter (HOSPITAL_COMMUNITY): Payer: Self-pay

## 2017-09-19 VITALS — BP 124/58 | HR 57 | Temp 98.2°F | Resp 20

## 2017-09-19 DIAGNOSIS — D509 Iron deficiency anemia, unspecified: Secondary | ICD-10-CM | POA: Diagnosis not present

## 2017-09-19 DIAGNOSIS — D500 Iron deficiency anemia secondary to blood loss (chronic): Secondary | ICD-10-CM

## 2017-09-19 MED ORDER — SODIUM CHLORIDE 0.9 % IV SOLN
Freq: Once | INTRAVENOUS | Status: AC
  Administered 2017-09-19: 500 mL via INTRAVENOUS

## 2017-09-19 MED ORDER — FERUMOXYTOL INJECTION 510 MG/17 ML
510.0000 mg | Freq: Once | INTRAVENOUS | Status: AC
  Administered 2017-09-19: 510 mg via INTRAVENOUS
  Filled 2017-09-19: qty 17

## 2017-09-19 NOTE — Patient Instructions (Signed)

## 2017-09-19 NOTE — Progress Notes (Signed)
Patient in for iron infusion.  Written information given for review with all questions asked and answered.  Peripheral IV started with good blood return noted.   Patient tolerated iron infusion with no complaints voiced.  Peripheral IV site clean and dry with no bruising or swelling noted at site.  No complaints of pain at site.  Band aid applied.  VSS with discharge and left ambulatory with no s/s of distress noted.

## 2017-09-26 ENCOUNTER — Inpatient Hospital Stay (HOSPITAL_COMMUNITY): Payer: Managed Care, Other (non HMO) | Attending: Hematology

## 2017-09-26 ENCOUNTER — Encounter (HOSPITAL_COMMUNITY): Payer: Self-pay

## 2017-09-26 VITALS — BP 126/72 | HR 58 | Temp 97.6°F | Resp 18

## 2017-09-26 DIAGNOSIS — K7030 Alcoholic cirrhosis of liver without ascites: Secondary | ICD-10-CM | POA: Diagnosis not present

## 2017-09-26 DIAGNOSIS — D500 Iron deficiency anemia secondary to blood loss (chronic): Secondary | ICD-10-CM | POA: Insufficient documentation

## 2017-09-26 DIAGNOSIS — Z87891 Personal history of nicotine dependence: Secondary | ICD-10-CM | POA: Diagnosis not present

## 2017-09-26 MED ORDER — SODIUM CHLORIDE 0.9% FLUSH
10.0000 mL | Freq: Once | INTRAVENOUS | Status: AC
  Administered 2017-09-26: 10 mL via INTRAVENOUS

## 2017-09-26 MED ORDER — SODIUM CHLORIDE 0.9 % IV SOLN
INTRAVENOUS | Status: DC
  Administered 2017-09-26: 14:00:00 via INTRAVENOUS

## 2017-09-26 MED ORDER — SODIUM CHLORIDE 0.9 % IV SOLN
750.0000 mg | Freq: Once | INTRAVENOUS | Status: AC
  Administered 2017-09-26: 750 mg via INTRAVENOUS
  Filled 2017-09-26: qty 15

## 2017-09-26 NOTE — Progress Notes (Signed)
Patient for iron infusion today.  Patient stated two days after ferahame infusion he had fever and chills.  His temperature was 101F and upper leg swelling that lasted 24 hours.  He used Mucinex for relief of fever.   Denied rash, itching, nausea, headache, or SOB.  Reviewed symptoms with Dr. Delton Coombes and the patient will receive injectafer instead.  Pharmacy notified.   Patient tolerated injectafer with no problems voiced during infusion.  Peripheral IV site clean and dry with no bruising or swelling noted at site.  No complaints of pain with site.

## 2017-09-26 NOTE — Patient Instructions (Signed)

## 2017-10-15 ENCOUNTER — Other Ambulatory Visit (HOSPITAL_COMMUNITY): Payer: Self-pay | Admitting: *Deleted

## 2017-10-15 DIAGNOSIS — D649 Anemia, unspecified: Secondary | ICD-10-CM

## 2017-10-16 ENCOUNTER — Encounter (HOSPITAL_COMMUNITY): Payer: Self-pay | Admitting: Hematology

## 2017-10-16 ENCOUNTER — Inpatient Hospital Stay (HOSPITAL_COMMUNITY): Payer: Managed Care, Other (non HMO)

## 2017-10-16 ENCOUNTER — Other Ambulatory Visit: Payer: Self-pay

## 2017-10-16 ENCOUNTER — Inpatient Hospital Stay (HOSPITAL_BASED_OUTPATIENT_CLINIC_OR_DEPARTMENT_OTHER): Payer: Managed Care, Other (non HMO) | Admitting: Hematology

## 2017-10-16 VITALS — BP 132/82 | HR 69 | Resp 18 | Wt 325.0 lb

## 2017-10-16 DIAGNOSIS — K7030 Alcoholic cirrhosis of liver without ascites: Secondary | ICD-10-CM | POA: Diagnosis not present

## 2017-10-16 DIAGNOSIS — D500 Iron deficiency anemia secondary to blood loss (chronic): Secondary | ICD-10-CM

## 2017-10-16 DIAGNOSIS — D649 Anemia, unspecified: Secondary | ICD-10-CM

## 2017-10-16 DIAGNOSIS — Z87891 Personal history of nicotine dependence: Secondary | ICD-10-CM | POA: Diagnosis not present

## 2017-10-16 LAB — CBC WITH DIFFERENTIAL/PLATELET
Basophils Absolute: 0.1 10*3/uL (ref 0.0–0.1)
Basophils Relative: 1 %
Eosinophils Absolute: 0.2 10*3/uL (ref 0.0–0.7)
Eosinophils Relative: 4 %
HEMATOCRIT: 37.3 % — AB (ref 39.0–52.0)
HEMOGLOBIN: 11.8 g/dL — AB (ref 13.0–17.0)
LYMPHS ABS: 1.7 10*3/uL (ref 0.7–4.0)
Lymphocytes Relative: 30 %
MCH: 26.6 pg (ref 26.0–34.0)
MCHC: 31.6 g/dL (ref 30.0–36.0)
MCV: 84 fL (ref 78.0–100.0)
Monocytes Absolute: 0.6 10*3/uL (ref 0.1–1.0)
Monocytes Relative: 10 %
NEUTROS ABS: 3 10*3/uL (ref 1.7–7.7)
NEUTROS PCT: 55 %
Platelets: 169 10*3/uL (ref 150–400)
RBC: 4.44 MIL/uL (ref 4.22–5.81)
WBC: 5.5 10*3/uL (ref 4.0–10.5)

## 2017-10-16 LAB — COMPREHENSIVE METABOLIC PANEL
ALK PHOS: 207 U/L — AB (ref 38–126)
ALT: 25 U/L (ref 17–63)
AST: 67 U/L — AB (ref 15–41)
Albumin: 2.5 g/dL — ABNORMAL LOW (ref 3.5–5.0)
Anion gap: 6 (ref 5–15)
BILIRUBIN TOTAL: 3 mg/dL — AB (ref 0.3–1.2)
BUN: 18 mg/dL (ref 6–20)
CHLORIDE: 101 mmol/L (ref 101–111)
CO2: 30 mmol/L (ref 22–32)
CREATININE: 1.18 mg/dL (ref 0.61–1.24)
Calcium: 8.6 mg/dL — ABNORMAL LOW (ref 8.9–10.3)
GFR calc Af Amer: >60 mL/min (ref >60)
Glucose, Bld: 98 mg/dL (ref 65–99)
Potassium: 3 mmol/L — ABNORMAL LOW (ref 3.5–5.1)
Sodium: 137 mmol/L (ref 135–145)
Total Protein: 7.1 g/dL (ref 6.5–8.1)

## 2017-10-16 LAB — IRON AND TIBC
IRON: 41 ug/dL — AB (ref 45–182)
Saturation Ratios: 15 % — ABNORMAL LOW (ref 17.9–39.5)
TIBC: 269 ug/dL (ref 250–450)
UIBC: 228 ug/dL

## 2017-10-16 LAB — FERRITIN: FERRITIN: 53 ng/mL (ref 24–336)

## 2017-10-16 NOTE — Progress Notes (Signed)
[place] FOLLOW-UP NOTE  Patient Care Team: Deloria Lair., MD as PCP - General (Family Medicine)  CHIEF COMPLAINTS:  Microcytic anemia.  HISTORY OF PRESENTING ILLNESS:  Antonio [name] 47 y.o. male is seen in consultation today for further workup and management of microcytic anemia.  He was found to have an abnormal CBC from January 2019 where his hemoglobin was around 8.9.  MCV was low at 74.  He denies any major bleeding per rectum.  He sees occasional slight blood when he strains.  No melena was reported.  Denies any fevers, night sweats or weight loss in the last 6 months.  Denies any hospitalization in the last 1 year.  He had received up to 4 units of blood transfusion about 2 years ago.  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  A recent capsule study was incomplete twice.  He did not reveal any source of bleed.  Colonoscopy and EGD were done in October 2017 which showed small polyps, rectal varices.  EGD showed grade 2 esophageal varices and portal hypertensive gastropathy.  He is not taking any NSAIDs.  He does not report any pica.  He complains of  feeling tired all the time.  Denies any chest pains or lightheadedness.   INTERVAL HISTORY:  Antonio [name] 47 y.o. male returns for routine follow-up for anemia.   Since his last visit, he received 2 doses of IV iron (1 dose of IV Feraheme and 1 dose of IV Injectafter).  Overall, he states that he feels better.  Hgb has improved from 8.7 g/dL on 09/10/17 to 11.8 g/dL today.    Oncology Flowsheet 09/19/2017  ferumoxytol [facility]) IV 510 mg   Oncology Flowsheet 09/26/2017  ferric carboxymaltose (INJECTAFER) IV 750 mg   Denies any frank bleeding episodes.   Historic labs reviewed; there may be an element of anemia d/t CKD. We will keep monitoring this going forward.  No additional work-up needed for this at this time given excellent Hgb response to IV iron.      MEDICAL HISTORY:   Past Medical History:  Diagnosis Date  . Alcohol abuse   . Alcoholic hepatitis   . 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 BIOPSY  03/08/2016   Procedure: BIOPSY;  Surgeon: Daneil Dolin, MD;  [facility];  Service: Endoscopy;;  descending colon biopsies  . Colonoscopy     per patient around 2014 in [place]   . COLONOSCOPY WITH PROPOFOL [name] 03/08/2016   Procedure: COLONOSCOPY WITH PROPOFOL;  Surgeon: Daneil Dolin, MD;  [facility];  Service: Endoscopy;  Laterality: [name];  . ESOPHAGOGASTRODUODENOSCOPY (EGD) WITH PROPOFOL [name] 12/28/2015   Dr. Oneida Alar: 4 columns of esophageal varices, 3 grade 1, 1 grade 2 with no evidence of bleeding. Mild portal gastropathy with no evidence of active bleeding.  . ESOPHAGOGASTRODUODENOSCOPY (EGD) WITH PROPOFOL [name] 03/08/2016   Procedure: ESOPHAGOGASTRODUODENOSCOPY (EGD) WITH PROPOFOL;  Surgeon: Daneil Dolin, MD;  [facility];  Service: Endoscopy;  Laterality: [name];  . GIVENS CAPSULE STUDY [name] 07/05/2017   Procedure: GIVENS CAPSULE STUDY;  Surgeon: Danie Binder, MD;  [facility];  Service: Endoscopy;  Laterality: [name];  7:30am  . GIVENS CAPSULE STUDY [name] 08/05/2017   Procedure: GIVENS CAPSULE STUDY;  Surgeon: Danie Binder, MD;  [facility];  Service: Endoscopy;  Laterality:  [name];  7:30am  . POLYPECTOMY  03/08/2016   Procedure: POLYPECTOMY;  Surgeon: Daneil Dolin, MD;  [facility];  Service: Endoscopy;;  cecum and descending     SOCIAL HISTORY: Social History   Socioeconomic History  . Marital status: Married    Spouse name: Not on file  . Number of children: Not on file  . Years of education: Not on file  . Highest education level: Not on file  Occupational History  . Not on file  Social Needs  . Financial resource strain: Not on file  . Food insecurity:    Worry: Not on file    Inability: Not on file  .  Transportation needs:    Medical: Not on file    Non-medical: Not on file  Tobacco Use  . Smoking status: Former Smoker    Packs/day: 0.25    Types: Cigarettes    Last attempt to quit: 2016    Years since quitting: 3.3  . Smokeless tobacco: Never Used  Substance and Sexual Activity  . Alcohol use: No    Comment: former--quit after 12/2015 hospitalization  . Drug use: No  . Sexual activity: Yes  Lifestyle  . Physical activity:    Days per week: Not on file    Minutes per session: Not on file  . Stress: Not on file  Relationships  . Social connections:    Talks on phone: Not on file    Gets together: Not on file    Attends religious service: Not on file    Active member of club or organization: Not on file    Attends meetings of clubs or organizations: Not on file    Relationship status: Not on file  . Intimate partner violence:    Fear of current or ex partner: Not on file    Emotionally abused: Not on file    Physically abused: Not on file    Forced sexual activity: Not on file  Other Topics Concern  . Not on file  Social History Narrative  . Not on file    FAMILY HISTORY: Family History  Problem Relation Age of Onset  . Colon cancer Neg Hx   . Liver disease Neg Hx     ALLERGIES:  has No Known Allergies.  MEDICATIONS:  Current Outpatient Medications  Medication Sig Dispense Refill  . Calcium Carb-Cholecalciferol (CALCIUM 600+D) 600-800 MG-UNIT TABS Take 1 tablet by mouth 2 (two) times daily.     Marland Kitchen lactulose (CHRONULAC) 10 GM/15ML solution Take 30 mLs (20 g total) by mouth 3 (three) times daily. 2700 mL 5  . nadolol (CORGARD) 20 MG tablet Take 1 tablet (20 mg total) by mouth 2 (two) times daily. 60 tablet 11  . pantoprazole (PROTONIX) 40 MG tablet Take 1 tablet (40 mg total) by mouth daily before breakfast. 30 tablet 11  . rifaximin (XIFAXAN) 550 MG TABS tablet Take 1 tablet (550 mg total) by mouth 2 (two) times daily. 60 tablet 11  . torsemide (DEMADEX) 20 MG  tablet Take 1 tablet (20 mg total) by mouth daily. PT takes 1/2 tablet when he has extra swelling. 30 tablet 5   No current facility-administered medications for this visit.     REVIEW OF SYSTEMS:   Constitutional: Denies fevers, chills or abnormal night sweats.  Complains of fatigue. Eyes: Denies blurriness of vision, double vision or watery eyes Ears, nose, mouth, throat, and face: Denies mucositis or sore throat Respiratory: Denies cough, dyspnea or wheezes Cardiovascular:  Denies palpitation, chest discomfort or lower extremity swelling.  Occasional dizziness.  Leg swelling present. Gastrointestinal:  Denies nausea, heartburn or change in bowel habits Skin: Denies abnormal skin rashes Lymphatics: Denies new lymphadenopathy or easy bruising Neurological:Denies numbness, tingling or new weaknesses Behavioral/Psych: Mood is stable, no new changes  All other systems were reviewed with the patient and are negative.  PHYSICAL EXAMINATION: ECOG PERFORMANCE STATUS: 1 - Symptomatic but completely ambulatory  Vitals:   10/16/17 1430  BP: 132/82  Pulse: 69  Resp: 18  SpO2: 98%   Filed Weights   10/16/17 1430  Weight: (!) 325 lb (147.4 kg)    GENERAL:alert, no distress and comfortable SKIN: skin color, texture, turgor are normal, no rashes or significant lesions EYES: normal, conjunctiva are pink and non-injected, sclera clear   LABORATORY DATA:  I have reviewed the data as listed CBC    Component Value Date/Time   WBC 5.5 10/16/2017 1351   RBC 4.44 10/16/2017 1351   HGB 11.8 (L) 10/16/2017 1351   HGB 6.7 (L) 01/10/2016 1730   HCT 37.3 (L) 10/16/2017 1351   PLT 169 10/16/2017 1351   MCV 84.0 10/16/2017 1351   MCH 26.6 10/16/2017 1351   MCHC 31.6 10/16/2017 1351   RDW 21.0 (H) 09/10/2017 1345   LYMPHSABS 1.7 10/16/2017 1351   MONOABS 0.6 10/16/2017 1351   EOSABS 0.2 10/16/2017 1351   BASOSABS 0.1 10/16/2017 1351     RADIOGRAPHIC STUDIES: I have personally reviewed  the ultrasound of the abdomen dated 07/02/2016 which shows findings consistent with cirrhosis.  ASSESSMENT & PLAN:  Iron deficiency anemia due to chronic blood loss 1.  Microcytic anemia: - Colonoscopy on 03/09/2016 shows 8 mm tubular adenoma in the descending colon, rectal varices and melanosis coli -EGD on 03/08/2016 shows grade 2 esophageal varices, portal hypertensive gastropathy, capsule study was limited and did not identify a source of bleed - Likely etiology is iron deficiency from chronic blood loss, with mild component of chronic kidney disease.  He received Feraheme infusion on 09/19/2017 and 09/26/2017.  His hemoglobin improved from 8.7-11.8.  MCV improved from 76-84.  Ferritin and iron panel are pending.  Serum protein electrophoresis was negative.  Today he does not require any further parenteral iron.  We will see him back in 3 months with repeat blood work.  His energy levels have greatly improved.  2.  Alcoholic cirrhosis: -Patient quit drinking alcohol 2 years ago.  He has compensated cirrhosis.  No palpable splenomegaly.  No cytopenias yet.  He is taking nadolol, lactulose and rifaximin.     All questions were answered. The patient knows to call the clinic with any problems, questions or concerns.    This note includes documentation from Mike Craze, NP, who was present during this patient's office visit and evaluation.  I have reviewed this note for its completeness and accuracy.  I have edited this note accordingly based on my findings and medical opinion.     Derek Jack, MD  10/16/17 4:51 PM

## 2017-10-16 NOTE — Assessment & Plan Note (Signed)
1.  Microcytic anemia: - Colonoscopy on 03/09/2016 shows 8 mm tubular adenoma in the descending colon, rectal varices and melanosis coli -EGD on 03/08/2016 shows grade 2 esophageal varices, portal hypertensive gastropathy, capsule study was limited and did not identify a source of bleed - Likely etiology is iron deficiency from chronic blood loss, with mild component of chronic kidney disease.  He received Feraheme infusion on 09/19/2017 and 09/26/2017.  His hemoglobin improved from 8.7-11.8.  MCV improved from 76-84.  Ferritin and iron panel are pending.  Serum protein electrophoresis was negative.  Today he does not require any further parenteral iron.  We will see him back in 3 months with repeat blood work.  His energy levels have greatly improved.  2.  Alcoholic cirrhosis: -Patient quit drinking alcohol 2 years ago.  He has compensated cirrhosis.  No palpable splenomegaly.  No cytopenias yet.  He is taking nadolol, lactulose and rifaximin.

## 2017-10-31 ENCOUNTER — Encounter: Payer: Self-pay | Admitting: Gastroenterology

## 2018-01-02 ENCOUNTER — Ambulatory Visit: Payer: Managed Care, Other (non HMO) | Admitting: Gastroenterology

## 2018-01-02 ENCOUNTER — Other Ambulatory Visit: Payer: Self-pay | Admitting: Gastroenterology

## 2018-01-03 ENCOUNTER — Telehealth: Payer: Self-pay | Admitting: Gastroenterology

## 2018-01-03 NOTE — Telephone Encounter (Signed)
PATIENT'S WIFE CALLED ASKING IF HER HUSBANDS PRESCRIPTION WILL BE CALLED IN TODAY?  TORSEMIDE

## 2018-01-03 NOTE — Telephone Encounter (Signed)
Tried to call, no connection. This is in refill box and should be done today.

## 2018-01-16 ENCOUNTER — Other Ambulatory Visit (HOSPITAL_COMMUNITY): Payer: Managed Care, Other (non HMO)

## 2018-01-23 ENCOUNTER — Ambulatory Visit (HOSPITAL_COMMUNITY): Payer: Managed Care, Other (non HMO) | Admitting: Hematology

## 2018-01-23 ENCOUNTER — Other Ambulatory Visit (HOSPITAL_COMMUNITY): Payer: Managed Care, Other (non HMO)

## 2018-02-24 ENCOUNTER — Other Ambulatory Visit: Payer: Self-pay | Admitting: Gastroenterology

## 2018-03-13 ENCOUNTER — Ambulatory Visit: Payer: Managed Care, Other (non HMO) | Admitting: Gastroenterology

## 2018-05-01 ENCOUNTER — Encounter: Payer: Self-pay | Admitting: Gastroenterology

## 2018-06-05 ENCOUNTER — Ambulatory Visit: Payer: Managed Care, Other (non HMO) | Admitting: Gastroenterology

## 2018-06-16 ENCOUNTER — Telehealth: Payer: Self-pay | Admitting: Gastroenterology

## 2018-06-16 ENCOUNTER — Other Ambulatory Visit: Payer: Self-pay | Admitting: Gastroenterology

## 2018-06-16 NOTE — Telephone Encounter (Signed)
Routing to DS 

## 2018-06-16 NOTE — Telephone Encounter (Signed)
Pt's wife called to say that patient needed a refill on Corgard 20mg . [facility] in [place]. [phone]

## 2018-06-16 NOTE — Telephone Encounter (Signed)
Pt's wife called to say that patient needed a refill on Corgard 20mg . [facility] [place]. [phone]

## 2018-06-16 NOTE — Telephone Encounter (Signed)
The request is already in refill box.

## 2018-07-23 ENCOUNTER — Encounter: Payer: Self-pay | Admitting: *Deleted

## 2018-07-23 ENCOUNTER — Other Ambulatory Visit: Payer: Self-pay | Admitting: *Deleted

## 2018-07-23 ENCOUNTER — Encounter: Payer: Self-pay | Admitting: Gastroenterology

## 2018-07-23 ENCOUNTER — Ambulatory Visit (INDEPENDENT_AMBULATORY_CARE_PROVIDER_SITE_OTHER): Payer: Managed Care, Other (non HMO) | Admitting: Gastroenterology

## 2018-07-23 DIAGNOSIS — K7030 Alcoholic cirrhosis of liver without ascites: Secondary | ICD-10-CM | POA: Diagnosis not present

## 2018-07-23 NOTE — Assessment & Plan Note (Signed)
PT AWARE HE SHOULD LOSE WEIGHT.  SEE NUTRITION FOR ASSISTANCE IN WEIGHT LOSS/MEAL PLANS. CONTINUE TO EXERCISE. If you are unable to lose weight on your own after 6 mos to a year, please call for a referral to the bariatric weight loss clinic.

## 2018-07-23 NOTE — Patient Instructions (Addendum)
CONTINUE YOUR WEIGHT LOSS EFFORTS. SEE NUTRITION FOR ASSISTANCE IN WEIGHT LOSS/MEAL PLANS.  CONTINUE TO EXERCISE. If you are unable to lose weight on your own after 6 mos to a year, please call for a referral to the bariatric weight loss clinic.   COMPLETE LABS AND ULTRASOUND WITHIN ONE WEEK.  PICK UP LETTER FOR LICENSE AFTER 4 PM MAR 4.  YOU MUST COMPLETE LABS & ULTRASOUND BEFORE YOU CAN PICK UP THE LETTER.   YOUR BIOPSY RESULTS WILL BE BACK IN 5 BUSINESS DAYS.  FOLLOW UP IN 6 MOS.

## 2018-07-23 NOTE — Assessment & Plan Note (Addendum)
Avoiding ETOH. WELL COMPENSATED DISEASE.  AVOID ETOH. COMPLETE LABS AND ULTRASOUND WITHIN ONE WEEK. PICK UP LETTER FOR LICENSE AFTER 4 PM MAR 4 RESULTS WILL BE BACK IN 5 BUSINESS DAYS. EXPLAINED PT NEEDS ULTRASOUND AND LABS AND FOLLOW UP IN 6 MOS.

## 2018-07-23 NOTE — Progress Notes (Signed)
Subjective:    Patient ID: Antonio [name], male    DOB: 1970/08/25, 48 y.o.   MRN: [id number]  Deloria Lair., MD   HPI LAST SEEN JAN 2019: 321 LBS. CONCERNED ABOUT WEIGHT GAIN. BMs: 2-3 times a a day.  PT DENIES FEVER, CHILLS, HEMATOCHEZIA, HEMATEMESIS, nausea, vomiting, melena, diarrhea, CHEST PAIN, SHORTNESS OF BREATH,  CHANGE IN BOWEL IN HABITS, constipation, abdominal pain, problems swallowing, or heartburn or indigestion.  Past Medical History:  Diagnosis Date  . Alcohol abuse   . Alcoholic hepatitis   . Cirrhosis with alcoholism [facility])     Past Surgical History:  Procedure Laterality Date  . BIOPSY  03/08/2016   Procedure: BIOPSY;  Surgeon: Daneil Dolin, MD;  [facility];  Service: Endoscopy;;  descending colon biopsies  . Colonoscopy     per patient around 2014 in [place]   . COLONOSCOPY WITH PROPOFOL [name] 03/08/2016   Procedure: COLONOSCOPY WITH PROPOFOL;  Surgeon: Daneil Dolin, MD;  [facility];  Service: Endoscopy;  Laterality: [name];  . ESOPHAGOGASTRODUODENOSCOPY (EGD) WITH PROPOFOL [name] 12/28/2015   Dr. Oneida Alar: 4 columns of esophageal varices, 3 grade 1, 1 grade 2 with no evidence of bleeding. Mild portal gastropathy with no evidence of active bleeding.  . ESOPHAGOGASTRODUODENOSCOPY (EGD) WITH PROPOFOL [name] 03/08/2016   Procedure: ESOPHAGOGASTRODUODENOSCOPY (EGD) WITH PROPOFOL;  Surgeon: Daneil Dolin, MD;  [facility];  Service: Endoscopy;  Laterality: [name];  . GIVENS CAPSULE STUDY [name] 07/05/2017   Procedure: GIVENS CAPSULE STUDY;  Surgeon: Danie Binder, MD;  [facility];  Service: Endoscopy;  Laterality: [name];  7:30am  . GIVENS CAPSULE STUDY [name] 08/05/2017   Procedure: GIVENS CAPSULE STUDY;  Surgeon: Danie Binder, MD;  [facility];  Service: Endoscopy;  Laterality: [name];  7:30am  . POLYPECTOMY  03/08/2016   Procedure: POLYPECTOMY;  Surgeon: Daneil Dolin, MD;  [facility];  Service: Endoscopy;;   cecum and descending     No Known Allergies  Current Outpatient Medications  Medication Sig      CA WITH VITAMIN D Take 1 tablet by mouth 2 (two) times daily.     Marland Kitchen lactulose 10 GM/15ML solution Take 30 mLs TID. PRN   . nadolol  20 MG tablet TAKE 1 TABLET BY MOUTH TWICE A DAY    . PROTONIX 40 MG tablet TAKE ONCE DAILY 30 MINS BEFORE BREAKFAST    . torsemide 20 MG tablet TAKE 1/2-1 TABLE PRN SWELLING 2X/WEEK   .       Review of Systems PER HPI OTHERWISE ALL SYSTEMS ARE NEGATIVE.    Objective:   Physical Exam Vitals signs reviewed.  Constitutional:      General: He is not in acute distress.    Appearance: He is well-developed.  HENT:     Head: Normocephalic and atraumatic.     Mouth/Throat:     Pharynx: No oropharyngeal exudate.  Eyes:     General: No scleral icterus.    Pupils: Pupils are equal, round, and reactive to light.  Neck:     Musculoskeletal: Normal range of motion and neck supple.  Cardiovascular:     Rate and Rhythm: Normal rate and regular rhythm.     Heart sounds: Normal heart sounds.  Pulmonary:     Effort: Pulmonary effort is normal. No respiratory distress.     Breath sounds: Normal breath sounds.  Abdominal:     General: Bowel sounds are normal.  There is no distension.     Palpations: Abdomen is soft.     Tenderness: There is no abdominal tenderness.  Musculoskeletal:     Right lower leg: No edema.     Left lower leg: No edema.  Lymphadenopathy:     Cervical: No cervical adenopathy.  Skin:    General: Skin is warm and dry.  Neurological:     Mental Status: He is alert and oriented to person, place, and time. Mental status is at baseline.     Comments: NO FOCAL DEFICITS  Psychiatric:        Mood and Affect: Mood normal.        Behavior: Behavior normal.     Comments: NORMAL AFFECT       Assessment & Plan:

## 2018-07-24 NOTE — Progress Notes (Signed)
cc'ed to pcp °

## 2018-07-25 NOTE — Progress Notes (Signed)
ON RECALL  °

## 2018-07-28 ENCOUNTER — Ambulatory Visit (HOSPITAL_COMMUNITY)
Admission: RE | Admit: 2018-07-28 | Discharge: 2018-07-28 | Disposition: A | Discharge status: Home / Self Care | Payer: Managed Care, Other (non HMO) | Source: Ambulatory Visit | Attending: Gastroenterology | Admitting: Gastroenterology

## 2018-07-28 DIAGNOSIS — K7030 Alcoholic cirrhosis of liver without ascites: Secondary | ICD-10-CM | POA: Diagnosis present

## 2018-07-29 ENCOUNTER — Other Ambulatory Visit: Payer: Self-pay

## 2018-07-29 DIAGNOSIS — K7030 Alcoholic cirrhosis of liver without ascites: Secondary | ICD-10-CM

## 2018-07-29 LAB — CBC WITH DIFFERENTIAL/PLATELET
ABSOLUTE MONOCYTES: 616 {cells}/uL (ref 200–950)
BASOS ABS: 71 {cells}/uL (ref 0–200)
Basophils Relative: 1.5 %
EOS ABS: 207 {cells}/uL (ref 15–500)
EOS PCT: 4.4 %
HEMATOCRIT: 37.4 % — AB (ref 38.5–50.0)
Hemoglobin: 12.7 g/dL — ABNORMAL LOW (ref 13.2–17.1)
LYMPHS ABS: 1377 {cells}/uL (ref 850–3900)
MCH: 29.3 pg (ref 27.0–33.0)
MCHC: 34 g/dL (ref 32.0–36.0)
MCV: 86.2 fL (ref 80.0–100.0)
MONOS PCT: 13.1 %
MPV: 12 fL (ref 7.5–12.5)
NEUTROS ABS: 2430 {cells}/uL (ref 1500–7800)
Neutrophils Relative %: 51.7 %
PLATELETS: 162 10*3/uL (ref 140–400)
RBC: 4.34 10*6/uL (ref 4.20–5.80)
RDW: 14.4 % (ref 11.0–15.0)
TOTAL LYMPHOCYTE: 29.3 %
WBC: 4.7 10*3/uL (ref 3.8–10.8)

## 2018-07-29 LAB — COMPLETE METABOLIC PANEL WITH GFR
AG Ratio: 0.7 (calc) — ABNORMAL LOW (ref 1.0–2.5)
ALBUMIN MSPROF: 2.8 g/dL — AB (ref 3.6–5.1)
ALKALINE PHOSPHATASE (APISO): 220 U/L — AB (ref 36–130)
ALT: 18 U/L (ref 9–46)
AST: 52 U/L — ABNORMAL HIGH (ref 10–40)
BILIRUBIN TOTAL: 2.6 mg/dL — AB (ref 0.2–1.2)
BUN: 11 mg/dL (ref 7–25)
CHLORIDE: 110 mmol/L (ref 98–110)
CO2: 25 mmol/L (ref 20–32)
Calcium: 9.4 mg/dL (ref 8.6–10.3)
Creat: 0.99 mg/dL (ref 0.60–1.35)
GFR, Est African American: 105 mL/min/{1.73_m2} (ref >=60)
GFR, Est Non African American: 91 mL/min/{1.73_m2} (ref >=60)
GLOBULIN: 3.9 g/dL — AB (ref 1.9–3.7)
GLUCOSE: 92 mg/dL (ref 65–99)
POTASSIUM: 4.4 mmol/L (ref 3.5–5.3)
SODIUM: 140 mmol/L (ref 135–146)
Total Protein: 6.7 g/dL (ref 6.1–8.1)

## 2018-07-29 LAB — PROTIME-INR
INR: 1.2 — ABNORMAL HIGH
Prothrombin Time: 12.3 s — ABNORMAL HIGH (ref 9.0–11.5)

## 2018-07-29 NOTE — Progress Notes (Signed)
Letter mailed with results and forwarding to [facility] to nic next [place].

## 2018-07-29 NOTE — Progress Notes (Signed)
cc'd to pcp 

## 2018-07-29 NOTE — Progress Notes (Signed)
Called and no answer.

## 2018-07-29 NOTE — Progress Notes (Signed)
Mailed letter with results.  Lab orders on file for September 2020.

## 2018-07-29 NOTE — Progress Notes (Signed)
ON RECALL AND CC'D TO PCP °

## 2018-07-30 ENCOUNTER — Encounter: Payer: Self-pay | Admitting: General Practice

## 2018-07-30 ENCOUNTER — Telehealth: Payer: Self-pay | Admitting: Gastroenterology

## 2018-07-30 NOTE — Telephone Encounter (Signed)
LETTER SENT TO [place] TO BE PLACED ON LETTER HEAD.

## 2018-07-30 NOTE — Progress Notes (Signed)
ON RECALL  °

## 2018-07-30 NOTE — Telephone Encounter (Signed)
Letter printed and given to the patient

## 2018-07-30 NOTE — Telephone Encounter (Signed)
Patient here to pick up papers from [facility], she said to print them in 10 minutes, they would be ready

## 2018-08-28 ENCOUNTER — Other Ambulatory Visit: Payer: Self-pay | Admitting: Gastroenterology

## 2018-09-19 ENCOUNTER — Other Ambulatory Visit: Payer: Self-pay | Admitting: Gastroenterology

## 2018-12-26 ENCOUNTER — Telehealth: Payer: Self-pay | Admitting: Gastroenterology

## 2018-12-26 ENCOUNTER — Encounter: Payer: Self-pay | Admitting: Gastroenterology

## 2018-12-26 NOTE — Telephone Encounter (Signed)
RECALL FOR ULTRASOUND 

## 2018-12-26 NOTE — Telephone Encounter (Signed)
Recall sent 

## 2019-01-13 ENCOUNTER — Other Ambulatory Visit: Payer: Self-pay

## 2019-01-13 DIAGNOSIS — K7030 Alcoholic cirrhosis of liver without ascites: Secondary | ICD-10-CM

## 2019-02-13 ENCOUNTER — Other Ambulatory Visit: Payer: Self-pay

## 2019-02-19 ENCOUNTER — Other Ambulatory Visit: Payer: Self-pay

## 2019-02-19 ENCOUNTER — Encounter: Payer: Self-pay | Admitting: Gastroenterology

## 2019-02-19 ENCOUNTER — Ambulatory Visit (INDEPENDENT_AMBULATORY_CARE_PROVIDER_SITE_OTHER): Payer: Managed Care, Other (non HMO) | Admitting: Gastroenterology

## 2019-02-19 DIAGNOSIS — K7030 Alcoholic cirrhosis of liver without ascites: Secondary | ICD-10-CM | POA: Diagnosis not present

## 2019-02-19 DIAGNOSIS — I8510 Secondary esophageal varices without bleeding: Secondary | ICD-10-CM | POA: Diagnosis not present

## 2019-02-19 NOTE — Progress Notes (Signed)
Subjective:    Patient ID: Celedonio Loup, male    DOB: 11/01/1970, 48 y.o.   MRN: [id number]  Deloria Lair., MD  HPI WANTS to lose weight. Takes torsemide. He starts cramping WITH A WHOLE TABLET. TORSEMIDE(HALF) USES IT 3-4X.MOS. BOUGHT A BIKE TO RIDE(INDOOR). BMs: REGULAR. PERIODICALLY NEEDS SOMETHING FOR CONSTIPATION.   PT DENIES FEVER, CHILLS, HEMATOCHEZIA, HEMATEMESIS, nausea, vomiting, melena, diarrhea, CHEST PAIN, SHORTNESS OF BREATH, CHANGE IN BOWEL IN HABITS, abdominal pain, problems swallowing, OR heartburn or indigestion.  Past Medical History:  Diagnosis Date  . Alcohol abuse   . Alcoholic hepatitis   . Cirrhosis with alcoholism [facility])    Past Surgical History:  Procedure Laterality Date  . BIOPSY  03/08/2016   Procedure: BIOPSY;  Surgeon: Daneil Dolin, MD;  [facility];  Service: Endoscopy;;  descending colon biopsies  . Colonoscopy     per patient around 2014 in [place]   . COLONOSCOPY WITH PROPOFOL [name] 03/08/2016   Procedure: COLONOSCOPY WITH PROPOFOL;  Surgeon: Daneil Dolin, MD;  [facility];  Service: Endoscopy;  Laterality: [name];  . ESOPHAGOGASTRODUODENOSCOPY (EGD) WITH PROPOFOL [name] 12/28/2015   Dr. Oneida Alar: 4 columns of esophageal varices, 3 grade 1, 1 grade 2 with no evidence of bleeding. Mild portal gastropathy with no evidence of active bleeding.  . ESOPHAGOGASTRODUODENOSCOPY (EGD) WITH PROPOFOL [name] 03/08/2016   Procedure: ESOPHAGOGASTRODUODENOSCOPY (EGD) WITH PROPOFOL;  Surgeon: Daneil Dolin, MD;  [facility];  Service: Endoscopy;  Laterality: [name];  . GIVENS CAPSULE STUDY [name] 07/05/2017   Procedure: GIVENS CAPSULE STUDY;  Surgeon: Danie Binder, MD;  [facility];  Service: Endoscopy;  Laterality: [name];  7:30am  . GIVENS CAPSULE STUDY [name] 08/05/2017   Procedure: GIVENS CAPSULE STUDY;  Surgeon: Danie Binder, MD;  [facility];  Service: Endoscopy;  Laterality: [name];  7:30am  . POLYPECTOMY  03/08/2016   Procedure: POLYPECTOMY;  Surgeon: Daneil Dolin, MD;  [facility];  Service: Endoscopy;;  cecum and descending    No Known Allergies  Current Outpatient Medications  Medication Sig    . CALCIUM 600+D 600-800 MG-UNIT Take 1 tablet BID     . lactulose, encephalopathy, (CHRONULAC) 10 GM/15ML SOLN TAKE 30 ML BY MOUTH THREE TIMES DAILY (Patient taking differently: as needed. )    . nadolol (CORGARD) 20 MG tablet TAKE 1 TABLET BY MOUTH TWICE A DAY    . PROTONIX 40 MG tablet TAKE ONE TABLET Qac BREAKFAST    . torsemide (DEMADEX) 20 MG tablet 1/2 TABLET WHEN PATIENT HAS EXTRA SWELLING    .       Review of Systems   PER HPI OTHERWISE ALL SYSTEMS ARE NEGATIVE.    Objective:   Physical Exam Vitals signs reviewed.  Constitutional:      General: He is not in acute distress.    Appearance: He is well-developed.  HENT:     Head: Normocephalic and atraumatic.     Mouth/Throat:     Comments: MASK IN PLACE Eyes:     General: No scleral icterus.    Pupils: Pupils are equal, round, and reactive to light.  Neck:     Musculoskeletal: Normal range of motion and neck supple.  Cardiovascular:     Rate and Rhythm: Normal rate and regular rhythm.     Heart sounds: Normal heart sounds.  Pulmonary:     Effort: Pulmonary effort is normal. No respiratory distress.  Breath sounds: Normal breath sounds.  Abdominal:     General: Bowel sounds are normal. There is no distension.     Palpations: Abdomen is soft.     Tenderness: There is no abdominal tenderness.  Musculoskeletal:     Right lower leg: Edema present.     Left lower leg: Edema present.     Comments: TRACE BILATERAL LOWER EXTREMITIES  Lymphadenopathy:     Cervical: No cervical adenopathy.  Skin:    General: Skin is warm and dry.  Neurological:     Mental Status: He is alert and oriented to person, place, and time.     Comments: NO  NEW FOCAL DEFICITS  Psychiatric:        Mood and Affect: Mood normal.        Behavior:  Behavior normal.        Thought Content: Thought content normal.       Assessment & Plan:

## 2019-02-19 NOTE — Assessment & Plan Note (Signed)
WELL COMPENSATED DISEASE.   DISCUSSED OBESITY AND HOW IT CAN MAKE CIRRHOSIS WORSE.  RIDE BIKE 30 MINS 5 DAYS A WEEK. PEDAL AS FAST AS YOU CAN FOR 30 SECONDS THEN SLOW DOWN FOR 1 MIN AND 30 SECONDS. EAT TO LIVE AND THINK OF FOOD AS MEDICINE. 75% OF YOUR PLATE SHOULD BE FRUITS/VEGGIES. DRINK WATER TO KEEP YOUR URINE LIGHT YELLOW. To have more energy and to lose weight:      1. CONTINUE YOUR WEIGHT LOSS EFFORTS. I RECOMMEND YOU READ AND FOLLOW RECOMMENDATIONS BY DR. MARK HYMAN, "10-DAY DETOX DIET".   2. If you must eat bread, EAT EZEKIEL BREAD. IT IS IN THE FROZEN SECTION OF THE GROCERY STORE.   3. Do not drink SODA, GATORADE, ENERGY DRINKS, OR DIET SODA.    4. AVOID HIGH FRUCTOSE CORN SYRUP.    5. DO NOT chew SUGAR FREE GUM OR USE ARTIFICIAL SWEETENERS. USE STEVIA AS A SWEETENER.   6. DO NOT EAT ENRICHED WHEAT FLOUR, PASTA, RICE, OR CEREAL.   7. ONLY EAT WILD CAUGHT SEAFOOD, GRASS FED BEEF OR CHICKEN, PORK FROM PASTURE RAISE PIGS, OR EGGS FROM PASTURE RAISED CHICKENS.   8. START TAKING A MULTIVITAMIN, VITAMIN B12, AND VITAMIN D3 2000 IU DAILY.  ADDITIONAL SUPPLEMENTS:  1. CINAMMON 500 MG EVERY AM ON AN EMPTY STOMACH. 2. ZINC LOZENGES ONCE DAILY WITH A MEAL. 3. ADD CHROMIUM 500 MG TWICE DAILY WITH A MEALS. 4. GREEN TEA EXTRACT ONCE DAILY WITH A MEAL.  COMPLETE LABS AND ULTRASOUND. COMPLETE  UPPER ENDOSCOPY DEC/JAN. CONSIDER D/C NADOLOL IF PT IS CHILD PUGH A. FOLLOW UP IN 6 MOS.

## 2019-02-19 NOTE — Assessment & Plan Note (Signed)
GRADE II IN 2017 CHILD PUGH B.   MANAGED WITH NADOLOL. COMPLETE EGD. FOLLOW UP IN 6 MOS.

## 2019-02-19 NOTE — Patient Instructions (Addendum)
RIDE BIKE 30 MINS 5 DAYS A WEEK. PEDAL AS FAST AS YOU CAN FOR 30 SECONDS THEN SLOW DOWN FOR 1 MIN AND 30 SECONDS.  EAT TO LIVE AND THINK OF FOOD AS MEDICINE. 75% OF YOUR PLATE SHOULD BE FRUITS/VEGGIES.  DRINK WATER TO KEEP YOUR URINE LIGHT YELLOW.  To have more energy and to lose weight:      1. CONTINUE YOUR WEIGHT LOSS EFFORTS. I RECOMMEND YOU READ AND FOLLOW RECOMMENDATIONS BY DR. MARK HYMAN, "10-DAY DETOX DIET".    2. If you must eat bread, EAT EZEKIEL BREAD. IT IS IN THE FROZEN SECTION OF THE GROCERY STORE.    3. Do not drink SODA, GATORADE, ENERGY DRINKS, OR DIET SODA.     4. AVOID HIGH FRUCTOSE CORN SYRUP.     5. DO NOT chew SUGAR FREE GUM OR USE ARTIFICIAL SWEETENERS. USE STEVIA AS A SWEETENER.    6. DO NOT EAT ENRICHED WHEAT FLOUR, PASTA, RICE, OR CEREAL.    7. ONLY EAT WILD CAUGHT SEAFOOD, GRASS FED BEEF OR CHICKEN, PORK FROM PASTURE RAISE PIGS, OR EGGS FROM PASTURE RAISED CHICKENS.    8. START TAKING A MULTIVITAMIN, VITAMIN B12, AND VITAMIN D3 2000 IU DAILY. ADDITIONAL SUPPLEMENTS:  1. CINAMMON 500 MG EVERY AM ON AN EMPTY STOMACH. 2. ZINC LOZENGES ONCE DAILY WITH A MEAL. 3. ADD CHROMIUM 500 MG TWICE DAILY WITH A MEALS. 4. GREEN TEA EXTRACT ONCE DAILY WITH A MEAL.   COMPLETE LABS AND ULTRASOUND.  COMPLETE  UPPER ENDOSCOPY DEC/JAN.  FOLLOW UP IN 6 MOS.

## 2019-02-20 ENCOUNTER — Encounter: Payer: Self-pay | Admitting: *Deleted

## 2019-02-23 ENCOUNTER — Ambulatory Visit (HOSPITAL_COMMUNITY)
Admission: RE | Admit: 2019-02-23 | Discharge: 2019-02-23 | Disposition: A | Discharge status: Home / Self Care | Payer: Managed Care, Other (non HMO) | Source: Ambulatory Visit | Attending: Gastroenterology | Admitting: Gastroenterology

## 2019-02-23 ENCOUNTER — Other Ambulatory Visit: Payer: Self-pay

## 2019-02-23 DIAGNOSIS — K7030 Alcoholic cirrhosis of liver without ascites: Secondary | ICD-10-CM

## 2019-02-24 LAB — CBC WITH DIFFERENTIAL/PLATELET
Absolute Monocytes: 735 cells/uL (ref 200–950)
Basophils Absolute: 60 cells/uL (ref 0–200)
Basophils Relative: 1.4 %
Eosinophils Absolute: 211 cells/uL (ref 15–500)
Eosinophils Relative: 4.9 %
HCT: 37.5 % — ABNORMAL LOW (ref 38.5–50.0)
Hemoglobin: 12.3 g/dL — ABNORMAL LOW (ref 13.2–17.1)
Lymphs Abs: 1720 cells/uL (ref 850–3900)
MCH: 29.1 pg (ref 27.0–33.0)
MCHC: 32.8 g/dL (ref 32.0–36.0)
MCV: 88.9 fL (ref 80.0–100.0)
MPV: 12.8 fL — ABNORMAL HIGH (ref 7.5–12.5)
Monocytes Relative: 17.1 %
Neutro Abs: 1574 cells/uL (ref 1500–7800)
Neutrophils Relative %: 36.6 %
Platelets: 139 10*3/uL — ABNORMAL LOW (ref 140–400)
RBC: 4.22 10*6/uL (ref 4.20–5.80)
RDW: 14.2 % (ref 11.0–15.0)
Total Lymphocyte: 40 %
WBC: 4.3 10*3/uL (ref 3.8–10.8)

## 2019-02-24 LAB — COMPREHENSIVE METABOLIC PANEL
AG Ratio: 0.8 (calc) — ABNORMAL LOW (ref 1.0–2.5)
ALT: 27 U/L (ref 9–46)
AST: 72 U/L — ABNORMAL HIGH (ref 10–40)
Albumin: 3 g/dL — ABNORMAL LOW (ref 3.6–5.1)
Alkaline phosphatase (APISO): 214 U/L — ABNORMAL HIGH (ref 36–130)
BUN: 8 mg/dL (ref 7–25)
CO2: 23 mmol/L (ref 20–32)
Calcium: 8.7 mg/dL (ref 8.6–10.3)
Chloride: 111 mmol/L — ABNORMAL HIGH (ref 98–110)
Creat: 0.75 mg/dL (ref 0.60–1.35)
Globulin: 3.7 g/dL (calc) (ref 1.9–3.7)
Glucose, Bld: 95 mg/dL (ref 65–99)
Potassium: 4.3 mmol/L (ref 3.5–5.3)
Sodium: 141 mmol/L (ref 135–146)
Total Bilirubin: 1.5 mg/dL — ABNORMAL HIGH (ref 0.2–1.2)
Total Protein: 6.7 g/dL (ref 6.1–8.1)

## 2019-02-24 LAB — PROTIME-INR
INR: 1.2 — ABNORMAL HIGH
Prothrombin Time: 12.3 s — ABNORMAL HIGH (ref 9.0–11.5)

## 2019-02-25 ENCOUNTER — Other Ambulatory Visit: Payer: Self-pay

## 2019-02-26 MED ORDER — PANTOPRAZOLE SODIUM 40 MG PO TBEC: DELAYED_RELEASE_TABLET | ORAL | 3 refills | Status: DC

## 2019-03-05 ENCOUNTER — Telehealth: Payer: Self-pay | Admitting: Gastroenterology

## 2019-03-05 NOTE — Telephone Encounter (Signed)
I spoke to pt's wife and have sent a note attached to the result note.

## 2019-03-05 NOTE — Telephone Encounter (Signed)
Pt's wife called with questions about medications. She said she would call back at 130 because she's at work and can't answer the phone.

## 2019-03-09 ENCOUNTER — Telehealth: Payer: Self-pay

## 2019-03-09 NOTE — Telephone Encounter (Signed)
Opened in error

## 2019-04-08 NOTE — Patient Instructions (Signed)
Antonio [name]  04/08/2019     @PREFPERIOPPHARMACY @   Your procedure is scheduled on  04/14/2019   Report to [facility] at  1315 (1:15)  P.M.  Call this number if you have problems the morning of surgery:  [phone]   Remember:  Follow the diet instructions given to you by Dr Oneida Alar office.                       Take these medicines the morning of surgery with A SIP OF WATER  Nadolol, protonix.    Do not wear jewelry, make-up or nail polish.  Do not wear lotions, powders, or perfumes. Please wear deodorant and brush your teeth.  Do not shave 48 hours prior to surgery.  Men may shave face and neck.  Do not bring valuables to the hospital.  [facility] is not responsible for any belongings or valuables.  Contacts, dentures or bridgework may not be worn into surgery.  Leave your suitcase in the car.  After surgery it may be brought to your room.  For patients admitted to the hospital, discharge time will be determined by your treatment team.  Patients discharged the day of surgery will not be allowed to drive home.   Name and phone number of your driver:   family Special instructions:  None  Please read over the following fact sheets that you were given. Anesthesia Post-op Instructions and Care and Recovery After Surgery       Upper Endoscopy, Adult, Care After This sheet gives you information about how to care for yourself after your procedure. Your health care provider may also give you more specific instructions. If you have problems or questions, contact your health care provider. What can I expect after the procedure? After the procedure, it is common to have:  A sore throat.  Mild stomach pain or discomfort.  Bloating.  Nausea. Follow these instructions at home:   Follow instructions from your health care provider about what to eat or drink after your procedure.  Return to your normal activities as told by your health care provider. Ask your  health care provider what activities are safe for you.  Take over-the-counter and prescription medicines only as told by your health care provider.  Do not drive for 24 hours if you were given a sedative during your procedure.  Keep all follow-up visits as told by your health care provider. This is important. Contact a health care provider if you have:  A sore throat that lasts longer than one day.  Trouble swallowing. Get help right away if:  You vomit blood or your vomit looks like coffee grounds.  You have: ? A fever. ? Bloody, black, or tarry stools. ? A severe sore throat or you cannot swallow. ? Difficulty breathing. ? Severe pain in your chest or abdomen. Summary  After the procedure, it is common to have a sore throat, mild stomach discomfort, bloating, and nausea.  Do not drive for 24 hours if you were given a sedative during the procedure.  Follow instructions from your health care provider about what to eat or drink after your procedure.  Return to your normal activities as told by your health care provider. This information is not intended to replace advice given to you by your health care provider. Make sure you discuss any questions you have with your health care provider. Document Released: 11/13/2011 Document Revised: 11/05/2017 Document Reviewed: 10/14/2017  Elsevier Patient Education  2020 [place] After These instructions provide you with information about caring for yourself after your procedure. Your health care provider may also give you more specific instructions. Your treatment has been planned according to current medical practices, but problems sometimes occur. Call your health care provider if you have any problems or questions after your procedure. What can I expect after the procedure? After your procedure, you may:  Feel sleepy for several hours.  Feel clumsy and have poor balance for several hours.  Feel  forgetful about what happened after the procedure.  Have poor judgment for several hours.  Feel nauseous or vomit.  Have a sore throat if you had a breathing tube during the procedure. Follow these instructions at home: For at least 24 hours after the procedure:      Have a responsible adult stay with you. It is important to have someone help care for you until you are awake and alert.  Rest as needed.  Do not: ? Participate in activities in which you could fall or become injured. ? Drive. ? Use heavy machinery. ? Drink alcohol. ? Take sleeping pills or medicines that cause drowsiness. ? Make important decisions or sign legal documents. ? Take care of children on your own. Eating and drinking  Follow the diet that is recommended by your health care provider.  If you vomit, drink water, juice, or soup when you can drink without vomiting.  Make sure you have little or no nausea before eating solid foods. General instructions  Take over-the-counter and prescription medicines only as told by your health care provider.  If you have sleep apnea, surgery and certain medicines can increase your risk for breathing problems. Follow instructions from your health care provider about wearing your sleep device: ? Anytime you are sleeping, including during daytime naps. ? While taking prescription pain medicines, sleeping medicines, or medicines that make you drowsy.  If you smoke, do not smoke without supervision.  Keep all follow-up visits as told by your health care provider. This is important. Contact a health care provider if:  You keep feeling nauseous or you keep vomiting.  You feel light-headed.  You develop a rash.  You have a fever. Get help right away if:  You have trouble breathing. Summary  For several hours after your procedure, you may feel sleepy and have poor judgment.  Have a responsible adult stay with you for at least 24 hours or until you are awake and  alert. This information is not intended to replace advice given to you by your health care provider. Make sure you discuss any questions you have with your health care provider. Document Released: 09/04/2015 Document Revised: 08/12/2017 Document Reviewed: 09/04/2015 Elsevier Patient Education  2020 [employer].

## 2019-04-10 ENCOUNTER — Encounter (HOSPITAL_COMMUNITY): Payer: Self-pay

## 2019-04-10 ENCOUNTER — Other Ambulatory Visit: Payer: Self-pay

## 2019-04-10 ENCOUNTER — Other Ambulatory Visit (HOSPITAL_COMMUNITY)
Admission: RE | Admit: 2019-04-10 | Discharge: 2019-04-10 | Disposition: A | Discharge status: Home / Self Care | Payer: Managed Care, Other (non HMO) | Source: Ambulatory Visit | Attending: Gastroenterology | Admitting: Gastroenterology

## 2019-04-10 ENCOUNTER — Encounter (HOSPITAL_COMMUNITY)
Admission: RE | Admit: 2019-04-10 | Discharge: 2019-04-10 | Disposition: A | Discharge status: Home / Self Care | Payer: Managed Care, Other (non HMO) | Source: Ambulatory Visit | Attending: Gastroenterology | Admitting: Gastroenterology

## 2019-04-10 DIAGNOSIS — Z01812 Encounter for preprocedural laboratory examination: Secondary | ICD-10-CM | POA: Diagnosis not present

## 2019-04-10 DIAGNOSIS — Z20828 Contact with and (suspected) exposure to other viral communicable diseases: Secondary | ICD-10-CM | POA: Diagnosis not present

## 2019-04-10 HISTORY — DX: Personal history of other diseases of the musculoskeletal system and connective tissue: Z87.39

## 2019-04-10 LAB — CBC WITH DIFFERENTIAL/PLATELET
Abs Immature Granulocytes: 0.01 10*3/uL (ref 0.00–0.07)
Basophils Absolute: 0.1 10*3/uL (ref 0.0–0.1)
Basophils Relative: 1 %
Eosinophils Absolute: 0.2 10*3/uL (ref 0.0–0.5)
Eosinophils Relative: 4 %
HCT: 42.9 % (ref 39.0–52.0)
Hemoglobin: 13.3 g/dL (ref 13.0–17.0)
Immature Granulocytes: 0 %
Lymphocytes Relative: 32 %
Lymphs Abs: 1.7 10*3/uL (ref 0.7–4.0)
MCH: 27.6 pg (ref 26.0–34.0)
MCHC: 31 g/dL (ref 30.0–36.0)
MCV: 89 fL (ref 80.0–100.0)
Monocytes Absolute: 0.8 10*3/uL (ref 0.1–1.0)
Monocytes Relative: 16 %
Neutro Abs: 2.4 10*3/uL (ref 1.7–7.7)
Neutrophils Relative %: 47 %
Platelets: 209 10*3/uL (ref 150–400)
RBC: 4.82 MIL/uL (ref 4.22–5.81)
RDW: 16.5 % — ABNORMAL HIGH (ref 11.5–15.5)
WBC: 5.3 10*3/uL (ref 4.0–10.5)
nRBC: 0 % (ref 0.0–0.2)

## 2019-04-10 LAB — COMPREHENSIVE METABOLIC PANEL
ALT: 37 U/L (ref 0–44)
AST: 75 U/L — ABNORMAL HIGH (ref 15–41)
Albumin: 3.1 g/dL — ABNORMAL LOW (ref 3.5–5.0)
Alkaline Phosphatase: 205 U/L — ABNORMAL HIGH (ref 38–126)
Anion gap: 11 (ref 5–15)
BUN: 17 mg/dL (ref 6–20)
CO2: 23 mmol/L (ref 22–32)
Calcium: 9.2 mg/dL (ref 8.9–10.3)
Chloride: 104 mmol/L (ref 98–111)
Creatinine, Ser: 1.07 mg/dL (ref 0.61–1.24)
GFR calc Af Amer: >60 mL/min (ref >60)
GFR calc non Af Amer: >60 mL/min (ref >60)
Glucose, Bld: 98 mg/dL (ref 70–99)
Potassium: 3.5 mmol/L (ref 3.5–5.1)
Sodium: 138 mmol/L (ref 135–145)
Total Bilirubin: 2.3 mg/dL — ABNORMAL HIGH (ref 0.3–1.2)
Total Protein: 7.5 g/dL (ref 6.5–8.1)

## 2019-04-10 LAB — PROTIME-INR
INR: 1.3 — ABNORMAL HIGH (ref 0.8–1.2)
Prothrombin Time: 15.6 seconds — ABNORMAL HIGH (ref 11.4–15.2)

## 2019-04-10 LAB — SARS CORONAVIRUS 2 (TAT 6-24 HRS): SARS Coronavirus 2: NEGATIVE

## 2019-04-14 ENCOUNTER — Ambulatory Visit (HOSPITAL_COMMUNITY): Payer: Managed Care, Other (non HMO) | Admitting: Anesthesiology

## 2019-04-14 ENCOUNTER — Encounter (HOSPITAL_COMMUNITY)
Admission: RE | Disposition: A | Discharge status: Home / Self Care | Payer: Self-pay | Source: Home / Self Care | Attending: Gastroenterology

## 2019-04-14 ENCOUNTER — Ambulatory Visit (HOSPITAL_COMMUNITY)
Admission: RE | Admit: 2019-04-14 | Discharge: 2019-04-14 | Disposition: A | Discharge status: Home / Self Care | Payer: Managed Care, Other (non HMO) | Attending: Gastroenterology | Admitting: Gastroenterology

## 2019-04-14 ENCOUNTER — Encounter (HOSPITAL_COMMUNITY): Payer: Self-pay | Admitting: *Deleted

## 2019-04-14 DIAGNOSIS — K7010 Alcoholic hepatitis without ascites: Secondary | ICD-10-CM | POA: Diagnosis not present

## 2019-04-14 DIAGNOSIS — Z87891 Personal history of nicotine dependence: Secondary | ICD-10-CM | POA: Insufficient documentation

## 2019-04-14 DIAGNOSIS — K2970 Gastritis, unspecified, without bleeding: Secondary | ICD-10-CM | POA: Diagnosis not present

## 2019-04-14 DIAGNOSIS — K2950 Unspecified chronic gastritis without bleeding: Secondary | ICD-10-CM | POA: Insufficient documentation

## 2019-04-14 DIAGNOSIS — B9681 Helicobacter pylori [H. pylori] as the cause of diseases classified elsewhere: Secondary | ICD-10-CM | POA: Insufficient documentation

## 2019-04-14 DIAGNOSIS — K7030 Alcoholic cirrhosis of liver without ascites: Secondary | ICD-10-CM | POA: Diagnosis not present

## 2019-04-14 DIAGNOSIS — K3189 Other diseases of stomach and duodenum: Secondary | ICD-10-CM | POA: Insufficient documentation

## 2019-04-14 DIAGNOSIS — Z79899 Other long term (current) drug therapy: Secondary | ICD-10-CM | POA: Diagnosis not present

## 2019-04-14 DIAGNOSIS — K766 Portal hypertension: Secondary | ICD-10-CM | POA: Insufficient documentation

## 2019-04-14 DIAGNOSIS — I8510 Secondary esophageal varices without bleeding: Secondary | ICD-10-CM | POA: Diagnosis present

## 2019-04-14 HISTORY — PX: ESOPHAGOGASTRODUODENOSCOPY (EGD) WITH PROPOFOL: SHX5813

## 2019-04-14 HISTORY — PX: BIOPSY: SHX5522

## 2019-04-14 SURGERY — ESOPHAGOGASTRODUODENOSCOPY (EGD) WITH PROPOFOL
Anesthesia: General

## 2019-04-14 MED ORDER — LACTATED RINGERS IV SOLN
Freq: Once | INTRAVENOUS | Status: AC
  Administered 2019-04-14: 14:00:00 via INTRAVENOUS

## 2019-04-14 MED ORDER — LIDOCAINE VISCOUS HCL 2 % MT SOLN
OROMUCOSAL | Status: AC
  Filled 2019-04-14: qty 15

## 2019-04-14 MED ORDER — CHLORHEXIDINE GLUCONATE CLOTH 2 % EX PADS: 6.0000 | MEDICATED_PAD | Freq: Once | CUTANEOUS | Status: DC

## 2019-04-14 MED ORDER — LIDOCAINE VISCOUS HCL 2 % MT SOLN
5.0000 mL | Freq: Once | OROMUCOSAL | Status: AC
  Administered 2019-04-14: 15:00:00 5 mL via OROMUCOSAL

## 2019-04-14 MED ORDER — PROPOFOL 500 MG/50ML IV EMUL
INTRAVENOUS | Status: DC | PRN
  Administered 2019-04-14: 150 ug/kg/min via INTRAVENOUS

## 2019-04-14 MED ORDER — KETAMINE HCL 10 MG/ML IJ SOLN
INTRAMUSCULAR | Status: DC | PRN
  Administered 2019-04-14: 20 mg via INTRAVENOUS

## 2019-04-14 MED ORDER — KETAMINE HCL 50 MG/5ML IJ SOSY
PREFILLED_SYRINGE | INTRAMUSCULAR | Status: AC
  Filled 2019-04-14: qty 5

## 2019-04-14 MED ORDER — LACTATED RINGERS IV SOLN
INTRAVENOUS | Status: DC | PRN
  Administered 2019-04-14: 15:00:00 via INTRAVENOUS

## 2019-04-14 MED ORDER — PROPOFOL 10 MG/ML IV BOLUS
INTRAVENOUS | Status: DC | PRN
  Administered 2019-04-14 (×2): 20 mg via INTRAVENOUS

## 2019-04-14 NOTE — Op Note (Signed)
[facility] Patient Name: Antonio [name] Procedure Date: 04/14/2019 2:46 PM MRN: [id number] Date of Birth: 1970-08-08 Attending MD: [name] MD, MD CSN: [id number] Age: 48 Admit Type: Outpatient Procedure:                Upper GI endoscopy WITH COLD FORCEPS BIOPSY Indications:              1st degree variceal surveillance (known small                            varices, no prior bleeding)-NOV 2020 CHILD PUGH                            (B)/MELD SCORE 13. Providers:                [name] MD, MD, Charlsie Quest. Theda Sers RN, RN,                            Nelma Rothman, Technician Referring MD:             [name], MD Medicines:                Propofol per Anesthesia Complications:            No immediate complications.MILD OOZING AFTER                            BIOPSIES. Estimated Blood Loss:     Estimated blood loss: Minimal. Estimated blood loss                            was minimal. Procedure:                Pre-Anesthesia Assessment:                           - Prior to the procedure, a History and Physical                            was performed, and patient medications and                            allergies were reviewed. The patient's tolerance of                            previous anesthesia was also reviewed. The risks                            and benefits of the procedure and the sedation                            options and risks were discussed with the patient.                            All questions were answered, and informed consent  was obtained. Prior Anticoagulants: The patient has                            taken no previous anticoagulant or antiplatelet                            agents. ASA Grade Assessment: II - A patient with                            mild systemic disease. After reviewing the risks                            and benefits, the patient was deemed in                            satisfactory condition  to undergo the procedure.                            After obtaining informed consent, the endoscope was                            passed under direct vision. Throughout the                            procedure, the patient's blood pressure, pulse, and                            oxygen saturations were monitored continuously. The                            [id number]) scope was introduced through the                            mouth, and advanced to the second part of duodenum.                            The upper GI endoscopy was accomplished without                            difficulty. The patient tolerated the procedure                            well. Scope In: 3:17:58 PM Scope Out: 3:29:07 PM Total Procedure Duration: 0 hours 11 minutes 9 seconds  Findings:      Four columns of grade I, grade II varices with no bleeding and no       stigmata of recent bleeding were found in the lower third of the       esophagus,. No red wale signs were present.      Moderate portal hypertensive gastropathy was found in the cardia, in the       gastric fundus and in the gastric body.      Localized moderate inflammation characterized by congestion (edema),       erosions and erythema was found in the gastric  antrum.       Biopsies(2;BODY,1;INCISURA,2:ANTRUM) were taken with a cold forceps for       Helicobacter pylori testing.      The examined duodenum was normal. Impression:               - Grade I and grade II esophageal varices with no                            bleeding and no stigmata of recent bleeding.                           - MODERATE Portal hypertensive gastropathy.                           - MILD EROSIVE Gastritis. Biopsied. Moderate Sedation:      Per Anesthesia Care Recommendation:           - Patient has a contact number available for                            emergencies. The signs and symptoms of potential                            delayed complications were  discussed with the                            patient. Return to normal activities tomorrow.                            Written discharge instructions were provided to the                            patient.                           - Resume previous diet.                           - Continue present medications. CONTINUE NADOLOL                            AND XIFAXAN.                           - Await pathology results.                           - Return to GI office in 4 months. Procedure Code(s):        --- Professional ---                           [phone], Esophagogastroduodenoscopy, flexible,                            transoral; with biopsy, single or multiple Diagnosis Code(s):        --- Professional ---  I85.00, Esophageal varices without bleeding                           K76.6, Portal hypertension                           K31.89, Other diseases of stomach and duodenum                           K29.70, Gastritis, unspecified, without bleeding CPT copyright 2019 American Medical Association. All rights reserved. The codes documented in this report are preliminary and upon coder review may  be revised to meet current compliance requirements. [name], MD [name] MD, MD 04/14/2019 3:41:45 PM This report has been signed electronically. Number of Addenda: 0

## 2019-04-14 NOTE — H&P (Signed)
Primary Care Physician:  Deloria Lair., MD Primary Gastroenterologist:  Dr. Oneida Alar  Pre-Procedure History & Physical: HPI:  Antonio [name] is a 48 y.o. male here for [place].  Past Medical History:  Diagnosis Date  . Alcohol abuse   . Alcoholic hepatitis   . 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 History of gout     Past Surgical History:  Procedure Laterality Date  . BIOPSY  03/08/2016   Procedure: BIOPSY;  Surgeon: Daneil Dolin, MD;  [facility];  Service: Endoscopy;;  descending colon biopsies  . Colonoscopy     per patient around 2014 in [place]   . COLONOSCOPY WITH PROPOFOL [name] 03/08/2016   Procedure: COLONOSCOPY WITH PROPOFOL;  Surgeon: Daneil Dolin, MD;  [facility];  Service: Endoscopy;  Laterality: [name];  . ESOPHAGOGASTRODUODENOSCOPY (EGD) WITH PROPOFOL [name] 12/28/2015   Dr. Oneida Alar: 4 columns of esophageal varices, 3 grade 1, 1 grade 2 with no evidence of bleeding. Mild portal gastropathy with no evidence of active bleeding.  . ESOPHAGOGASTRODUODENOSCOPY (EGD) WITH PROPOFOL [name] 03/08/2016   Procedure: ESOPHAGOGASTRODUODENOSCOPY (EGD) WITH PROPOFOL;  Surgeon: Daneil Dolin, MD;  [facility];  Service: Endoscopy;  Laterality: [name];  . GIVENS CAPSULE STUDY [name] 07/05/2017   Procedure: GIVENS CAPSULE STUDY;  Surgeon: Danie Binder, MD;  [facility];  Service: Endoscopy;  Laterality: [name];  7:30am  . GIVENS CAPSULE STUDY [name] 08/05/2017   Procedure: GIVENS CAPSULE STUDY;  Surgeon: Danie Binder, MD;  [facility];  Service: Endoscopy;  Laterality: [name];  7:30am  . POLYPECTOMY  03/08/2016   Procedure: POLYPECTOMY;  Surgeon: Daneil Dolin, MD;  [facility];  Service: Endoscopy;;  cecum and descending     Prior to Admission medications   Medication Sig Start Date End Date Taking? Authorizing Provider  Calcium Carb-Cholecalciferol (CALCIUM 600+D) 600-800 MG-UNIT TABS Take 1 tablet by mouth 2 (two)  times daily.    Yes [provider]  lactulose, encephalopathy, ([place]) 10 GM/15ML SOLN TAKE 30 ML BY MOUTH THREE TIMES DAILY Patient taking differently: as needed.  09/22/18  Yes Annitta Needs, NP  nadolol (CORGARD) 20 MG tablet TAKE 1 TABLET BY MOUTH TWICE A DAY 06/18/18  Yes Mahala Menghini, PA-C  pantoprazole (PROTONIX) 40 MG tablet TAKE ONE TABLET BY MOUTH ONCE DAILY IN THE MORNING BEFORE BREAKFAST 02/26/19  Yes Mahala Menghini, PA-C  torsemide (DEMADEX) 20 MG tablet TAKE 1 TABLET BY MOUTH DAILY TAKE 1/2 TABLE WHEN PATIENT HAS EXTRA SWELLING 08/29/18  Yes Carlis Stable, NP  rifaximin (XIFAXAN) 550 MG TABS tablet Take 1 tablet (550 mg total) by mouth 2 (two) times daily. Patient not taking: Reported on 07/23/2018 05/08/16   Danie Binder, MD    Allergies as of 02/19/2019  . (No Known Allergies)    Family History  Problem Relation Age of Onset  . Colon cancer Neg Hx   . Liver disease Neg Hx     Social History   Socioeconomic History  . Marital status: Married    Spouse name: Not on file  . Number of children: Not on file  . Years of education: Not on file  . Highest education level: Not on file  Occupational History  . Not on file  Social Needs  . Financial resource strain: Not on file  . Food insecurity    Worry: Not on file    Inability: Not on file  .  Transportation needs    Medical: Not on file    Non-medical: Not on file  Tobacco Use  . Smoking status: Former Smoker    Packs/day: 0.25    Types: Cigarettes    Quit date: 2016    Years since quitting: 4.8  . Smokeless tobacco: Never Used  Substance and Sexual Activity  . Alcohol use: No    Comment: former--quit after 12/2015 hospitalization  . Drug use: No  . Sexual activity: Yes  Lifestyle  . Physical activity    Days per week: Not on file    Minutes per session: Not on file  . Stress: Not on file  Relationships  . Social Herbalist on phone: Not on file    Gets together: Not on file     Attends religious service: Not on file    Active member of club or organization: Not on file    Attends meetings of clubs or organizations: Not on file    Relationship status: Not on file  . Intimate partner violence    Fear of current or ex partner: Not on file    Emotionally abused: Not on file    Physically abused: Not on file    Forced sexual activity: Not on file  Other Topics Concern  . Not on file  Social History Narrative   MARRIED. 2 KIDS-7 GRANDBABIES. SPENDS FREE TIME: WITH GRANDCHILDREN, AND WATCHING TV.    Review of Systems: See HPI, otherwise negative ROS   Physical Exam: Pulse 83   Resp 18   SpO2 98%  General:   Alert,  pleasant and cooperative in NAD Head:  Normocephalic and atraumatic. Neck:  Supple; Lungs:  Clear throughout to auscultation.    Heart:  Regular rate and rhythm. Abdomen:  Soft, nontender and nondistended. Normal bowel sounds, without guarding, and without rebound.   Neurologic:  Alert and  oriented x4;  grossly normal neurologically.  Impression/Plan:     SCREENING VARICES  PLAN:  1.EGD TODAY.  DISCUSSED PROCEDURE, BENEFITS, & RISKS: < 1% chance of medication reaction, bleeding, perforation, or ASPIRATION.

## 2019-04-14 NOTE — Discharge Instructions (Addendum)
You have esophageal varices, BULGING VEINS IN THE ESOPHAGUS DUE TO SCARRING IN YOUR LIVER. YOU HAVE GASTRITIS. I BIOPSIED YOUR STOMACH.   CONTINUE XIFAXAN AND NADOLOL.  YOUR BIOPSY RESULTS WILL BE BACK IN 5 BUSINESS DAYS.  FOLLOW UP IN [facility] 2021.   UPPER ENDOSCOPY AFTER CARE Read the instructions outlined below and refer to this sheet in the next week. These discharge instructions provide you with general information on caring for yourself after you leave the hospital. While your treatment has been planned according to the most current medical practices available, unavoidable complications occasionally occur. If you have any problems or questions after discharge, call DR. Terika Pillard, [phone].  ACTIVITY  You may resume your regular activity, but move at a slower pace for the next 24 hours.   Take frequent rest periods for the next 24 hours.   Walking will help get rid of the air and reduce the bloated feeling in your belly (abdomen).   No driving for 24 hours (because of the medicine (anesthesia) used during the test).   You may shower.   Do not sign any important legal documents or operate any machinery for 24 hours (because of the anesthesia used during the test).    NUTRITION  Drink plenty of fluids.   You may resume your normal diet as instructed by your doctor.   Begin with a light meal and progress to your normal diet. Heavy or fried foods are harder to digest and may make you feel sick to your stomach (nauseated).   Avoid alcoholic beverages for 24 hours or as instructed.    MEDICATIONS  You may resume your normal medications.   WHAT YOU CAN EXPECT TODAY  Some feelings of bloating in the abdomen.   Passage of more gas than usual.    IF YOU HAD A BIOPSY TAKEN DURING THE UPPER ENDOSCOPY:  Eat a soft diet IF YOU HAVE NAUSEA, BLOATING, ABDOMINAL PAIN, OR VOMITING.    FINDING OUT THE RESULTS OF YOUR TEST Not all test results are available during your visit.  DR. Oneida Alar WILL CALL YOU WITHIN 7 DAYS OF YOUR PROCEDUE WITH YOUR RESULTS. Do not assume everything is normal if you have not heard from DR. Issaih Kaus IN ONE WEEK, CALL HER OFFICE AT [phone].  SEEK IMMEDIATE MEDICAL ATTENTION AND CALL THE OFFICE: [phone] IF:  You have more than a spotting of blood in your stool.   Your belly is swollen (abdominal distention).   You are nauseated or vomiting.   You have a temperature over 101F.   You have abdominal pain or discomfort that is severe or gets worse throughout the day.  Gastritis  Gastritis is an inflammation (the body's way of reacting to injury and/or infection) of the stomach. It is often caused by viral or bacterial (germ) infections. It can also be caused BY ASPIRIN, BC/GOODY POWDER'S, (IBUPROFEN) MOTRIN, OR ALEVE (NAPROXEN), chemicals (including alcohol), SPICY FOODS, and medications. This illness may be associated with generalized malaise (feeling tired, not well), UPPER ABDOMINAL STOMACH cramps, and fever. One common bacterial cause of gastritis is an organism known as H. Pylori. This can be treated with antibiotics.

## 2019-04-14 NOTE — Anesthesia Postprocedure Evaluation (Signed)
Anesthesia Post Note  Patient: Antonio [name]  Procedure(s) Performed: ESOPHAGOGASTRODUODENOSCOPY (EGD) WITH PROPOFOL ([name] ) BIOPSY  Patient location during evaluation: PACU Anesthesia Type: General Level of consciousness: awake and alert and oriented Pain management: pain level controlled Vital Signs Assessment: post-procedure vital signs reviewed and stable Respiratory status: spontaneous breathing Cardiovascular status: blood pressure returned to baseline and stable Postop Assessment: no apparent nausea or vomiting Anesthetic complications: no     Last Vitals:  Vitals:   04/14/19 1325 04/14/19 1538  Pulse: 83   Resp: 18   Temp:  36.6 C  SpO2: 98% 100%    Last Pain:  Vitals:   04/14/19 1538  TempSrc:   PainSc: 0-No pain                 Abimael Zeiter

## 2019-04-14 NOTE — Transfer of Care (Signed)
Immediate Anesthesia Transfer of Care Note  Patient: Antonio [name]  Procedure(s) Performed: ESOPHAGOGASTRODUODENOSCOPY (EGD) WITH PROPOFOL ([name] ) BIOPSY  Patient Location: PACU  Anesthesia Type:General  Level of Consciousness: awake  Airway & Oxygen Therapy: Patient Spontanous Breathing  Post-op Assessment: Report given to RN  Post vital signs: Reviewed and stable  Last Vitals:  Vitals Value Taken Time  BP 129/81 04/14/19 1536  Temp    Pulse    Resp 12 04/14/19 1540  SpO2    Vitals shown include unvalidated device data.  Last Pain:  Vitals:   04/14/19 1442  TempSrc:   PainSc: 0-No pain         Complications: No apparent anesthesia complications

## 2019-04-14 NOTE — Anesthesia Preprocedure Evaluation (Signed)
Anesthesia Evaluation  Patient identified by MRN, date of birth, ID band Patient awake    Reviewed: Allergy & Precautions, NPO status , Patient's Chart, lab work & pertinent test results  Airway Mallampati: III  TM Distance: >3 FB Neck ROM: Full    Dental  (+) Missing, Dental Advisory Given   Pulmonary former smoker,    Pulmonary exam normal        Cardiovascular Exercise Tolerance: Good +CHF  Normal cardiovascular exam Rhythm:Regular Rate:Normal  Study Conclusions  - Left ventricle: The cavity size was normal. Wall thickness was   increased in a pattern of moderate LVH. Systolic function was   normal. The estimated ejection fraction was in the range of 60%   to 65%. Diastolic function is abnormal, indeterminate grade. Wall   motion was normal; there were no regional wall motion   abnormalities. - Aortic valve: Valve area (VTI): 3.9 cm^2. Valve area (Vmax): 3.74   cm^2. Valve area (Vmean): 4.11 cm^2. - Left atrium: The atrium was mildly dilated. - Technically adequate study.    Neuro/Psych Seizures - (patient denied h/o seizures),  PSYCHIATRIC DISORDERS    GI/Hepatic (+) Cirrhosis   Esophageal Varices  substance abuse (quit alcohol 4 years ago)  alcohol use, Hepatitis - (alcoholic?), Toxin Related  Endo/Other  Morbid obesity  Renal/GU      Musculoskeletal negative musculoskeletal ROS (+)   Abdominal (+) + obese,   Peds  Hematology  (+) anemia ,   Anesthesia Other Findings   Reproductive/Obstetrics negative OB ROS                            Anesthesia Physical Anesthesia Plan  ASA: III  Anesthesia Plan: General   Post-op Pain Management:    Induction: Intravenous  PONV Risk Score and Plan: TIVA  Airway Management Planned: Nasal Cannula, Natural Airway and Simple Face Mask  Additional Equipment:   Intra-op Plan:   Post-operative Plan:   Informed Consent: I have  reviewed the patients History and Physical, chart, labs and discussed the procedure including the risks, benefits and alternatives for the proposed anesthesia with the patient or authorized representative who has indicated his/her understanding and acceptance.     Dental advisory given  Plan Discussed with: CRNA  Anesthesia Plan Comments:         Anesthesia Quick Evaluation

## 2019-04-16 ENCOUNTER — Telehealth: Payer: Self-pay | Admitting: Gastroenterology

## 2019-04-16 LAB — SURGICAL PATHOLOGY

## 2019-04-16 MED ORDER — NADOLOL 20 MG PO TABS: 20.0000 mg | ORAL_TABLET | Freq: Two times a day (BID) | ORAL | 3 refills | Status: DC

## 2019-04-16 MED ORDER — RIFAXIMIN 550 MG PO TABS: 550.0000 mg | ORAL_TABLET | Freq: Two times a day (BID) | ORAL | 3 refills | Status: DC

## 2019-04-16 NOTE — Telephone Encounter (Signed)
Completed.

## 2019-04-16 NOTE — Telephone Encounter (Signed)
Forwarding to refill box.  

## 2019-04-16 NOTE — Telephone Encounter (Signed)
Pt's wife called to say that patient needed corgard and xifaxin called into [facility] in [place]. [phone]

## 2019-04-17 ENCOUNTER — Encounter (HOSPITAL_COMMUNITY): Payer: Self-pay | Admitting: Gastroenterology

## 2019-04-21 ENCOUNTER — Telehealth: Payer: Self-pay | Admitting: Gastroenterology

## 2019-04-21 MED ORDER — CLARITHROMYCIN 500 MG PO TABS: ORAL_TABLET | ORAL | 0 refills | Status: DC

## 2019-04-21 MED ORDER — AMOXICILLIN 500 MG PO TABS: ORAL_TABLET | ORAL | 0 refills | Status: DC

## 2019-04-21 NOTE — Telephone Encounter (Signed)
THE STOMACH BIOPSIES showed H. Pylori infection. YOU AMOXICILLIN 500 mg 2 po BID for 10 days and Biaxin 500 mg po bid for 10 days. YOU SHOULD INCREASE PROTONIX TO TWICE DAILY WHILE TAKING THE ANTIBIOTICS. TAKE ALL THE ABX AS PRESCRIBED. H PYLORI IF LEFT UNTREATED CAN LEAD TO STOMACH CANCER. Med side effects include NAUSEA, VOMITING, DIARRHEA, ABDOMINAL pain, and metallic taste.   CONTINUE XIFAXAN AND NADOLOL. FOLLOW UP IN [facility] 2021, [id number], H PYLORI GASTRITIS.

## 2019-04-21 NOTE — Telephone Encounter (Signed)
ON RECALL  °

## 2019-04-22 NOTE — Progress Notes (Signed)
cc'd to pcp 

## 2019-04-28 ENCOUNTER — Telehealth: Payer: Self-pay | Admitting: Gastroenterology

## 2019-04-28 MED ORDER — RIFAXIMIN 550 MG PO TABS: 550.0000 mg | ORAL_TABLET | Freq: Two times a day (BID) | ORAL | 3 refills | Status: DC

## 2019-04-28 NOTE — Telephone Encounter (Signed)
[place], SHE HAS A QUESTION ABOUT THE PATIENT Antonio [name], THINKING ABOUT CHANGING INSURANCE AND WANTED TO KNOW HOW LONG HE MAY BE STAYING ON THAT MEDICATION

## 2019-04-28 NOTE — Telephone Encounter (Signed)
Dr. Oneida Alar please advise!

## 2019-04-28 NOTE — Addendum Note (Signed)
Addended by: Danie Binder on: 04/28/2019 02:38 PM   Modules accepted: Orders

## 2019-04-28 NOTE — Telephone Encounter (Signed)
Called patient TO DISCUSS CONCERNS. Spoke to pt and TOLD HIM HE NEEDS XIFAXAN FOR AT LEAST THE NEXT YEAR. 3 MO SUPPLY SENT WITH REFILLS x3

## 2019-05-04 ENCOUNTER — Telehealth: Payer: Self-pay

## 2019-05-04 NOTE — Telephone Encounter (Signed)
I have started a PA for Xifaxan.

## 2019-05-04 NOTE — Telephone Encounter (Signed)
Pt's wife, Meredith Mody, is aware.

## 2019-05-04 NOTE — Telephone Encounter (Signed)
I received approval on the Xifaxan for 05/04/2019 through 05/03/2020 as long as there are no changes to his plan.   Tried to call pt to let him know and could not leave a VM.

## 2019-05-10 ENCOUNTER — Other Ambulatory Visit: Payer: Self-pay | Admitting: Nurse Practitioner

## 2019-05-12 MED ORDER — PANTOPRAZOLE SODIUM 40 MG PO TBEC: DELAYED_RELEASE_TABLET | ORAL | 3 refills | Status: DC

## 2019-06-09 ENCOUNTER — Telehealth: Payer: Self-pay

## 2019-06-09 NOTE — Telephone Encounter (Signed)
Tried to call pt's wife and number still busy.

## 2019-06-09 NOTE — Telephone Encounter (Signed)
The letter was mailed for them to call if we are unable to reach them.

## 2019-06-09 NOTE — Telephone Encounter (Signed)
PLEASE CALL PT. Unless he's having side effects related to NADOLOL, HE SHOULD CONTINUE THE NADOLOL.

## 2019-06-09 NOTE — Telephone Encounter (Signed)
LMOM to call.

## 2019-06-09 NOTE — Telephone Encounter (Signed)
I received a note from [facility] that pt is not taking the Nadolol 20 mg bid.  Asked [place] to discuss with pt why he is not taking.   [facility] [place] [facility] [place] and he last got 90 days supply for bid in August 2020.  I called [facility] and spoke to [facility] and he has only gotten Nadalol there once on Apr 21, 2019, a 30 day supply.   I called pt and left VM for a return call @ [phone].  I called his wife Meredith Mody @ [phone], busy signal and her other number [phone] is not in service.  Will mail a letter for them to call and discuss.

## 2019-06-10 NOTE — Telephone Encounter (Signed)
REVIEWED. AGREE. NO ADDITIONAL RECOMMENDATIONS. 

## 2019-06-10 NOTE — Telephone Encounter (Signed)
LMOM to call I have a very important message for him from Dr. Oneida Alar.

## 2019-06-10 NOTE — Telephone Encounter (Signed)
Pt's wife, Meredith Mody, called and said that he is taking the Nadolol. She said Dr. Oneida Alar took him off of it at one time and that is the reason he had some on hand. When she told him to go back on it, he has been taking it every since.

## 2019-07-06 ENCOUNTER — Encounter: Payer: Self-pay | Admitting: Gastroenterology

## 2019-07-08 ENCOUNTER — Telehealth: Payer: Self-pay | Admitting: Gastroenterology

## 2019-07-08 NOTE — Telephone Encounter (Signed)
Letter mailed

## 2019-07-08 NOTE — Telephone Encounter (Signed)
Recall for ct °

## 2019-08-20 ENCOUNTER — Ambulatory Visit: Payer: Medicare Other | Admitting: Gastroenterology

## 2019-08-21 ENCOUNTER — Other Ambulatory Visit: Payer: Self-pay | Admitting: Gastroenterology

## 2019-08-26 ENCOUNTER — Other Ambulatory Visit: Payer: Self-pay

## 2019-08-26 ENCOUNTER — Encounter: Payer: Self-pay | Admitting: Gastroenterology

## 2019-08-26 ENCOUNTER — Ambulatory Visit (INDEPENDENT_AMBULATORY_CARE_PROVIDER_SITE_OTHER): Payer: Managed Care, Other (non HMO) | Admitting: Gastroenterology

## 2019-08-26 DIAGNOSIS — K7030 Alcoholic cirrhosis of liver without ascites: Secondary | ICD-10-CM

## 2019-08-26 NOTE — Assessment & Plan Note (Addendum)
WELL COMPENSATED DISEASE. NO ETOH.  CONTINUE YOUR WEIGHT LOSS EFFORTS. USE LACTULOSE TO PREVENT CONSTIPATION. USE TORSEMIDE TO CONTROL SWELLING. CONTINUE XIFAXAN.  Complete ultrasound IN APR 2021 and OCT 2021. REPEAT UPPER ENDOSCOPY AS SOON AS NOV 2022 AND NO LATER THAN NOV 2023. FOLLOW UP IN OCT 2021.
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
## 2019-08-26 NOTE — Patient Instructions (Addendum)
CONTINUE YOUR WEIGHT LOSS EFFORTS.   USE LACTULOSE TO PREVENT CONSTIPATION.  USE TORSEMIDE TO CONTROL SWELLING.  CONTINUE XIFAXAN   Complete ultrasound IN APR 2021 and OCT 2021.  REPEAT UPPER ENDOSCOPY AS SOON AS NOV 2022 AND NO LATER THAN NOV 2023.  FOLLOW UP IN OCT 2021.

## 2019-09-09 ENCOUNTER — Other Ambulatory Visit: Payer: Self-pay

## 2019-09-09 ENCOUNTER — Ambulatory Visit (HOSPITAL_COMMUNITY)
Admission: RE | Admit: 2019-09-09 | Discharge: 2019-09-09 | Disposition: A | Discharge status: Home / Self Care | Payer: Managed Care, Other (non HMO) | Source: Ambulatory Visit | Attending: Gastroenterology | Admitting: Gastroenterology

## 2019-09-09 DIAGNOSIS — K7030 Alcoholic cirrhosis of liver without ascites: Secondary | ICD-10-CM | POA: Insufficient documentation

## 2019-09-10 ENCOUNTER — Other Ambulatory Visit: Payer: Self-pay | Admitting: *Deleted

## 2019-09-10 DIAGNOSIS — K8020 Calculus of gallbladder without cholecystitis without obstruction: Secondary | ICD-10-CM

## 2019-09-10 NOTE — Progress Notes (Signed)
Called lmom

## 2019-10-01 ENCOUNTER — Ambulatory Visit (INDEPENDENT_AMBULATORY_CARE_PROVIDER_SITE_OTHER): Payer: Managed Care, Other (non HMO) | Admitting: General Surgery

## 2019-10-01 ENCOUNTER — Encounter: Payer: Self-pay | Admitting: General Surgery

## 2019-10-01 ENCOUNTER — Other Ambulatory Visit: Payer: Self-pay

## 2019-10-01 VITALS — BP 113/77 | HR 53 | Temp 97.6°F | Resp 16 | Ht 74.0 in | Wt 356.0 lb

## 2019-10-01 DIAGNOSIS — K8020 Calculus of gallbladder without cholecystitis without obstruction: Secondary | ICD-10-CM | POA: Diagnosis not present

## 2019-10-01 NOTE — Patient Instructions (Signed)

## 2019-10-01 NOTE — Progress Notes (Signed)
Antonio [name]; [id number]; 1971-02-05   HPI Patient is a 49 year old black male who was referred to my care by Dr. Oneida Alar and Dr. Scotty Court for evaluation and treatment of cholelithiasis seen on recent ultrasound.  Patient has a history of alcoholic cirrhosis, though he has not been drinking for some time.  He states he is also actively trying to lose weight.  He denies any fatty food intolerance, fever, chills, nausea, or right upper quadrant abdominal pain.  Currently has 0 out of 10 abdominal pain. Past Medical History:  Diagnosis Date  . Alcohol abuse   . Alcoholic hepatitis   . 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 History of gout     Past Surgical History:  Procedure Laterality Date  . BIOPSY  03/08/2016   Procedure: BIOPSY;  Surgeon: Daneil Dolin, MD;  [facility];  Service: Endoscopy;;  descending colon biopsies  . BIOPSY  04/14/2019   Procedure: BIOPSY;  Surgeon: Danie Binder, MD;  [facility];  Service: Endoscopy;;  . Colonoscopy     per patient around 2014 in [place]   . COLONOSCOPY WITH PROPOFOL [name] 03/08/2016   Procedure: COLONOSCOPY WITH PROPOFOL;  Surgeon: Daneil Dolin, MD;  [facility];  Service: Endoscopy;  Laterality: [name];  . ESOPHAGOGASTRODUODENOSCOPY (EGD) WITH PROPOFOL [name] 12/28/2015   Dr. Oneida Alar: 4 columns of esophageal varices, 3 grade 1, 1 grade 2 with no evidence of bleeding. Mild portal gastropathy with no evidence of active bleeding.  . ESOPHAGOGASTRODUODENOSCOPY (EGD) WITH PROPOFOL [name] 03/08/2016   Procedure: ESOPHAGOGASTRODUODENOSCOPY (EGD) WITH PROPOFOL;  Surgeon: Daneil Dolin, MD;  [facility];  Service: Endoscopy;  Laterality: [name];  . ESOPHAGOGASTRODUODENOSCOPY (EGD) WITH PROPOFOL [name] 04/14/2019   Procedure: ESOPHAGOGASTRODUODENOSCOPY (EGD) WITH PROPOFOL;  Surgeon: Danie Binder, MD;  [facility];  Service: Endoscopy;  Laterality: [name];  2:45pm  . GIVENS CAPSULE STUDY [name] 07/05/2017   Procedure:  GIVENS CAPSULE STUDY;  Surgeon: Danie Binder, MD;  [facility];  Service: Endoscopy;  Laterality: [name];  7:30am  . GIVENS CAPSULE STUDY [name] 08/05/2017   Procedure: GIVENS CAPSULE STUDY;  Surgeon: Danie Binder, MD;  [facility];  Service: Endoscopy;  Laterality: [name];  7:30am  . POLYPECTOMY  03/08/2016   Procedure: POLYPECTOMY;  Surgeon: Daneil Dolin, MD;  [facility];  Service: Endoscopy;;  cecum and descending     Family History  Problem Relation Age of Onset  . Colon cancer Neg Hx   . Liver disease Neg Hx     Current Outpatient Medications on File Prior to Visit  Medication Sig Dispense Refill  . Calcium Carb-Cholecalciferol (CALCIUM 600+D) 600-800 MG-UNIT TABS Take 1 tablet by mouth 2 (two) times daily.     Marland Kitchen lactulose, encephalopathy, (CHRONULAC) 10 GM/15ML SOLN TAKE 30 ML BY MOUTH THREE TIMES DAILY (Patient taking differently: as needed. ) 1892 mL 5  . Multiple Vitamins-Minerals (ONCOVITE) TABS Take by mouth.    . nadolol (CORGARD) 20 MG tablet TAKE 1 TABLET BY MOUTH TWICE A DAY 180 tablet 3  . pantoprazole (PROTONIX) 40 MG tablet TAKE ONE TABLET BY MOUTH ONCE DAILY IN THE MORNING BEFORE BREAKFAST 90 tablet 3  . rifaximin (XIFAXAN) 550 MG TABS tablet Take 1 tablet (550 mg total) by mouth 2 (two) times daily. 180 tablet 3  . thiamine 50 MG tablet Take by mouth.    . torsemide (DEMADEX) 20 MG tablet TAKE 1 TABLET BY  MOUTH DAILY TAKE 1/2 TABLE WHEN PATIENT HAS EXTRA SWELLING 135 tablet 1   No current facility-administered medications on file prior to visit.    No Known Allergies  Social History   Substance and Sexual Activity  Alcohol Use No   Comment: former--quit after 12/2015 hospitalization    Social History   Tobacco Use  Smoking Status Former Smoker  . Packs/day: 0.25  . Types: Cigarettes  . Quit date: 2016  . Years since quitting: 5.3  Smokeless Tobacco Never Used    Review of Systems  Constitutional: Positive for  malaise/fatigue.  HENT: Negative.   Eyes: Negative.   Respiratory: Negative.   Cardiovascular: Negative.   Gastrointestinal: Positive for abdominal pain and heartburn.  Genitourinary: Positive for urgency.  Musculoskeletal: Positive for back pain.  Skin: Negative.   Neurological: Positive for sensory change.  Endo/Heme/Allergies: Negative.   Psychiatric/Behavioral: Negative.     Objective   Vitals:   10/01/19 1005  BP: 113/77  Pulse: (!) 53  Resp: 16  Temp: 97.6 F (36.4 C)  SpO2: 98%    Physical Exam Vitals reviewed.  Constitutional:      Appearance: Normal appearance. He is obese. He is not ill-appearing.  Cardiovascular:     Rate and Rhythm: Normal rate and regular rhythm.     Heart sounds: Normal heart sounds. No murmur. No friction rub. No gallop.   Pulmonary:     Effort: Pulmonary effort is normal. No respiratory distress.     Breath sounds: Normal breath sounds. No stridor. No wheezing, rhonchi or rales.  Abdominal:     General: Bowel sounds are normal. There is no distension.     Palpations: Abdomen is soft. There is no mass.     Tenderness: There is no abdominal tenderness. There is no guarding or rebound.     Hernia: No hernia is present.  Skin:    General: Skin is warm and dry.  Neurological:     Mental Status: He is alert and oriented to person, place, and time.   Ultrasound report reviewed, previous GI notes reviewed  Assessment  Cholelithiasis, currently asymptomatic.  Patient has stigmata of portal hypertension and cirrhosis.  This makes any surgical intervention at increased risk. Plan   The signs and symptoms of cholecystitis were explained to the patient.  I told him that I would defer any surgical intervention at this time as he is asymptomatic.  He understands this and agrees.  Follow-up as needed.

## 2019-11-03 ENCOUNTER — Other Ambulatory Visit: Payer: Self-pay | Admitting: Gastroenterology

## 2020-01-27 ENCOUNTER — Telehealth: Payer: Self-pay | Admitting: Internal Medicine

## 2020-01-27 NOTE — Telephone Encounter (Signed)
Letter mailed

## 2020-01-27 NOTE — Telephone Encounter (Signed)
RECALL FOR ULTRASOUND 

## 2020-04-22 ENCOUNTER — Other Ambulatory Visit: Payer: Self-pay | Admitting: Gastroenterology

## 2020-07-07 ENCOUNTER — Telehealth: Payer: Self-pay | Admitting: *Deleted

## 2020-07-07 NOTE — Telephone Encounter (Signed)
Received paper refill request for xifaxin 550mg  tablets #180, 1 tablet by mouth 2 times daily

## 2020-07-08 ENCOUNTER — Telehealth: Payer: Self-pay

## 2020-07-08 ENCOUNTER — Encounter: Payer: Self-pay | Admitting: Internal Medicine

## 2020-07-08 MED ORDER — RIFAXIMIN 550 MG PO TABS: 550.0000 mg | ORAL_TABLET | Freq: Two times a day (BID) | ORAL | 3 refills | Status: DC

## 2020-07-08 NOTE — Telephone Encounter (Signed)
Phoned the pt and advised him to bring his updated insurance to the office next week because I cannot do a PA for his Xifaxan 550 mg tab. Pt agreed to do so.

## 2020-07-08 NOTE — Telephone Encounter (Signed)
Refilled but needs office visit.  °

## 2020-07-08 NOTE — Addendum Note (Signed)
Addended by: Annitta Needs on: 07/08/2020 10:59 AM   Modules accepted: Orders

## 2020-07-11 ENCOUNTER — Telehealth: Payer: Self-pay | Admitting: Internal Medicine

## 2020-07-11 NOTE — Telephone Encounter (Signed)
Pt's wife called to say that [facility]'t [facility] and the auth # was [id number] and fax # [phone]. Any questions call her at [phone]

## 2020-07-11 NOTE — Telephone Encounter (Signed)
Phoned the pt's wife back regarding pt's authorization for Xifaxin and that it had to do with pt's insurance we had on file.  2. Did another referral on the pt using different authorization form that took his insurance we had on file. Waiting on response.

## 2020-07-14 ENCOUNTER — Other Ambulatory Visit: Payer: Self-pay

## 2020-07-14 ENCOUNTER — Telehealth: Payer: Self-pay

## 2020-07-14 DIAGNOSIS — K7030 Alcoholic cirrhosis of liver without ascites: Secondary | ICD-10-CM

## 2020-07-14 MED ORDER — RIFAXIMIN 550 MG PO TABS: 550.0000 mg | ORAL_TABLET | Freq: Two times a day (BID) | ORAL | 3 refills | Status: DC

## 2020-07-14 NOTE — Telephone Encounter (Signed)
Notice of approval from [facility] on 07/14/2020. Xifaxan 550 mg approved from 07/14/2020 through 07/14/2021.  As long as the pt remain on his Rx drug plan and there are no changes to this plan.   Approvals may be limited  to dosing limits, by indication, by national drug code, by any other limitations imposed by your plan.

## 2020-07-14 NOTE — Progress Notes (Signed)
error 

## 2020-07-14 NOTE — Progress Notes (Signed)
Pt's medication sent to [facility] in [place]. Prior approval already in the chart. Pt aware

## 2020-11-04 ENCOUNTER — Other Ambulatory Visit: Payer: Self-pay | Admitting: Nurse Practitioner

## 2020-11-07 ENCOUNTER — Encounter (HOSPITAL_COMMUNITY): Payer: Self-pay | Admitting: Adult Health

## 2020-11-10 ENCOUNTER — Encounter (HOSPITAL_COMMUNITY): Payer: Self-pay | Admitting: Adult Health

## 2020-11-16 ENCOUNTER — Ambulatory Visit: Payer: Managed Care, Other (non HMO) | Admitting: Internal Medicine

## 2020-11-16 ENCOUNTER — Encounter: Payer: Self-pay | Admitting: Internal Medicine

## 2021-02-02 ENCOUNTER — Encounter: Payer: Self-pay | Admitting: *Deleted

## 2021-03-01 ENCOUNTER — Other Ambulatory Visit: Payer: Self-pay | Admitting: Gastroenterology

## 2021-05-22 ENCOUNTER — Other Ambulatory Visit: Payer: Self-pay | Admitting: Nurse Practitioner

## 2021-05-23 ENCOUNTER — Emergency Department (HOSPITAL_COMMUNITY)
Admission: EM | Admit: 2021-05-23 | Discharge: 2021-05-23 | Disposition: A | Discharge status: Home / Self Care | Payer: Managed Care, Other (non HMO) | Attending: Emergency Medicine | Admitting: Emergency Medicine

## 2021-05-23 ENCOUNTER — Encounter (HOSPITAL_COMMUNITY): Payer: Self-pay | Admitting: Adult Health

## 2021-05-23 ENCOUNTER — Emergency Department (HOSPITAL_COMMUNITY): Payer: Managed Care, Other (non HMO)

## 2021-05-23 DIAGNOSIS — M25571 Pain in right ankle and joints of right foot: Secondary | ICD-10-CM | POA: Insufficient documentation

## 2021-05-23 DIAGNOSIS — S8991XA Unspecified injury of right lower leg, initial encounter: Secondary | ICD-10-CM | POA: Diagnosis present

## 2021-05-23 DIAGNOSIS — S82851A Displaced trimalleolar fracture of right lower leg, initial encounter for closed fracture: Secondary | ICD-10-CM

## 2021-05-23 DIAGNOSIS — Y92007 Garden or yard of unspecified non-institutional (private) residence as the place of occurrence of the external cause: Secondary | ICD-10-CM | POA: Diagnosis not present

## 2021-05-23 DIAGNOSIS — W010XXA Fall on same level from slipping, tripping and stumbling without subsequent striking against object, initial encounter: Secondary | ICD-10-CM | POA: Diagnosis not present

## 2021-05-23 DIAGNOSIS — Z87891 Personal history of nicotine dependence: Secondary | ICD-10-CM | POA: Insufficient documentation

## 2021-05-23 DIAGNOSIS — I509 Heart failure, unspecified: Secondary | ICD-10-CM | POA: Diagnosis not present

## 2021-05-23 MED ORDER — ETOMIDATE 2 MG/ML IV SOLN
10.0000 mg | Freq: Once | INTRAVENOUS | Status: AC
  Administered 2021-05-23: 19:00:00 10 mg via INTRAVENOUS
  Filled 2021-05-23: qty 10

## 2021-05-23 MED ORDER — ONDANSETRON HCL 4 MG/2ML IJ SOLN
4.0000 mg | Freq: Once | INTRAMUSCULAR | Status: AC
  Administered 2021-05-23: 18:00:00 4 mg via INTRAVENOUS
  Filled 2021-05-23: qty 2

## 2021-05-23 MED ORDER — OXYCODONE-ACETAMINOPHEN 5-325 MG PO TABS
1.0000 | ORAL_TABLET | Freq: Once | ORAL | Status: AC
  Administered 2021-05-23: 21:00:00 1 via ORAL
  Filled 2021-05-23: qty 1

## 2021-05-23 MED ORDER — OXYCODONE-ACETAMINOPHEN 5-325 MG PO TABS: 1.0000 | ORAL_TABLET | Freq: Three times a day (TID) | ORAL | 0 refills | Status: DC | PRN

## 2021-05-23 MED ORDER — HYDROMORPHONE HCL 1 MG/ML IJ SOLN
1.0000 mg | Freq: Once | INTRAMUSCULAR | Status: AC
  Administered 2021-05-23: 18:00:00 1 mg via INTRAVENOUS
  Filled 2021-05-23: qty 1

## 2021-05-23 NOTE — ED Triage Notes (Signed)
EMS reports pt slipped in yard and has deformity to R ankle.  EMS administered total of 10mg  morphine IM.  VSS per ems.

## 2021-05-23 NOTE — ED Provider Notes (Signed)
[facility] EMERGENCY DEPARTMENT Provider Note   CSN: [id number] Arrival date & time: 05/23/21  1732     History Chief Complaint  Patient presents with   Fall   Ankle Injury    right    Antonio [name] is a 50 y.o. male.   Fall Pertinent negatives include no chest pain, no abdominal pain and no shortness of breath.  Ankle Injury Pertinent negatives include no chest pain, no abdominal pain and no shortness of breath. Patient presents with right ankle injury.  Reportedly slipped down a hill and deformed his right ankle.  Given 10 mg of morphine IM by EMS.  Foot is rotated laterally.  No other injury.  Was not unconscious.  Not on blood thinners.  Has not eaten today.     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[place])    History of gout     Patient Active Problem List   Diagnosis Date Noted   Obesity, morbid ([place]) 07/23/2018   Iron deficiency anemia due to chronic blood loss 09/10/2017   Esophageal varices ([place]) 09/13/2016   Hepatic encephalopathy 03/25/2016   Seizures ([place]) 03/25/2016   Rectal bleeding    CHF (congestive heart failure) ([place]) 03/06/2016   Anemia 12/27/2015   Alcohol abuse 95/01/3266   Alcoholic cirrhosis of liver without ascites ([place])     Past Surgical History:  Procedure Laterality Date   BIOPSY  03/08/2016   Procedure: BIOPSY;  Surgeon: Daneil Dolin, MD;  Location: [facility];  Service: Endoscopy;;  descending colon biopsies   BIOPSY  04/14/2019   Procedure: BIOPSY;  Surgeon: Danie Binder, MD;  [facility]: [facility];  Service: Endoscopy;;   Colonoscopy     per patient around 2014 in [place] [name] 03/08/2016   Procedure: COLONOSCOPY WITH PROPOFOL;  Surgeon: Daneil Dolin, MD;  [facility];  Service: Endoscopy;  Laterality: [name];   ESOPHAGOGASTRODUODENOSCOPY (EGD) WITH PROPOFOL [name] 12/28/2015   Dr. Oneida Alar: 4 columns of esophageal varices,  3 grade 1, 1 grade 2 with no evidence of bleeding. Mild portal gastropathy with no evidence of active bleeding.   ESOPHAGOGASTRODUODENOSCOPY (EGD) WITH PROPOFOL [name] 03/08/2016   Procedure: ESOPHAGOGASTRODUODENOSCOPY (EGD) WITH PROPOFOL;  Surgeon: Daneil Dolin, MD;  [facility];  Service: Endoscopy;  Laterality: [name];   ESOPHAGOGASTRODUODENOSCOPY (EGD) WITH PROPOFOL [name] 04/14/2019   Procedure: ESOPHAGOGASTRODUODENOSCOPY (EGD) WITH PROPOFOL;  Surgeon: Danie Binder, MD;  [facility];  Service: Endoscopy;  Laterality: [name];  2:45pm   GIVENS CAPSULE STUDY [name] 07/05/2017   Procedure: GIVENS CAPSULE STUDY;  Surgeon: Danie Binder, MD;  [facility];  Service: Endoscopy;  Laterality: [name];  7:30am   GIVENS CAPSULE STUDY [name] 08/05/2017   Procedure: GIVENS CAPSULE STUDY;  Surgeon: Danie Binder, MD;  [facility];  Service: Endoscopy;  Laterality: [name];  7:30am   POLYPECTOMY  03/08/2016   Procedure: POLYPECTOMY;  Surgeon: Daneil Dolin, MD;  [facility];  Service: Endoscopy;;  cecum and descending        Family History  Problem Relation Age of Onset   Colon cancer Neg Hx    Liver disease Neg Hx     Social History   Tobacco Use   Smoking status: Former    Packs/day: 0.25    Types: Cigarettes    Quit date: 2016  Years since quitting: 6.9   Smokeless tobacco: Never  Vaping Use   Vaping Use: Never used  Substance Use Topics   Alcohol use: No    Comment: former--quit after 12/2015 hospitalization   Drug use: No    Home Medications Prior to Admission medications   Medication Sig Start Date End Date Taking? Authorizing Provider  ibuprofen (ADVIL) 400 MG tablet Take 400 mg by mouth every 6 (six) hours as needed.   Yes [provider]  lactulose (CHRONULAC) 10 GM/15ML solution TAKE TWO TABLESPOONFUL (30 ML) BY MOUTH THREE TIMES DAILY Patient taking differently: daily as needed for moderate constipation. 30 mls 3 times a  day 03/01/21  Yes Mahala Menghini, PA-C  nadolol (CORGARD) 20 MG tablet TAKE 1 TABLET BY MOUTH TWICE A DAY 11/04/20  Yes Erenest Rasher, PA-C  oxyCODONE-acetaminophen (PERCOCET/ROXICET) 5-325 MG tablet Take 1-2 tablets by mouth every 8 (eight) hours as needed for severe pain. 05/23/21  Yes Davonna Belling, MD  pantoprazole (PROTONIX) 40 MG tablet TAKE ONE TABLET BY MOUTH ONCE DAILY IN THE MORNING BEFORE BREAKFAST Patient taking differently: Take 40 mg by mouth daily. TAKE ONE TABLET BY MOUTH ONCE DAILY IN THE MORNING BEFORE BREAKFAST 04/28/20  Yes Carlis Stable, NP  torsemide (DEMADEX) 20 MG tablet TAKE 1 TABLET BY MOUTH DAILY TAKE 1/2 TABLE WHEN PATIENT HAS EXTRA SWELLING Patient taking differently: Take 20 mg by mouth daily as needed (fluid). 05/12/19  Yes Annitta Needs, NP  rifaximin (XIFAXAN) 550 MG TABS tablet Take 1 tablet (550 mg total) by mouth 2 (two) times daily. Patient not taking: Reported on 05/23/2021 07/14/20   [name], DO    Allergies    Patient has no known allergies.  Review of Systems   Review of Systems  Constitutional:  Negative for appetite change.  Respiratory:  Negative for shortness of breath.   Cardiovascular:  Negative for chest pain.  Gastrointestinal:  Negative for abdominal pain.  Musculoskeletal:  Negative for back pain.       Right ankle pain and deformity.  Skin:  Negative for wound.  Neurological:  Negative for weakness.   Physical Exam Updated Vital Signs BP 131/90    Pulse (!) 49    Temp 98.4 F (36.9 C) (Axillary)    Resp 14    Ht 6\' 2"  (1.88 m)    Wt (!) 154.2 kg    SpO2 100%    BMI 43.65 kg/m   Physical Exam Vitals and nursing note reviewed.  HENT:     Head: Atraumatic.  Eyes:     Pupils: Pupils are equal, round, and reactive to light.  Cardiovascular:     Rate and Rhythm: Regular rhythm.  Chest:     Chest wall: No tenderness.  Abdominal:     Tenderness: There is no abdominal tenderness.  Musculoskeletal:        General:  Tenderness present.     Comments: Right foot rotated about 90 degrees lateral.  Skin intact.  Sensation intact over foot.  Dorsalis pedis pulse is strong.  Skin:    General: Skin is warm.     Capillary Refill: Capillary refill takes less than 2 seconds.  Neurological:     Mental Status: He is alert and oriented to person, place, and time.    ED Results / Procedures / Treatments   Labs (all labs ordered are listed, but only abnormal results are displayed) Labs Reviewed - No data to display  EKG None  Radiology DG Ankle Complete Right  Result Date: 05/23/2021 CLINICAL DATA:  Reduction of right ankle fracture dislocation EXAM: RIGHT ANKLE - COMPLETE 3+ VIEW COMPARISON:  05/23/2021 FINDINGS: Frontal, oblique, and lateral views of the right ankle are obtained. There has been interval reduction of the trimalleolar fracture dislocation seen previously. There has been reduction of the prior tibiotalar dislocation, with mild lateral and dorsal subluxation of the talus remaining. Alignment is near anatomic. Mild residual distraction of the lateral, medial, and posterior malleolar fracture sites, with near anatomic alignment. There is diffuse soft tissue swelling. Cast material obscures underlying bony detail. IMPRESSION: 1. Interval reduction of the trimalleolar fracture dislocation seen previously, with resulting in near anatomic alignment. Electronically Signed   By: Randa Ngo M.D.   On: 05/23/2021 19:56   DG Ankle Complete Right  Result Date: 05/23/2021 CLINICAL DATA:  Fall, severe ankle pain with deformity. EXAM: RIGHT ANKLE - COMPLETE 3+ VIEW COMPARISON:  None. FINDINGS: Markedly displaced fractures of the medial malleolus and distal RIGHT fibula/lateral malleolus. Overlapping of the fibular fracture fragments. Lateral displacement of the medial malleolus relative to the distal tibia. Probable fracture of the posterior malleolus. Distal tibia is displaced medially relative to the talar  dome. Talar dome appears intact. Visualized osseous structures of the hindfoot and midfoot appear intact. IMPRESSION: Markedly displaced fractures of the distal tibia and distal RIGHT fibula/lateral malleolus, as detailed above and presumably trimalleolar. Electronically Signed   By: Franki Cabot M.D.   On: 05/23/2021 18:44    Procedures .Ortho Injury Treatment  Date/Time: 05/23/2021 6:50 PM Performed by: Davonna Belling, MD Authorized by: Davonna Belling, MD   Consent:    Consent obtained:  Verbal   Consent given by:  Patient   Risks discussed:  Fracture, irreducible dislocation, nerve damage, recurrent dislocation, restricted joint movement, vascular damage and stiffness   Alternatives discussed:  No treatment, alternative treatment, immobilization, referral and delayed treatmentInjury location: ankle Location details: right ankle Injury type: fracture-dislocation Fracture type: trimalleolar Pre-procedure neurovascular assessment: neurovascularly intact Pre-procedure distal perfusion: normal Pre-procedure neurological function: normal Pre-procedure range of motion: reduced  Anesthesia: Local anesthesia used: no  Patient sedated: Yes. Refer to sedation procedure documentation for details of sedation. Manipulation performed: yes Reduction successful: yes X-ray confirmed reduction: yes Immobilization: splint Splint type: Posterior and stirrup. Splint Applied by: ED Provider and ED Tech Supplies used: Ortho-Glass Post-procedure neurovascular assessment: post-procedure neurovascularly intact Post-procedure distal perfusion: normal Post-procedure neurological function: normal   .Sedation  Date/Time: 05/23/2021 6:50 PM Performed by: Davonna Belling, MD Authorized by: Davonna Belling, MD   Consent:    Consent obtained:  Verbal   Consent given by:  Patient   Risks discussed:  Allergic reaction, dysrhythmia, prolonged hypoxia resulting in organ damage, respiratory  compromise necessitating ventilatory assistance and intubation, vomiting, nausea and inadequate sedation   Alternatives discussed:  Analgesia without sedation Universal protocol:    Immediately prior to procedure, a time out was called: yes     Patient identity confirmed:  Arm band and verbally with patient Indications:    Procedure performed:  Fracture reduction   Procedure necessitating sedation performed by:  Physician performing sedation Pre-sedation assessment:    Time since last food or drink:  10   ASA classification: class 3 - patient with severe systemic disease     Mouth opening:  3 or more finger widths   Thyromental distance:  3 finger widths   Mallampati score:  III - soft palate, base of uvula visible   Neck  mobility: normal     Pre-sedation assessments completed and reviewed: airway patency, cardiovascular function, hydration status, mental status, pain level, respiratory function and temperature     Pre-sedation assessments completed and reviewed: pre-procedure nausea and vomiting status not reviewed   Immediate pre-procedure details:    Reassessment: Patient reassessed immediately prior to procedure     Reviewed: vital signs     Verified: bag valve mask available, emergency equipment available, intubation equipment available, IV patency confirmed, oxygen available and suction available   Procedure details (see MAR for exact dosages):    Preoxygenation:  Nasal cannula   Sedation:  Etomidate   Intended level of sedation: deep   Analgesia:  Hydromorphone   Intra-procedure monitoring:  Blood pressure monitoring, cardiac monitor, continuous capnometry, frequent LOC assessments and frequent vital sign checks   Intra-procedure events: none     Total Provider sedation time (minutes):  10 Post-procedure details:    Attendance: Constant attendance by certified staff until patient recovered     Recovery: Patient returned to pre-procedure baseline     Post-sedation assessments  completed and reviewed: airway patency, cardiovascular function, hydration status, mental status, pain level, respiratory function and temperature     Post-sedation assessments completed and reviewed: post-procedure nausea and vomiting status not reviewed     Patient is stable for discharge or admission: yes     Procedure completion:  Tolerated well, no immediate complications   Medications Ordered in ED Medications  HYDROmorphone (DILAUDID) injection 1 mg (1 mg Intravenous Given 05/23/21 1820)  ondansetron (ZOFRAN) injection 4 mg (4 mg Intravenous Given 05/23/21 1820)  etomidate (AMIDATE) injection 10 mg (10 mg Intravenous Given 05/23/21 1852)  oxyCODONE-acetaminophen (PERCOCET/ROXICET) 5-325 MG per tablet 1 tablet (1 tablet Oral Given 05/23/21 2123)    ED Course  I have reviewed the triage vital signs and the nursing notes.  Pertinent labs & imaging results that were available during my care of the patient were reviewed by me and considered in my medical decision making (see chart for details).    MDM Rules/Calculators/A&P                         Patient with mechanical fall.  Right ankle fracture dislocation.  Reduced under sedation.  Feels better.  Will have follow-up with Dr. Aline Brochure.  Immobilized.  Pain medicine given.  No other apparent injury.    Final Clinical Impression(s) / ED Diagnoses Final diagnoses:  Closed trimalleolar fracture of right ankle, initial encounter    Rx / DC Orders ED Discharge Orders          Ordered    oxyCODONE-acetaminophen (PERCOCET/ROXICET) 5-325 MG tablet  Every 8 hours PRN        05/23/21 2100             Davonna Belling, MD 05/24/21 0013

## 2021-05-25 ENCOUNTER — Encounter: Payer: Self-pay | Admitting: Orthopedic Surgery

## 2021-05-25 ENCOUNTER — Ambulatory Visit (INDEPENDENT_AMBULATORY_CARE_PROVIDER_SITE_OTHER): Payer: Managed Care, Other (non HMO) | Admitting: Orthopedic Surgery

## 2021-05-25 ENCOUNTER — Other Ambulatory Visit: Payer: Self-pay

## 2021-05-25 VITALS — Ht 74.0 in | Wt 340.0 lb

## 2021-05-25 DIAGNOSIS — S82851A Displaced trimalleolar fracture of right lower leg, initial encounter for closed fracture: Secondary | ICD-10-CM

## 2021-05-25 DIAGNOSIS — K709 Alcoholic liver disease, unspecified: Secondary | ICD-10-CM

## 2021-05-25 MED ORDER — OXYCODONE-ACETAMINOPHEN 5-325 MG PO TABS: 1.0000 | ORAL_TABLET | Freq: Four times a day (QID) | ORAL | 0 refills | Status: AC | PRN

## 2021-05-25 NOTE — Progress Notes (Signed)
Chief Complaint  Patient presents with   Fracture    Rt anklefx DOI 05/25/21    HPI: 50 year old male with alcoholic liver disease currently disabled secondary to that slipped down a hill fracture dislocated his right ankle underwent closed reduction in the emergency room and presented to our office on 29 December for evaluation and treatment complaining of discomfort in his right ankle  System review generalized fatigue no chest pain or shortness of breath  Family History  Problem Relation Age of Onset   Colon cancer Neg Hx    Liver disease Neg Hx    Social History   Tobacco Use   Smoking status: Former    Packs/day: 0.25    Types: Cigarettes    Quit date: 2016    Years since quitting: 6.9   Smokeless tobacco: Never  Vaping Use   Vaping Use: Never used  Substance Use Topics   Alcohol use: No    Comment: former--quit after 12/2015 hospitalization   Drug use: No   Past Surgical History:  Procedure Laterality Date   BIOPSY  03/08/2016   Procedure: BIOPSY;  Surgeon: Daneil Dolin, MD;  [facility];  Service: Endoscopy;;  descending colon biopsies   BIOPSY  04/14/2019   Procedure: BIOPSY;  Surgeon: Danie Binder, MD;  [facility];  Service: Endoscopy;;   Colonoscopy     per patient around 2014 in [place] [name] 03/08/2016   Procedure: COLONOSCOPY WITH PROPOFOL;  Surgeon: Daneil Dolin, MD;  [facility];  Service: Endoscopy;  Laterality: [name];   ESOPHAGOGASTRODUODENOSCOPY (EGD) WITH PROPOFOL [name] 12/28/2015   Dr. Oneida Alar: 4 columns of esophageal varices, 3 grade 1, 1 grade 2 with no evidence of bleeding. Mild portal gastropathy with no evidence of active bleeding.   ESOPHAGOGASTRODUODENOSCOPY (EGD) WITH PROPOFOL [name] 03/08/2016   Procedure: ESOPHAGOGASTRODUODENOSCOPY (EGD) WITH PROPOFOL;  Surgeon: Daneil Dolin, MD;  [facility];  Service: Endoscopy;  Laterality: [name];   ESOPHAGOGASTRODUODENOSCOPY (EGD) WITH  PROPOFOL [name] 04/14/2019   Procedure: ESOPHAGOGASTRODUODENOSCOPY (EGD) WITH PROPOFOL;  Surgeon: Danie Binder, MD;  [facility];  Service: Endoscopy;  Laterality: [name];  2:45pm   GIVENS CAPSULE STUDY [name] 07/05/2017   Procedure: GIVENS CAPSULE STUDY;  Surgeon: Danie Binder, MD;  [facility];  Service: Endoscopy;  Laterality: [name];  7:30am   GIVENS CAPSULE STUDY [name] 08/05/2017   Procedure: GIVENS CAPSULE STUDY;  Surgeon: Danie Binder, MD;  [facility];  Service: Endoscopy;  Laterality: [name];  7:30am   POLYPECTOMY  03/08/2016   Procedure: POLYPECTOMY;  Surgeon: Daneil Dolin, MD;  [facility];  Service: Endoscopy;;  cecum and descending    Meds ordered this encounter  Medications   oxyCODONE-acetaminophen (PERCOCET) 5-325 MG tablet    Sig: Take 1 tablet by mouth every 6 (six) hours as needed for up to 5 days for severe pain.    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[place])    History of gout     Ht 6\' 2"  (1.88 m)    Wt (!) 340 lb (154.2 kg)    BMI 43.65 kg/m    General appearance: Well-developed well-nourished no gross deformities  Cardiovascular normal pulse and perfusion normal color without edema  Neurologically no sensation loss or deficits or pathologic reflexes  Psychological: Awake alert and oriented x3 mood and affect normal  Skin no lacerations or ulcerations no nodularity no palpable masses, no erythema or nodularity  Musculoskeletal: Splint on right foot not removed neurovascular exam toes moving color capillary refill normal  Imaging outside imaging.  I have reviewed the images personally.  The images show trimalleolar fracture with no mortise disruption followed by close reduction films which show improved alignment  A/P  Trimalleolar ankle fracture right  Encounter Diagnoses  Name Primary?   Alcoholic  liver disease (HCC)    Closed trimalleolar fracture of right ankle, initial encounter Yes    The procedure has been fully reviewed with the patient; The risks and benefits of surgery have been discussed and explained and understood. Alternative treatment has also been reviewed, questions were encouraged and answered. The postoperative plan is also been reviewed.  Open reduction internal fixation right ankle  Risks factors specific to this patient includes his alcoholic liver disease making him at increased risk for bleeding  His weight is also an issue  1 year recovery discussed with the patient  He will be out of commission for a year  Patient will return on Wednesday for skin check surgery as already been scheduled for Friday, January 6

## 2021-05-26 ENCOUNTER — Telehealth: Payer: Self-pay | Admitting: Radiology

## 2021-05-26 NOTE — Telephone Encounter (Signed)
No authorization required for [id number] ORIF ankle fracture Conf # automated call with [place]

## 2021-05-26 NOTE — Patient Instructions (Addendum)
Antonio [name]  05/26/2021     @PREFPERIOPPHARMACY @   Your procedure is scheduled on  06/02/2021.   Report to [facility] at  [place].   Call this number if you have problems the morning of surgery:  [phone]   Remember:  Do not eat or drink after midnight.      Take these medicines the morning of surgery with A SIP OF WATER                 nadolol, oxycodone (if needed), protonix.    Do not wear jewelry, make-up or nail polish.  Do not wear lotions, powders, or perfumes, or deodorant.  Do not shave 48 hours prior to surgery.  Men may shave face and neck.  Do not bring valuables to the hospital.  [facility] is not responsible for any belongings or valuables.  Contacts, dentures or bridgework may not be worn into surgery.  Leave your suitcase in the car.  After surgery it may be brought to your room.  For patients admitted to the hospital, discharge time will be determined by your treatment team.  Patients discharged the day of surgery will not be allowed to drive home and must have someone with them for 24 hours.    Special instructions:   DO NOT smoke tobacco or vape for 24 hours before your procedure.  Please read over the following fact sheets that you were given. Coughing and Deep Breathing, Surgical Site Infection Prevention, Anesthesia Post-op Instructions, and Care and Recovery After Surgery       Displaced Trimalleolar Ankle Fracture Treated With ORIF, Care After This sheet gives you information about how to care for yourself after your procedure. Your health care provider may also give you more specific instructions. If you have problems or questions, contact your health care provider. What can I expect after the procedure? After the procedure, it is common to have: Pain. Swelling. A small amount of fluid from your incision. Follow these instructions at home: If you have a splint or boot: Wear the splint or boot as told by your health care  provider. Remove it only as told by your health care provider. Loosen the splint or boot if your toes tingle, become numb, or turn cold and blue. Keep the splint or boot clean and dry. If you have a cast: Do not stick anything inside the cast to scratch your skin. Doing that increases your risk of infection. Check the skin around the cast every day. Tell your health care provider about any concerns. You may put lotion on dry skin around the edges of the cast. Do not put lotion on the skin underneath the cast. Keep the cast clean and dry. Bathing Do not take baths, swim, or use a hot tub until your health care provider approves. Ask your health care provider if you can take showers. If your splint, boot, or cast is not waterproof: Do not let it get wet. Cover it with a watertight covering when you take a bath or a shower. Keep your bandage (dressing) dry until your health care provider says it can be removed. Incision care  Follow instructions from your health care provider about how to take care of your incision. Make sure you: Wash your hands with soap and water for at least 20 seconds before and after you change your dressing. If soap and water are not available, use hand sanitizer. Change your dressing as told  by your health care provider. Leave stitches (sutures), skin glue, or adhesive strips in place. These skin closures may need to stay in place for 2 weeks or longer. If adhesive strip edges start to loosen and curl up, you may trim the loose edges. Do not remove adhesive strips completely unless your health care provider tells you to do that. Check your incision area every day for signs of infection. Check for: Redness. More pain or swelling. Blood or more fluid. Warmth. Pus or a bad smell. Managing pain, stiffness, and swelling  If directed, put ice on the affected area. To do this: If you have a removable splint or boot, remove it as told by your health care provider. Put ice  in a plastic bag. Place a towel between your skin and the bag or between your cast and the bag. Leave the ice on for 20 minutes, 2-3 times a day. Remove the ice if your skin turns bright red. This is very important. If you cannot feel pain, heat, or cold, you have a greater risk of damage to the area. Move your toes often to reduce stiffness and swelling. Raise (elevate) the injured area above the level of your heart while you are sitting or lying down. To do this, try putting a few pillows under your leg and ankle. Activity Do exercises as told by your health care provider or physical therapist. Do not use your injured limb to support (bear) your body weight until your health care provider says that you can. Follow weight-bearing restrictions as told. Use crutches or other devices to help you move around (assistive devices) as told by your health care provider. Ask your health care provider when it is safe to drive if you have a splint, boot, or cast on your foot. Return to your normal activities as told by your health care provider. Ask your health care provider what activities are safe for you. General instructions Do not put pressure on any part of the splint or cast until it is fully hardened. This may take several hours. Do not use any products that contain nicotine or tobacco, such as cigarettes and e-cigarettes. These can delay bone healing. If you need help quitting, ask your health care provider. Take over-the-counter and prescription medicines only as told by your health care provider. Ask your health care provider if the medicine prescribed to you: Requires you to avoid driving or using machinery. Can cause constipation. You may need to take these actions to prevent or treat constipation: Drink enough fluid to keep your urine pale yellow. Take over-the-counter or prescription medicines. Eat foods that are high in fiber, such as beans, whole grains, and fresh fruits and  vegetables. Limit foods that are high in fat and processed sugars, such as fried or sweet foods. Keep all follow-up visits. This is important. Contact a health care provider if: You have a fever. Your pain medicine is not helping. You have redness around your incision. You have more pain or swelling around your incision. You have blood or more fluid coming from your incision or leaking through your cast. Your incision feels warm to the touch. You have pus or a bad smell coming from your incision or from your cast or dressing. Get help right away if: The edges of your incision come apart after the sutures or staples have been taken out. You have chest pain or trouble breathing. You have numbness or tingling in your foot or leg. Your foot becomes cold, pale, or  blue. You have pain or swelling in your calf. These symptoms may represent a serious problem that is an emergency. Do not wait to see if the symptoms will go away. Get medical help right away. Call your local emergency services (911 in the [place] Do not drive yourself to the hospital. Summary After the procedure, it is common to have some pain and swelling. If your splint, boot, or cast is not waterproof, do not let it get wet. Contact your health care provider if you have more pain or swelling, or if you have blood or more fluid coming from your incision or leaking through your cast. Get help right away if you have numbness or tingling in your foot or leg, or if your foot becomes cold, pale, or blue. This information is not intended to replace advice given to you by your health care provider. Make sure you discuss any questions you have with your health care provider. Document Revised: 09/14/2019 Document Reviewed: 09/14/2019 Elsevier Patient Education  [place] Anesthesia, Adult, Care After This sheet gives you information about how to care for yourself after your procedure. Your health care provider may also give  you more specific instructions. If you have problems or questions, contact your health care provider. What can I expect after the procedure? After the procedure, the following side effects are common: Pain or discomfort at the IV site. Nausea. Vomiting. Sore throat. Trouble concentrating. Feeling cold or chills. Feeling weak or tired. Sleepiness and fatigue. Soreness and body aches. These side effects can affect parts of the body that were not involved in surgery. Follow these instructions at home: For the time period you were told by your health care provider:  Rest. Do not participate in activities where you could fall or become injured. Do not drive or use machinery. Do not drink alcohol. Do not take sleeping pills or medicines that cause drowsiness. Do not make important decisions or sign legal documents. Do not take care of children on your own. Eating and drinking Follow any instructions from your health care provider about eating or drinking restrictions. When you feel hungry, start by eating small amounts of foods that are soft and easy to digest (bland), such as toast. Gradually return to your regular diet. Drink enough fluid to keep your urine pale yellow. If you vomit, rehydrate by drinking water, juice, or clear broth. General instructions If you have sleep apnea, surgery and certain medicines can increase your risk for breathing problems. Follow instructions from your health care provider about wearing your sleep device: Anytime you are sleeping, including during daytime naps. While taking prescription pain medicines, sleeping medicines, or medicines that make you drowsy. Have a responsible adult stay with you for the time you are told. It is important to have someone help care for you until you are awake and alert. Return to your normal activities as told by your health care provider. Ask your health care provider what activities are safe for you. Take over-the-counter  and prescription medicines only as told by your health care provider. If you smoke, do not smoke without supervision. Keep all follow-up visits as told by your health care provider. This is important. Contact a health care provider if: You have nausea or vomiting that does not get better with medicine. You cannot eat or drink without vomiting. You have pain that does not get better with medicine. You are unable to pass urine. You develop a skin rash. You have a fever. You have  redness around your IV site that gets worse. Get help right away if: You have difficulty breathing. You have chest pain. You have blood in your urine or stool, or you vomit blood. Summary After the procedure, it is common to have a sore throat or nausea. It is also common to feel tired. Have a responsible adult stay with you for the time you are told. It is important to have someone help care for you until you are awake and alert. When you feel hungry, start by eating small amounts of foods that are soft and easy to digest (bland), such as toast. Gradually return to your regular diet. Drink enough fluid to keep your urine pale yellow. Return to your normal activities as told by your health care provider. Ask your health care provider what activities are safe for you. This information is not intended to replace advice given to you by your health care provider. Make sure you discuss any questions you have with your health care provider. Document Revised: 01/28/2020 Document Reviewed: 08/27/2019 Elsevier Patient Education  2022 [place]. How to Use Chlorhexidine for Bathing Chlorhexidine gluconate (CHG) is a germ-killing (antiseptic) solution that is used to clean the skin. It can get rid of the bacteria that normally live on the skin and can keep them away for about 24 hours. To clean your skin with CHG, you may be given: A CHG solution to use in the shower or as part of a sponge bath. A prepackaged cloth that  contains CHG. Cleaning your skin with CHG may help lower the risk for infection: While you are staying in the intensive care unit of the hospital. If you have a vascular access, such as a central line, to provide short-term or long-term access to your veins. If you have a catheter to drain urine from your bladder. If you are on a ventilator. A ventilator is a machine that helps you breathe by moving air in and out of your lungs. After surgery. What are the risks? Risks of using CHG include: A skin reaction. Hearing loss, if CHG gets in your ears and you have a perforated eardrum. Eye injury, if CHG gets in your eyes and is not rinsed out. The CHG product catching fire. Make sure that you avoid smoking and flames after applying CHG to your skin. Do not use CHG: If you have a chlorhexidine allergy or have previously reacted to chlorhexidine. On babies younger than 41 months of age. How to use CHG solution Use CHG only as told by your health care provider, and follow the instructions on the label. Use the full amount of CHG as directed. Usually, this is one bottle. During a shower Follow these steps when using CHG solution during a shower (unless your health care provider gives you different instructions): Start the shower. Use your normal soap and shampoo to wash your face and hair. Turn off the shower or move out of the shower stream. Pour the CHG onto a clean washcloth. Do not use any type of brush or rough-edged sponge. Starting at your neck, lather your body down to your toes. Make sure you follow these instructions: If you will be having surgery, pay special attention to the part of your body where you will be having surgery. Scrub this area for at least 1 minute. Do not use CHG on your head or face. If the solution gets into your ears or eyes, rinse them well with water. Avoid your genital area. Avoid any areas of  skin that have broken skin, cuts, or scrapes. Scrub your back and  under your arms. Make sure to wash skin folds. Let the lather sit on your skin for 1-2 minutes or as long as told by your health care provider. Thoroughly rinse your entire body in the shower. Make sure that all body creases and crevices are rinsed well. Dry off with a clean towel. Do not put any substances on your body afterward--such as powder, lotion, or perfume--unless you are told to do so by your health care provider. Only use lotions that are recommended by the manufacturer. Put on clean clothes or pajamas. If it is the night before your surgery, sleep in clean sheets.  During a sponge bath Follow these steps when using CHG solution during a sponge bath (unless your health care provider gives you different instructions): Use your normal soap and shampoo to wash your face and hair. Pour the CHG onto a clean washcloth. Starting at your neck, lather your body down to your toes. Make sure you follow these instructions: If you will be having surgery, pay special attention to the part of your body where you will be having surgery. Scrub this area for at least 1 minute. Do not use CHG on your head or face. If the solution gets into your ears or eyes, rinse them well with water. Avoid your genital area. Avoid any areas of skin that have broken skin, cuts, or scrapes. Scrub your back and under your arms. Make sure to wash skin folds. Let the lather sit on your skin for 1-2 minutes or as long as told by your health care provider. Using a different clean, wet washcloth, thoroughly rinse your entire body. Make sure that all body creases and crevices are rinsed well. Dry off with a clean towel. Do not put any substances on your body afterward--such as powder, lotion, or perfume--unless you are told to do so by your health care provider. Only use lotions that are recommended by the manufacturer. Put on clean clothes or pajamas. If it is the night before your surgery, sleep in clean sheets. How to use  CHG prepackaged cloths Only use CHG cloths as told by your health care provider, and follow the instructions on the label. Use the CHG cloth on clean, dry skin. Do not use the CHG cloth on your head or face unless your health care provider tells you to. When washing with the CHG cloth: Avoid your genital area. Avoid any areas of skin that have broken skin, cuts, or scrapes. Before surgery Follow these steps when using a CHG cloth to clean before surgery (unless your health care provider gives you different instructions): Using the CHG cloth, vigorously scrub the part of your body where you will be having surgery. Scrub using a back-and-forth motion for 3 minutes. The area on your body should be completely wet with CHG when you are done scrubbing. Do not rinse. Discard the cloth and let the area air-dry. Do not put any substances on the area afterward, such as powder, lotion, or perfume. Put on clean clothes or pajamas. If it is the night before your surgery, sleep in clean sheets.  For general bathing Follow these steps when using CHG cloths for general bathing (unless your health care provider gives you different instructions). Use a separate CHG cloth for each area of your body. Make sure you wash between any folds of skin and between your fingers and toes. Wash your body in the following order, switching  to a new cloth after each step: The front of your neck, shoulders, and chest. Both of your arms, under your arms, and your hands. Your stomach and groin area, avoiding the genitals. Your right leg and foot. Your left leg and foot. The back of your neck, your back, and your buttocks. Do not rinse. Discard the cloth and let the area air-dry. Do not put any substances on your body afterward--such as powder, lotion, or perfume--unless you are told to do so by your health care provider. Only use lotions that are recommended by the manufacturer. Put on clean clothes or pajamas. Contact a health  care provider if: Your skin gets irritated after scrubbing. You have questions about using your solution or cloth. You swallow any chlorhexidine. Call your local poison control center (1-601-289-5392 in the [place] Get help right away if: Your eyes itch badly, or they become very red or swollen. Your skin itches badly and is red or swollen. Your hearing changes. You have trouble seeing. You have swelling or tingling in your mouth or throat. You have trouble breathing. These symptoms may represent a serious problem that is an emergency. Do not wait to see if the symptoms will go away. Get medical help right away. Call your local emergency services (911 in the [place] Do not drive yourself to the hospital. Summary Chlorhexidine gluconate (CHG) is a germ-killing (antiseptic) solution that is used to clean the skin. Cleaning your skin with CHG may help to lower your risk for infection. You may be given CHG to use for bathing. It may be in a bottle or in a prepackaged cloth to use on your skin. Carefully follow your health care provider's instructions and the instructions on the product label. Do not use CHG if you have a chlorhexidine allergy. Contact your health care provider if your skin gets irritated after scrubbing. This information is not intended to replace advice given to you by your health care provider. Make sure you discuss any questions you have with your health care provider. Document Revised: 07/25/2020 Document Reviewed: 07/25/2020 Elsevier Patient Education  2022 [employer].

## 2021-05-28 ENCOUNTER — Encounter (HOSPITAL_COMMUNITY): Payer: Self-pay | Admitting: Adult Health

## 2021-05-30 DIAGNOSIS — S82851A Displaced trimalleolar fracture of right lower leg, initial encounter for closed fracture: Secondary | ICD-10-CM | POA: Insufficient documentation

## 2021-05-30 DIAGNOSIS — S82841A Displaced bimalleolar fracture of right lower leg, initial encounter for closed fracture: Secondary | ICD-10-CM | POA: Insufficient documentation

## 2021-05-31 ENCOUNTER — Other Ambulatory Visit: Payer: Self-pay

## 2021-05-31 ENCOUNTER — Ambulatory Visit (INDEPENDENT_AMBULATORY_CARE_PROVIDER_SITE_OTHER): Payer: 59 | Admitting: Orthopedic Surgery

## 2021-05-31 ENCOUNTER — Encounter (HOSPITAL_COMMUNITY): Payer: Self-pay | Admitting: Adult Health

## 2021-05-31 ENCOUNTER — Encounter (HOSPITAL_COMMUNITY)
Admission: RE | Admit: 2021-05-31 | Discharge: 2021-05-31 | Disposition: A | Discharge status: Home / Self Care | Payer: 59 | Source: Ambulatory Visit | Attending: Orthopedic Surgery | Admitting: Orthopedic Surgery

## 2021-05-31 DIAGNOSIS — Z01818 Encounter for other preprocedural examination: Secondary | ICD-10-CM | POA: Diagnosis not present

## 2021-05-31 DIAGNOSIS — S82851D Displaced trimalleolar fracture of right lower leg, subsequent encounter for closed fracture with routine healing: Secondary | ICD-10-CM

## 2021-05-31 DIAGNOSIS — K709 Alcoholic liver disease, unspecified: Secondary | ICD-10-CM | POA: Insufficient documentation

## 2021-05-31 LAB — CBC WITH DIFFERENTIAL/PLATELET
Abs Immature Granulocytes: 0.01 10*3/uL (ref 0.00–0.07)
Basophils Absolute: 0.1 10*3/uL (ref 0.0–0.1)
Basophils Relative: 1 %
Eosinophils Absolute: 0.2 10*3/uL (ref 0.0–0.5)
Eosinophils Relative: 5 %
HCT: 35.3 % — ABNORMAL LOW (ref 39.0–52.0)
Hemoglobin: 10.7 g/dL — ABNORMAL LOW (ref 13.0–17.0)
Immature Granulocytes: 0 %
Lymphocytes Relative: 27 %
Lymphs Abs: 1.2 10*3/uL (ref 0.7–4.0)
MCH: 25.7 pg — ABNORMAL LOW (ref 26.0–34.0)
MCHC: 30.3 g/dL (ref 30.0–36.0)
MCV: 84.7 fL (ref 80.0–100.0)
Monocytes Absolute: 0.6 10*3/uL (ref 0.1–1.0)
Monocytes Relative: 15 %
Neutro Abs: 2.3 10*3/uL (ref 1.7–7.7)
Neutrophils Relative %: 52 %
Platelets: 234 10*3/uL (ref 150–400)
RBC: 4.17 MIL/uL — ABNORMAL LOW (ref 4.22–5.81)
RDW: 17.7 % — ABNORMAL HIGH (ref 11.5–15.5)
WBC: 4.4 10*3/uL (ref 4.0–10.5)
nRBC: 0 % (ref 0.0–0.2)

## 2021-05-31 LAB — COMPREHENSIVE METABOLIC PANEL
ALT: 21 U/L (ref 0–44)
AST: 53 U/L — ABNORMAL HIGH (ref 15–41)
Albumin: 2.7 g/dL — ABNORMAL LOW (ref 3.5–5.0)
Alkaline Phosphatase: 195 U/L — ABNORMAL HIGH (ref 38–126)
Anion gap: 7 (ref 5–15)
BUN: 11 mg/dL (ref 6–20)
CO2: 25 mmol/L (ref 22–32)
Calcium: 8.6 mg/dL — ABNORMAL LOW (ref 8.9–10.3)
Chloride: 103 mmol/L (ref 98–111)
Creatinine, Ser: 0.84 mg/dL (ref 0.61–1.24)
GFR, Estimated: >60 mL/min (ref >60)
Glucose, Bld: 89 mg/dL (ref 70–99)
Potassium: 4.4 mmol/L (ref 3.5–5.1)
Sodium: 135 mmol/L (ref 135–145)
Total Bilirubin: 2.1 mg/dL — ABNORMAL HIGH (ref 0.3–1.2)
Total Protein: 6.8 g/dL (ref 6.5–8.1)

## 2021-05-31 LAB — PROTIME-INR
INR: 1.2 (ref 0.8–1.2)
Prothrombin Time: 15.5 seconds — ABNORMAL HIGH (ref 11.4–15.2)

## 2021-05-31 NOTE — Progress Notes (Signed)
Follow-up visit to check skin in preparation for surgery on Friday  Encounter Diagnosis  Name Primary?   Closed trimalleolar fracture of right ankle with routine healing, subsequent encounter 05/23/21 Yes   51 year old male status post closed reduction trimalleolar fracture 8 days ago presents for reevaluation of his skin envelope  His splint was removed and replaced his skin looks clean for surgery  We will see the patient on Friday for ORIF of the right ankle

## 2021-06-01 NOTE — H&P (Signed)
Chief Complaint  Patient presents with   Fracture      Rt anklefx DOI 05/25/21      HPI: 51 year old male with alcoholic liver disease currently disabled secondary to that slipped down a hill fracture dislocated his right ankle underwent closed reduction in the emergency room and presented to our office on 29 December for evaluation and treatment complaining of discomfort in his right ankle  System review generalized fatigue no chest pain or shortness of breath        Family History  Problem Relation Age of Onset   Colon cancer Neg Hx     Liver disease Neg Hx      Social History         Tobacco Use   Smoking status: Former      Packs/day: 0.25      Types: Cigarettes      Quit date: 2016      Years since quitting: 6.9   Smokeless tobacco: Never  Vaping Use   Vaping Use: Never used  Substance Use Topics   Alcohol use: No      Comment: former--quit after 12/2015 hospitalization   Drug use: No         Past Surgical History:  Procedure Laterality Date   BIOPSY   03/08/2016    Procedure: BIOPSY;  Surgeon: Daneil Dolin, MD;  [facility];  Service: Endoscopy;;  descending colon biopsies   BIOPSY   04/14/2019    Procedure: BIOPSY;  Surgeon: Danie Binder, MD;  Location: [facility];  Service: Endoscopy;;   Colonoscopy        per patient around 2014 in [place] [name] 03/08/2016    Procedure: COLONOSCOPY WITH PROPOFOL;  Surgeon: Daneil Dolin, MD;  Location: [facility];  Service: Endoscopy;  Laterality: [name];   ESOPHAGOGASTRODUODENOSCOPY (EGD) WITH PROPOFOL [name] 12/28/2015    Dr. Oneida Alar: 4 columns of esophageal varices, 3 grade 1, 1 grade 2 with no evidence of bleeding. Mild portal gastropathy with no evidence of active bleeding.   ESOPHAGOGASTRODUODENOSCOPY (EGD) WITH PROPOFOL [name] 03/08/2016    Procedure: ESOPHAGOGASTRODUODENOSCOPY (EGD) WITH PROPOFOL;  Surgeon: Daneil Dolin, MD;  [facility];  Service: Endoscopy;   Laterality: [name];   ESOPHAGOGASTRODUODENOSCOPY (EGD) WITH PROPOFOL [name] 04/14/2019    Procedure: ESOPHAGOGASTRODUODENOSCOPY (EGD) WITH PROPOFOL;  Surgeon: Danie Binder, MD;  [facility]: [facility];  Service: Endoscopy;  Laterality: [name];  2:45pm   GIVENS CAPSULE STUDY [name] 07/05/2017    Procedure: GIVENS CAPSULE STUDY;  Surgeon: Danie Binder, MD;  [facility]: [facility];  Service: Endoscopy;  Laterality: [name];  7:30am   GIVENS CAPSULE STUDY [name] 08/05/2017    Procedure: GIVENS CAPSULE STUDY;  Surgeon: Danie Binder, MD;  [facility];  Service: Endoscopy;  Laterality: [name];  7:30am   POLYPECTOMY   03/08/2016    Procedure: POLYPECTOMY;  Surgeon: Daneil Dolin, MD;  [facility];  Service: Endoscopy;;  cecum and descending         Meds ordered this encounter  Medications   oxyCODONE-acetaminophen (PERCOCET) 5-325 MG tablet      Sig: Take 1 tablet by mouth every 6 (six) hours as needed for up to 5 days for severe pain.      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[place])     History of gout        Ht 6\' 2"  (1.88 m)    Wt (!) 340 lb (154.2 kg)    BMI 43.65 kg/m      General appearance: Well-developed well-nourished no gross deformities  Cardiovascular normal pulse and perfusion normal color without edema  Neurologically no sensation loss or deficits or pathologic reflexes   Psychological: Awake alert and oriented x3 mood and affect normal   Skin no lacerations or ulcerations no nodularity no palpable masses, no erythema or nodularity   Musculoskeletal: Splint on right foot not removed neurovascular exam toes moving color capillary refill normal   Imaging outside imaging.  I have reviewed the images personally.  The images show trimalleolar fracture with no mortise disruption followed by close reduction films which show improved alignment    A/P   Trimalleolar ankle fracture right       Encounter Diagnoses  Name Primary?   Alcoholic liver disease (HCC)     Closed trimalleolar fracture of right ankle, initial encounter Yes      The procedure has been fully reviewed with the patient; The risks and benefits of surgery have been discussed and explained and understood. Alternative treatment has also been reviewed, questions were encouraged and answered. The postoperative plan is also been reviewed.   Open reduction internal fixation right ankle  Risks factors specific to this patient includes his alcoholic liver disease making him at increased risk for bleeding   His weight is also an issue   1 year recovery discussed with the patient   He will be out of commission for a year

## 2021-06-02 ENCOUNTER — Ambulatory Visit (HOSPITAL_COMMUNITY): Payer: 59 | Admitting: Certified Registered Nurse Anesthetist

## 2021-06-02 ENCOUNTER — Encounter (HOSPITAL_COMMUNITY): Payer: Self-pay | Admitting: Orthopedic Surgery

## 2021-06-02 ENCOUNTER — Ambulatory Visit (HOSPITAL_COMMUNITY)
Admission: RE | Admit: 2021-06-02 | Discharge: 2021-06-02 | Disposition: A | Discharge status: Home / Self Care | Payer: 59 | Attending: Orthopedic Surgery | Admitting: Orthopedic Surgery

## 2021-06-02 ENCOUNTER — Encounter (HOSPITAL_COMMUNITY)
Admission: RE | Disposition: A | Discharge status: Home / Self Care | Payer: Self-pay | Source: Home / Self Care | Attending: Orthopedic Surgery

## 2021-06-02 ENCOUNTER — Ambulatory Visit (HOSPITAL_COMMUNITY): Payer: 59

## 2021-06-02 DIAGNOSIS — R5383 Other fatigue: Secondary | ICD-10-CM | POA: Insufficient documentation

## 2021-06-02 DIAGNOSIS — S82891A Other fracture of right lower leg, initial encounter for closed fracture: Secondary | ICD-10-CM

## 2021-06-02 DIAGNOSIS — S82851A Displaced trimalleolar fracture of right lower leg, initial encounter for closed fracture: Secondary | ICD-10-CM

## 2021-06-02 DIAGNOSIS — Z6841 Body Mass Index (BMI) 40.0 and over, adult: Secondary | ICD-10-CM | POA: Diagnosis not present

## 2021-06-02 DIAGNOSIS — S82841A Displaced bimalleolar fracture of right lower leg, initial encounter for closed fracture: Secondary | ICD-10-CM | POA: Diagnosis present

## 2021-06-02 DIAGNOSIS — Z87891 Personal history of nicotine dependence: Secondary | ICD-10-CM | POA: Insufficient documentation

## 2021-06-02 DIAGNOSIS — S82841D Displaced bimalleolar fracture of right lower leg, subsequent encounter for closed fracture with routine healing: Secondary | ICD-10-CM | POA: Diagnosis not present

## 2021-06-02 DIAGNOSIS — D649 Anemia, unspecified: Secondary | ICD-10-CM | POA: Diagnosis not present

## 2021-06-02 DIAGNOSIS — W010XXA Fall on same level from slipping, tripping and stumbling without subsequent striking against object, initial encounter: Secondary | ICD-10-CM | POA: Diagnosis not present

## 2021-06-02 DIAGNOSIS — K709 Alcoholic liver disease, unspecified: Secondary | ICD-10-CM | POA: Diagnosis not present

## 2021-06-02 DIAGNOSIS — D759 Disease of blood and blood-forming organs, unspecified: Secondary | ICD-10-CM | POA: Diagnosis not present

## 2021-06-02 HISTORY — PX: ORIF ANKLE FRACTURE: SHX5408

## 2021-06-02 SURGERY — OPEN REDUCTION INTERNAL FIXATION (ORIF) ANKLE FRACTURE
Anesthesia: General | Site: Ankle | Laterality: Right

## 2021-06-02 MED ORDER — CEFAZOLIN IN SODIUM CHLORIDE 3-0.9 GM/100ML-% IV SOLN
3.0000 g | INTRAVENOUS | Status: AC
  Administered 2021-06-02: 3 g via INTRAVENOUS
  Filled 2021-06-02: qty 100

## 2021-06-02 MED ORDER — HEMOSTATIC AGENTS (NO CHARGE) OPTIME
TOPICAL | Status: DC | PRN
  Administered 2021-06-02: 1

## 2021-06-02 MED ORDER — CHLORHEXIDINE GLUCONATE 4 % EX LIQD: 60.0000 mL | Freq: Once | CUTANEOUS | Status: DC

## 2021-06-02 MED ORDER — MIDAZOLAM HCL 2 MG/2ML IJ SOLN
INTRAMUSCULAR | Status: AC
  Filled 2021-06-02: qty 2

## 2021-06-02 MED ORDER — FENTANYL CITRATE (PF) 100 MCG/2ML IJ SOLN
INTRAMUSCULAR | Status: AC
  Filled 2021-06-02: qty 2

## 2021-06-02 MED ORDER — DEXAMETHASONE SODIUM PHOSPHATE 4 MG/ML IJ SOLN
INTRAMUSCULAR | Status: DC | PRN
  Administered 2021-06-02: 10 mg via INTRAVENOUS

## 2021-06-02 MED ORDER — CHLORHEXIDINE GLUCONATE 0.12 % MT SOLN
15.0000 mL | Freq: Once | OROMUCOSAL | Status: AC
  Administered 2021-06-02: 15 mL via OROMUCOSAL

## 2021-06-02 MED ORDER — BUPIVACAINE-EPINEPHRINE (PF) 0.5% -1:200000 IJ SOLN
INTRAMUSCULAR | Status: AC
  Filled 2021-06-02: qty 30

## 2021-06-02 MED ORDER — ONDANSETRON HCL 4 MG/2ML IJ SOLN: 4.0000 mg | Freq: Once | INTRAMUSCULAR | Status: DC | PRN

## 2021-06-02 MED ORDER — DEXAMETHASONE SODIUM PHOSPHATE 10 MG/ML IJ SOLN
INTRAMUSCULAR | Status: AC
  Filled 2021-06-02: qty 1

## 2021-06-02 MED ORDER — ONDANSETRON HCL 4 MG/2ML IJ SOLN
INTRAMUSCULAR | Status: DC | PRN
  Administered 2021-06-02: 4 mg via INTRAVENOUS

## 2021-06-02 MED ORDER — MIDAZOLAM HCL 5 MG/5ML IJ SOLN
INTRAMUSCULAR | Status: DC | PRN
  Administered 2021-06-02: 2 mg via INTRAVENOUS

## 2021-06-02 MED ORDER — PROMETHAZINE HCL 12.5 MG PO TABS: 12.5000 mg | ORAL_TABLET | Freq: Four times a day (QID) | ORAL | 0 refills | Status: DC | PRN

## 2021-06-02 MED ORDER — PHENYLEPHRINE 40 MCG/ML (10ML) SYRINGE FOR IV PUSH (FOR BLOOD PRESSURE SUPPORT)
PREFILLED_SYRINGE | INTRAVENOUS | Status: AC
  Filled 2021-06-02: qty 10

## 2021-06-02 MED ORDER — OXYCODONE-ACETAMINOPHEN 5-325 MG PO TABS: 1.0000 | ORAL_TABLET | ORAL | 0 refills | Status: DC | PRN

## 2021-06-02 MED ORDER — ONDANSETRON HCL 4 MG/2ML IJ SOLN
INTRAMUSCULAR | Status: AC
  Filled 2021-06-02: qty 4

## 2021-06-02 MED ORDER — ROCURONIUM BROMIDE 10 MG/ML (PF) SYRINGE
PREFILLED_SYRINGE | INTRAVENOUS | Status: AC
  Filled 2021-06-02: qty 10

## 2021-06-02 MED ORDER — ONDANSETRON HCL 4 MG/2ML IJ SOLN
4.0000 mg | Freq: Once | INTRAMUSCULAR | Status: AC
  Administered 2021-06-02: 4 mg via INTRAVENOUS
  Filled 2021-06-02: qty 2

## 2021-06-02 MED ORDER — PROPOFOL 10 MG/ML IV BOLUS
INTRAVENOUS | Status: DC | PRN
  Administered 2021-06-02: 250 mg via INTRAVENOUS

## 2021-06-02 MED ORDER — POVIDONE-IODINE 10 % EX SWAB: 2.0000 "application " | Freq: Once | CUTANEOUS | Status: DC

## 2021-06-02 MED ORDER — PHENYLEPHRINE HCL (PRESSORS) 10 MG/ML IV SOLN
INTRAVENOUS | Status: DC | PRN
  Administered 2021-06-02: 120 ug via INTRAVENOUS
  Administered 2021-06-02: 200 ug via INTRAVENOUS
  Administered 2021-06-02: 120 ug via INTRAVENOUS

## 2021-06-02 MED ORDER — LIDOCAINE HCL (PF) 2 % IJ SOLN
INTRAMUSCULAR | Status: AC
  Filled 2021-06-02: qty 10

## 2021-06-02 MED ORDER — BUPIVACAINE-EPINEPHRINE (PF) 0.5% -1:200000 IJ SOLN
INTRAMUSCULAR | Status: DC | PRN
  Administered 2021-06-02: 30 mL

## 2021-06-02 MED ORDER — 0.9 % SODIUM CHLORIDE (POUR BTL) OPTIME
TOPICAL | Status: DC | PRN
  Administered 2021-06-02 (×2): 1000 mL

## 2021-06-02 MED ORDER — HYDROMORPHONE HCL 1 MG/ML IJ SOLN
0.2500 mg | INTRAMUSCULAR | Status: DC | PRN
  Administered 2021-06-02 (×2): 0.5 mg via INTRAVENOUS
  Filled 2021-06-02 (×2): qty 0.5

## 2021-06-02 MED ORDER — ORAL CARE MOUTH RINSE: 15.0000 mL | Freq: Once | OROMUCOSAL | Status: AC

## 2021-06-02 MED ORDER — IBUPROFEN 800 MG PO TABS
800.0000 mg | ORAL_TABLET | Freq: Once | ORAL | Status: AC
  Administered 2021-06-02: 800 mg via ORAL
  Filled 2021-06-02: qty 1

## 2021-06-02 MED ORDER — FENTANYL CITRATE (PF) 100 MCG/2ML IJ SOLN
INTRAMUSCULAR | Status: DC | PRN
  Administered 2021-06-02: 100 ug via INTRAVENOUS
  Administered 2021-06-02 (×6): 50 ug via INTRAVENOUS

## 2021-06-02 MED ORDER — EPHEDRINE SULFATE 50 MG/ML IJ SOLN
INTRAMUSCULAR | Status: DC | PRN
  Administered 2021-06-02: 5 mg via INTRAVENOUS
  Administered 2021-06-02: 20 mg via INTRAVENOUS

## 2021-06-02 MED ORDER — LACTATED RINGERS IV SOLN: INTRAVENOUS | Status: DC

## 2021-06-02 MED ORDER — FENTANYL CITRATE PF 50 MCG/ML IJ SOSY
25.0000 ug | PREFILLED_SYRINGE | INTRAMUSCULAR | Status: DC | PRN
  Administered 2021-06-02: 50 ug via INTRAVENOUS
  Filled 2021-06-02: qty 1

## 2021-06-02 MED ORDER — OXYCODONE HCL 5 MG PO TABS
10.0000 mg | ORAL_TABLET | Freq: Once | ORAL | Status: AC
  Administered 2021-06-02: 10 mg via ORAL
  Filled 2021-06-02: qty 2

## 2021-06-02 SURGICAL SUPPLY — 62 items
BANDAGE ESMARK 4X12 BL STRL LF (DISPOSABLE) ×1 IMPLANT
BIT DRILL 2 CANN GRADUATED (BIT) ×1 IMPLANT
BIT DRILL 2.5 CANN LNG (BIT) ×1 IMPLANT
BIT DRILL 2.6 CANN (BIT) ×1 IMPLANT
BLADE SURG SZ10 CARB STEEL (BLADE) ×2 IMPLANT
BNDG COHESIVE 4X5 TAN ST LF (GAUZE/BANDAGES/DRESSINGS) ×1 IMPLANT
BNDG COHESIVE 4X5 TAN STRL (GAUZE/BANDAGES/DRESSINGS) ×2 IMPLANT
BNDG ELASTIC 4X5.8 VLCR NS LF (GAUZE/BANDAGES/DRESSINGS) ×4 IMPLANT
BNDG ESMARK 4X12 BLUE STRL LF (DISPOSABLE) ×2
CHLORAPREP W/TINT 26 (MISCELLANEOUS) ×4 IMPLANT
CLOTH BEACON ORANGE TIMEOUT ST (SAFETY) ×2 IMPLANT
COVER LIGHT HANDLE STERIS (MISCELLANEOUS) ×4 IMPLANT
CUFF TOURN SGL QUICK 42 (TOURNIQUET CUFF) ×1 IMPLANT
DRAPE C-ARM FOLDED MOBILE STRL (DRAPES) ×2 IMPLANT
DRAPE HALF SHEET 40X57 (DRAPES) ×1 IMPLANT
ELECT REM PT RETURN 9FT ADLT (ELECTROSURGICAL) ×2
ELECTRODE REM PT RTRN 9FT ADLT (ELECTROSURGICAL) ×1 IMPLANT
GAUZE SPONGE 4X4 12PLY STRL (GAUZE/BANDAGES/DRESSINGS) ×3 IMPLANT
GAUZE XEROFORM 5X9 LF (GAUZE/BANDAGES/DRESSINGS) ×2 IMPLANT
GLOVE SS N UNI LF 8.5 STRL (GLOVE) ×2 IMPLANT
GLOVE SURG POLYISO LF SZ7 (GLOVE) ×2 IMPLANT
GLOVE SURG POLYISO LF SZ8 (GLOVE) ×3 IMPLANT
GLOVE SURG UNDER POLY LF SZ7 (GLOVE) ×6 IMPLANT
GOWN STRL REUS W/TWL LRG LVL3 (GOWN DISPOSABLE) ×4 IMPLANT
GOWN STRL REUS W/TWL XL LVL3 (GOWN DISPOSABLE) ×2 IMPLANT
GUIDEWIRE 1.35MM (WIRE) ×3 IMPLANT
INST SET MINOR BONE (KITS) ×2 IMPLANT
K-WIRE BB-TAK (WIRE) ×4
KIT TURNOVER KIT A (KITS) ×2 IMPLANT
KWIRE BB-TAK (WIRE) IMPLANT
MANIFOLD NEPTUNE II (INSTRUMENTS) ×2 IMPLANT
NDL HYPO 21X1.5 SAFETY (NEEDLE) ×1 IMPLANT
NEEDLE HYPO 21X1.5 SAFETY (NEEDLE) ×2 IMPLANT
NS IRRIG 1000ML POUR BTL (IV SOLUTION) ×2 IMPLANT
PACK BASIC LIMB (CUSTOM PROCEDURE TRAY) ×2 IMPLANT
PAD ABD 5X9 TENDERSORB (GAUZE/BANDAGES/DRESSINGS) ×4 IMPLANT
PAD ARMBOARD 7.5X6 YLW CONV (MISCELLANEOUS) ×2 IMPLANT
PAD CAST 4YDX4 CTTN HI CHSV (CAST SUPPLIES) ×1 IMPLANT
PADDING CAST COTTON 4X4 STRL (CAST SUPPLIES) ×1
PLATE TITANIUM 6H RT FIB ANKLE (Plate) ×1 IMPLANT
SCREW CORT 3.5X18 THRD (Screw) ×1 IMPLANT
SCREW CORT TI FT ANKLE 3.5X20 (Screw) ×1 IMPLANT
SCREW LO-PRO TI 3.5X16MM (Screw) ×2 IMPLANT
SCREW LOCK 18X3XVALOPRFL (Screw) IMPLANT
SCREW LOCK TI QF 3X20 (Screw) ×1 IMPLANT
SCREW LOCKING 3.0X18 (Screw) ×2 IMPLANT
SCREW LOCKING VARIABLE 3.0X16 (Screw) ×1 IMPLANT
SCREW LP TI 3.5X14MM (Screw) ×2 IMPLANT
SCREW QCFIX CANN 4X46 SHT (Screw) ×2 IMPLANT
SET BASIN LINEN APH (SET/KITS/TRAYS/PACK) ×2 IMPLANT
SPLINT FIBERGLASS 4X30 (CAST SUPPLIES) ×1 IMPLANT
SPONGE GAUZE 4X4 12PLY (GAUZE/BANDAGES/DRESSINGS) ×2 IMPLANT
SPONGE T-LAP 18X18 ~~LOC~~+RFID (SPONGE) ×4 IMPLANT
STAPLER VISISTAT 35W (STAPLE) ×2 IMPLANT
SUT ETHILON 3 0 FSL (SUTURE) ×1 IMPLANT
SUT MON AB 0 CT1 (SUTURE) ×3 IMPLANT
SUT MON AB 2-0 CT1 36 (SUTURE) ×2 IMPLANT
SUT SILK 3 0 (SUTURE) ×2
SUT SILK 3-0 18XBRD TIE 12 (SUTURE) IMPLANT
SUT SILK 3-0 FS1 18XBRD (SUTURE) IMPLANT
SYR 30ML LL (SYRINGE) ×2 IMPLANT
SYR BULB IRRIG 60ML STRL (SYRINGE) ×2 IMPLANT

## 2021-06-02 NOTE — Op Note (Addendum)
06/02/2021  12:15 PM  PATIENT:  Antonio [name]  51 y.o. male  PRE-OPERATIVE DIAGNOSIS:  TRIMALL FRACTURE  POST-OPERATIVE DIAGNOSIS:  BIMALL FRACTURE  PROCEDURE:  Procedure(s): OPEN REDUCTION INTERNAL FIXATION (ORIF) ANKLE FRACTURE (Right)  ARTHREX   6 HOLE LOCKING PLATE  [id number] Implants distal locking cluster 4 locking screws and 6 cortical screws proximally  SURGEON:  Surgeon(s) and Role:    Carole Civil, MD - Primary  PHYSICIAN ASSISTANT:   ASSISTANTS: CYNTHIA WRENN   ANESTHESIA:   general  EBL:  100 mL   BLOOD ADMINISTERED:none  DRAINS: none   LOCAL MEDICATIONS USED:  MARCAINE     SPECIMEN:  No Specimen  DISPOSITION OF SPECIMEN:  [name]  COUNTS:  YES  TOURNIQUET:   Total Tourniquet Time Documented: Thigh (Right) - 108 minutes Total: Thigh (Right) - 108 minutes   DICTATION: .Viviann Spare Dictation  PLAN OF CARE: Discharge to home after PACU  PATIENT DISPOSITION:  PACU - hemodynamically stable.   Delay start of Pharmacological VTE agent (>24hrs) due to surgical blood loss or risk of bleeding: not applicable   Details:   The patient was brought to the operating room w/ general anesthesia placed supine.  The patient was placed supine protected on his  10 pounds sandbag under the blankets taped to the bed distally   After time out the right leg was prepped and draped sterilely. The right leg was exanguinated with a 4 in esamrch and the touniquet was elevated to 275  I made the first incision over the lateral malleolus and extended it up the shaft of the fibula.  The skin was then full-thickness flaps down to.  Patient had a aggressive periosteal reaction and healing reaction.  The fracture site was opened up irrigated debrided and then with traction and internal rotation manual reduction was performed and held with two-point Cardoxin clamps.  Radiographs confirmed reduction of the ankle mortise.  A 6-hole plate was taken from the Arthrex set.   This plate had distal locking screws capability.  It fit easily without to the lateral malleolus.  BB tacks were placed to hold the plate we started with the proximal fragment and placed a cortical screw and then the distal fragment came into the proximal fragment with plate.  Continue to alternating back-and-forth placing screws using AO technique.  We removed the BB tacks and continued a distal locking screws and eventually proximal cortical screws.  Imaging showed reduction of the mortise restoration of length and rotation  We then  Attention to the medial malleolus.  We also made a full-thickness incision.  We did not disrupt the saphenous vein and used cautery and local places to control the bleeding.  Opened up the fracture debrided irrigated.  There was a 4 mm cartilaginous piece that was removed from the joint.  Seem to be coming from the distal tibia.  Using a pointed reduction clamp we made a provisional reduction of the medial malleolus and took an x-ray.  We put pins and removing the clamp x-ray was not satisfied so I removed the bandage leaving 1 in place to manipulate the fragment into position keying off the anterior medial corner of the joint.  This gave a much better reduction.  We placed pins and again and then overdrilled the pins and placed 4.0 45 mm screws.  Reduction was confirmed and x-ray confirmed reduction of medial malleolus as well.  We used a bone hook to perform a cotton test and there was no mortise disruption.  Thorough irrigation of both areas was performed and closure started immediately with 0 Monocryl suture.  We then turned our attention laterally.  At this point we lost tourniquet control.  We basically had a venous tourniquet with extensive backflow bleeding.  However, initially, this may have been due to his liver disease and try to cauterize and tie off the vessels as I encountered them.  I placed a piece of Surgicel in the morning.  After losing quite a bit of blood I  decided to release the tourniquet and placed a pressure dressing over the wounds.  The medial side was not really bleeding at all was profusely bleeding.  The release of the tourniquet and the pressure dressing seem to control the bleeding and I continued with 0 Monocryl laterally to close the wound followed by staples on both sides.  I did feild blocks with Marcaine with epinephrine 20 cc for the superficial peroneal nerve and 10 cc for the saphenous nerve  I used Surgicel over each incision followed by Xeroform, 4 x 4's ABDs cast padding 4 inch fiberglass splint followed by Ace wraps  The patient was extubated taken recovery stable condition  Postoperative plan  Check the cast/splint, complaints to explain minutes.  Nonweightbearing for probably 12 weeks.  At 6 weeks we can probably go with cam walker and start exercises

## 2021-06-02 NOTE — Interval H&P Note (Signed)
History and Physical Interval Note:  06/02/2021 8:30 AM  Antonio [name]  has presented today for surgery, with the diagnosis of TRIMALL FRACTURE.  The various methods of treatment have been discussed with the patient and family. After consideration of risks, benefits and other options for treatment, the patient has consented to  Procedure(s): OPEN REDUCTION INTERNAL FIXATION (ORIF) ANKLE FRACTURE (Right) as a surgical intervention.  The patient's history has been reviewed, patient examined, no change in status, stable for surgery.  I have reviewed the patient's chart and labs.  Questions were answered to the patient's satisfaction.     Arther Abbott

## 2021-06-02 NOTE — Anesthesia Postprocedure Evaluation (Signed)
Anesthesia Post Note  Patient: Antonio [name]  Procedure(s) Performed: OPEN REDUCTION INTERNAL FIXATION (ORIF) ANKLE FRACTURE (Right: Ankle)  Patient location during evaluation: Phase II Anesthesia Type: General Level of consciousness: awake Pain management: pain level controlled Vital Signs Assessment: post-procedure vital signs reviewed and stable Respiratory status: spontaneous breathing and respiratory function stable Cardiovascular status: blood pressure returned to baseline and stable Postop Assessment: no headache and no apparent nausea or vomiting Anesthetic complications: no Comments: Late entry   No notable events documented.   Last Vitals:  Vitals:   06/02/21 1110 06/02/21 1130  BP: (!) 103/58 104/66  Pulse: (!) 58 (!) 58  Resp: (!) 9 11  Temp: 36.6 C   SpO2: 97% 95%    Last Pain:  Vitals:   06/02/21 1110  TempSrc:   PainSc: Horace

## 2021-06-02 NOTE — Brief Op Note (Addendum)
06/02/2021  12:15 PM  PATIENT:  Antonio [name]  51 y.o. male  PRE-OPERATIVE DIAGNOSIS:  TRIMALL FRACTURE  POST-OPERATIVE DIAGNOSIS:  BIIMALL FRACTURE  PROCEDURE:  Procedure(s): OPEN REDUCTION INTERNAL FIXATION (ORIF) ANKLE FRACTURE (Right)  ARTHREX   6 HOLE LOCKING PLATE  [id number] Implants distal locking cluster 4 locking screws and 4 cortical screws proximally  SURGEON:  Surgeon(s) and Role:    Carole Civil, MD - Primary  PHYSICIAN ASSISTANT:   ASSISTANTS: CYNTHIA WRENN   ANESTHESIA:   general  EBL:  100 mL   BLOOD ADMINISTERED:none  DRAINS: none   LOCAL MEDICATIONS USED:  MARCAINE     SPECIMEN:  No Specimen  DISPOSITION OF SPECIMEN:  [name]  COUNTS:  YES  TOURNIQUET:   Total Tourniquet Time Documented: Thigh (Right) - 108 minutes Total: Thigh (Right) - 108 minutes   DICTATION: .Viviann Spare Dictation  PLAN OF CARE: Discharge to home after PACU  PATIENT DISPOSITION:  PACU - hemodynamically stable.   Delay start of Pharmacological VTE agent (>24hrs) due to surgical blood loss or risk of bleeding: not applicable

## 2021-06-02 NOTE — Anesthesia Preprocedure Evaluation (Addendum)
Anesthesia Evaluation  Patient identified by MRN, date of birth, ID band Patient awake    Reviewed: Allergy & Precautions, H&P , NPO status , Patient's Chart, lab work & pertinent test results, reviewed documented beta blocker date and time   Airway Mallampati: II  TM Distance: >3 FB Neck ROM: full    Dental no notable dental hx.    Pulmonary neg pulmonary ROS, former smoker,    Pulmonary exam normal breath sounds clear to auscultation       Cardiovascular Exercise Tolerance: Good negative cardio ROS   Rhythm:regular Rate:Normal     Neuro/Psych negative psych ROS   GI/Hepatic negative GI ROS, (+) Hepatitis -  Endo/Other  Morbid obesity  Renal/GU negative Renal ROS  negative genitourinary   Musculoskeletal   Abdominal   Peds  Hematology  (+) Blood dyscrasia, anemia ,   Anesthesia Other Findings - Left ventricle: The cavity size was normal. Wall thickness was  increased in a pattern of moderate LVH. Systolic function was  normal. The estimated ejection fraction was in the range of 60%  to 65%. Diastolic function is abnormal, indeterminate grade. Wall  motion was normal; there were no regional wall motion  abnormalities.  - Aortic valve: Valve area (VTI): 3.9 cm^2. Valve area (Vmax): 3.74  cm^2. Valve area (Vmean): 4.11 cm^2.  - Left atrium: The atrium was mildly dilated.  - Technically adequate study.   Reproductive/Obstetrics negative OB ROS                            Anesthesia Physical Anesthesia Plan  ASA: 3  Anesthesia Plan: General and General LMA   Post-op Pain Management:    Induction:   PONV Risk Score and Plan:   Airway Management Planned:   Additional Equipment:   Intra-op Plan:   Post-operative Plan:   Informed Consent: I have reviewed the patients History and Physical, chart, labs and discussed the procedure including the risks, benefits and  alternatives for the proposed anesthesia with the patient or authorized representative who has indicated his/her understanding and acceptance.     Dental Advisory Given  Plan Discussed with: CRNA  Anesthesia Plan Comments:        Anesthesia Quick Evaluation

## 2021-06-02 NOTE — Transfer of Care (Signed)
Immediate Anesthesia Transfer of Care Note  Patient: Antonio [name]  Procedure(s) Performed: OPEN REDUCTION INTERNAL FIXATION (ORIF) ANKLE FRACTURE (Right: Ankle)  Patient Location: PACU  Anesthesia Type:General  Level of Consciousness: awake, alert  and oriented  Airway & Oxygen Therapy: Patient Spontanous Breathing  Post-op Assessment: Report given to RN and Post -op Vital signs reviewed and stable  Post vital signs: Reviewed and stable  Last Vitals:  Vitals Value Taken Time  BP 103/58 06/02/21 1118  Temp    Pulse 57 06/02/21 1124  Resp 8 06/02/21 1124  SpO2 98 % 06/02/21 1124  Vitals shown include unvalidated device data.  Last Pain:  Vitals:   06/02/21 0749  TempSrc: Oral  PainSc: 3       Patients Stated Pain Goal: 5 (03/13/50 0258)  Complications: No notable events documented.

## 2021-06-02 NOTE — Brief Op Note (Addendum)
06/02/2021  11:23 AM  PATIENT:  Antonio [name]  51 y.o. male  PRE-OPERATIVE DIAGNOSIS:  TRIMALL FRACTURE  POST-OPERATIVE DIAGNOSIS:  BIMALL FRACTURE  PROCEDURE:  Procedure(s): OPEN REDUCTION INTERNAL FIXATION (ORIF) ANKLE FRACTURE (Right)  ARTHREX   6 HOLE LOCKING PLATE  [id number] Implants distal locking cluster 4 locking screws and 4 cortical screws proximally  SURGEON:  Surgeon(s) and Role:    Carole Civil, MD - Primary  PHYSICIAN ASSISTANT:   ASSISTANTS: CYNTHIA WRENN   ANESTHESIA:   general  EBL:  100 mL   BLOOD ADMINISTERED:none  DRAINS: none   LOCAL MEDICATIONS USED:  MARCAINE     SPECIMEN:  No Specimen  DISPOSITION OF SPECIMEN:  [name]  COUNTS:  YES  TOURNIQUET:   Total Tourniquet Time Documented: Thigh (Right) - 108 minutes Total: Thigh (Right) - 108 minutes   DICTATION: .Viviann Spare Dictation  PLAN OF CARE: Discharge to home after PACU  PATIENT DISPOSITION:  PACU - hemodynamically stable.   Delay start of Pharmacological VTE agent (>24hrs) due to surgical blood loss or risk of bleeding: not applicable

## 2021-06-02 NOTE — Anesthesia Procedure Notes (Signed)
Procedure Name: LMA Insertion Date/Time: 06/02/2021 9:00 AM Performed by: Minerva Ends, CRNA Pre-anesthesia Checklist: Patient identified, Emergency Drugs available, Suction available and Patient being monitored Patient Re-evaluated:Patient Re-evaluated prior to induction Oxygen Delivery Method: Circle system utilized Preoxygenation: Pre-oxygenation with 100% oxygen Induction Type: IV induction LMA: LMA inserted LMA Size: 5.0 Tube type: Oral Number of attempts: 1 Placement Confirmation: positive ETCO2 and breath sounds checked- equal and bilateral Tube secured with: Tape Dental Injury: Teeth and Oropharynx as per pre-operative assessment

## 2021-06-05 ENCOUNTER — Encounter (HOSPITAL_COMMUNITY): Payer: Self-pay | Admitting: Orthopedic Surgery

## 2021-06-05 ENCOUNTER — Telehealth: Payer: Self-pay | Admitting: Orthopedic Surgery

## 2021-06-05 NOTE — Telephone Encounter (Signed)
Completed a prior authorization for Oxycodone it has been approved.

## 2021-06-07 ENCOUNTER — Other Ambulatory Visit: Payer: Self-pay

## 2021-06-07 ENCOUNTER — Ambulatory Visit (INDEPENDENT_AMBULATORY_CARE_PROVIDER_SITE_OTHER): Payer: 59 | Admitting: Orthopedic Surgery

## 2021-06-07 ENCOUNTER — Encounter: Payer: Self-pay | Admitting: Orthopedic Surgery

## 2021-06-07 DIAGNOSIS — S82851D Displaced trimalleolar fracture of right lower leg, subsequent encounter for closed fracture with routine healing: Secondary | ICD-10-CM

## 2021-06-07 MED ORDER — IBUPROFEN 800 MG PO TABS: 800.0000 mg | ORAL_TABLET | Freq: Three times a day (TID) | ORAL | 1 refills | Status: DC | PRN

## 2021-06-07 MED ORDER — OXYCODONE-ACETAMINOPHEN 5-325 MG PO TABS: 1.0000 | ORAL_TABLET | ORAL | 0 refills | Status: AC | PRN

## 2021-06-07 NOTE — Progress Notes (Signed)
Chief Complaint  Patient presents with   Postop    Postop check ORIF right ankle June 02, 2020    Status post open treatment internal fixation right ankle  Cast check cast is intact no excessive bleeding  Neurovascular exam is intact  Pain is controlled with Percocet  Patient not taking the ibuprofen  Recommend the following medications along with ice and elevation  Meds ordered this encounter  Medications   ibuprofen (ADVIL) 800 MG tablet    Sig: Take 1 tablet (800 mg total) by mouth every 8 (eight) hours as needed.    Dispense:  90 tablet    Refill:  1   oxyCODONE-acetaminophen (PERCOCET) 5-325 MG tablet    Sig: Take 1 tablet by mouth every 4 (four) hours as needed for up to 7 days for severe pain.    Dispense:  42 tablet    Refill:  0    F/u next week  check wound  Xrays

## 2021-06-07 NOTE — Patient Instructions (Signed)
Note for wife to be oow   Ice   Ibuprofen   Elevate

## 2021-06-14 ENCOUNTER — Ambulatory Visit: Payer: 59

## 2021-06-14 ENCOUNTER — Other Ambulatory Visit: Payer: Self-pay

## 2021-06-14 ENCOUNTER — Ambulatory Visit (INDEPENDENT_AMBULATORY_CARE_PROVIDER_SITE_OTHER): Payer: 59 | Admitting: Orthopedic Surgery

## 2021-06-14 DIAGNOSIS — S82851D Displaced trimalleolar fracture of right lower leg, subsequent encounter for closed fracture with routine healing: Secondary | ICD-10-CM

## 2021-06-14 MED ORDER — HYDROCODONE-ACETAMINOPHEN 10-325 MG PO TABS: 1.0000 | ORAL_TABLET | ORAL | 0 refills | Status: AC | PRN

## 2021-06-14 MED ORDER — IBUPROFEN 800 MG PO TABS: 800.0000 mg | ORAL_TABLET | Freq: Three times a day (TID) | ORAL | 1 refills | Status: DC | PRN

## 2021-06-14 NOTE — Progress Notes (Signed)
Postop visit  Chief Complaint  Patient presents with   Routine Post Op    DOS 06/02/21  Xray + wound check    Encounter Diagnosis  Name Primary?   Closed trimalleolar fracture of right ankle with routine healing, subsequent encounter 05/23/21 ORIF 06/02/21 Yes    2 weeks out from surgery, exactly 12 days  We remove the staples  The distal portion of the lateral wound is wet the medial wound has some scant bleeding  The foot is still swollen  The x-ray shows maintenance of position of the fracture  Recommend 1 week check the wounds  Repeat the splint

## 2021-06-19 ENCOUNTER — Ambulatory Visit: Payer: 59 | Admitting: Orthopedic Surgery

## 2021-06-21 ENCOUNTER — Other Ambulatory Visit: Payer: Self-pay

## 2021-06-21 ENCOUNTER — Encounter: Payer: Self-pay | Admitting: Orthopedic Surgery

## 2021-06-21 ENCOUNTER — Ambulatory Visit (INDEPENDENT_AMBULATORY_CARE_PROVIDER_SITE_OTHER): Payer: 59 | Admitting: Orthopedic Surgery

## 2021-06-21 VITALS — Ht 74.0 in | Wt 340.0 lb

## 2021-06-21 DIAGNOSIS — S82851D Displaced trimalleolar fracture of right lower leg, subsequent encounter for closed fracture with routine healing: Secondary | ICD-10-CM

## 2021-06-21 MED ORDER — HYDROCODONE-ACETAMINOPHEN 5-325 MG PO TABS: 1.0000 | ORAL_TABLET | Freq: Four times a day (QID) | ORAL | 0 refills | Status: DC | PRN

## 2021-06-21 NOTE — Progress Notes (Addendum)
Chief Complaint  Patient presents with   Routine Post Op    Rt ankle fx DOS 06/02/21   Postoperative visit #3 status post ORIF of the trimalleolar fracture  His wounds look good he was placed into a cam walker he still not weightbearing  Follow-up in 3 to 4 weeks for x-ray  Meds ordered this encounter  Medications   HYDROcodone-acetaminophen (NORCO/VICODIN) 5-325 MG tablet    Sig: Take 1 tablet by mouth every 6 (six) hours as needed for moderate pain.    Dispense:  30 tablet    Refill:  0

## 2021-06-21 NOTE — Addendum Note (Signed)
Addended by: Arther Abbott E on: 06/21/2021 11:08 AM   Modules accepted: Orders

## 2021-07-12 ENCOUNTER — Ambulatory Visit (INDEPENDENT_AMBULATORY_CARE_PROVIDER_SITE_OTHER): Payer: 59 | Admitting: Orthopedic Surgery

## 2021-07-12 ENCOUNTER — Ambulatory Visit: Payer: 59

## 2021-07-12 ENCOUNTER — Other Ambulatory Visit: Payer: Self-pay

## 2021-07-12 DIAGNOSIS — S82851D Displaced trimalleolar fracture of right lower leg, subsequent encounter for closed fracture with routine healing: Secondary | ICD-10-CM | POA: Diagnosis not present

## 2021-07-12 NOTE — Progress Notes (Signed)
Postop visit  Antonio [name] had surgery on June 02, 2021 had open reduction internal fixation of a trimalleolar fracture with medial lateral fixation  He is doing well in his cam walker he was supposed to be nonweightbearing but he says he has been walking with 1 crutch  His wounds and skin look good other than some dryness  His x-ray today shows the maintenance of the fixation  I told him he can weight-bear in the boot with 2 crutches and x-rays will be done in 6 weeks

## 2021-07-13 ENCOUNTER — Telehealth: Payer: Self-pay

## 2021-07-13 NOTE — Telephone Encounter (Signed)
Antonio [name], I also FYI' ed to Dr. Abbey Chatters:  FYI:   Dr. Abbey Chatters, This pt has been non-compliant according to office note/letter sent to this pt. Cover [employer] Med[employer] sent over a PA for his Xifaxan. I went ahead and did it but I also scheduled pt for virtual with Vicente Males on 3/7 @ 1 pm. I also sent him a letter urging him to keep this appt. I spoke to his wife she advises pt is not good with electronics for the virtual so I advised her then in office visit then she said his ankle was broken and he would need a wheelchair which we have. I advised her either way this visit needs to be kept. I see where this ankle injury was in Dec.

## 2021-07-13 NOTE — Telephone Encounter (Signed)
FYI:  Dr. Abbey Chatters, This pt has been non-compliant according to office note/letter sent to this pt. Cover My Meds sent over a PA for his Xifaxan. I went ahead and did it but I also scheduled pt for virtual with Vicente Males on 3/7 @ 1 pm. I also sent him a letter urging him to keep this appt. I spoke to his wife she advises pt is not good with electronics for the virtual so I advised her then in office visit then she said his ankle was broken and he would need a wheelchair which we have. I advised her either way this visit needs to be kept. I see where this ankle injury was in Dec.

## 2021-07-15 NOTE — Telephone Encounter (Signed)
Noted  

## 2021-07-17 NOTE — Telephone Encounter (Signed)
[employer] denied the pt's coverage of Xifaxan (rifaximin) because the pt does not meet requirements of his plan. This drug will only be covered if the pt is taking Lactulose along with it. He has to be on lactulose while on Xifaxan. Please advise

## 2021-08-01 ENCOUNTER — Other Ambulatory Visit: Payer: Self-pay

## 2021-08-01 ENCOUNTER — Ambulatory Visit (INDEPENDENT_AMBULATORY_CARE_PROVIDER_SITE_OTHER): Payer: 59 | Admitting: Gastroenterology

## 2021-08-01 ENCOUNTER — Telehealth: Payer: Self-pay | Admitting: *Deleted

## 2021-08-01 ENCOUNTER — Encounter: Payer: Self-pay | Admitting: Gastroenterology

## 2021-08-01 VITALS — BP 110/80 | HR 110 | Temp 97.5°F | Ht 74.0 in | Wt 336.4 lb

## 2021-08-01 DIAGNOSIS — K7030 Alcoholic cirrhosis of liver without ascites: Secondary | ICD-10-CM | POA: Diagnosis not present

## 2021-08-01 DIAGNOSIS — I8500 Esophageal varices without bleeding: Secondary | ICD-10-CM

## 2021-08-01 DIAGNOSIS — D500 Iron deficiency anemia secondary to blood loss (chronic): Secondary | ICD-10-CM

## 2021-08-01 DIAGNOSIS — Z86010 Personal history of colonic polyps: Secondary | ICD-10-CM

## 2021-08-01 DIAGNOSIS — Z8619 Personal history of other infectious and parasitic diseases: Secondary | ICD-10-CM

## 2021-08-01 MED ORDER — PEG 3350-KCL-NA BICARB-NACL 420 G PO SOLR: 4000.0000 mL | ORAL | 0 refills | Status: DC

## 2021-08-01 NOTE — H&P (View-Only) (Signed)
? ? ? ? ? ?Gastroenterology Office Note   ? ? ?Primary Care Physician:  Monico Blitz, MD  ?Primary Gastroenterologist: Dr. Abbey Chatters ? ? ?Chief Complaint  ? ?Chief Complaint  ?Patient presents with  ? Follow-up  ? Cirrhosis  ? ? ? ?History of Present Illness  ? ?Antonio [name] is a 51 y.o. male presenting today in follow-up with a history of cirrhosis due to ETOH, IDA, esophageal varices, H.pylori gastritis in 2020 with need to document eradication, tubular adenomas and overdue for surveillance,chronic GERD, remote history of encephalopathy.  ? ?He is taking Protonix once daily. No encephalopathy. No jaundice or pruritis. No abdominal pain. No constipation. No rectal bleeding. No ETOH for 6 years. Comptroller. Takes lactulose prn as per Dr. Oneida Alar' prior recommendations when gets constipated.  ? ?HR 80 at rest. BP110/80. On Nadolol. He feels light-headed and dizzy at times.  ? ? ? ? ?Past Medical History:  ?Diagnosis Date  ? Alcohol abuse   ? Alcoholic hepatitis   ? Cirrhosis with alcoholism ([place])   ? History of gout   ? ? ?Past Surgical History:  ?Procedure Laterality Date  ? BIOPSY  03/08/2016  ? Procedure: BIOPSY;  Surgeon: Daneil Dolin, MD;  [facility];  Service: Endoscopy;;  descending colon biopsies  ? BIOPSY  04/14/2019  ? Procedure: BIOPSY;  Surgeon: Danie Binder, MD;  [facility];  Service: Endoscopy;;  ? Colonoscopy    ? per patient around 2014 in [place]   ? COLONOSCOPY WITH PROPOFOL [name] 03/08/2016  ? One 8 mm polyp in the descending colon, removed with a hot snare. Resected and Retrieved.One 4 mm polyp in the cecum, removed with a cold snare. Resected and retrieved. Rectal varices. Melanosis coli. Abnormal left colon mucosa status post biopsy. Tubular adenomas. Surveillance was due 2022.  ? ESOPHAGOGASTRODUODENOSCOPY (EGD) WITH PROPOFOL [name] 12/28/2015  ? Dr. Oneida Alar: 4 columns of esophageal varices, 3 grade 1, 1 grade 2 with no evidence of bleeding. Mild portal  gastropathy with no evidence of active bleeding.  ? ESOPHAGOGASTRODUODENOSCOPY (EGD) WITH PROPOFOL [name] 03/08/2016  ? Procedure: ESOPHAGOGASTRODUODENOSCOPY (EGD) WITH PROPOFOL;  Surgeon: Daneil Dolin, MD;  [facility];  Service: Endoscopy;  Laterality: [name];  ? ESOPHAGOGASTRODUODENOSCOPY (EGD) WITH PROPOFOL [name] 04/14/2019  ? Grade 1 and 2 esophageal varices without bleeding and no stigmata of bleeding. moderate portal gastropath, mild erosive gastritis. Path with positive H.pylori  ? GIVENS CAPSULE STUDY [name] 07/05/2017  ? Procedure: GIVENS CAPSULE STUDY;  Surgeon: Danie Binder, MD;  [facility];  Service: Endoscopy;  Laterality: [name];  7:30am  ? GIVENS CAPSULE STUDY [name] 08/05/2017  ? Procedure: GIVENS CAPSULE STUDY;  Surgeon: Danie Binder, MD;  [facility];  Service: Endoscopy;  Laterality: [name];  7:30am  ? ORIF ANKLE FRACTURE Right 06/02/2021  ? Procedure: OPEN REDUCTION INTERNAL FIXATION (ORIF) ANKLE FRACTURE;  Surgeon: Carole Civil, MD;  [facility]: [facility];  Service: Orthopedics;  Laterality: Right;  ? POLYPECTOMY  03/08/2016  ? Procedure: POLYPECTOMY;  Surgeon: Daneil Dolin, MD;  [facility];  Service: Endoscopy;;  cecum and descending   ? ? ?Current Outpatient Medications  ?Medication Sig Dispense Refill  ? HYDROcodone-acetaminophen (NORCO/VICODIN) 5-325 MG tablet Take 1 tablet by mouth every 6 (six) hours as needed for moderate pain. (Patient not taking: Reported on 08/10/2021) 30 tablet 0  ? ibuprofen (ADVIL) 800 MG tablet Take 1 tablet (800 mg total) by  mouth every 8 (eight) hours as needed. 90 tablet 1  ? lactulose (CHRONULAC) 10 GM/15ML solution TAKE TWO TABLESPOONFUL (30 ML) BY MOUTH THREE TIMES DAILY (Patient taking differently: Take 10 g by mouth as needed for moderate constipation.) 1892 mL 5  ? nadolol (CORGARD) 20 MG tablet TAKE 1 TABLET BY MOUTH TWICE A DAY (Patient not taking: Reported on 08/10/2021) 180 tablet 1  ? pantoprazole (PROTONIX) 40  MG tablet TAKE ONE TABLET BY MOUTH ONCE DAILY IN THE MORNING BEFORE BREAKFAST (Patient not taking: Reported on 08/10/2021) 90 tablet 3  ? promethazine (PHENERGAN) 12.5 MG tablet Take 1 tablet (12.5 mg total) by mouth every 6 (six) hours as needed for nausea or vomiting. (Patient not taking: Reported on 08/10/2021) 30 tablet 0  ? torsemide (DEMADEX) 20 MG tablet TAKE 1 TABLET BY MOUTH DAILY TAKE 1/2 TABLE WHEN PATIENT HAS EXTRA SWELLING (Patient taking differently: Take 10-20 mg by mouth as needed (fluid).) 135 tablet 1  ? polyethylene glycol-electrolytes (TRILYTE) 420 g solution Take 4,000 mLs by mouth as directed. 4000 mL 0  ? ?No current facility-administered medications for this visit.  ? ? ?Allergies as of 08/01/2021  ? (No Known Allergies)  ? ? ?Family History  ?Problem Relation Age of Onset  ? Colon cancer Neg Hx   ? Liver disease Neg Hx   ? ? ?Social History  ? ?Socioeconomic History  ? Marital status: Married  ?  Spouse name: Not on file  ? Number of children: Not on file  ? Years of education: Not on file  ? Highest education level: Not on file  ?Occupational History  ? Not on file  ?Tobacco Use  ? Smoking status: Former  ?  Packs/day: 0.25  ?  Types: Cigarettes  ?  Quit date: 2016  ?  Years since quitting: 7.2  ? Smokeless tobacco: Never  ?Vaping Use  ? Vaping Use: Never used  ?Substance and Sexual Activity  ? Alcohol use: No  ?  Comment: former--quit after 12/2015 hospitalization  ? Drug use: No  ? Sexual activity: Yes  ?Other Topics Concern  ? Not on file  ?Social History Narrative  ? MARRIED. 2 KIDS-7 GRANDBABIES. SPENDS FREE TIME: WITH GRANDCHILDREN, AND WATCHING TV.  ? ?Social Determinants of Health  ? ?Financial Resource Strain: Not on file  ?Food Insecurity: Not on file  ?Transportation Needs: Not on file  ?Physical Activity: Not on file  ?Stress: Not on file  ?Social Connections: Not on file  ?Intimate Partner Violence: Not on file  ? ? ? ?Review of Systems  ? ?Gen: Denies any fever, chills, fatigue,  weight loss, lack of appetite.  ?CV: Denies chest pain, heart palpitations, peripheral edema, syncope.  ?Resp: Denies shortness of breath at rest or with exertion. Denies wheezing or cough.  ?GI: see HPI ?GU : Denies urinary burning, urinary frequency, urinary hesitancy ?MS: Denies joint pain, muscle weakness, cramps, or limitation of movement.  ?Derm: Denies rash, itching, dry skin ?Psych: Denies depression, anxiety, memory loss, and confusion ?Heme: Denies bruising, bleeding, and enlarged lymph nodes. ? ? ?Physical Exam  ? ?BP 110/80   Pulse (!) 110   Temp (!) 97.5 ?F (36.4 ?C) (Temporal)   Ht '6\' 2"'$  (1.88 m)   Wt (!) 336 lb 6.4 oz (152.6 kg)   BMI 43.19 kg/m?  ?General:   Alert and oriented. Pleasant and cooperative. Well-nourished and well-developed.  ?Head:  Normocephalic and atraumatic. ?Eyes:  Without icterus ?Abdomen:  +BS, soft, non-tender and non-distended.  No HSM noted. No guarding or rebound. No masses appreciated.  ?Rectal:  Deferred  ?Msk:  Symmetrical without gross deformities. Normal posture. ?Extremities:  Without edema. ?Neurologic:  Alert and  oriented x4;  grossly normal neurologically. ?Skin:  Intact without significant lesions or rashes. ?Psych:  Alert and cooperative. Normal mood and affect. ? ?Lab Results  ?Component Value Date  ? ALT 21 05/31/2021  ? AST 53 (H) 05/31/2021  ? ALKPHOS 195 (H) 05/31/2021  ? BILITOT 2.1 (H) 05/31/2021  ? ?Lab Results  ?Component Value Date  ? WBC 4.4 05/31/2021  ? HGB 10.7 (L) 05/31/2021  ? HCT 35.3 (L) 05/31/2021  ? MCV 84.7 05/31/2021  ? PLT 234 05/31/2021  ? ?Lab Results  ?Component Value Date  ? CREATININE 0.84 05/31/2021  ? BUN 11 05/31/2021  ? NA 135 05/31/2021  ? K 4.4 05/31/2021  ? CL 103 05/31/2021  ? CO2 25 05/31/2021  ? ? ? ?Assessment  ? ?Antonio [name] is a 51 y.o. male presenting today in follow-up with a history of  cirrhosis due to ETOH, IDA, esophageal varices, H.pylori gastritis in 2020 with need to document eradication, tubular adenomas and  overdue for surveillance,chronic GERD, remote history of encephalopathy.  ? ? ?Cirrhosis: MELD NA 11. well-compensated now. Has remained abstinent for 6 years. Demadex only as needed if mild fluid retention

## 2021-08-01 NOTE — Patient Instructions (Addendum)
We are arranging a colonoscopy and upper endoscopy with Dr. Abbey Chatters in the near future.  ? ?Please stop your reflux medication (pantoprazole) for 14 days, then get the breath test done. You can get all the labs done in 2 weeks when you go for the breath test.  ? ?We need to taper off of the Nadolol, as you are feeling dizzy and blood pressure on lower end. I recommend taking it just once per day for the next week, then every other day for a week, then stop. You don't want to stop this abruptly without tapering off! ? ?We have ordered an ultrasound of your liver! ? ?We will see you in 6 months! ? ?I enjoyed seeing you again today! As you know, I value our relationship and want to provide genuine, compassionate, and quality care. I welcome your feedback. If you receive a survey regarding your visit,  I greatly appreciate you taking time to fill this out. See you next time! ? ?Annitta Needs, PhD, ANP-BC ?[facility]  ? ? ? ?

## 2021-08-01 NOTE — Telephone Encounter (Signed)
Pt consented to a virtual visit. 

## 2021-08-01 NOTE — Progress Notes (Unsigned)
2020 Four columns of grade I, grade II varices with no bleeding and no       stigmata of recent bleeding were found in the lower third of the       esophagus,. No red wale signs were present.      Moderate portal hypertensive gastropathy was found in the cardia, in the       gastric fundus and in the gastric body.      Localized moderate inflammation characterized by congestion (edema),       erosions and erythema was found in the gastric antrum.       Biopsies(2;BODY,1;INCISURA,2:ANTRUM) were taken with a cold forceps for       Helicobacter pylori testing.      The examined duodenum was normal. Impression:               - Grade I and grade II esophageal varices with no                            bleeding and no stigmata of recent bleeding.                           - MODERATE Portal hypertensive gastropathy.                           - MILD EROSIVE Gastritis.  + H.pylori     Colonoscopy 2017: One 8 mm polyp in the descending colon, removed with a hot snare. Resected and Retrieved.One 4 mm polyp in the cecum, removed with a cold snare. Resected and retrieved. Rectal varices. Melanosis coli. Abnormal left colon mucosa status post biopsy. Tubular adenomas. Surveillance was due 2022.    EGD 2017: Grade II esophageal varices . - Portal hypertensive gastropathy. - Normal duodenal bulb and second portion of the duodenum.   [name]    Protonix once daily. No encephalopathy. No jaundice or pruritis. No abdominal pain. No constipation. No rectal bleeding. No ETOH for 6 years. 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    HR 80 at rest. BP110/80      Gastroenterology Office Note     Primary Care Physician:  Monico Blitz, MD  Primary Gastroenterologist:   Chief Complaint   Chief Complaint  Patient presents with   Follow-up   Cirrhosis     History of Present Illness   Antonio [name] is a 51 y.o. male presenting today in follow-up with a  history of        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[place])    History of gout     Past Surgical History:  Procedure Laterality Date   BIOPSY  03/08/2016   Procedure: BIOPSY;  Surgeon: Daneil Dolin, MD;  [facility];  Service: Endoscopy;;  descending colon biopsies   BIOPSY  04/14/2019   Procedure: BIOPSY;  Surgeon: Danie Binder, MD;  [facility];  Service: Endoscopy;;   Colonoscopy     per patient around 2014 in [place] 03/08/2016   One 8 mm polyp in the descending colon, removed with a hot snare. Resected and Retrieved.One 4 mm polyp in the cecum, removed with a cold snare. Resected and retrieved. Rectal varices. Melanosis coli.  Abnormal left colon mucosa status post biopsy. Tubular adenomas. Surveillance was due 2022.   ESOPHAGOGASTRODUODENOSCOPY (EGD) WITH PROPOFOL [name] 12/28/2015   Dr. Oneida Alar: 4 columns of esophageal varices, 3 grade 1, 1 grade 2 with no evidence of bleeding. Mild portal gastropathy with no evidence of active bleeding.   ESOPHAGOGASTRODUODENOSCOPY (EGD) WITH PROPOFOL [name] 03/08/2016   Procedure: ESOPHAGOGASTRODUODENOSCOPY (EGD) WITH PROPOFOL;  Surgeon: Daneil Dolin, MD;  [facility];  Service: Endoscopy;  Laterality: [name];   ESOPHAGOGASTRODUODENOSCOPY (EGD) WITH PROPOFOL [name] 04/14/2019   Grade 1 and 2 esophageal varices without bleeding and no stigmata of bleeding. moderate portal gastropath, mild erosive gastritis. Path with positive H.pylori   GIVENS CAPSULE STUDY [name] 07/05/2017   Procedure: GIVENS CAPSULE STUDY;  Surgeon: Danie Binder, MD;  [facility];  Service: Endoscopy;  Laterality: [name];  7:30am   GIVENS CAPSULE STUDY [name] 08/05/2017   Procedure: GIVENS CAPSULE STUDY;  Surgeon: Danie Binder, MD;  [facility];  Service: Endoscopy;  Laterality: [name];  7:30am   ORIF ANKLE FRACTURE Right  06/02/2021   Procedure: OPEN REDUCTION INTERNAL FIXATION (ORIF) ANKLE FRACTURE;  Surgeon: Carole Civil, MD;  [facility]: [facility];  Service: Orthopedics;  Laterality: Right;   POLYPECTOMY  03/08/2016   Procedure: POLYPECTOMY;  Surgeon: Daneil Dolin, MD;  [facility];  Service: Endoscopy;;  cecum and descending     Current Outpatient Medications  Medication Sig Dispense Refill   HYDROcodone-acetaminophen (NORCO/VICODIN) 5-325 MG tablet Take 1 tablet by mouth every 6 (six) hours as needed for moderate pain. 30 tablet 0   ibuprofen (ADVIL) 800 MG tablet Take 1 tablet (800 mg total) by mouth every 8 (eight) hours as needed. 90 tablet 1   lactulose (CHRONULAC) 10 GM/15ML solution TAKE TWO TABLESPOONFUL (30 ML) BY MOUTH THREE TIMES DAILY (Patient taking differently: as needed for moderate constipation. 30 mls 3 times a day) 1892 mL 5   nadolol (CORGARD) 20 MG tablet TAKE 1 TABLET BY MOUTH TWICE A DAY 180 tablet 1   pantoprazole (PROTONIX) 40 MG tablet TAKE ONE TABLET BY MOUTH ONCE DAILY IN THE MORNING BEFORE BREAKFAST (Patient taking differently: Take 40 mg by mouth daily. TAKE ONE TABLET BY MOUTH ONCE DAILY IN THE MORNING BEFORE BREAKFAST) 90 tablet 3   promethazine (PHENERGAN) 12.5 MG tablet Take 1 tablet (12.5 mg total) by mouth every 6 (six) hours as needed for nausea or vomiting. (Patient taking differently: Take 12.5 mg by mouth as needed for nausea or vomiting.) 30 tablet 0   torsemide (DEMADEX) 20 MG tablet TAKE 1 TABLET BY MOUTH DAILY TAKE 1/2 TABLE WHEN PATIENT HAS EXTRA SWELLING (Patient taking differently: Take 20 mg by mouth as needed (fluid).) 135 tablet 1   polyethylene glycol-electrolytes (TRILYTE) 420 g solution Take 4,000 mLs by mouth as directed. 4000 mL 0   No current facility-administered medications for this visit.    Allergies as of 08/01/2021   (No Known Allergies)    Family History  Problem Relation Age of Onset   Colon cancer Neg Hx    Liver disease Neg  Hx     Social History   Socioeconomic History   Marital status: Married    Spouse name: Not on file   Number of children: Not on file   Years of education: Not on file   Highest education level: Not on file  Occupational History   Not on file  Tobacco Use   Smoking status: Former  Packs/day: 0.25    Types: Cigarettes    Quit date: 2016    Years since quitting: 7.2   Smokeless tobacco: Never  Vaping Use   Vaping Use: Never used  Substance and Sexual Activity   Alcohol use: No    Comment: former--quit after 12/2015 hospitalization   Drug use: No   Sexual activity: Yes  Other Topics Concern   Not on file  Social History Narrative   MARRIED. 2 KIDS-7 GRANDBABIES. SPENDS FREE TIME: WITH GRANDCHILDREN, AND WATCHING TV.   Social Determinants of Health   Financial Resource Strain: Not on file  Food Insecurity: Not on file  Transportation [name]: Not on file  Physical Activity: Not on file  Stress: Not on file  Social Connections: Not on file  Intimate Partner Violence: Not on file     Review of Systems   Gen: Denies any fever, chills, fatigue, weight loss, lack of appetite.  CV: Denies chest pain, heart palpitations, peripheral edema, syncope.  Resp: Denies shortness of breath at rest or with exertion. Denies wheezing or cough.  GI: Denies dysphagia or odynophagia. Denies jaundice, hematemesis, fecal incontinence. GU : Denies urinary burning, urinary frequency, urinary hesitancy MS: Denies joint pain, muscle weakness, cramps, or limitation of movement.  Derm: Denies rash, itching, dry skin Psych: Denies depression, anxiety, memory loss, and confusion Heme: Denies bruising, bleeding, and enlarged lymph nodes.   Physical Exam   BP 110/80    Pulse (!) 110    Temp (!) 97.5 F (36.4 C) (Temporal)    Ht '6\' 2"'$  (1.88 m)    Wt (!) 336 lb 6.4 oz (152.6 kg)    BMI 43.19 kg/m  General:   Alert and oriented. Pleasant and cooperative. Well-nourished and well-developed.  Head:   Normocephalic and atraumatic. Eyes:  Without icterus Abdomen:  +BS, soft, non-tender and non-distended. No HSM noted. No guarding or rebound. No masses appreciated.  Rectal:  Deferred  Msk:  Symmetrical without gross deformities. Normal posture. Extremities:  Without edema. Neurologic:  Alert and  oriented x4;  grossly normal neurologically. Skin:  Intact without significant lesions or rashes. Psych:  Alert and cooperative. Normal mood and affect.   Assessment   Styles Vannote is a 51 y.o. male presenting today in follow-up with a history of    PLAN   *****    Antonio Needs, PhD, ANP-BC [facility]

## 2021-08-01 NOTE — Telephone Encounter (Signed)
Antonio [name], you are scheduled for a virtual visit with your provider today.  Just as we do with appointments in the office, we must obtain your consent to participate.  Your consent will be active for this visit and any virtual visit you may have with one of our providers in the next 365 days.  If you have a MyChart account, I can also send a copy of this consent to you electronically.  All virtual visits are billed to your insurance company just like a traditional visit in the office.  As this is a virtual visit, video technology does not allow for your provider to perform a traditional examination.  This may limit your provider's ability to fully assess your condition.  If your provider identifies any concerns that need to be evaluated in person or the need to arrange testing such as labs, EKG, etc, we will make arrangements to do so.  Although advances in technology are sophisticated, we cannot ensure that it will always work on either your end or our end.  If the connection with a video visit is poor, we may have to switch to a telephone visit.  With either a video or telephone visit, we are not always able to ensure that we have a secure connection.   I need to obtain your verbal consent now.   Are you willing to proceed with your visit today?  ?

## 2021-08-02 LAB — IRON,TIBC AND FERRITIN PANEL
%SAT: 5 % (calc) — ABNORMAL LOW (ref 20–48)
Ferritin: 6 ng/mL — ABNORMAL LOW (ref 38–380)
Iron: 16 ug/dL — ABNORMAL LOW (ref 50–180)
TIBC: 328 mcg/dL (calc) (ref 250–425)

## 2021-08-02 LAB — H. PYLORI BREATH TEST: H. pylori Breath Test: NOT DETECTED

## 2021-08-07 ENCOUNTER — Encounter: Payer: Self-pay | Admitting: Gastroenterology

## 2021-08-07 DIAGNOSIS — Z8619 Personal history of other infectious and parasitic diseases: Secondary | ICD-10-CM | POA: Insufficient documentation

## 2021-08-08 ENCOUNTER — Ambulatory Visit (HOSPITAL_COMMUNITY)
Admission: RE | Admit: 2021-08-08 | Discharge: 2021-08-08 | Disposition: A | Discharge status: Home / Self Care | Payer: 59 | Source: Ambulatory Visit | Attending: Gastroenterology | Admitting: Gastroenterology

## 2021-08-08 ENCOUNTER — Other Ambulatory Visit: Payer: Self-pay

## 2021-08-08 DIAGNOSIS — K7030 Alcoholic cirrhosis of liver without ascites: Secondary | ICD-10-CM | POA: Diagnosis present

## 2021-08-11 ENCOUNTER — Other Ambulatory Visit: Payer: Self-pay

## 2021-08-11 DIAGNOSIS — K7689 Other specified diseases of liver: Secondary | ICD-10-CM

## 2021-08-11 NOTE — Progress Notes (Unsigned)
m °

## 2021-08-14 NOTE — Patient Instructions (Signed)
? ? ? ? ? ? ? ? Antonio [name] ? 08/14/2021  ?  ? '@PREFPERIOPPHARMACY'$ @ ? ? Your procedure is scheduled on  08/21/2021. ? ? Report to [facility] at  [place]. ? ? Call this number if you have problems the morning of surgery: ? [phone] ? ? Remember: ? Follow the diet and prep instructions given to you by the office. ?  ? Take these medicines the morning of surgery with A SIP OF WATER  ? ?None. ? ?  ? Do not wear jewelry, make-up or nail polish. ? Do not wear lotions, powders, or perfumes, or deodorant. ? Do not shave 48 hours prior to surgery.  Men may shave face and neck. ? Do not bring valuables to the hospital. ? [facility] is not responsible for any belongings or valuables. ? ?Contacts, dentures or bridgework may not be worn into surgery.  Leave your suitcase in the car.  After surgery it may be brought to your room. ? ?For patients admitted to the hospital, discharge time will be determined by your treatment team. ? ?Patients discharged the day of surgery will not be allowed to drive home and must have someone with them for 24 hours.  ? ? ? ?Special instructions:   DO NOT smoke tobacco or vape for 24 hours before your procedure. ? ?Please read over the following fact sheets that you were given. ?Anesthesia Post-op Instructions and Care and Recovery After Surgery ?  ? ? ? Upper Endoscopy, Adult, Care After ?This sheet gives you information about how to care for yourself after your procedure. Your health care provider may also give you more specific instructions. If you have problems or questions, contact your health care provider. ?What can I expect after the procedure? ?After the procedure, it is common to have: ?A sore throat. ?Mild stomach pain or discomfort. ?Bloating. ?Nausea. ?Follow these instructions at home: ? ?Follow instructions from your health care provider about what to eat or drink after your procedure. ?Return to your normal activities as told by your health care provider. Ask your health care  provider what activities are safe for you. ?Take over-the-counter and prescription medicines only as told by your health care provider. ?If you were given a sedative during the procedure, it can affect you for several hours. Do not drive or operate machinery until your health care provider says that it is safe. ?Keep all follow-up visits as told by your health care provider. This is important. ?Contact a health care provider if you have: ?A sore throat that lasts longer than one day. ?Trouble swallowing. ?Get help right away if: ?You vomit blood or your vomit looks like coffee grounds. ?You have: ?A fever. ?Bloody, black, or tarry stools. ?A severe sore throat or you cannot swallow. ?Difficulty breathing. ?Severe pain in your chest or abdomen. ?Summary ?After the procedure, it is common to have a sore throat, mild stomach discomfort, bloating, and nausea. ?If you were given a sedative during the procedure, it can affect you for several hours. Do not drive or operate machinery until your health care provider says that it is safe. ?Follow instructions from your health care provider about what to eat or drink after your procedure. ?Return to your normal activities as told by your health care provider. ?This information is not intended to replace advice given to you by your health care provider. Make sure you discuss any questions you have with your health care provider. ?Document Revised: 03/20/2019 Document Reviewed: 10/14/2017 ?Elsevier  Patient Education ? [place]. ?Colonoscopy, Adult, Care After ?This sheet gives you information about how to care for yourself after your procedure. Your health care provider may also give you more specific instructions. If you have problems or questions, contact your health care provider. ?What can I expect after the procedure? ?After the procedure, it is common to have: ?A small amount of blood in your stool for 24 hours after the procedure. ?Some gas. ?Mild cramping or  bloating of your abdomen. ?Follow these instructions at home: ?Eating and drinking ? ?Drink enough fluid to keep your urine pale yellow. ?Follow instructions from your health care provider about eating or drinking restrictions. ?Resume your normal diet as instructed by your health care provider. Avoid heavy or fried foods that are hard to digest. ?Activity ?Rest as told by your health care provider. ?Avoid sitting for a long time without moving. Get up to take short walks every 1-2 hours. This is important to improve blood flow and breathing. Ask for help if you feel weak or unsteady. ?Return to your normal activities as told by your health care provider. Ask your health care provider what activities are safe for you. ?Managing cramping and bloating ? ?Try walking around when you have cramps or feel bloated. ?Apply heat to your abdomen as told by your health care provider. Use the heat source that your health care provider recommends, such as a moist heat pack or a heating pad. ?Place a towel between your skin and the heat source. ?Leave the heat on for 20-30 minutes. ?Remove the heat if your skin turns bright red. This is especially important if you are unable to feel pain, heat, or cold. You may have a greater risk of getting burned. ?General instructions ?If you were given a sedative during the procedure, it can affect you for several hours. Do not drive or operate machinery until your health care provider says that it is safe. ?For the first 24 hours after the procedure: ?Do not sign important documents. ?Do not drink alcohol. ?Do your regular daily activities at a slower pace than normal. ?Eat soft foods that are easy to digest. ?Take over-the-counter and prescription medicines only as told by your health care provider. ?Keep all follow-up visits as told by your health care provider. This is important. ?Contact a health care provider if: ?You have blood in your stool 2-3 days after the procedure. ?Get help  right away if you have: ?More than a small spotting of blood in your stool. ?Large blood clots in your stool. ?Swelling of your abdomen. ?Nausea or vomiting. ?A fever. ?Increasing pain in your abdomen that is not relieved with medicine. ?Summary ?After the procedure, it is common to have a small amount of blood in your stool. You may also have mild cramping and bloating of your abdomen. ?If you were given a sedative during the procedure, it can affect you for several hours. Do not drive or operate machinery until your health care provider says that it is safe. ?Get help right away if you have a lot of blood in your stool, nausea or vomiting, a fever, or increased pain in your abdomen. ?This information is not intended to replace advice given to you by your health care provider. Make sure you discuss any questions you have with your health care provider. ?Document Revised: 03/20/2019 Document Reviewed: 12/08/2018 ?Elsevier Patient Education ? [place]. ?Monitored Anesthesia Care, Care After ?This sheet gives you information about how to care for yourself  after your procedure. Your health care provider may also give you more specific instructions. If you have problems or questions, contact your health care provider. ?What can I expect after the procedure? ?After the procedure, it is common to have: ?Tiredness. ?Forgetfulness about what happened after the procedure. ?Impaired judgment for important decisions. ?Nausea or vomiting. ?Some difficulty with balance. ?Follow these instructions at home: ?For the time period you were told by your health care provider: ?  ?Rest as needed. ?Do not participate in activities where you could fall or become injured. ?Do not drive or use machinery. ?Do not drink alcohol. ?Do not take sleeping pills or medicines that cause drowsiness. ?Do not make important decisions or sign legal documents. ?Do not take care of children on your own. ?Eating and drinking ?Follow the diet that  is recommended by your health care provider. ?Drink enough fluid to keep your urine pale yellow. ?If you vomit: ?Drink water, juice, or soup when you can drink without vomiting. ?Make sure you have little or

## 2021-08-16 ENCOUNTER — Other Ambulatory Visit: Payer: Self-pay

## 2021-08-16 ENCOUNTER — Encounter (HOSPITAL_COMMUNITY)
Admission: RE | Admit: 2021-08-16 | Discharge: 2021-08-16 | Disposition: A | Discharge status: Home / Self Care | Payer: 59 | Source: Ambulatory Visit | Attending: Internal Medicine | Admitting: Internal Medicine

## 2021-08-16 ENCOUNTER — Encounter (HOSPITAL_COMMUNITY): Payer: Self-pay

## 2021-08-21 ENCOUNTER — Encounter (HOSPITAL_COMMUNITY): Payer: Self-pay

## 2021-08-21 ENCOUNTER — Encounter (HOSPITAL_COMMUNITY)
Admission: RE | Disposition: A | Discharge status: Home / Self Care | Payer: Self-pay | Source: Home / Self Care | Attending: Internal Medicine

## 2021-08-21 ENCOUNTER — Ambulatory Visit (HOSPITAL_BASED_OUTPATIENT_CLINIC_OR_DEPARTMENT_OTHER): Payer: 59 | Admitting: Certified Registered"

## 2021-08-21 ENCOUNTER — Ambulatory Visit (HOSPITAL_COMMUNITY): Payer: 59 | Admitting: Certified Registered"

## 2021-08-21 ENCOUNTER — Ambulatory Visit (HOSPITAL_COMMUNITY)
Admission: RE | Admit: 2021-08-21 | Discharge: 2021-08-21 | Disposition: A | Discharge status: Home / Self Care | Payer: 59 | Attending: Internal Medicine | Admitting: Internal Medicine

## 2021-08-21 ENCOUNTER — Other Ambulatory Visit: Payer: Self-pay

## 2021-08-21 DIAGNOSIS — K7030 Alcoholic cirrhosis of liver without ascites: Secondary | ICD-10-CM | POA: Diagnosis present

## 2021-08-21 DIAGNOSIS — D638 Anemia in other chronic diseases classified elsewhere: Secondary | ICD-10-CM | POA: Diagnosis not present

## 2021-08-21 DIAGNOSIS — Z8719 Personal history of other diseases of the digestive system: Secondary | ICD-10-CM | POA: Diagnosis not present

## 2021-08-21 DIAGNOSIS — I8510 Secondary esophageal varices without bleeding: Secondary | ICD-10-CM | POA: Diagnosis not present

## 2021-08-21 DIAGNOSIS — K766 Portal hypertension: Secondary | ICD-10-CM

## 2021-08-21 DIAGNOSIS — Z87891 Personal history of nicotine dependence: Secondary | ICD-10-CM | POA: Diagnosis not present

## 2021-08-21 DIAGNOSIS — K219 Gastro-esophageal reflux disease without esophagitis: Secondary | ICD-10-CM | POA: Diagnosis not present

## 2021-08-21 DIAGNOSIS — Z1211 Encounter for screening for malignant neoplasm of colon: Secondary | ICD-10-CM | POA: Insufficient documentation

## 2021-08-21 DIAGNOSIS — K3189 Other diseases of stomach and duodenum: Secondary | ICD-10-CM

## 2021-08-21 DIAGNOSIS — K635 Polyp of colon: Secondary | ICD-10-CM

## 2021-08-21 DIAGNOSIS — D509 Iron deficiency anemia, unspecified: Secondary | ICD-10-CM | POA: Diagnosis not present

## 2021-08-21 DIAGNOSIS — D125 Benign neoplasm of sigmoid colon: Secondary | ICD-10-CM

## 2021-08-21 DIAGNOSIS — K2980 Duodenitis without bleeding: Secondary | ICD-10-CM | POA: Diagnosis not present

## 2021-08-21 DIAGNOSIS — Z79899 Other long term (current) drug therapy: Secondary | ICD-10-CM | POA: Diagnosis not present

## 2021-08-21 DIAGNOSIS — I509 Heart failure, unspecified: Secondary | ICD-10-CM | POA: Diagnosis not present

## 2021-08-21 DIAGNOSIS — Z86010 Personal history of colonic polyps: Secondary | ICD-10-CM | POA: Insufficient documentation

## 2021-08-21 DIAGNOSIS — K648 Other hemorrhoids: Secondary | ICD-10-CM | POA: Diagnosis not present

## 2021-08-21 DIAGNOSIS — K2970 Gastritis, unspecified, without bleeding: Secondary | ICD-10-CM | POA: Diagnosis not present

## 2021-08-21 HISTORY — PX: ESOPHAGOGASTRODUODENOSCOPY (EGD) WITH PROPOFOL: SHX5813

## 2021-08-21 HISTORY — PX: POLYPECTOMY: SHX149

## 2021-08-21 HISTORY — PX: COLONOSCOPY WITH PROPOFOL: SHX5780

## 2021-08-21 HISTORY — PX: BIOPSY: SHX5522

## 2021-08-21 SURGERY — COLONOSCOPY WITH PROPOFOL
Anesthesia: General

## 2021-08-21 MED ORDER — PROPOFOL 10 MG/ML IV BOLUS
INTRAVENOUS | Status: DC | PRN
  Administered 2021-08-21: 20 mg via INTRAVENOUS
  Administered 2021-08-21: 100 mg via INTRAVENOUS
  Administered 2021-08-21: 20 mg via INTRAVENOUS

## 2021-08-21 MED ORDER — PANTOPRAZOLE SODIUM 40 MG PO TBEC: 40.0000 mg | DELAYED_RELEASE_TABLET | Freq: Two times a day (BID) | ORAL | 5 refills | Status: DC

## 2021-08-21 MED ORDER — LIDOCAINE 2% (20 MG/ML) 5 ML SYRINGE
INTRAMUSCULAR | Status: DC | PRN
  Administered 2021-08-21: 50 mg via INTRAVENOUS

## 2021-08-21 MED ORDER — LACTATED RINGERS IV SOLN: INTRAVENOUS | Status: DC

## 2021-08-21 MED ORDER — PROPOFOL 500 MG/50ML IV EMUL
INTRAVENOUS | Status: DC | PRN
  Administered 2021-08-21: 200 ug/kg/min via INTRAVENOUS

## 2021-08-21 NOTE — Discharge Instructions (Signed)
EGD ?Discharge instructions ?Please read the instructions outlined below and refer to this sheet in the next few weeks. These discharge instructions provide you with general information on caring for yourself after you leave the hospital. Your doctor may also give you specific instructions. While your treatment has been planned according to the most current medical practices available, unavoidable complications occasionally occur. If you have any problems or questions after discharge, please call your doctor. ?ACTIVITY ?You may resume your regular activity but move at a slower pace for the next 24 hours.  ?Take frequent rest periods for the next 24 hours.  ?Walking will help expel (get rid of) the air and reduce the bloated feeling in your abdomen.  ?No driving for 24 hours (because of the anesthesia (medicine) used during the test).  ?You may shower.  ?Do not sign any important legal documents or operate any machinery for 24 hours (because of the anesthesia used during the test).  ?NUTRITION ?Drink plenty of fluids.  ?You may resume your normal diet.  ?Begin with a light meal and progress to your normal diet.  ?Avoid alcoholic beverages for 24 hours or as instructed by your caregiver.  ?MEDICATIONS ?You may resume your normal medications unless your caregiver tells you otherwise.  ?WHAT YOU CAN EXPECT TODAY ?You may experience abdominal discomfort such as a feeling of fullness or ?gas? pains.  ?FOLLOW-UP ?Your doctor will discuss the results of your test with you.  ?SEEK IMMEDIATE MEDICAL ATTENTION IF ANY OF THE FOLLOWING OCCUR: ?Excessive nausea (feeling sick to your stomach) and/or vomiting.  ?Severe abdominal pain and distention (swelling).  ?Trouble swallowing.  ?Temperature over 101? F (37.8? C).  ?Rectal bleeding or vomiting of blood.  ? ? ?Colonoscopy ?Discharge Instructions ? ?Read the instructions outlined below and refer to this sheet in the next few weeks. These discharge instructions provide you with  general information on caring for yourself after you leave the hospital. Your doctor may also give you specific instructions. While your treatment has been planned according to the most current medical practices available, unavoidable complications occasionally occur.  ? ?ACTIVITY ?You may resume your regular activity, but move at a slower pace for the next 24 hours.  ?Take frequent rest periods for the next 24 hours.  ?Walking will help get rid of the air and reduce the bloated feeling in your belly (abdomen).  ?No driving for 24 hours (because of the medicine (anesthesia) used during the test).   ?Do not sign any important legal documents or operate any machinery for 24 hours (because of the anesthesia used during the test).  ?NUTRITION ?Drink plenty of fluids.  ?You may resume your normal diet as instructed by your doctor.  ?Begin with a light meal and progress to your normal diet. Heavy or fried foods are harder to digest and may make you feel sick to your stomach (nauseated).  ?Avoid alcoholic beverages for 24 hours or as instructed.  ?MEDICATIONS ?You may resume your normal medications unless your doctor tells you otherwise.  ?WHAT YOU CAN EXPECT TODAY ?Some feelings of bloating in the abdomen.  ?Passage of more gas than usual.  ?Spotting of blood in your stool or on the toilet paper.  ?IF YOU HAD POLYPS REMOVED DURING THE COLONOSCOPY: ?No aspirin products for 7 days or as instructed.  ?No alcohol for 7 days or as instructed.  ?Eat a soft diet for the next 24 hours.  ?FINDING OUT THE RESULTS OF YOUR TEST ?Not all test results are available  during your visit. If your test results are not back during the visit, make an appointment with your caregiver to find out the results. Do not assume everything is normal if you have not heard from your caregiver or the medical facility. It is important for you to follow up on all of your test results.  ?SEEK IMMEDIATE MEDICAL ATTENTION IF: ?You have more than a spotting of  blood in your stool.  ?Your belly is swollen (abdominal distention).  ?You are nauseated or vomiting.  ?You have a temperature over 101.  ?You have abdominal pain or discomfort that is severe or gets worse throughout the day.  ? ?Your EGD revealed mild amount inflammation in your stomach.  I took biopsies of this to rule out infection with a bacteria called H. pylori.  Await pathology results, my office will contact you.  You have significant inflammation in your small bowel with small ulcerations as well.  I am going to increase your pantoprazole to twice daily.  Avoid NSAIDs as best as you can. ? ?You do have 1 column of esophageal varices in your esophagus.  These are currently too small to band.  Would recommend continue medical treatment to prevent bleeding.  We can consider trialing a different beta-blocker such as propanolol to see if you tolerate better than nadolol. ? ?Your colonoscopy revealed 1 polyp(s) which I removed successfully. Await pathology results, my office will contact you. I recommend repeating colonoscopy in 5 years for surveillance purposes.  ? ?Follow-up with GI in 3 to 4 months. ? ?I hope you have a great rest of your week! ? ?Elon Alas. Abbey Chatters, D.O. ?Gastroenterology and Hepatology ?[facility] ? ? ?

## 2021-08-21 NOTE — Op Note (Signed)
[facility] ?Patient Name: Antonio [name] ?Procedure Date: 08/21/2021 11:06 AM ?MRN: [id number] ?Date of Birth: [birth year] ?Attending MD: Elon Alas. Abbey Chatters , DO ?CSN: [id number] ?Age: 51 ?Admit Type: Outpatient ?Procedure:                Upper GI endoscopy ?Indications:              Heartburn, Follow-up of esophageal varices ?Providers:                Elon Alas. Abbey Chatters, DO, [place] Page, [place]                          Risa Grill, Technician ?Referring MD:              ?Medicines:                See the Anesthesia note for documentation of the  ?                          administered medications ?Complications:            No immediate complications. ?Estimated Blood Loss:     Estimated blood loss was minimal. ?Procedure:                Pre-Anesthesia Assessment: ?                          - The anesthesia plan was to use monitored  ?                          anesthesia care (MAC). ?                          After obtaining informed consent, the endoscope was  ?                          passed under direct vision. Throughout the  ?                          procedure, the patient's blood pressure, pulse, and  ?                          oxygen saturations were monitored continuously. The  ?                          GIF-H190 ([id number]) scope was introduced through the  ?                          mouth, and advanced to the second part of duodenum.  ?                          The upper GI endoscopy was accomplished without  ?                          difficulty. The patient tolerated the procedure  ?                          well. ?Scope  In: 11:15:33 AM ?Scope Out: 11:18:02 AM ?Total Procedure Duration: 0 hours 2 minutes 29 seconds  ?Findings: ?     One column of grade I varices with no bleeding and no stigmata of recent  ?     bleeding were found in the lower third of the esophagus,. No red wale  ?     signs were present. ?     Diffuse moderate inflammation characterized by erosions and erythema was  ?     found  in the entire examined stomach. Biopsies were taken with a cold  ?     forceps for Helicobacter pylori testing. ?     Portal hypertensive gastropathy was found in the entire examined stomach. ?     Localized moderate inflammation characterized by congestion (edema),  ?     erosions, erythema and shallow ulcerations was found in the duodenal  ?     bulb and in the first portion of the duodenum. ?Impression:               - Grade I esophageal varices with no bleeding and  ?                          no stigmata of recent bleeding. ?                          - Gastritis. Biopsied. ?                          - Portal hypertensive gastropathy. ?                          - Duodenitis. ?Moderate Sedation: ?     Per Anesthesia Care ?Recommendation:           - Patient has a contact number available for  ?                          emergencies. The signs and symptoms of potential  ?                          delayed complications were discussed with the  ?                          patient. Return to normal activities tomorrow.  ?                          Written discharge instructions were provided to the  ?                          patient. ?                          - Resume previous diet. ?                          - Continue present medications. ?                          - Await pathology results. ?                          -  Repeat upper endoscopy in 2 years for  ?                          surveillance. ?                          - Use Protonix (pantoprazole) 40 mg PO BID. ?                          - Consider switching to Propranolol to see if  ?                          tolerated better. ?                          - Return to GI clinic in 4 months. ?Procedure Code(s):        --- Professional --- ?                          [phone], Esophagogastroduodenoscopy, flexible,  ?                          transoral; with biopsy, single or multiple ?Diagnosis Code(s):        --- Professional --- ?                          I85.00,  Esophageal varices without bleeding ?                          K29.70, Gastritis, unspecified, without bleeding ?                          K76.6, Portal hypertension ?                          K31.89, Other diseases of stomach and duodenum ?                          K29.80, Duodenitis without bleeding ?                          R12, Heartburn ?CPT copyright 2019 American Medical Association. All rights reserved. ?The codes documented in this report are preliminary and upon coder review may  ?be revised to meet current compliance requirements. ?Elon Alas. Abbey Chatters, DO ?Elon Alas. [place], DO ?08/21/2021 11:23:52 AM ?This report has been signed electronically. ?Number of Addenda: 0 ?

## 2021-08-21 NOTE — Op Note (Signed)
[facility] ?Patient Name: Antonio [name] ?Procedure Date: 08/21/2021 11:21 AM ?MRN: [id number] ?Date of Birth: 1970-09-29 ?Attending MD: Elon Alas. Abbey Chatters , DO ?CSN: [id number] ?Age: 51 ?Admit Type: Outpatient ?Procedure:                Colonoscopy ?Indications:              Surveillance: Personal history of adenomatous  ?                          polyps on last colonoscopy > 5 years ago ?Providers:                Elon Alas. Abbey Chatters, DO, [place] Page, [place]                          Risa Grill, Technician ?Referring MD:              ?Medicines:                See the Anesthesia note for documentation of the  ?                          administered medications ?Complications:            No immediate complications. ?Estimated Blood Loss:     Estimated blood loss was minimal. ?Procedure:                Pre-Anesthesia Assessment: ?                          - The anesthesia plan was to use monitored  ?                          anesthesia care (MAC). ?                          After obtaining informed consent, the colonoscope  ?                          was passed under direct vision. Throughout the  ?                          procedure, the patient's blood pressure, pulse, and  ?                          oxygen saturations were monitored continuously. The  ?                          PCF-HQ190L ([id number]) scope was introduced through  ?                          the anus and advanced to the the cecum, identified  ?                          by appendiceal orifice and ileocecal valve. The  ?                          colonoscopy was performed  without difficulty. The  ?                          patient tolerated the procedure well. The quality  ?                          of the bowel preparation was evaluated using the  ?                          BBPS [facility] Bowel Preparation Scale) with scores  ?                          of: Right Colon = 3, Transverse Colon = 3 and Left  ?                          Colon = 3 (entire  mucosa seen well with no residual  ?                          staining, small fragments of stool or opaque  ?                          liquid). The total BBPS score equals 9. ?Scope In: 11:24:38 AM ?Scope Out: 11:36:35 AM ?Scope Withdrawal Time: 0 hours 7 minutes 7 seconds  ?Total Procedure Duration: 0 hours 11 minutes 57 seconds  ?Findings: ?     Hemorrhoids were found on perianal exam. ?     Non-bleeding internal hemorrhoids were found during endoscopy. ?     The exam was otherwise without abnormality. ?Impression:               - Hemorrhoids found on perianal exam. ?                          - Non-bleeding internal hemorrhoids. ?                          - One 2 mm polyp in the sigmoid colon, removed with  ?                          a cold biopsy forceps. Resected and retrieved. ?                          - The examination was otherwise normal. ?Moderate Sedation: ?     Per Anesthesia Care ?Recommendation:           - Patient has a contact number available for  ?                          emergencies. The signs and symptoms of potential  ?                          delayed complications were discussed with the  ?                          patient. Return to normal activities tomorrow.  ?  Written discharge instructions were provided to the  ?                          patient. ?                          - Resume previous diet. ?                          - Continue present medications. ?                          - Await pathology results. ?                          - Repeat colonoscopy in 5 years for surveillance. ?                          - Return to GI clinic in 4 months. ?Procedure Code(s):        --- Professional --- ?                          [id number], Colonoscopy, flexible; with biopsy, single  ?                          or multiple ?Diagnosis Code(s):        --- Professional --- ?                          Z86.010, Personal history of colonic polyps ?                          K64.8, Other  hemorrhoids ?                          K63.5, Polyp of colon ?CPT copyright 2019 American Medical Association. All rights reserved. ?The codes documented in this report are preliminary and upon coder review may  ?be revised to meet current compliance requirements. ?Elon Alas. Abbey Chatters, DO ?Elon Alas. [place], DO ?08/21/2021 11:40:24 AM ?This report has been signed electronically. ?Number of Addenda: 0 ?

## 2021-08-21 NOTE — Anesthesia Preprocedure Evaluation (Signed)
Anesthesia Evaluation  ?Patient identified by MRN, date of birth, ID band ?Patient awake ? ? ? ?Reviewed: ?Allergy & Precautions, H&P , NPO status , Patient's Chart, lab work & pertinent test results, reviewed documented beta blocker date and time  ? ?Airway ?Mallampati: II ? ?TM Distance: >3 FB ?Neck ROM: full ? ? ? Dental ?no notable dental hx. ? ?  ?Pulmonary ?neg pulmonary ROS, former smoker,  ?  ?Pulmonary exam normal ?breath sounds clear to auscultation ? ? ? ? ? ? Cardiovascular ?Exercise Tolerance: Good ?+CHF  ? ?Rhythm:regular Rate:Normal ? ? ?  ?Neuro/Psych ?Seizures -,  negative psych ROS  ? GI/Hepatic ?negative GI ROS, Neg liver ROS,   ?Endo/Other  ?negative endocrine ROS ? Renal/GU ?negative Renal ROS  ?negative genitourinary ?  ?Musculoskeletal ? ? Abdominal ?  ?Peds ? Hematology ? ?(+) Blood dyscrasia, anemia ,   ?Anesthesia Other Findings ? ? Reproductive/Obstetrics ?negative OB ROS ? ?  ? ? ? ? ? ? ? ? ? ? ? ? ? ?  ?  ? ? ? ? ? ? ? ? ?Anesthesia Physical ?Anesthesia Plan ? ?ASA: 3 ? ?Anesthesia Plan: General  ? ?Post-op Pain Management:   ? ?Induction:  ? ?PONV Risk Score and Plan: Propofol infusion ? ?Airway Management Planned:  ? ?Additional Equipment:  ? ?Intra-op Plan:  ? ?Post-operative Plan:  ? ?Informed Consent: I have reviewed the patients History and Physical, chart, labs and discussed the procedure including the risks, benefits and alternatives for the proposed anesthesia with the patient or authorized representative who has indicated his/her understanding and acceptance.  ? ? ? ?Dental Advisory Given ? ?Plan Discussed with: CRNA ? ?Anesthesia Plan Comments:   ? ? ? ? ? ? ?Anesthesia Quick Evaluation ? ?

## 2021-08-21 NOTE — Transfer of Care (Signed)
Immediate Anesthesia Transfer of Care Note ? ?Patient: Antonio [name] ? ?Procedure(s) Performed: COLONOSCOPY WITH PROPOFOL ?ESOPHAGOGASTRODUODENOSCOPY (EGD) WITH PROPOFOL ?BIOPSY ?POLYPECTOMY INTESTINAL ? ?Patient Location: Endoscopy Unit ? ?Anesthesia Type:MAC ? ?Level of Consciousness: awake, alert , oriented and patient cooperative ? ?Airway & Oxygen Therapy: Patient Spontanous Breathing and Patient connected to nasal cannula oxygen ? ?Post-op Assessment: Report given to RN, Post -op Vital signs reviewed and stable and Patient moving all extremities ? ?Post vital signs: Reviewed and stable ? ?Last Vitals:  ?Vitals Value Taken Time  ?BP    ?Temp    ?Pulse    ?Resp    ?SpO2    ? ? ?Last Pain:  ?Vitals:  ? 08/21/21 1111  ?TempSrc:   ?PainSc: 0-No pain  ?   ? ?  ? ?Complications: No notable events documented. ?

## 2021-08-21 NOTE — Interval H&P Note (Signed)
History and Physical Interval Note: ? ?08/21/2021 ?11:05 AM ? ?Antonio [name]  has presented today for surgery, with the diagnosis of history of polyps, variceal surveillance, cirrhosis.  The various methods of treatment have been discussed with the patient and family. After consideration of risks, benefits and other options for treatment, the patient has consented to  Procedure(s) with comments: ?COLONOSCOPY WITH PROPOFOL ([name]) - 11:15am ?ESOPHAGOGASTRODUODENOSCOPY (EGD) WITH PROPOFOL ([name]) as a surgical intervention.  The patient's history has been reviewed, patient examined, no change in status, stable for surgery.  I have reviewed the patient's chart and labs.  Questions were answered to the patient's satisfaction.   ? ? ?[name] ? ? ?

## 2021-08-22 NOTE — Anesthesia Postprocedure Evaluation (Signed)
Anesthesia Post Note ? ?Patient: Antonio [name] ? ?Procedure(s) Performed: COLONOSCOPY WITH PROPOFOL ?ESOPHAGOGASTRODUODENOSCOPY (EGD) WITH PROPOFOL ?BIOPSY ?POLYPECTOMY INTESTINAL ? ?Patient location during evaluation: Phase II ?Anesthesia Type: General ?Level of consciousness: awake ?Pain management: pain level controlled ?Vital Signs Assessment: post-procedure vital signs reviewed and stable ?Respiratory status: spontaneous breathing and respiratory function stable ?Cardiovascular status: blood pressure returned to baseline and stable ?Postop Assessment: no headache and no apparent nausea or vomiting ?Anesthetic complications: no ?Comments: Late entry ? ? ?No notable events documented. ? ? ?Last Vitals:  ?Vitals:  ? 08/21/21 1023 08/21/21 1147  ?BP: 127/84 111/67  ?Pulse: 90 83  ?Resp: 20 20  ?Temp: 36.4 ?C 36.4 ?C  ?SpO2: 99% 100%  ?  ?Last Pain:  ?Vitals:  ? 08/21/21 1147  ?TempSrc: Oral  ?PainSc: 0-No pain  ? ? ?  ?  ?  ?  ?  ?  ? ?Louann Sjogren ? ? ? ? ?

## 2021-08-23 ENCOUNTER — Ambulatory Visit: Payer: 59

## 2021-08-23 ENCOUNTER — Ambulatory Visit (INDEPENDENT_AMBULATORY_CARE_PROVIDER_SITE_OTHER): Payer: 59 | Admitting: Orthopedic Surgery

## 2021-08-23 ENCOUNTER — Other Ambulatory Visit: Payer: Self-pay

## 2021-08-23 DIAGNOSIS — S82851D Displaced trimalleolar fracture of right lower leg, subsequent encounter for closed fracture with routine healing: Secondary | ICD-10-CM

## 2021-08-23 LAB — SURGICAL PATHOLOGY

## 2021-08-23 NOTE — Patient Instructions (Signed)
Remove the boot, wear regular shoe use crutches for 3 weeks ? ?Return in 4 weeks ?

## 2021-08-23 NOTE — Progress Notes (Signed)
FOLLOW UP  ? ?Encounter Diagnosis  ?Name Primary?  ? Closed trimalleolar fracture of right ankle with routine healing, subsequent encounter Yes  ? ? ? ?Chief Complaint  ?Patient presents with  ? Follow-up  ?  Recheck on right ankle, DOS 05-23-21. ?  ? ? ? ?Mr. Antonio [name] is in his 12th week after ORIF medial lateral malleolus he is doing well ? ?He ambulated with his boot on as instructed and his x-ray shows no change in position of the fracture ? ?His skin looks good his skin is wrinkling ? ?He is going to use crutches for 3 weeks with the boot off and then see me in 4 weeks no x-rays anticipated at that time ?

## 2021-08-24 ENCOUNTER — Encounter (HOSPITAL_COMMUNITY): Payer: Self-pay | Admitting: Internal Medicine

## 2021-08-28 ENCOUNTER — Telehealth: Payer: Self-pay | Admitting: Internal Medicine

## 2021-08-28 NOTE — Telephone Encounter (Signed)
Returning call. [phone] or [phone] ?

## 2021-08-29 NOTE — Telephone Encounter (Signed)
See result note.  

## 2021-08-30 ENCOUNTER — Ambulatory Visit (HOSPITAL_COMMUNITY)
Admission: RE | Admit: 2021-08-30 | Discharge: 2021-08-30 | Disposition: A | Discharge status: Home / Self Care | Payer: 59 | Source: Ambulatory Visit | Attending: Gastroenterology | Admitting: Gastroenterology

## 2021-08-30 DIAGNOSIS — K7689 Other specified diseases of liver: Secondary | ICD-10-CM | POA: Insufficient documentation

## 2021-08-30 MED ORDER — GADOBUTROL 1 MMOL/ML IV SOLN
10.0000 mL | Freq: Once | INTRAVENOUS | Status: AC | PRN
  Administered 2021-08-30: 10 mL via INTRAVENOUS

## 2021-09-06 ENCOUNTER — Other Ambulatory Visit: Payer: Self-pay

## 2021-09-06 DIAGNOSIS — K7689 Other specified diseases of liver: Secondary | ICD-10-CM

## 2021-09-06 DIAGNOSIS — K7030 Alcoholic cirrhosis of liver without ascites: Secondary | ICD-10-CM

## 2021-09-06 DIAGNOSIS — K769 Liver disease, unspecified: Secondary | ICD-10-CM

## 2021-09-11 LAB — AFP TUMOR MARKER: AFP-Tumor Marker: 22.9 ng/mL — ABNORMAL HIGH (ref <6.1)

## 2021-09-12 ENCOUNTER — Other Ambulatory Visit: Payer: Self-pay | Admitting: *Deleted

## 2021-09-12 DIAGNOSIS — R772 Abnormality of alphafetoprotein: Secondary | ICD-10-CM

## 2021-09-12 DIAGNOSIS — K769 Liver disease, unspecified: Secondary | ICD-10-CM

## 2021-09-13 ENCOUNTER — Other Ambulatory Visit: Payer: Self-pay

## 2021-09-13 DIAGNOSIS — K7030 Alcoholic cirrhosis of liver without ascites: Secondary | ICD-10-CM

## 2021-09-13 DIAGNOSIS — K769 Liver disease, unspecified: Secondary | ICD-10-CM

## 2021-09-13 DIAGNOSIS — R772 Abnormality of alphafetoprotein: Secondary | ICD-10-CM

## 2021-09-13 NOTE — Addendum Note (Signed)
Addended by: Cheron Every on: 09/13/2021 11:56 AM ? ? Modules accepted: Orders ? ?

## 2021-09-19 ENCOUNTER — Ambulatory Visit (HOSPITAL_COMMUNITY): Payer: 59 | Admitting: Hematology

## 2021-09-22 ENCOUNTER — Encounter: Payer: Self-pay | Admitting: Orthopedic Surgery

## 2021-09-22 ENCOUNTER — Ambulatory Visit (INDEPENDENT_AMBULATORY_CARE_PROVIDER_SITE_OTHER): Payer: 59 | Admitting: Orthopedic Surgery

## 2021-09-22 DIAGNOSIS — S82851D Displaced trimalleolar fracture of right lower leg, subsequent encounter for closed fracture with routine healing: Secondary | ICD-10-CM

## 2021-09-22 MED ORDER — MELOXICAM 7.5 MG PO TABS: 7.5000 mg | ORAL_TABLET | Freq: Every day | ORAL | 0 refills | Status: AC

## 2021-09-22 NOTE — Progress Notes (Signed)
FOLLOW UP  ? ?Encounter Diagnosis  ?Name Primary?  ? Closed trimalleolar fracture of right ankle with routine healing, subsequent encounter Yes  ? ? ? ?Chief Complaint  ?Patient presents with  ? Ankle Pain  ? Follow-up  ?  06/02/21 DOS Rt ankle trimalleolar fx, WBAT with one crutch.  ? ? ? ?Antonio [name] is ready to get back on his treadmill ? ?He says he has a little soreness after he has been up and on his ankle for a while but otherwise is doing well ? ?He exhibited excellent range of motion without pain tenderness or swelling ? ?Recommend 1 year follow-up x-ray right ankle again the patient is status post ORIF medial and lateral malleolus for trimalleolar fracture. ?

## 2021-09-22 NOTE — Patient Instructions (Signed)
Follow up in 1 year w/ RT ankle xray  ?DOS 05/23/21 ?

## 2021-11-01 ENCOUNTER — Ambulatory Visit (INDEPENDENT_AMBULATORY_CARE_PROVIDER_SITE_OTHER): Payer: 59 | Admitting: Gastroenterology

## 2021-11-01 ENCOUNTER — Encounter: Payer: Self-pay | Admitting: Gastroenterology

## 2021-11-01 VITALS — BP 119/84 | HR 87 | Temp 98.7°F | Ht 74.0 in | Wt 342.2 lb

## 2021-11-01 DIAGNOSIS — K7030 Alcoholic cirrhosis of liver without ascites: Secondary | ICD-10-CM | POA: Diagnosis not present

## 2021-11-01 DIAGNOSIS — D500 Iron deficiency anemia secondary to blood loss (chronic): Secondary | ICD-10-CM

## 2021-11-01 NOTE — Progress Notes (Signed)
Gastroenterology Office Note     Primary Care Physician:  Monico Blitz, MD  Primary Gastroenterologist: Dr. Abbey Chatters   Chief Complaint   Chief Complaint  Patient presents with   Cirrhosis    Follow up on Cirrhosis. States no concerns today.      History of Present Illness   Antonio [name] is a 51 y.o. 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 Also found to have elevated AFP tumor mark 22 and MRI with liver lesions concerning for [place]. He has been referred to [facility] with upcoming appointment in next few weeks.  LR-4 and LR-3 lesions (probable and intermediate possibility).   Colonoscopy March 2023: hemorrhoids, one 2 mm polyp s/p removal ( benign aggregates).   EGD March 2023: Grade 1 esophageal varices, gastritis s/p biopsy, portal gastropathy, duodenitis. Repeat EGD in 2 years. Negative H.pylori.   Has not tolerated Nadolol historically.   No abdominal pain, N/V, changes in bowel habits, constipation, diarrhea, overt GI bleeding, GERD, dysphagia, unexplained weight loss, lack of appetite, unexplained weight gain. No jaundice, mental status changes, confusion.   Wife is present with him today.      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[place])    History of gout     Past Surgical History:  Procedure Laterality Date   BIOPSY  03/08/2016   Procedure: BIOPSY;  Surgeon: Daneil Dolin, MD;  [facility];  Service: Endoscopy;;  descending colon biopsies   BIOPSY  04/14/2019   Procedure: BIOPSY;  Surgeon: Danie Binder, MD;  [facility];  Service: Endoscopy;;   BIOPSY  08/21/2021   Procedure: BIOPSY;  Surgeon: [name], DO;  [facility];  Service: Endoscopy;;   Colonoscopy     per patient around 2014 in [place] PROPOFOL [name] 03/08/2016   One 8 mm polyp in the descending colon, removed with a hot snare. Resected and Retrieved.One 4 mm polyp in the cecum, removed with a cold snare. Resected and retrieved. Rectal varices. Melanosis coli. Abnormal left colon mucosa status post biopsy. Tubular adenomas. Surveillance was due 2022.   COLONOSCOPY WITH PROPOFOL [name] 08/21/2021   Procedure: COLONOSCOPY WITH PROPOFOL;  Surgeon: [name], DO;  [facility];  Service: Endoscopy;  Laterality: [name];  11:15am   ESOPHAGOGASTRODUODENOSCOPY (EGD) WITH PROPOFOL [name] 12/28/2015   Dr. Oneida Alar: 4 columns of esophageal varices, 3 grade 1, 1 grade 2 with no evidence of bleeding. Mild portal gastropathy with no evidence of active bleeding.   ESOPHAGOGASTRODUODENOSCOPY (EGD) WITH PROPOFOL [name] 03/08/2016   Procedure: ESOPHAGOGASTRODUODENOSCOPY (EGD) WITH PROPOFOL;  Surgeon: Daneil Dolin, MD;  [facility];  Service: Endoscopy;  Laterality: [name];   ESOPHAGOGASTRODUODENOSCOPY (EGD) WITH PROPOFOL [name] 04/14/2019   Grade 1 and 2 esophageal varices without bleeding and no stigmata of bleeding. moderate portal gastropath, mild erosive gastritis. Path with positive H.pylori   ESOPHAGOGASTRODUODENOSCOPY (EGD) WITH PROPOFOL [name] 08/21/2021   Procedure: ESOPHAGOGASTRODUODENOSCOPY (EGD) WITH PROPOFOL;  Surgeon: [name], DO;  [facility];  Service: Endoscopy;  Laterality: [name];   GIVENS CAPSULE STUDY [name] 07/05/2017   Procedure: GIVENS CAPSULE STUDY;  Surgeon: Danie Binder,  MD;  [facility];  Service: Endoscopy;  Laterality: [name];  7:30am   GIVENS CAPSULE STUDY [name] 08/05/2017   Procedure: GIVENS CAPSULE STUDY;  Surgeon: Danie Binder, MD;  [facility];  Service: Endoscopy;  Laterality: [name];  7:30am   ORIF ANKLE FRACTURE Right 06/02/2021   Procedure: OPEN REDUCTION  INTERNAL FIXATION (ORIF) ANKLE FRACTURE;  Surgeon: Carole Civil, MD;  Location: [facility];  Service: Orthopedics;  Laterality: Right;   POLYPECTOMY  03/08/2016   Procedure: POLYPECTOMY;  Surgeon: Daneil Dolin, MD;  [facility];  Service: Endoscopy;;  cecum and descending    POLYPECTOMY  08/21/2021   Procedure: POLYPECTOMY INTESTINAL;  Surgeon: [name], DO;  [facility];  Service: Endoscopy;;    Current Outpatient Medications  Medication Sig Dispense Refill   lactulose (CHRONULAC) 10 GM/15ML solution TAKE TWO TABLESPOONFUL (30 ML) BY MOUTH THREE TIMES DAILY (Patient taking differently: Take 10 g by mouth as needed for moderate constipation.) 1892 mL 5   pantoprazole (PROTONIX) 40 MG tablet Take 1 tablet (40 mg total) by mouth 2 (two) times daily before a meal. 60 tablet 5   torsemide (DEMADEX) 20 MG tablet TAKE 1 TABLET BY MOUTH DAILY TAKE 1/2 TABLE WHEN PATIENT HAS EXTRA SWELLING (Patient taking differently: Take 10-20 mg by mouth as needed (fluid).) 135 tablet 1   No current facility-administered medications for this visit.    Allergies as of 11/01/2021   (No Known Allergies)    Family History  Problem Relation Age of Onset   Colon cancer Neg Hx    Liver disease Neg Hx     Social History   Socioeconomic History   Marital status: Married    Spouse name: Not on file   Number of children: Not on file   Years of education: Not on file   Highest education level: Not on file  Occupational History   Not on file  Tobacco Use   Smoking status: Former    Packs/day: 0.25    Years: 8.00    Pack years: 2.00    Types: Cigarettes    Quit date: 2016    Years since quitting: 7.4    Passive exposure: Current   Smokeless tobacco: Never  Vaping Use   Vaping Use: Never used  Substance and Sexual Activity   Alcohol use: No    Comment: former--quit after 12/2015 hospitalization   Drug use: No   Sexual activity: Yes  Other Topics Concern   Not on  file  Social History Narrative   MARRIED. 2 KIDS-7 GRANDBABIES. SPENDS FREE TIME: WITH GRANDCHILDREN, AND WATCHING TV.   Social Determinants of Health   Financial Resource Strain: Not on file  Food Insecurity: Not on file  Transportation Needs: Not on file  Physical Activity: Not on file  Stress: Not on file  Social Connections: Not on file  Intimate Partner Violence: Not on file     Review of Systems   Gen: Denies any fever, chills, fatigue, weight loss, lack of appetite.  CV: Denies chest pain, heart palpitations, peripheral edema, syncope.  Resp: Denies shortness of breath at rest or with exertion. Denies wheezing or cough.  GI: Denies dysphagia or odynophagia. Denies jaundice, hematemesis, fecal incontinence. GU : Denies urinary burning, urinary frequency, urinary hesitancy MS: Denies joint pain, muscle weakness, cramps, or limitation of movement.  Derm: Denies rash, itching, dry skin Psych: Denies depression, anxiety, memory loss, and confusion Heme: Denies bruising, bleeding, and  enlarged lymph nodes.   Physical Exam   BP 119/84 (BP Location: Right Arm, Patient Position: Sitting, Cuff Size: Large)   Pulse 87   Temp 98.7 F (37.1 C) (Oral)   Ht '6\' 2"'$  (1.88 m)   Wt (!) 342 lb 3.2 oz (155.2 kg)   BMI 43.94 kg/m  General:   Alert and oriented. Pleasant and cooperative. Well-nourished and well-developed.  Head:  Normocephalic and atraumatic. Eyes:  Without icterus Abdomen:  +BS, soft, non-tender and non-distended. No HSM noted. No guarding or rebound. No masses appreciated.  Rectal:  Deferred  Msk:  Symmetrical without gross deformities. Normal posture. Extremities:  Without edema. Neurologic:  Alert and  oriented x4;  grossly normal neurologically. Skin:  Intact without significant lesions or rashes. Psych:  Alert and cooperative. Normal mood and affect.   Assessment   Antonio [name] is a 51 y.o. 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 He has been referred to [facility] with upcoming appointment in next few weeks.  LR-4 and LR-3 lesions (probable and intermediate possibility).    Cirrhosis: fairly well-compensated at this point but liver lesions concerning. [facility] is aware and has read the MRI independently as well. He will need MRI in July regardless; however, I am waiting to see what [facility] plans prior to ordering this. He is on recall regardless. EGD will be due in 2025. Grade 1 varices on EGD but unable to tolerate non-selective beta blocker in the past.   IDA: no obvious findings on colonoscopy/EGD. Portal gastropathy could contribute. Update labs now. May need to pursue capsule study. No overt GI bleeding.     PLAN    Keep upcoming appt at [place] in July: will be seeing [facility]. If not done at [facility], will need to be ordered here CBC, CMP, INR, iron studies today Holding off on non-selective beta blocker at patient request and pursuing serial EGDs 3 month follow-up   Annitta Needs, PhD, ANP-BC [facility] Gastroenterology

## 2021-11-01 NOTE — Patient Instructions (Addendum)
  Please complete labs when you leave here.  You will be seeing Dr. Erenest Rasher at [facility]. Please call [phone] to verify the actual time. It is very important to keep this appointment so you are plugged in. They will likely repeat MRI at that time. If not, we will need to repeat it.   I will see you in 3 months regardless!  I enjoyed seeing you again today! As you know, I value our relationship and want to provide genuine, compassionate, and quality care. I welcome your feedback. If you receive a survey regarding your visit,  I greatly appreciate you taking time to fill this out. See you next time!  Annitta Needs, PhD, ANP-BC [facility] Gastroenterology

## 2021-11-02 LAB — CBC WITH DIFFERENTIAL/PLATELET
Absolute Monocytes: 522 cells/uL (ref 200–950)
Basophils Absolute: 70 cells/uL (ref 0–200)
Basophils Relative: 2 %
Eosinophils Absolute: 210 cells/uL (ref 15–500)
Eosinophils Relative: 6 %
HCT: 30.6 % — ABNORMAL LOW (ref 38.5–50.0)
Hemoglobin: 8.3 g/dL — ABNORMAL LOW (ref 13.2–17.1)
Lymphs Abs: 1190 cells/uL (ref 850–3900)
MCH: 20.2 pg — ABNORMAL LOW (ref 27.0–33.0)
MCHC: 27.1 g/dL — ABNORMAL LOW (ref 32.0–36.0)
MCV: 74.5 fL — ABNORMAL LOW (ref 80.0–100.0)
Monocytes Relative: 14.9 %
Neutro Abs: 1509 cells/uL (ref 1500–7800)
Neutrophils Relative %: 43.1 %
Platelets: 172 10*3/uL (ref 140–400)
RBC: 4.11 10*6/uL — ABNORMAL LOW (ref 4.20–5.80)
RDW: 17.3 % — ABNORMAL HIGH (ref 11.0–15.0)
Total Lymphocyte: 34 %
WBC: 3.5 10*3/uL — ABNORMAL LOW (ref 3.8–10.8)

## 2021-11-02 LAB — COMPLETE METABOLIC PANEL WITH GFR
AG Ratio: 0.9 (calc) — ABNORMAL LOW (ref 1.0–2.5)
ALT: 16 U/L (ref 9–46)
AST: 38 U/L (ref 10–40)
Albumin: 3 g/dL — ABNORMAL LOW (ref 3.6–5.1)
Alkaline phosphatase (APISO): 169 U/L — ABNORMAL HIGH (ref 36–130)
BUN: 10 mg/dL (ref 7–25)
CO2: 21 mmol/L (ref 20–32)
Calcium: 8.4 mg/dL — ABNORMAL LOW (ref 8.6–10.3)
Chloride: 110 mmol/L (ref 98–110)
Creat: 0.95 mg/dL (ref 0.60–1.29)
Globulin: 3.5 g/dL (calc) (ref 1.9–3.7)
Glucose, Bld: 88 mg/dL (ref 65–99)
Potassium: 4 mmol/L (ref 3.5–5.3)
Sodium: 141 mmol/L (ref 135–146)
Total Bilirubin: 2.2 mg/dL — ABNORMAL HIGH (ref 0.2–1.2)
Total Protein: 6.5 g/dL (ref 6.1–8.1)
eGFR: 98 mL/min/{1.73_m2} (ref >=60)

## 2021-11-02 LAB — IRON,TIBC AND FERRITIN PANEL
%SAT: 7 % (calc) — ABNORMAL LOW (ref 20–48)
Ferritin: 8 ng/mL — ABNORMAL LOW (ref 38–380)
Iron: 22 ug/dL — ABNORMAL LOW (ref 50–180)
TIBC: 331 mcg/dL (calc) (ref 250–425)

## 2021-11-02 LAB — PROTIME-INR
INR: 1.2 — ABNORMAL HIGH
Prothrombin Time: 12.4 s — ABNORMAL HIGH (ref 9.0–11.5)

## 2021-11-13 ENCOUNTER — Other Ambulatory Visit: Payer: Self-pay | Admitting: *Deleted

## 2021-11-13 DIAGNOSIS — D500 Iron deficiency anemia secondary to blood loss (chronic): Secondary | ICD-10-CM

## 2021-11-13 NOTE — Progress Notes (Signed)
noted 

## 2021-11-14 ENCOUNTER — Encounter (HOSPITAL_COMMUNITY): Payer: Self-pay | Admitting: Adult Health

## 2021-11-15 ENCOUNTER — Inpatient Hospital Stay (HOSPITAL_COMMUNITY): Payer: 59 | Admitting: Hematology

## 2021-11-21 ENCOUNTER — Telehealth: Payer: Self-pay | Admitting: Internal Medicine

## 2021-11-21 NOTE — Telephone Encounter (Signed)
Recall for mri liver 

## 2021-11-29 NOTE — Telephone Encounter (Signed)
Patient called in stating he thought since he was going to [facility] he did not need MRI. Please advise Vicente Males thanks

## 2021-12-01 ENCOUNTER — Inpatient Hospital Stay (HOSPITAL_COMMUNITY): Payer: 59 | Attending: Hematology | Admitting: Hematology

## 2021-12-01 VITALS — BP 126/85 | HR 82 | Temp 98.1°F | Resp 18 | Ht 74.0 in | Wt 339.2 lb

## 2021-12-01 DIAGNOSIS — D509 Iron deficiency anemia, unspecified: Secondary | ICD-10-CM | POA: Diagnosis not present

## 2021-12-01 DIAGNOSIS — Z79899 Other long term (current) drug therapy: Secondary | ICD-10-CM | POA: Diagnosis not present

## 2021-12-01 DIAGNOSIS — Z87891 Personal history of nicotine dependence: Secondary | ICD-10-CM | POA: Insufficient documentation

## 2021-12-01 DIAGNOSIS — Z809 Family history of malignant neoplasm, unspecified: Secondary | ICD-10-CM | POA: Insufficient documentation

## 2021-12-01 DIAGNOSIS — R5383 Other fatigue: Secondary | ICD-10-CM | POA: Diagnosis not present

## 2021-12-01 DIAGNOSIS — Z803 Family history of malignant neoplasm of breast: Secondary | ICD-10-CM | POA: Diagnosis not present

## 2021-12-01 DIAGNOSIS — D500 Iron deficiency anemia secondary to blood loss (chronic): Secondary | ICD-10-CM

## 2021-12-01 NOTE — Patient Instructions (Signed)
Antonio [name] at [facility] Discharge Instructions  You were seen and examined today by Dr. Delton Coombes. Dr. Delton Coombes is a hematologist, meaning that he specializes in blood abnormalities. Dr. Delton Coombes discussed your past medical history, family history of cancers/blood conditions and the events that led to you being here today.  You were referred to Dr. Delton Coombes due to anemia. Your iron is low and you need iron infusions.  Follow-up with Dr. Delton Coombes about a month after you complete iron infusions.   Thank you for choosing [place] at [facility] to provide your oncology and hematology care.  To afford each patient quality time with our provider, please arrive at least 15 minutes before your scheduled appointment time.   If you have a lab appointment with the [place] please come in thru the Main Entrance and check in at the main information desk.  You need to re-schedule your appointment should you arrive 10 or more minutes late.  We strive to give you quality time with our providers, and arriving late affects you and other patients whose appointments are after yours.  Also, if you no show three or more times for appointments you may be dismissed from the clinic at the providers discretion.     Again, thank you for choosing [facility].  Our hope is that these requests will decrease the amount of time that you wait before being seen by our physicians.       _____________________________________________________________  Should you have questions after your visit to [facility], please contact our office at [phone] and follow the prompts.  Our office hours are 8:00 a.m. and 4:30 p.m. Monday - Friday.  Please note that voicemails left after 4:00 p.m. may not be returned until the following business day.  We are closed weekends and major holidays.  You do have access to a nurse 24-7, just call the main number  to the clinic [phone] and do not press any options, hold on the line and a nurse will answer the phone.    For prescription refill requests, have your pharmacy contact our office and allow 72 hours.

## 2021-12-01 NOTE — Progress Notes (Signed)
[facility] [address], [place] [address]   CLINIC:  Medical Oncology/Hematology  Patient Care Team: Kirstie Peri, MD as PCP - General (Internal Medicine) Lanelle Bal, DO as Consulting Physician (Internal Medicine)  CHIEF COMPLAINTS/PURPOSE OF CONSULTATION:  Evaluation for IDA  HISTORY OF PRESENTING ILLNESS:  Antonio [name] 51 y.o. male is here because of evaluation for IDA, at the request of Lewie Loron, NP.   Today he reports feeling well. He denies hematochezia, hematuria, and black stools. His last colonoscopy was on 08/21/2021. He reports ice pica and fatigue. He denies nosebleeds and easy bruising or bleeding. He denies fevers, night sweats, and significant unintentional weight loss. He had a Feraheme infusion in 2019.   He is currently on disability, and previously he worked as a Location manager. He denies smoking history and alcohol consumption. His maternal uncle had cirrhosis. His maternal aunt had breast cancer, and another maternal aunt had cancer of an unknown type.   MEDICAL HISTORY:  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HCC)    History of gout     SURGICAL HISTORY: Past Surgical History:  Procedure Laterality Date   BIOPSY  03/08/2016   Procedure: BIOPSY;  Surgeon: Corbin Ade, MD;  [facility];  Service: Endoscopy;;  descending colon biopsies   BIOPSY  04/14/2019   Procedure: BIOPSY;  Surgeon: West Bali, MD;  [facility];  Service: Endoscopy;;   BIOPSY  08/21/2021   Procedure: BIOPSY;  Surgeon: Lanelle Bal, DO;  [facility];  Service: Endoscopy;;   Colonoscopy     per patient around 2014 in [place]    COLONOSCOPY WITH PROPOFOL [name] 03/08/2016   One 8 mm polyp in the descending colon, removed with a hot snare. Resected and Retrieved.One 4 mm polyp in the cecum, removed with a cold snare. Resected and retrieved. Rectal  varices. Melanosis coli. Abnormal left colon mucosa status post biopsy. Tubular adenomas. Surveillance was due 2022.   COLONOSCOPY WITH PROPOFOL [name] 08/21/2021   Procedure: COLONOSCOPY WITH PROPOFOL;  Surgeon: Lanelle Bal, DO;  [facility];  Service: Endoscopy;  Laterality: [name];  11:15am   ESOPHAGOGASTRODUODENOSCOPY (EGD) WITH PROPOFOL [name] 12/28/2015   Dr. Darrick Penna: 4 columns of esophageal varices, 3 grade 1, 1 grade 2 with no evidence of bleeding. Mild portal gastropathy with no evidence of active bleeding.   ESOPHAGOGASTRODUODENOSCOPY (EGD) WITH PROPOFOL [name] 03/08/2016   Procedure: ESOPHAGOGASTRODUODENOSCOPY (EGD) WITH PROPOFOL;  Surgeon: Corbin Ade, MD;  [facility];  Service: Endoscopy;  Laterality: [name];   ESOPHAGOGASTRODUODENOSCOPY (EGD) WITH PROPOFOL [name] 04/14/2019   Grade 1 and 2 esophageal varices without bleeding and no stigmata of bleeding. moderate portal gastropath, mild erosive gastritis. Path with positive H.pylori   ESOPHAGOGASTRODUODENOSCOPY (EGD) WITH PROPOFOL [name] 08/21/2021   Procedure: ESOPHAGOGASTRODUODENOSCOPY (EGD) WITH PROPOFOL;  Surgeon: Lanelle Bal, DO;  [facility];  Service: Endoscopy;  Laterality: [name];   GIVENS CAPSULE STUDY [name] 07/05/2017   Procedure: GIVENS CAPSULE STUDY;  Surgeon: West Bali, MD;  [facility];  Service: Endoscopy;  Laterality: [name];  7:30am   GIVENS CAPSULE STUDY [name] 08/05/2017   Procedure: GIVENS CAPSULE STUDY;  Surgeon: West Bali, MD;  [facility];  Service: Endoscopy;  Laterality: [name];  7:30am   ORIF ANKLE FRACTURE Right 06/02/2021   Procedure: OPEN REDUCTION  INTERNAL FIXATION (ORIF) ANKLE FRACTURE;  Surgeon: Carole Civil, MD;  Location: [facility];  Service: Orthopedics;  Laterality: Right;   POLYPECTOMY  03/08/2016   Procedure: POLYPECTOMY;  Surgeon: Daneil Dolin, MD;  [facility];  Service: Endoscopy;;  cecum and descending    POLYPECTOMY   08/21/2021   Procedure: POLYPECTOMY INTESTINAL;  Surgeon: [name], DO;  [facility];  Service: Endoscopy;;    SOCIAL HISTORY: Social History   Socioeconomic History   Marital status: Married    Spouse name: Not on file   Number of children: Not on file   Years of education: Not on file   Highest education level: Not on file  Occupational History   Not on file  Tobacco Use   Smoking status: Former    Packs/day: 0.25    Years: 8.00    Total pack years: 2.00    Types: Cigarettes    Quit date: 2016    Years since quitting: 7.5    Passive exposure: Current   Smokeless tobacco: Never  Vaping Use   Vaping Use: Never used  Substance and Sexual Activity   Alcohol use: No    Comment: former--quit after 12/2015 hospitalization   Drug use: No   Sexual activity: Yes  Other Topics Concern   Not on file  Social History Narrative   MARRIED. 2 KIDS-7 GRANDBABIES. SPENDS FREE TIME: WITH GRANDCHILDREN, AND WATCHING TV.   Social Determinants of Health   Financial Resource Strain: Not on file  Food Insecurity: Not on file  Transportation Needs: Not on file  Physical Activity: Not on file  Stress: Not on file  Social Connections: Not on file  Intimate Partner Violence: Not on file    FAMILY HISTORY: Family History  Problem Relation Age of Onset   Colon cancer Neg Hx    Liver disease Neg Hx     ALLERGIES:  has No Known Allergies.  MEDICATIONS:  Current Outpatient Medications  Medication Sig Dispense Refill   lactulose (CHRONULAC) 10 GM/15ML solution TAKE TWO TABLESPOONFUL (30 ML) BY MOUTH THREE TIMES DAILY (Patient taking differently: Take 10 g by mouth as needed for moderate constipation.) 1892 mL 5   pantoprazole (PROTONIX) 40 MG tablet Take 1 tablet (40 mg total) by mouth 2 (two) times daily before a meal. 60 tablet 5   torsemide (DEMADEX) 20 MG tablet TAKE 1 TABLET BY MOUTH DAILY TAKE 1/2 TABLE WHEN PATIENT HAS EXTRA SWELLING (Patient taking differently:  Take 10-20 mg by mouth as needed (fluid).) 135 tablet 1   No current facility-administered medications for this visit.    REVIEW OF SYSTEMS:   Review of Systems  Constitutional:  Positive for fatigue. Negative for appetite change.  HENT:   Negative for nosebleeds.   Respiratory:  Positive for shortness of breath.   Gastrointestinal:  Negative for blood in stool.  Genitourinary:  Negative for hematuria.   Hematological:  Does not bruise/bleed easily.  Psychiatric/Behavioral:  Positive for sleep disturbance.   All other systems reviewed and are negative.    PHYSICAL EXAMINATION: ECOG PERFORMANCE STATUS: 1 - Symptomatic but completely ambulatory  There were no vitals filed for this visit. There were no vitals filed for this visit. Physical Exam Vitals reviewed.  Constitutional:      Appearance: Normal appearance. He is obese.  Cardiovascular:     Rate and Rhythm: Normal rate and regular rhythm.     Pulses: Normal pulses.     Heart sounds: Normal  heart sounds.  Pulmonary:     Effort: Pulmonary effort is normal.     Breath sounds: Normal breath sounds.  Abdominal:     Palpations: Abdomen is soft. There is no hepatomegaly, splenomegaly or mass.     Tenderness: There is no abdominal tenderness.  Musculoskeletal:     Right lower leg: No edema.     Left lower leg: No edema.  Lymphadenopathy:     Lower Body: No right inguinal adenopathy. No left inguinal adenopathy.  Skin:    Comments: [place]   Neurological:     General: No focal deficit present.     Mental Status: He is alert and oriented to person, place, and time.  Psychiatric:        Mood and Affect: Mood normal.        Behavior: Behavior normal.      LABORATORY DATA:  I have reviewed the data as listed Recent Results (from the past 2160 hour(s))  AFP tumor marker     Status: Abnormal   Collection Time: 09/07/21  1:19 PM  Result Value Ref Range   AFP-Tumor Marker 22.9 (H) <6.1 ng/mL    Comment: . This test  was performed using the Beckman Coulter chemiluminescent method. Values obtained from different assay methods cannot be used interchangeably. AFP levels, regardless of value, should not be interpreted as absolute evidence of the presence or absence of disease. .   Iron, TIBC and Ferritin Panel     Status: Abnormal   Collection Time: 11/01/21 11:19 AM  Result Value Ref Range   Iron 22 (L) 50 - 180 mcg/dL   TIBC 331 250 - 425 mcg/dL (calc)   %SAT 7 (L) 20 - 48 % (calc)   Ferritin 8 (L) 38 - 380 ng/mL  CBC w/Diff/Platelet     Status: Abnormal   Collection Time: 11/01/21 11:19 AM  Result Value Ref Range   WBC 3.5 (L) 3.8 - 10.8 Thousand/uL   RBC 4.11 (L) 4.20 - 5.80 Million/uL   Hemoglobin 8.3 (L) 13.2 - 17.1 g/dL   HCT 30.6 (L) 38.5 - 50.0 %   MCV 74.5 (L) 80.0 - 100.0 fL   MCH 20.2 (L) 27.0 - 33.0 pg   MCHC 27.1 (L) 32.0 - 36.0 g/dL   RDW 17.3 (H) 11.0 - 15.0 %   Platelets 172 140 - 400 Thousand/uL   MPV  7.5 - 12.5 fL    Comment: Due to platelet or RBC variability in size or shape the result cannot be reported accurately.    Neutro Abs 1,509 1,500 - 7,800 cells/uL   Lymphs Abs 1,190 850 - 3,900 cells/uL   Absolute Monocytes 522 200 - 950 cells/uL   Eosinophils Absolute 210 15 - 500 cells/uL   Basophils Absolute 70 0 - 200 cells/uL   Neutrophils Relative % 43.1 %   Total Lymphocyte 34.0 %   Monocytes Relative 14.9 %   Eosinophils Relative 6.0 %   Basophils Relative 2.0 %  COMPLETE METABOLIC PANEL WITH GFR     Status: Abnormal   Collection Time: 11/01/21 11:19 AM  Result Value Ref Range   Glucose, Bld 88 65 - 99 mg/dL    Comment: .            Fasting reference interval .    BUN 10 7 - 25 mg/dL   Creat 0.95 0.60 - 1.29 mg/dL   eGFR 98 > OR = 60 mL/min/1.28m2    Comment: The eGFR is based on the  CKD-EPI 2021 equation. To calculate  the new eGFR from a previous Creatinine or Cystatin C result, go to https://www.kidney.org/professionals/ kdoqi/gfr%5Fcalculator     BUN/Creatinine Ratio NOT APPLICABLE 6 - 22 (calc)   Sodium 141 135 - 146 mmol/L   Potassium 4.0 3.5 - 5.3 mmol/L   Chloride 110 98 - 110 mmol/L   CO2 21 20 - 32 mmol/L   Calcium 8.4 (L) 8.6 - 10.3 mg/dL   Total Protein 6.5 6.1 - 8.1 g/dL   Albumin 3.0 (L) 3.6 - 5.1 g/dL   Globulin 3.5 1.9 - 3.7 g/dL (calc)   AG Ratio 0.9 (L) 1.0 - 2.5 (calc)   Total Bilirubin 2.2 (H) 0.2 - 1.2 mg/dL   Alkaline phosphatase (APISO) 169 (H) 36 - 130 U/L   AST 38 10 - 40 U/L   ALT 16 9 - 46 U/L  INR/PT     Status: Abnormal   Collection Time: 11/01/21 11:19 AM  Result Value Ref Range   INR 1.2 (H)     Comment: Reference Range                     0.9-1.1 Moderate-intensity Warfarin Therapy 2.0-3.0 Higher-intensity Warfarin Therapy   3.0-4.0  .    Prothrombin Time 12.4 (H) 9.0 - 11.5 sec    Comment: For additional information, please refer to http://education.questdiagnostics.com/faq/FAQ104 (This link is being provided for informational/ educational purposes only.)     RADIOGRAPHIC STUDIES: I have personally reviewed the radiological images as listed and agreed with the findings in the report. No results found.  ASSESSMENT:  IDA: - Patient seen at the request of Roseanne Kaufman, GI. - Labs from 11/01/2021 showed severely low ferritin and hemoglobin. - History of cirrhosis (EtOH), esophageal varices - Colonoscopy (March 2023): Hemorrhoids, 2 mm polyp benign - EGD (March 2023): Grade 1 esophageal varices, gastritis status postbiopsy, portal gastropathy, duodenitis. - Complains of fatigue and ice pica.  Denies any bleeding per rectum or melena.  No easy bruising or bleeding.   Social/family history: - He has been on disability for 40 years.  He worked as a Glass blower/designer prior to that.  Non-smoker.  Quit drinking alcohol 7 years ago. - Maternal uncle had cirrhosis.  No family history of anemia.  Maternal aunt had breast cancer.  Another maternal aunt had cancer.   PLAN:  Severe iron deficiency  anemia: - I have reviewed labs from 11/01/2021. - Recommend parenteral iron therapy with Venofer x3 for total dose of 1 g. - Discussed side effects including severe allergic reactions. - RTC 6 weeks for follow-up with repeat CBC, ferritin and iron panel.  We will also check for other coexisting nutritional deficiency status.   All questions were answered. The patient knows to call the clinic with any problems, questions or concerns.  Derek Jack, MD 12/01/21 12:14 PM  [facility] [phone]   I, Thana Ates, am acting as a scribe for Dr. Derek Jack.  I, Derek Jack MD, have reviewed the above documentation for accuracy and completeness, and I agree with the above.

## 2021-12-04 NOTE — Telephone Encounter (Signed)
We will wait and see what [facility] says. I suspect they will want to order the MRI through their facility as they are seeing him for possible HCC. IF they do not order it, we will need to.

## 2021-12-04 NOTE — Telephone Encounter (Signed)
Pt aware.

## 2021-12-12 ENCOUNTER — Encounter (HOSPITAL_COMMUNITY): Payer: Self-pay

## 2021-12-12 ENCOUNTER — Inpatient Hospital Stay (HOSPITAL_COMMUNITY): Payer: 59

## 2021-12-12 VITALS — BP 136/86 | HR 66 | Temp 97.2°F | Resp 20

## 2021-12-12 DIAGNOSIS — D500 Iron deficiency anemia secondary to blood loss (chronic): Secondary | ICD-10-CM

## 2021-12-12 DIAGNOSIS — D509 Iron deficiency anemia, unspecified: Secondary | ICD-10-CM | POA: Diagnosis not present

## 2021-12-12 MED ORDER — SODIUM CHLORIDE 0.9 % IV SOLN
300.0000 mg | Freq: Once | INTRAVENOUS | Status: AC
  Administered 2021-12-12: 300 mg via INTRAVENOUS
  Filled 2021-12-12: qty 300

## 2021-12-12 MED ORDER — ACETAMINOPHEN 325 MG PO TABS
650.0000 mg | ORAL_TABLET | Freq: Once | ORAL | Status: AC
  Administered 2021-12-12: 650 mg via ORAL
  Filled 2021-12-12: qty 2

## 2021-12-12 MED ORDER — LORATADINE 10 MG PO TABS
10.0000 mg | ORAL_TABLET | Freq: Once | ORAL | Status: AC
  Administered 2021-12-12: 10 mg via ORAL
  Filled 2021-12-12: qty 1

## 2021-12-12 MED ORDER — SODIUM CHLORIDE 0.9 % IV SOLN: Freq: Once | INTRAVENOUS | Status: AC

## 2021-12-12 NOTE — Patient Instructions (Signed)
[facility]  Discharge Instructions: Thank you for choosing [facility] to provide your oncology and hematology care.  If you have a lab appointment with the [facility], please come in thru the Main Entrance and check in at the main information desk.  Wear comfortable clothing and clothing appropriate for easy access to any Portacath or PICC line.   We strive to give you quality time with your provider. You may need to reschedule your appointment if you arrive late (15 or more minutes).  Arriving late affects you and other patients whose appointments are after yours.  Also, if you miss three or more appointments without notifying the office, you may be dismissed from the clinic at the provider's discretion.      For prescription refill requests, have your pharmacy contact our office and allow 72 hours for refills to be completed.    Today you received the following Venofer, return as scheduled.   To help prevent nausea and vomiting after your treatment, we encourage you to take your nausea medication as directed.  BELOW ARE SYMPTOMS THAT SHOULD BE REPORTED IMMEDIATELY: *FEVER GREATER THAN 100.4 F (38 C) OR HIGHER *CHILLS OR SWEATING *NAUSEA AND VOMITING THAT IS NOT CONTROLLED WITH YOUR NAUSEA MEDICATION *UNUSUAL SHORTNESS OF BREATH *UNUSUAL BRUISING OR BLEEDING *URINARY PROBLEMS (pain or burning when urinating, or frequent urination) *BOWEL PROBLEMS (unusual diarrhea, constipation, pain near the anus) TENDERNESS IN MOUTH AND THROAT WITH OR WITHOUT PRESENCE OF ULCERS (sore throat, sores in mouth, or a toothache) UNUSUAL RASH, SWELLING OR PAIN  UNUSUAL VAGINAL DISCHARGE OR ITCHING   Items with * indicate a potential emergency and should be followed up as soon as possible or go to the Emergency Department if any problems should occur.  Please show the CHEMOTHERAPY ALERT CARD or IMMUNOTHERAPY ALERT CARD at check-in to the Emergency Department and triage  nurse.  Should you have questions after your visit or need to cancel or reschedule your appointment, please contact [facility] [phone]  and follow the prompts.  Office hours are 8:00 a.m. to 4:30 p.m. Monday - Friday. Please note that voicemails left after 4:00 p.m. may not be returned until the following business day.  We are closed weekends and major holidays. You have access to a nurse at all times for urgent questions. Please call the main number to the clinic [phone] and follow the prompts.  For any non-urgent questions, you may also contact your provider using MyChart. We now offer e-Visits for anyone 33 and older to request care online for non-urgent symptoms. For details visit [email or web address].   Also download the MyChart app! Go to the app store, search "MyChart", open the app, select [facility], and log in with your MyChart username and password.  Masks are optional in the cancer centers. If you would like for your care team to wear a mask while they are taking care of you, please let them know. For doctor visits, patients may have with them one support person who is at least 49 years old. At this time, visitors are not allowed in the infusion area.

## 2021-12-12 NOTE — Progress Notes (Signed)
Patient tolerated iron infusion with no complaints voiced.  Peripheral IV site clean and dry with good blood return noted before and after infusion.  Band aid applied.  VSS with discharge and left in satisfactory condition with no s/s of distress noted.   

## 2021-12-22 ENCOUNTER — Inpatient Hospital Stay (HOSPITAL_COMMUNITY): Payer: 59

## 2021-12-22 VITALS — BP 130/83 | HR 67 | Temp 98.0°F | Resp 18

## 2021-12-22 DIAGNOSIS — D500 Iron deficiency anemia secondary to blood loss (chronic): Secondary | ICD-10-CM

## 2021-12-22 DIAGNOSIS — D509 Iron deficiency anemia, unspecified: Secondary | ICD-10-CM | POA: Diagnosis not present

## 2021-12-22 MED ORDER — SODIUM CHLORIDE 0.9 % IV SOLN
300.0000 mg | Freq: Once | INTRAVENOUS | Status: AC
  Administered 2021-12-22: 300 mg via INTRAVENOUS
  Filled 2021-12-22: qty 300

## 2021-12-22 MED ORDER — LORATADINE 10 MG PO TABS
10.0000 mg | ORAL_TABLET | Freq: Once | ORAL | Status: AC
  Administered 2021-12-22: 10 mg via ORAL
  Filled 2021-12-22: qty 1

## 2021-12-22 MED ORDER — SODIUM CHLORIDE 0.9 % IV SOLN: Freq: Once | INTRAVENOUS | Status: AC

## 2021-12-22 MED ORDER — ACETAMINOPHEN 325 MG PO TABS
650.0000 mg | ORAL_TABLET | Freq: Once | ORAL | Status: AC
  Administered 2021-12-22: 650 mg via ORAL
  Filled 2021-12-22: qty 2

## 2021-12-22 NOTE — Patient Instructions (Signed)
[facility]  Discharge Instructions: Thank you for choosing [facility] to provide your oncology and hematology care.  If you have a lab appointment with the Cancer Center, please come in thru the Main Entrance and check in at the main information desk.  Wear comfortable clothing and clothing appropriate for easy access to any Portacath or PICC line.   We strive to give you quality time with your provider. You may need to reschedule your appointment if you arrive late (15 or more minutes).  Arriving late affects you and other patients whose appointments are after yours.  Also, if you miss three or more appointments without notifying the office, you may be dismissed from the clinic at the provider's discretion.      For prescription refill requests, have your pharmacy contact our office and allow 72 hours for refills to be completed.    Today you received Venofer IV iron infusion.     BELOW ARE SYMPTOMS THAT SHOULD BE REPORTED IMMEDIATELY: *FEVER GREATER THAN 100.4 F (38 C) OR HIGHER *CHILLS OR SWEATING *NAUSEA AND VOMITING THAT IS NOT CONTROLLED WITH YOUR NAUSEA MEDICATION *UNUSUAL SHORTNESS OF BREATH *UNUSUAL BRUISING OR BLEEDING *URINARY PROBLEMS (pain or burning when urinating, or frequent urination) *BOWEL PROBLEMS (unusual diarrhea, constipation, pain near the anus) TENDERNESS IN MOUTH AND THROAT WITH OR WITHOUT PRESENCE OF ULCERS (sore throat, sores in mouth, or a toothache) UNUSUAL RASH, SWELLING OR PAIN  UNUSUAL VAGINAL DISCHARGE OR ITCHING   Items with * indicate a potential emergency and should be followed up as soon as possible or go to the Emergency Department if any problems should occur.  Please show the CHEMOTHERAPY ALERT CARD or IMMUNOTHERAPY ALERT CARD at check-in to the Emergency Department and triage nurse.  Should you have questions after your visit or need to cancel or reschedule your appointment, please contact [facility]  [phone]  and follow the prompts.  Office hours are 8:00 a.m. to 4:30 p.m. Monday - Friday. Please note that voicemails left after 4:00 p.m. may not be returned until the following business day.  We are closed weekends and major holidays. You have access to a nurse at all times for urgent questions. Please call the main number to the clinic [phone] and follow the prompts.  For any non-urgent questions, you may also contact your provider using MyChart. We now offer e-Visits for anyone 18 and older to request care online for non-urgent symptoms. For details visit [email or web address].   Also download the MyChart app! Go to the app store, search "MyChart", open the app, select [facility], and log in with your MyChart username and password.  Masks are optional in the cancer centers. If you would like for your care team to wear a mask while they are taking care of you, please let them know. For doctor visits, patients may have with them one support person who is at least 49 years old. At this time, visitors are not allowed in the infusion area.  

## 2021-12-22 NOTE — Progress Notes (Signed)
Pt presents today for Venofer IV per provider's order. Vital signs stable and pt voiced no new complaints at this time.  Peripheral IV started with good blood return pre and post infusion.  Venofer IV iron given today per MD orders. Tolerated infusion without adverse affects. Vital signs stable. No complaints at this time. Discharged from clinic ambulatory in stable condition. Alert and oriented x 3. F/U with [facility] as scheduled.

## 2021-12-29 ENCOUNTER — Inpatient Hospital Stay: Payer: 59 | Attending: Hematology

## 2021-12-29 VITALS — BP 117/73 | HR 73 | Temp 97.0°F | Resp 18

## 2021-12-29 DIAGNOSIS — D509 Iron deficiency anemia, unspecified: Secondary | ICD-10-CM | POA: Diagnosis not present

## 2021-12-29 DIAGNOSIS — D500 Iron deficiency anemia secondary to blood loss (chronic): Secondary | ICD-10-CM

## 2021-12-29 MED ORDER — ACETAMINOPHEN 325 MG PO TABS
650.0000 mg | ORAL_TABLET | Freq: Once | ORAL | Status: AC
  Administered 2021-12-29: 650 mg via ORAL
  Filled 2021-12-29: qty 2

## 2021-12-29 MED ORDER — SODIUM CHLORIDE 0.9 % IV SOLN
400.0000 mg | Freq: Once | INTRAVENOUS | Status: AC
  Administered 2021-12-29: 400 mg via INTRAVENOUS
  Filled 2021-12-29: qty 20

## 2021-12-29 MED ORDER — SODIUM CHLORIDE 0.9 % IV SOLN: Freq: Once | INTRAVENOUS | Status: AC

## 2021-12-29 MED ORDER — LORATADINE 10 MG PO TABS
10.0000 mg | ORAL_TABLET | Freq: Once | ORAL | Status: AC
  Administered 2021-12-29: 10 mg via ORAL
  Filled 2021-12-29: qty 1

## 2021-12-29 NOTE — Patient Instructions (Signed)
[facility]  Discharge Instructions: Thank you for choosing [facility] to provide your oncology and hematology care.  If you have a lab appointment with the [facility], please come in thru the Main Entrance and check in at the main information desk.  Wear comfortable clothing and clothing appropriate for easy access to any Portacath or PICC line.   We strive to give you quality time with your provider. You may need to reschedule your appointment if you arrive late (15 or more minutes).  Arriving late affects you and other patients whose appointments are after yours.  Also, if you miss three or more appointments without notifying the office, you may be dismissed from the clinic at the provider's discretion.      For prescription refill requests, have your pharmacy contact our office and allow 72 hours for refills to be completed.    Today you received Venofer IV iron.     BELOW ARE SYMPTOMS THAT SHOULD BE REPORTED IMMEDIATELY: *FEVER GREATER THAN 100.4 F (38 C) OR HIGHER *CHILLS OR SWEATING *NAUSEA AND VOMITING THAT IS NOT CONTROLLED WITH YOUR NAUSEA MEDICATION *UNUSUAL SHORTNESS OF BREATH *UNUSUAL BRUISING OR BLEEDING *URINARY PROBLEMS (pain or burning when urinating, or frequent urination) *BOWEL PROBLEMS (unusual diarrhea, constipation, pain near the anus) TENDERNESS IN MOUTH AND THROAT WITH OR WITHOUT PRESENCE OF ULCERS (sore throat, sores in mouth, or a toothache) UNUSUAL RASH, SWELLING OR PAIN  UNUSUAL VAGINAL DISCHARGE OR ITCHING   Items with * indicate a potential emergency and should be followed up as soon as possible or go to the Emergency Department if any problems should occur.  Please show the CHEMOTHERAPY ALERT CARD or IMMUNOTHERAPY ALERT CARD at check-in to the Emergency Department and triage nurse.  Should you have questions after your visit or need to cancel or reschedule your appointment, please contact [facility] [phone]  and follow the prompts.  Office hours are 8:00 a.m. to 4:30 p.m. Monday - Friday. Please note that voicemails left after 4:00 p.m. may not be returned until the following business day.  We are closed weekends and major holidays. You have access to a nurse at all times for urgent questions. Please call the main number to the clinic [phone] and follow the prompts.  For any non-urgent questions, you may also contact your provider using MyChart. We now offer e-Visits for anyone 69 and older to request care online for non-urgent symptoms. For details visit [email or web address].   Also download the MyChart app! Go to the app store, search "MyChart", open the app, select , and log in with your MyChart username and password.  Masks are optional in the cancer centers. If you would like for your care team to wear a mask while they are taking care of you, please let them know. For doctor visits, patients may have with them one support person who is at least 49 years old. At this time, visitors are not allowed in the infusion area.

## 2021-12-29 NOTE — Progress Notes (Signed)
Pt presents today for Venofer IV iron per provider's order. Vital signs stable and pt voiced no new complaints at this time.  Peripheral IV started with good blood return pre and post infusion.  Venofer 400 mg  given today per MD orders. Tolerated infusion without adverse affects. Vital signs stable. No complaints at this time. Discharged from clinic ambulatory in stable condition. Alert and oriented x 3. F/U with [facility] as scheduled.

## 2022-01-26 ENCOUNTER — Inpatient Hospital Stay: Payer: 59 | Attending: Hematology

## 2022-01-30 DIAGNOSIS — K769 Liver disease, unspecified: Secondary | ICD-10-CM | POA: Insufficient documentation

## 2022-02-01 ENCOUNTER — Ambulatory Visit: Payer: 59 | Admitting: Gastroenterology

## 2022-02-01 DIAGNOSIS — C220 Liver cell carcinoma: Secondary | ICD-10-CM | POA: Insufficient documentation

## 2022-02-02 ENCOUNTER — Inpatient Hospital Stay: Payer: 59 | Admitting: Physician Assistant

## 2022-02-05 ENCOUNTER — Telehealth: Payer: Self-pay | Admitting: Gastroenterology

## 2022-02-05 NOTE — Telephone Encounter (Signed)
Pt's wife called and said that she wanted to know why the patient was scheduled to see an oncologist here when he was already seeing one at [facility].  She said that he saw Vicente Males at the end of May and she referred him to [facility].  Wife asked that we call the patient back since she is at work today.  [phone]

## 2022-02-05 NOTE — Telephone Encounter (Signed)
Pt was made aware and verbalized understanding. Routing to the front to cancel appt for tomorrow and reschedule for a follow up in 3 mths.

## 2022-02-05 NOTE — Telephone Encounter (Signed)
Pt's wife is wanting to know why the patient is still needing to see [place] if he was referred to [facility]. Please advise.

## 2022-02-05 NOTE — Telephone Encounter (Signed)
He can hold off seeing me tomorrow. We still manage the day to day things. We can see him 3 months.

## 2022-02-06 ENCOUNTER — Ambulatory Visit: Payer: 59 | Admitting: Gastroenterology

## 2022-05-08 ENCOUNTER — Ambulatory Visit: Payer: 59 | Admitting: Gastroenterology

## 2022-06-26 ENCOUNTER — Ambulatory Visit: Payer: 59 | Admitting: Gastroenterology

## 2022-07-10 ENCOUNTER — Encounter (HOSPITAL_COMMUNITY): Payer: Self-pay | Admitting: *Deleted

## 2022-07-10 ENCOUNTER — Emergency Department (HOSPITAL_COMMUNITY)
Admission: EM | Admit: 2022-07-10 | Discharge: 2022-07-10 | Disposition: A | Discharge status: Home / Self Care | Payer: 59 | Attending: Emergency Medicine | Admitting: Emergency Medicine

## 2022-07-10 ENCOUNTER — Emergency Department (HOSPITAL_COMMUNITY): Payer: 59

## 2022-07-10 ENCOUNTER — Other Ambulatory Visit: Payer: Self-pay

## 2022-07-10 DIAGNOSIS — L03311 Cellulitis of abdominal wall: Secondary | ICD-10-CM | POA: Diagnosis not present

## 2022-07-10 DIAGNOSIS — K429 Umbilical hernia without obstruction or gangrene: Secondary | ICD-10-CM | POA: Diagnosis not present

## 2022-07-10 DIAGNOSIS — R109 Unspecified abdominal pain: Secondary | ICD-10-CM | POA: Diagnosis present

## 2022-07-10 DIAGNOSIS — I81 Portal vein thrombosis: Secondary | ICD-10-CM | POA: Insufficient documentation

## 2022-07-10 LAB — CBC WITH DIFFERENTIAL/PLATELET
Abs Immature Granulocytes: 0.01 10*3/uL (ref 0.00–0.07)
Basophils Absolute: 0.1 10*3/uL (ref 0.0–0.1)
Basophils Relative: 1 %
Eosinophils Absolute: 0.3 10*3/uL (ref 0.0–0.5)
Eosinophils Relative: 7 %
HCT: 32.5 % — ABNORMAL LOW (ref 39.0–52.0)
Hemoglobin: 10.3 g/dL — ABNORMAL LOW (ref 13.0–17.0)
Immature Granulocytes: 0 %
Lymphocytes Relative: 19 %
Lymphs Abs: 0.9 10*3/uL (ref 0.7–4.0)
MCH: 27.7 pg (ref 26.0–34.0)
MCHC: 31.7 g/dL (ref 30.0–36.0)
MCV: 87.4 fL (ref 80.0–100.0)
Monocytes Absolute: 0.7 10*3/uL (ref 0.1–1.0)
Monocytes Relative: 15 %
Neutro Abs: 2.9 10*3/uL (ref 1.7–7.7)
Neutrophils Relative %: 58 %
Platelets: 172 10*3/uL (ref 150–400)
RBC: 3.72 MIL/uL — ABNORMAL LOW (ref 4.22–5.81)
RDW: 19.3 % — ABNORMAL HIGH (ref 11.5–15.5)
WBC: 4.9 10*3/uL (ref 4.0–10.5)
nRBC: 0 % (ref 0.0–0.2)

## 2022-07-10 LAB — URINALYSIS, ROUTINE W REFLEX MICROSCOPIC
Bacteria, UA: NONE SEEN
Bilirubin Urine: NEGATIVE
Glucose, UA: NEGATIVE mg/dL
Ketones, ur: NEGATIVE mg/dL
Leukocytes,Ua: NEGATIVE
Nitrite: NEGATIVE
Protein, ur: NEGATIVE mg/dL
Specific Gravity, Urine: 1.01 (ref 1.005–1.030)
pH: 6 (ref 5.0–8.0)

## 2022-07-10 LAB — COMPREHENSIVE METABOLIC PANEL
ALT: 31 U/L (ref 0–44)
AST: 77 U/L — ABNORMAL HIGH (ref 15–41)
Albumin: 2.1 g/dL — ABNORMAL LOW (ref 3.5–5.0)
Alkaline Phosphatase: 226 U/L — ABNORMAL HIGH (ref 38–126)
Anion gap: 9 (ref 5–15)
BUN: 19 mg/dL (ref 6–20)
CO2: 28 mmol/L (ref 22–32)
Calcium: 7.8 mg/dL — ABNORMAL LOW (ref 8.9–10.3)
Chloride: 99 mmol/L (ref 98–111)
Creatinine, Ser: 1 mg/dL (ref 0.61–1.24)
GFR, Estimated: >60 mL/min (ref >60)
Glucose, Bld: 99 mg/dL (ref 70–99)
Potassium: 2.7 mmol/L — CL (ref 3.5–5.1)
Sodium: 136 mmol/L (ref 135–145)
Total Bilirubin: 3.4 mg/dL — ABNORMAL HIGH (ref 0.3–1.2)
Total Protein: 6.9 g/dL (ref 6.5–8.1)

## 2022-07-10 LAB — LIPASE, BLOOD: Lipase: 86 U/L — ABNORMAL HIGH (ref 11–51)

## 2022-07-10 LAB — LACTIC ACID, PLASMA
Lactic Acid, Venous: 1.7 mmol/L (ref 0.5–1.9)
Lactic Acid, Venous: 1.9 mmol/L (ref 0.5–1.9)

## 2022-07-10 MED ORDER — ONDANSETRON HCL 4 MG/2ML IJ SOLN: 4.0000 mg | Freq: Once | INTRAMUSCULAR | Status: DC

## 2022-07-10 MED ORDER — MORPHINE SULFATE (PF) 4 MG/ML IV SOLN: 4.0000 mg | Freq: Once | INTRAVENOUS | Status: DC

## 2022-07-10 MED ORDER — POTASSIUM CHLORIDE CRYS ER 20 MEQ PO TBCR: 20.0000 meq | EXTENDED_RELEASE_TABLET | Freq: Every day | ORAL | 0 refills | Status: DC

## 2022-07-10 MED ORDER — IOHEXOL 300 MG/ML  SOLN
100.0000 mL | Freq: Once | INTRAMUSCULAR | Status: AC | PRN
  Administered 2022-07-10: 100 mL via INTRAVENOUS

## 2022-07-10 MED ORDER — CLINDAMYCIN HCL 300 MG PO CAPS: 300.0000 mg | ORAL_CAPSULE | Freq: Three times a day (TID) | ORAL | 0 refills | Status: AC

## 2022-07-10 MED ORDER — POTASSIUM CHLORIDE 10 MEQ/100ML IV SOLN
10.0000 meq | INTRAVENOUS | Status: AC
  Administered 2022-07-10 (×3): 10 meq via INTRAVENOUS
  Filled 2022-07-10 (×2): qty 100

## 2022-07-10 MED ORDER — POTASSIUM CHLORIDE CRYS ER 20 MEQ PO TBCR
60.0000 meq | EXTENDED_RELEASE_TABLET | Freq: Once | ORAL | Status: AC
  Administered 2022-07-10: 60 meq via ORAL
  Filled 2022-07-10: qty 3

## 2022-07-10 MED ORDER — DOXYCYCLINE HYCLATE 100 MG PO CAPS: 100.0000 mg | ORAL_CAPSULE | Freq: Two times a day (BID) | ORAL | 0 refills | Status: DC

## 2022-07-10 NOTE — ED Triage Notes (Signed)
Pt c/o umbilical pain; pt states he has a hernia and it has been hurting x 2 days

## 2022-07-10 NOTE — ED Provider Notes (Incomplete)
Antonio [name] Provider Note   CSN: [id number] Arrival date & time: 07/10/22  [id number]     History {Add pertinent medical, surgical, social history, OB history to HPI:1} Chief Complaint  Patient presents with   Abdominal Pain    Antonio [name] is a 52 y.o. male.  52 year old male with a history of alcoholic cirrhosis and hepatocellular carcinoma who presents emergency department with periumbilical pain.  States that last night started noticing pain and swelling around his umbilical hernia site.  Says that he noticed some drainage and redness from it as well.  Denies any fevers.  No nausea or vomiting.  Last bowel movement was yesterday and is still passing gas.  Declines pain and nausea medication at this time.       Home Medications Prior to Admission medications   Medication Sig Start Date End Date Taking? Authorizing Provider  IBU 800 MG tablet Take 800 mg by mouth every 8 (eight) hours as needed. 12/01/21   [provider]  lactulose (CHRONULAC) 10 GM/15ML solution TAKE TWO TABLESPOONFUL (30 ML) BY MOUTH THREE TIMES DAILY Patient taking differently: Take 10 g by mouth as needed for moderate constipation. 03/01/21   Mahala Menghini, PA-C  pantoprazole (PROTONIX) 40 MG tablet Take 1 tablet (40 mg total) by mouth 2 (two) times daily before a meal. 08/21/21 02/17/22  Carver, Elon Alas, DO  torsemide (DEMADEX) 20 MG tablet TAKE 1 TABLET BY MOUTH DAILY TAKE 1/2 TABLE WHEN PATIENT HAS EXTRA SWELLING Patient taking differently: Take 10-20 mg by mouth as needed (fluid). 05/12/19   Annitta Needs, NP      Allergies    Patient has no known allergies.    Review of Systems   Review of Systems  Physical Exam Updated Vital Signs BP (!) 135/97 (BP Location: Left Arm)   Pulse 87   Temp 97.9 F (36.6 C) (Oral)   Resp 16   Ht 6' 2"$  (1.88 m)   Wt (!) 142 kg   SpO2 100%   BMI 40.19 kg/m  Physical Exam Vitals and nursing note reviewed.   Constitutional:      General: He is not in acute distress.    Appearance: He is well-developed.  HENT:     Head: Normocephalic and atraumatic.     Right Ear: External ear normal.     Left Ear: External ear normal.     Nose: Nose normal.  Eyes:     Extraocular Movements: Extraocular movements intact.     Conjunctiva/sclera: Conjunctivae normal.     Pupils: Pupils are equal, round, and reactive to light.  Cardiovascular:     Rate and Rhythm: Normal rate and regular rhythm.  Pulmonary:     Effort: Pulmonary effort is normal. No respiratory distress.  Abdominal:     General: There is no distension.     Palpations: Abdomen is soft. There is no mass.     Tenderness: There is abdominal tenderness (Around umbilical hernia site). There is no guarding.  Musculoskeletal:     Cervical back: Normal range of motion and neck supple.  Skin:    General: Skin is warm and dry.  Neurological:     Mental Status: He is alert. Mental status is at baseline.  Psychiatric:        Mood and Affect: Mood normal.        Behavior: Behavior normal.     ED Results / Procedures / Treatments   Labs (all labs  ordered are listed, but only abnormal results are displayed) Labs Reviewed  COMPREHENSIVE METABOLIC PANEL  LIPASE, BLOOD  CBC WITH DIFFERENTIAL/PLATELET  LACTIC ACID, PLASMA  LACTIC ACID, PLASMA  URINALYSIS, ROUTINE W REFLEX MICROSCOPIC  I-STAT CHEM 8, ED    EKG None  Radiology No results found.  Procedures Procedures  {Document cardiac monitor, telemetry assessment procedure when appropriate:1}  Medications Ordered in ED Medications - No data to display  ED Course/ Medical Decision Making/ A&P   {   Click here for ABCD2, HEART and other calculatorsREFRESH Note before signing :1}                          Medical Decision Making Amount and/or Complexity of Data Reviewed Labs: ordered. Radiology: ordered.   ***  {Document critical care time when appropriate:1} {Document  review of labs and clinical decision tools ie heart score, Chads2Vasc2 etc:1}  {Document your independent review of radiology images, and any outside records:1} {Document your discussion with family members, caretakers, and with consultants:1} {Document social determinants of health affecting pt's care:1} {Document your decision making why or why not admission, treatments were needed:1} Final Clinical Impression(s) / ED Diagnoses Final diagnoses:  None    Rx / DC Orders ED Discharge Orders     None

## 2022-07-10 NOTE — Discharge Instructions (Signed)
You were seen for your abdominal pain in the emergency department.   At home, please take the antibiotics we have prescribed you to treat the skin infection that you have.  Please take the potassium pills that we have given you for your low potassium levels.  Follow-up with your primary doctor in 2-3 days regarding your visit.  Please talk to them about your potassium level which was low.  Talk to your surgeons about your hernia to see if this needs to be repaired.  Please also talk to your GI doctor soon as possible regarding your portal vein thrombus to see if you need to be started on blood thinners.   Return immediately to the emergency department if you experience any of the following: Spreading redness, severe abdominal pain, vomiting, fevers, or any other concerning symptoms.    Thank you for visiting our Emergency Department. It was a pleasure taking care of you today.

## 2022-07-10 NOTE — ED Notes (Signed)
Nurse notified MD about critical value of Potassium 2.7 from [facility]

## 2022-07-13 LAB — I-STAT CHEM 8, ED
BUN: 18 mg/dL (ref 6–20)
Calcium, Ion: 1.07 mmol/L — ABNORMAL LOW (ref 1.15–1.40)
Chloride: 96 mmol/L — ABNORMAL LOW (ref 98–111)
Creatinine, Ser: 1 mg/dL (ref 0.61–1.24)
Glucose, Bld: 96 mg/dL (ref 70–99)
HCT: 37 % — ABNORMAL LOW (ref 39.0–52.0)
Hemoglobin: 12.6 g/dL — ABNORMAL LOW (ref 13.0–17.0)
Potassium: 2.7 mmol/L — CL (ref 3.5–5.1)
Sodium: 140 mmol/L (ref 135–145)
TCO2: 28 mmol/L (ref 22–32)

## 2022-07-26 ENCOUNTER — Encounter: Payer: Self-pay | Admitting: Radiology

## 2022-09-14 ENCOUNTER — Encounter (HOSPITAL_COMMUNITY): Payer: Self-pay | Admitting: Hematology

## 2022-09-17 ENCOUNTER — Encounter (HOSPITAL_COMMUNITY): Payer: Self-pay | Admitting: Hematology

## 2022-09-21 ENCOUNTER — Ambulatory Visit: Payer: Self-pay | Admitting: Orthopedic Surgery

## 2023-03-16 ENCOUNTER — Emergency Department (HOSPITAL_COMMUNITY): Payer: BLUE CROSS/BLUE SHIELD

## 2023-03-16 ENCOUNTER — Observation Stay (HOSPITAL_COMMUNITY)
Admission: EM | Admit: 2023-03-16 | Discharge: 2023-03-17 | Disposition: A | Discharge status: Home / Self Care | Payer: BLUE CROSS/BLUE SHIELD | Attending: Family Medicine | Admitting: Family Medicine

## 2023-03-16 ENCOUNTER — Other Ambulatory Visit: Payer: Self-pay

## 2023-03-16 ENCOUNTER — Encounter (HOSPITAL_COMMUNITY): Payer: Self-pay

## 2023-03-16 ENCOUNTER — Encounter (HOSPITAL_COMMUNITY): Payer: Self-pay | Admitting: Hematology

## 2023-03-16 DIAGNOSIS — I509 Heart failure, unspecified: Secondary | ICD-10-CM | POA: Insufficient documentation

## 2023-03-16 DIAGNOSIS — K922 Gastrointestinal hemorrhage, unspecified: Secondary | ICD-10-CM | POA: Diagnosis not present

## 2023-03-16 DIAGNOSIS — Z860100 Personal history of colon polyps, unspecified: Secondary | ICD-10-CM

## 2023-03-16 DIAGNOSIS — D500 Iron deficiency anemia secondary to blood loss (chronic): Secondary | ICD-10-CM | POA: Diagnosis not present

## 2023-03-16 DIAGNOSIS — E876 Hypokalemia: Secondary | ICD-10-CM | POA: Insufficient documentation

## 2023-03-16 DIAGNOSIS — Z87891 Personal history of nicotine dependence: Secondary | ICD-10-CM | POA: Diagnosis not present

## 2023-03-16 DIAGNOSIS — K219 Gastro-esophageal reflux disease without esophagitis: Secondary | ICD-10-CM | POA: Diagnosis not present

## 2023-03-16 DIAGNOSIS — C220 Liver cell carcinoma: Secondary | ICD-10-CM | POA: Diagnosis not present

## 2023-03-16 DIAGNOSIS — K625 Hemorrhage of anus and rectum: Secondary | ICD-10-CM | POA: Diagnosis present

## 2023-03-16 LAB — COMPREHENSIVE METABOLIC PANEL
ALT: 38 U/L (ref 0–44)
AST: 106 U/L — ABNORMAL HIGH (ref 15–41)
Albumin: 2.2 g/dL — ABNORMAL LOW (ref 3.5–5.0)
Alkaline Phosphatase: 175 U/L — ABNORMAL HIGH (ref 38–126)
Anion gap: 5 (ref 5–15)
BUN: 13 mg/dL (ref 6–20)
CO2: 25 mmol/L (ref 22–32)
Calcium: 7.8 mg/dL — ABNORMAL LOW (ref 8.9–10.3)
Chloride: 105 mmol/L (ref 98–111)
Creatinine, Ser: 0.83 mg/dL (ref 0.61–1.24)
GFR, Estimated: >60 mL/min (ref >60)
Glucose, Bld: 96 mg/dL (ref 70–99)
Potassium: 3.8 mmol/L (ref 3.5–5.1)
Sodium: 135 mmol/L (ref 135–145)
Total Bilirubin: 3.4 mg/dL — ABNORMAL HIGH (ref 0.3–1.2)
Total Protein: 6.1 g/dL — ABNORMAL LOW (ref 6.5–8.1)

## 2023-03-16 LAB — CBC WITH DIFFERENTIAL/PLATELET
Abs Immature Granulocytes: 0.01 10*3/uL (ref 0.00–0.07)
Basophils Absolute: 0 10*3/uL (ref 0.0–0.1)
Basophils Relative: 1 %
Eosinophils Absolute: 0.2 10*3/uL (ref 0.0–0.5)
Eosinophils Relative: 4 %
HCT: 35.6 % — ABNORMAL LOW (ref 39.0–52.0)
Hemoglobin: 11.4 g/dL — ABNORMAL LOW (ref 13.0–17.0)
Immature Granulocytes: 0 %
Lymphocytes Relative: 21 %
Lymphs Abs: 0.9 10*3/uL (ref 0.7–4.0)
MCH: 28.8 pg (ref 26.0–34.0)
MCHC: 32 g/dL (ref 30.0–36.0)
MCV: 89.9 fL (ref 80.0–100.0)
Monocytes Absolute: 0.7 10*3/uL (ref 0.1–1.0)
Monocytes Relative: 15 %
Neutro Abs: 2.5 10*3/uL (ref 1.7–7.7)
Neutrophils Relative %: 59 %
Platelets: 140 10*3/uL — ABNORMAL LOW (ref 150–400)
RBC: 3.96 MIL/uL — ABNORMAL LOW (ref 4.22–5.81)
RDW: 19.7 % — ABNORMAL HIGH (ref 11.5–15.5)
WBC: 4.3 10*3/uL (ref 4.0–10.5)
nRBC: 0 % (ref 0.0–0.2)

## 2023-03-16 LAB — URINALYSIS, ROUTINE W REFLEX MICROSCOPIC
Bacteria, UA: NONE SEEN
Glucose, UA: NEGATIVE mg/dL
Ketones, ur: NEGATIVE mg/dL
Leukocytes,Ua: NEGATIVE
Nitrite: NEGATIVE
Protein, ur: 30 mg/dL — AB
Specific Gravity, Urine: >1.046 — ABNORMAL HIGH (ref 1.005–1.030)
pH: 6 (ref 5.0–8.0)

## 2023-03-16 LAB — PROTIME-INR
INR: 1.3 — ABNORMAL HIGH (ref 0.8–1.2)
Prothrombin Time: 16.2 s — ABNORMAL HIGH (ref 11.4–15.2)

## 2023-03-16 LAB — TYPE AND SCREEN
ABO/RH(D): O NEG
Antibody Screen: NEGATIVE

## 2023-03-16 LAB — HEMOGLOBIN AND HEMATOCRIT, BLOOD
HCT: 33 % — ABNORMAL LOW (ref 39.0–52.0)
Hemoglobin: 10.7 g/dL — ABNORMAL LOW (ref 13.0–17.0)

## 2023-03-16 LAB — LIPASE, BLOOD: Lipase: 38 U/L (ref 11–51)

## 2023-03-16 LAB — HIV ANTIBODY (ROUTINE TESTING W REFLEX): HIV Screen 4th Generation wRfx: NONREACTIVE

## 2023-03-16 MED ORDER — PANTOPRAZOLE SODIUM 40 MG IV SOLR
40.0000 mg | Freq: Once | INTRAVENOUS | Status: AC
  Administered 2023-03-16: 40 mg via INTRAVENOUS
  Filled 2023-03-16: qty 10

## 2023-03-16 MED ORDER — ACETAMINOPHEN 650 MG RE SUPP: 650.0000 mg | Freq: Four times a day (QID) | RECTAL | Status: DC | PRN

## 2023-03-16 MED ORDER — BISACODYL 10 MG RE SUPP: 10.0000 mg | Freq: Every day | RECTAL | Status: DC | PRN

## 2023-03-16 MED ORDER — IOHEXOL 300 MG/ML  SOLN
100.0000 mL | Freq: Once | INTRAMUSCULAR | Status: AC | PRN
  Administered 2023-03-16: 100 mL via INTRAVENOUS

## 2023-03-16 MED ORDER — ONDANSETRON HCL 4 MG/2ML IJ SOLN: 4.0000 mg | Freq: Four times a day (QID) | INTRAMUSCULAR | Status: DC | PRN

## 2023-03-16 MED ORDER — ACETAMINOPHEN 325 MG PO TABS: 650.0000 mg | ORAL_TABLET | Freq: Four times a day (QID) | ORAL | Status: DC | PRN

## 2023-03-16 MED ORDER — SODIUM CHLORIDE 0.9 % IV SOLN: INTRAVENOUS | Status: DC | PRN

## 2023-03-16 MED ORDER — SODIUM CHLORIDE 0.9 % IV BOLUS
500.0000 mL | Freq: Once | INTRAVENOUS | Status: AC
  Administered 2023-03-16: 500 mL via INTRAVENOUS

## 2023-03-16 MED ORDER — POLYETHYLENE GLYCOL 3350 17 G PO PACK: 17.0000 g | PACK | Freq: Every day | ORAL | Status: DC | PRN

## 2023-03-16 MED ORDER — ALUM & MAG HYDROXIDE-SIMETH 200-200-20 MG/5ML PO SUSP
15.0000 mL | Freq: Once | ORAL | Status: AC
  Administered 2023-03-16: 15 mL via ORAL
  Filled 2023-03-16: qty 30

## 2023-03-16 MED ORDER — SODIUM CHLORIDE 0.9% FLUSH
3.0000 mL | Freq: Two times a day (BID) | INTRAVENOUS | Status: DC
  Administered 2023-03-16: 3 mL via INTRAVENOUS

## 2023-03-16 MED ORDER — ONDANSETRON HCL 4 MG PO TABS: 4.0000 mg | ORAL_TABLET | Freq: Four times a day (QID) | ORAL | Status: DC | PRN

## 2023-03-16 MED ORDER — ONDANSETRON HCL 4 MG/2ML IJ SOLN
4.0000 mg | Freq: Once | INTRAMUSCULAR | Status: AC
  Administered 2023-03-16: 4 mg via INTRAVENOUS
  Filled 2023-03-16: qty 2

## 2023-03-16 MED ORDER — DEXTROSE-SODIUM CHLORIDE 5-0.9 % IV SOLN: INTRAVENOUS | Status: DC

## 2023-03-16 MED ORDER — SODIUM CHLORIDE 0.9% FLUSH: 3.0000 mL | INTRAVENOUS | Status: DC | PRN

## 2023-03-16 MED ORDER — PANTOPRAZOLE SODIUM 40 MG IV SOLR
40.0000 mg | Freq: Two times a day (BID) | INTRAVENOUS | Status: DC
  Administered 2023-03-16 – 2023-03-17 (×2): 40 mg via INTRAVENOUS
  Filled 2023-03-16 (×3): qty 10

## 2023-03-16 MED ORDER — MORPHINE SULFATE (PF) 4 MG/ML IV SOLN
4.0000 mg | Freq: Once | INTRAVENOUS | Status: AC
  Administered 2023-03-16: 4 mg via INTRAVENOUS
  Filled 2023-03-16: qty 1

## 2023-03-16 NOTE — ED Notes (Signed)
POC occult positive.

## 2023-03-16 NOTE — ED Notes (Signed)
Pt using urinal at bedside.

## 2023-03-16 NOTE — H&P (Signed)
Patient Demographics:    Antonio [name], is a 52 y.o. male  MRN: [id number]   DOB - 10-Mar-1971  Admit Date - 03/16/2023  Outpatient Primary MD for the patient is Kirstie Peri, MD   Assessment & Plan:   Assessment and Plan: 1)Acute GI/Rectal Bleed-----rectal bleeding since 03/14/2023--patient had both bright red blood per rectum without clots, now having dark stools -No nausea no vomiting- -no significant abdominal discomfort No fever  Or chills  --Stool occult blood is positive -Hgb is 11.4 which is similar to prior baseline -Colonoscopy 08/21/2021 with hemorrhoids, one 2 mm polyp s/p removal ( benign aggregates).   EGD 08/21/2021: Grade 1 esophageal varices, gastritis s/p biopsy, portal gastropathy, duodenitis  -Discussed with on-call GI physician Dr. Levon Hedger... Official GI consult pending Check serial Hgb  -platelets 140  -Transfuse as indicated -N.p.o. after midnight  2)History of alcoholic liver cirrhosis with esophageal varices and hemorrhoids - INR is 1.3 -AST 106 ALT 38, alk phos 175 with a T. bili of 3.4 all of which is similar to priors -GI input requested -Patient is not encephalopathic  3) iron deficiency anemia--prior workup consistent with iron deficiency anemia -Now with GI bleed as above #1 -Serial H&H -Official GI consult requested 4)GERD--- continue Protonix  5)Hepatocellular Carcinoma--defer management to GI team and oncology -Patient has seen Dr. Ellin Saba previously  6)Morbid Obesity- -Low calorie diet, portion control and increase physical activity discussed with patient -Body mass index is 38.64 kg/m.   Dispo: The patient is from: Home              Anticipated d/c is to: Home              Anticipated d/c date is: 1 day              Patient currently is not medically stable to  d/c. Barriers: Not Clinically Stable-   With History of - Reviewed by me  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HCC)    History of gout       Past Surgical History:  Procedure Laterality Date   BIOPSY  03/08/2016   Procedure: BIOPSY;  Surgeon: Corbin Ade, MD;  [facility];  Service: Endoscopy;;  descending colon biopsies   BIOPSY  04/14/2019   Procedure: BIOPSY;  Surgeon: West Bali, MD;  [facility]: [facility];  Service: Endoscopy;;   BIOPSY  08/21/2021   Procedure: BIOPSY;  Surgeon: Lanelle Bal, DO;  [facility]: [facility];  Service: Endoscopy;;   Colonoscopy     per patient around 2014 in [place]    COLONOSCOPY WITH PROPOFOL [name] 03/08/2016   One 8 mm polyp in the descending colon, removed with a hot snare. Resected and Retrieved.One 4 mm polyp in the cecum, removed with a cold snare. Resected and retrieved. Rectal varices. Melanosis coli. Abnormal left colon mucosa status  post biopsy. Tubular adenomas. Surveillance was due 2022.   COLONOSCOPY WITH PROPOFOL [name] 08/21/2021   Procedure: COLONOSCOPY WITH PROPOFOL;  Surgeon: Lanelle Bal, DO;  [facility]: [facility];  Service: Endoscopy;  Laterality: [name];  11:15am   ESOPHAGOGASTRODUODENOSCOPY (EGD) WITH PROPOFOL [name] 12/28/2015   Dr. Darrick Penna: 4 columns of esophageal varices, 3 grade 1, 1 grade 2 with no evidence of bleeding. Mild portal gastropathy with no evidence of active bleeding.   ESOPHAGOGASTRODUODENOSCOPY (EGD) WITH PROPOFOL [name] 03/08/2016   Procedure: ESOPHAGOGASTRODUODENOSCOPY (EGD) WITH PROPOFOL;  Surgeon: Corbin Ade, MD;  [facility];  Service: Endoscopy;  Laterality: [name];   ESOPHAGOGASTRODUODENOSCOPY (EGD) WITH PROPOFOL [name] 04/14/2019   Grade 1 and 2 esophageal varices without bleeding and no stigmata of bleeding. moderate portal gastropath, mild erosive gastritis. Path with positive H.pylori    ESOPHAGOGASTRODUODENOSCOPY (EGD) WITH PROPOFOL [name] 08/21/2021   Procedure: ESOPHAGOGASTRODUODENOSCOPY (EGD) WITH PROPOFOL;  Surgeon: Lanelle Bal, DO;  [facility];  Service: Endoscopy;  Laterality: [name];   GIVENS CAPSULE STUDY [name] 07/05/2017   Procedure: GIVENS CAPSULE STUDY;  Surgeon: West Bali, MD;  [facility];  Service: Endoscopy;  Laterality: [name];  7:30am   GIVENS CAPSULE STUDY [name] 08/05/2017   Procedure: GIVENS CAPSULE STUDY;  Surgeon: West Bali, MD;  [facility];  Service: Endoscopy;  Laterality: [name];  7:30am   ORIF ANKLE FRACTURE Right 06/02/2021   Procedure: OPEN REDUCTION INTERNAL FIXATION (ORIF) ANKLE FRACTURE;  Surgeon: Vickki Hearing, MD;  Location: [facility];  Service: Orthopedics;  Laterality: Right;   POLYPECTOMY  03/08/2016   Procedure: POLYPECTOMY;  Surgeon: Corbin Ade, MD;  [facility];  Service: Endoscopy;;  cecum and descending    POLYPECTOMY  08/21/2021   Procedure: POLYPECTOMY INTESTINAL;  Surgeon: Lanelle Bal, DO;  [facility];  Service: Endoscopy;;    Chief Complaint  Patient presents with   Blood In Stools      HPI:    Antonio [name]  is a 52 y.o. 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 pylori gastritis eradicated by documentation in 2023, hepatocellular carcinoma and history of gout with prior endoluminal evaluation showing-:: --Colonoscopy 08/21/2021 with hemorrhoids, one 2 mm polyp s/p removal ( benign aggregates).   EGD 08/21/2021: Grade 1 esophageal varices, gastritis s/p biopsy, portal gastropathy, duodenitis   Who Now presents to the ED with complaints of rectal bleeding since 03/14/2023--patient had both bright red blood per rectum without clots, now having dark stools -No nausea no  vomiting- -no significant abdominal discomfort No fever  Or chills  -In the ED INR is 1.3 -Stool occult blood is positive -Hgb is 11.4 which is similar to prior baseline -WBC 4.3, platelets 140  -Lipase WNL =-Creatinine 0.83, AST 106 ALT 38, alk phos 175 with a T. bili of 3.4 all of which is similar to priors -No chest pains and palpitations no dyspnea on exertion, no significant dizziness    Review of systems:    In addition to the HPI above,   A full Review of  Systems was done, all other systems reviewed are negative except as noted above in HPI , .    Social History:  Reviewed by me    Social History   Tobacco Use   Smoking status: Former  Current packs/day: 0.00    Average packs/day: 0.3 packs/day for 8.0 years (2.0 ttl pk-yrs)    Types: Cigarettes    Start date: 2008    Quit date: 2016    Years since quitting: 8.8    Passive exposure: Current   Smokeless tobacco: Never  Substance Use Topics   Alcohol use: No    Comment: former--quit after 12/2015 hospitalization       Family History :  Reviewed by me    Family History  Problem Relation Age of Onset   Colon cancer Neg Hx    Liver disease Neg Hx     Home Medications:   Prior to Admission medications   Medication Sig Start Date End Date Taking? Authorizing Provider  IBU 800 MG tablet Take 800 mg by mouth every 8 (eight) hours as needed. 12/01/21   [provider]  lactulose (CHRONULAC) 10 GM/15ML solution TAKE TWO TABLESPOONFUL (30 ML) BY MOUTH THREE TIMES DAILY Patient taking differently: Take 10 g by mouth as needed for moderate constipation. 03/01/21   Tiffany Kocher, PA-C  pantoprazole (PROTONIX) 40 MG tablet Take 1 tablet (40 mg total) by mouth 2 (two) times daily before a meal. 08/21/21 02/17/22  Lanelle Bal, DO  potassium chloride SA (KLOR-CON M) 20 MEQ tablet Take 1 tablet (20 mEq total) by mouth daily for 4 days. 07/10/22 07/14/22  Rondel Baton, MD  torsemide (DEMADEX) 20 MG  tablet TAKE 1 TABLET BY MOUTH DAILY TAKE 1/2 TABLE WHEN PATIENT HAS EXTRA SWELLING Patient taking differently: Take 10-20 mg by mouth as needed (fluid). 05/12/19   Gelene Mink, NP     Allergies:    No Known Allergies   Physical Exam:   Vitals  Blood pressure 122/80, pulse 81, temperature (!) 97.5 F (36.4 C), resp. rate 17, height 6\' 2"  (1.88 m), weight (!) 136.5 kg, SpO2 100%.  Physical Examination: General appearance - alert,  in no distress Mental status - alert, oriented to person, place, and time,  Eyes - sclera anicteric Neck - supple, no JVD elevation , Chest - clear  to auscultation bilaterally, symmetrical air movement,  Heart - S1 and S2 normal, regular  Abdomen - soft, nontender, nondistended, +BS, increased truncal adiposity Neurological - screening mental status exam normal, neck supple without rigidity, cranial nerves II through XII intact, DTR's normal and symmetric Extremities - no pedal edema noted, intact peripheral pulses  Skin - warm, dry    Data Review:    CBC Recent Labs  Lab 03/16/23 0755  WBC 4.3  HGB 11.4*  HCT 35.6*  PLT 140*  MCV 89.9  MCH 28.8  MCHC 32.0  RDW 19.7*  LYMPHSABS 0.9  MONOABS 0.7  EOSABS 0.2  BASOSABS 0.0   ------------------------------------------------------------------------------------------------------------------  Chemistries  Recent Labs  Lab 03/16/23 0755  NA 135  K 3.8  CL 105  CO2 25  GLUCOSE 96  BUN 13  CREATININE 0.83  CALCIUM 7.8*  AST 106*  ALT 38  ALKPHOS 175*  BILITOT 3.4*   ------------------------------------------------------------------------------------------------------------------ estimated creatinine clearance is 156.5 mL/min (by C-G formula based on SCr of 0.83 mg/dL). ------------------------------------------------------------------------------------------------------------------  Coagulation profile Recent Labs  Lab 03/16/23 0837  INR 1.3*    -----------------------------------------------------------------------------------------------------------------    Component Value Date/Time   BNP 176.0 (H) 03/25/2016 0343   Urinalysis    Component Value Date/Time   COLORURINE AMBER (A) 03/16/2023 1021   APPEARANCEUR CLEAR 03/16/2023 1021   LABSPEC >1.046 (H) 03/16/2023 1021   PHURINE  6.0 03/16/2023 1021   GLUCOSEU NEGATIVE 03/16/2023 1021   HGBUR SMALL (A) 03/16/2023 1021   BILIRUBINUR SMALL (A) 03/16/2023 1021   KETONESUR NEGATIVE 03/16/2023 1021   PROTEINUR 30 (A) 03/16/2023 1021   NITRITE NEGATIVE 03/16/2023 1021   LEUKOCYTESUR NEGATIVE 03/16/2023 1021     Imaging Results:    CT ABDOMEN PELVIS W CONTRAST  Result Date: 03/16/2023 CLINICAL DATA:  Left lower quadrant abdominal pain and rectal bleeding. EXAM: CT ABDOMEN AND PELVIS WITH CONTRAST TECHNIQUE: Multidetector CT imaging of the abdomen and pelvis was performed using the standard protocol following bolus administration of intravenous contrast. RADIATION DOSE REDUCTION: This exam was performed according to the departmental dose-optimization program which includes automated exposure control, adjustment of the mA and/or kV according to patient size and/or use of iterative reconstruction technique. CONTRAST:  OMNIPAQUE IOHEXOL 300 MG/ML  SOLN COMPARISON:  CT scan 07/10/2022 FINDINGS: Lower chest: Stable borderline cardiac enlargement. No pericardial effusion. No pulmonary infiltrates or pleural effusion. Hepatobiliary: Advanced cirrhotic changes involving the liver as seen on prior imaging studies. Portal venous hypertension and extensive portal venous collaterals again demonstrated. The portal vein thrombus has resolved. Persistent 3.2 cm mass associated with the right hepatic lobe worrisome for HCC. Stable distended gallbladder with mild wall thickening and calcified gallstones. No common bile duct dilatation. Pancreas: No mass, inflammation or ductal dilatation. Spleen:  Normal size.  No focal lesions. Adrenals/Urinary Tract: Adrenal glands and kidneys are unremarkable. Stable small renal calculi. The bladder is unremarkable. Stomach/Bowel: The stomach, duodenum, small and colon are grossly no. No acute inflammatory process or obstructive findings. Stable moderate-sized periumbilical abdominal hernia containing bowel. No findings for incarceration or obstruction. Vascular/Lymphatic: The aorta and branch vessels are patent. The major venous structures are patent. Stable appearing extensive portal venous collaterals. Stable scattered upper abdominal lymph nodes typical with cirrhosis. Diffuse mesenteric edema without overt ascites. Reproductive: The prostate gland and seminal vesicles are unremarkable. Other: No abdominal wall hernia or abnormality. No abdominopelvic ascites. Musculoskeletal: No significant bony findings. IMPRESSION: 1. Advanced cirrhotic changes involving the liver with portal venous hypertension, extensive portal venous collaterals and diffuse mesenteric edema without overt ascites. 2. Persistent 3.2 cm mass associated with the right hepatic lobe worrisome for HCC. 3. Stable distended gallbladder with mild wall thickening and calcified gallstones. 4. Stable moderate-sized periumbilical abdominal hernia containing bowel. No findings for incarceration or obstruction. 5. Stable small bilateral renal calculi. Electronically Signed   By: Rudie Meyer M.D.   On: 03/16/2023 10:10    Radiological Exams on Admission: CT ABDOMEN PELVIS W CONTRAST  Result Date: 03/16/2023 CLINICAL DATA:  Left lower quadrant abdominal pain and rectal bleeding. EXAM: CT ABDOMEN AND PELVIS WITH CONTRAST TECHNIQUE: Multidetector CT imaging of the abdomen and pelvis was performed using the standard protocol following bolus administration of intravenous contrast. RADIATION DOSE REDUCTION: This exam was performed according to the departmental dose-optimization program which includes automated  exposure control, adjustment of the mA and/or kV according to patient size and/or use of iterative reconstruction technique. CONTRAST:  OMNIPAQUE IOHEXOL 300 MG/ML  SOLN COMPARISON:  CT scan 07/10/2022 FINDINGS: Lower chest: Stable borderline cardiac enlargement. No pericardial effusion. No pulmonary infiltrates or pleural effusion. Hepatobiliary: Advanced cirrhotic changes involving the liver as seen on prior imaging studies. Portal venous hypertension and extensive portal venous collaterals again demonstrated. The portal vein thrombus has resolved. Persistent 3.2 cm mass associated with the right hepatic lobe worrisome for HCC. Stable distended gallbladder with mild wall thickening and calcified gallstones.  No common bile duct dilatation. Pancreas: No mass, inflammation or ductal dilatation. Spleen: Normal size.  No focal lesions. Adrenals/Urinary Tract: Adrenal glands and kidneys are unremarkable. Stable small renal calculi. The bladder is unremarkable. Stomach/Bowel: The stomach, duodenum, small and colon are grossly no. No acute inflammatory process or obstructive findings. Stable moderate-sized periumbilical abdominal hernia containing bowel. No findings for incarceration or obstruction. Vascular/Lymphatic: The aorta and branch vessels are patent. The major venous structures are patent. Stable appearing extensive portal venous collaterals. Stable scattered upper abdominal lymph nodes typical with cirrhosis. Diffuse mesenteric edema without overt ascites. Reproductive: The prostate gland and seminal vesicles are unremarkable. Other: No abdominal wall hernia or abnormality. No abdominopelvic ascites. Musculoskeletal: No significant bony findings. IMPRESSION: 1. Advanced cirrhotic changes involving the liver with portal venous hypertension, extensive portal venous collaterals and diffuse mesenteric edema without overt ascites. 2. Persistent 3.2 cm mass associated with the right hepatic lobe worrisome for  HCC. 3. Stable distended gallbladder with mild wall thickening and calcified gallstones. 4. Stable moderate-sized periumbilical abdominal hernia containing bowel. No findings for incarceration or obstruction. 5. Stable small bilateral renal calculi. Electronically Signed   By: Rudie Meyer M.D.   On: 03/16/2023 10:10    DVT Prophylaxis -SCD (Gi Bleed) AM Labs Ordered, also please review Full Orders  Family Communication: Admission, patients condition and plan of care including tests being ordered have been discussed with the patient  who indicate understanding and agree with the plan   Condition -stable  Shon Hale M.D on 03/16/2023 at 3:53 PM Go to [email or web address] -  for contact info  [facility] - Office  [phone]

## 2023-03-16 NOTE — ED Provider Notes (Signed)
[facility] MEDICAL SURGICAL UNIT Provider Note  CSN: [id number] Arrival date & time: 03/16/23 0272  Chief Complaint(s) Blood In Stools  HPI Antonio [name] is a 52 y.o. male with past medical history as below, significant for alcohol abuse, cirrhosis, HCC, hepatic encephalopathy, CHF who presents to the ED with complaint of abdominal pain, rectal bleeding  Patient with periumbilical, left lower quadrant abdominal pain over the past 2 to 3 days, noted having blood in his stool over the past 1-2 days.  Bright red blood mixed with dark blood.  No epigastric pain, no current alcohol use per patient.  No hematemesis or vomiting.  No blood thinners.  No rectal trauma.  He noticed blood in the toilet and on toilet paper.  No bleeding between bowel movements.  No rectal pain with defecation.  Denies melena.  Reports had colonoscopy many years ago and was told it was normal per patient  Past Medical History 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HCC)    History of gout    Patient Active Problem List   Diagnosis Date Noted   Acute GI bleeding 03/16/2023   HCC (hepatocellular carcinoma) (HCC) 02/01/2022   Liver lesion 01/30/2022   History of Helicobacter pylori infection 08/07/2021   History of colonic polyps 08/01/2021   Closed fracture of right ankle    Closed trimalleolar fracture of right ankle 05/30/2021   Obesity, morbid (HCC) 07/23/2018   Iron deficiency anemia due to chronic blood loss 09/10/2017   Hepatic encephalopathy (HCC) 03/25/2016   Seizures (HCC) 03/25/2016   Rectal bleeding    CHF (congestive heart failure) (HCC) 03/06/2016   Anemia 12/27/2015   Alcohol abuse 12/27/2015   Alcoholic cirrhosis of liver without ascites (HCC)    Home Medication(s) Prior to Admission medications   Medication Sig Start Date End Date Taking? Authorizing Provider  IBU 800 MG tablet Take 800 mg by mouth every 8 (eight) hours as  needed. 12/01/21   [provider]  lactulose (CHRONULAC) 10 GM/15ML solution TAKE TWO TABLESPOONFUL (30 ML) BY MOUTH THREE TIMES DAILY Patient taking differently: Take 10 g by mouth as needed for moderate constipation. 03/01/21   Tiffany Kocher, PA-C  pantoprazole (PROTONIX) 40 MG tablet Take 1 tablet (40 mg total) by mouth 2 (two) times daily before a meal. 08/21/21 02/17/22  Lanelle Bal, DO  potassium chloride SA (KLOR-CON M) 20 MEQ tablet Take 1 tablet (20 mEq total) by mouth daily for 4 days. 07/10/22 07/14/22  Rondel Baton, MD  torsemide (DEMADEX) 20 MG tablet TAKE 1 TABLET BY MOUTH DAILY TAKE 1/2 TABLE WHEN PATIENT HAS EXTRA SWELLING Patient taking differently: Take 10-20 mg by mouth as needed (fluid). 05/12/19   Gelene Mink, NP  Past Surgical History Past Surgical History:  Procedure Laterality Date   BIOPSY  03/08/2016   Procedure: BIOPSY;  Surgeon: Corbin Ade, MD;  [facility]: [facility];  Service: Endoscopy;;  descending colon biopsies   BIOPSY  04/14/2019   Procedure: BIOPSY;  Surgeon: West Bali, MD;  [facility]: [facility];  Service: Endoscopy;;   BIOPSY  08/21/2021   Procedure: BIOPSY;  Surgeon: Lanelle Bal, DO;  [facility]: [facility];  Service: Endoscopy;;   Colonoscopy     per patient around 2014 in [place]    COLONOSCOPY WITH PROPOFOL [name] 03/08/2016   One 8 mm polyp in the descending colon, removed with a hot snare. Resected and Retrieved.One 4 mm polyp in the cecum, removed with a cold snare. Resected and retrieved. Rectal varices. Melanosis coli. Abnormal left colon mucosa status post biopsy. Tubular adenomas. Surveillance was due 2022.   COLONOSCOPY WITH PROPOFOL [name] 08/21/2021   Procedure: COLONOSCOPY WITH PROPOFOL;  Surgeon: Lanelle Bal, DO;  [facility]: [facility];  Service: Endoscopy;  Laterality: [name];   11:15am   ESOPHAGOGASTRODUODENOSCOPY (EGD) WITH PROPOFOL [name] 12/28/2015   Dr. Darrick Penna: 4 columns of esophageal varices, 3 grade 1, 1 grade 2 with no evidence of bleeding. Mild portal gastropathy with no evidence of active bleeding.   ESOPHAGOGASTRODUODENOSCOPY (EGD) WITH PROPOFOL [name] 03/08/2016   Procedure: ESOPHAGOGASTRODUODENOSCOPY (EGD) WITH PROPOFOL;  Surgeon: Corbin Ade, MD;  [facility];  Service: Endoscopy;  Laterality: [name];   ESOPHAGOGASTRODUODENOSCOPY (EGD) WITH PROPOFOL [name] 04/14/2019   Grade 1 and 2 esophageal varices without bleeding and no stigmata of bleeding. moderate portal gastropath, mild erosive gastritis. Path with positive H.pylori   ESOPHAGOGASTRODUODENOSCOPY (EGD) WITH PROPOFOL [name] 08/21/2021   Procedure: ESOPHAGOGASTRODUODENOSCOPY (EGD) WITH PROPOFOL;  Surgeon: Lanelle Bal, DO;  [facility];  Service: Endoscopy;  Laterality: [name];   GIVENS CAPSULE STUDY [name] 07/05/2017   Procedure: GIVENS CAPSULE STUDY;  Surgeon: West Bali, MD;  [facility]: [facility];  Service: Endoscopy;  Laterality: [name];  7:30am   GIVENS CAPSULE STUDY [name] 08/05/2017   Procedure: GIVENS CAPSULE STUDY;  Surgeon: West Bali, MD;  [facility];  Service: Endoscopy;  Laterality: [name];  7:30am   ORIF ANKLE FRACTURE Right 06/02/2021   Procedure: OPEN REDUCTION INTERNAL FIXATION (ORIF) ANKLE FRACTURE;  Surgeon: Vickki Hearing, MD;  Location: [facility];  Service: Orthopedics;  Laterality: Right;   POLYPECTOMY  03/08/2016   Procedure: POLYPECTOMY;  Surgeon: Corbin Ade, MD;  [facility];  Service: Endoscopy;;  cecum and descending    POLYPECTOMY  08/21/2021   Procedure: POLYPECTOMY INTESTINAL;  Surgeon: Lanelle Bal, DO;  [facility];  Service: Endoscopy;;   Family History Family History  Problem Relation Age of Onset   Colon cancer Neg Hx    Liver disease Neg Hx     Social History Social History   Tobacco Use    Smoking status: Former    Current packs/day: 0.00    Average packs/day: 0.3 packs/day for 8.0 years (2.0 ttl pk-yrs)    Types: Cigarettes    Start date: 2008    Quit date: 2016    Years since quitting: 8.8    Passive exposure: Current   Smokeless tobacco: Never  Vaping Use   Vaping status: Never Used  Substance Use Topics   Alcohol use: No    Comment: former--quit after 12/2015 hospitalization   Drug use: No   Allergies  Patient has no known allergies.  Review of Systems Review of Systems  Constitutional:  Negative for chills and fever.  Respiratory:  Negative for cough and shortness of breath.   Cardiovascular:  Negative for chest pain.  Gastrointestinal:  Positive for abdominal pain and blood in stool.  Musculoskeletal:  Negative for back pain.  Skin:  Negative for pallor.  Neurological:  Negative for syncope.  All other systems reviewed and are negative.   Physical Exam Vital Signs  I have reviewed the triage vital signs BP 122/80 (BP Location: Left Arm)   Pulse 81   Temp (!) 97.5 F (36.4 C)   Resp 17   Ht 6\' 2"  (1.88 m)   Wt (!) 136.5 kg   SpO2 100%   BMI 38.64 kg/m  Physical Exam Vitals and nursing note reviewed.  Constitutional:      General: He is not in acute distress.    Appearance: He is well-developed.  HENT:     Head: Normocephalic and atraumatic.     Right Ear: External ear normal.     Left Ear: External ear normal.     Mouth/Throat:     Mouth: Mucous membranes are moist.  Eyes:     General: No scleral icterus. Cardiovascular:     Rate and Rhythm: Normal rate and regular rhythm.     Pulses: Normal pulses.     Heart sounds: Normal heart sounds.  Pulmonary:     Effort: Pulmonary effort is normal. No respiratory distress.     Breath sounds: Normal breath sounds.  Abdominal:     General: Abdomen is flat.     Palpations: Abdomen is soft.     Tenderness: There is abdominal tenderness. There is no guarding or rebound.    Musculoskeletal:      Cervical back: No rigidity.     Right lower leg: No edema.     Left lower leg: No edema.  Skin:    General: Skin is warm and dry.     Capillary Refill: Capillary refill takes less than 2 seconds.  Neurological:     Mental Status: He is alert.  Psychiatric:        Mood and Affect: Mood normal.        Behavior: Behavior normal.     ED Results and Treatments Labs (all labs ordered are listed, but only abnormal results are displayed) Labs Reviewed  CBC WITH DIFFERENTIAL/PLATELET - Abnormal; Notable for the following components:      Result Value   RBC 3.96 (*)    Hemoglobin 11.4 (*)    HCT 35.6 (*)    RDW 19.7 (*)    Platelets 140 (*)    All other components within normal limits  COMPREHENSIVE METABOLIC PANEL - Abnormal; Notable for the following components:   Calcium 7.8 (*)    Total Protein 6.1 (*)    Albumin 2.2 (*)    AST 106 (*)    Alkaline Phosphatase 175 (*)    Total Bilirubin 3.4 (*)    All other components within normal limits  URINALYSIS, ROUTINE W REFLEX MICROSCOPIC - Abnormal; Notable for the following components:   Color, Urine AMBER (*)    Specific Gravity, Urine >1.046 (*)    Hgb urine dipstick SMALL (*)    Bilirubin Urine SMALL (*)    Protein, ur 30 (*)    All other components within normal limits  PROTIME-INR - Abnormal; Notable for the following components:   Prothrombin Time 16.2 (*)  INR 1.3 (*)    All other components within normal limits  LIPASE, BLOOD  HEMOGLOBIN AND HEMATOCRIT, BLOOD  HEMOGLOBIN AND HEMATOCRIT, BLOOD  HIV ANTIBODY (ROUTINE TESTING W REFLEX)  COMPREHENSIVE METABOLIC PANEL  PROTIME-INR  POC OCCULT BLOOD, ED  TYPE AND SCREEN                                                                                                                          Radiology CT ABDOMEN PELVIS W CONTRAST  Result Date: 03/16/2023 CLINICAL DATA:  Left lower quadrant abdominal pain and rectal bleeding. EXAM: CT ABDOMEN AND PELVIS WITH CONTRAST  TECHNIQUE: Multidetector CT imaging of the abdomen and pelvis was performed using the standard protocol following bolus administration of intravenous contrast. RADIATION DOSE REDUCTION: This exam was performed according to the departmental dose-optimization program which includes automated exposure control, adjustment of the mA and/or kV according to patient size and/or use of iterative reconstruction technique. CONTRAST:  OMNIPAQUE IOHEXOL 300 MG/ML  SOLN COMPARISON:  CT scan 07/10/2022 FINDINGS: Lower chest: Stable borderline cardiac enlargement. No pericardial effusion. No pulmonary infiltrates or pleural effusion. Hepatobiliary: Advanced cirrhotic changes involving the liver as seen on prior imaging studies. Portal venous hypertension and extensive portal venous collaterals again demonstrated. The portal vein thrombus has resolved. Persistent 3.2 cm mass associated with the right hepatic lobe worrisome for HCC. Stable distended gallbladder with mild wall thickening and calcified gallstones. No common bile duct dilatation. Pancreas: No mass, inflammation or ductal dilatation. Spleen: Normal size.  No focal lesions. Adrenals/Urinary Tract: Adrenal glands and kidneys are unremarkable. Stable small renal calculi. The bladder is unremarkable. Stomach/Bowel: The stomach, duodenum, small and colon are grossly no. No acute inflammatory process or obstructive findings. Stable moderate-sized periumbilical abdominal hernia containing bowel. No findings for incarceration or obstruction. Vascular/Lymphatic: The aorta and branch vessels are patent. The major venous structures are patent. Stable appearing extensive portal venous collaterals. Stable scattered upper abdominal lymph nodes typical with cirrhosis. Diffuse mesenteric edema without overt ascites. Reproductive: The prostate gland and seminal vesicles are unremarkable. Other: No abdominal wall hernia or abnormality. No abdominopelvic ascites. Musculoskeletal: No  significant bony findings. IMPRESSION: 1. Advanced cirrhotic changes involving the liver with portal venous hypertension, extensive portal venous collaterals and diffuse mesenteric edema without overt ascites. 2. Persistent 3.2 cm mass associated with the right hepatic lobe worrisome for HCC. 3. Stable distended gallbladder with mild wall thickening and calcified gallstones. 4. Stable moderate-sized periumbilical abdominal hernia containing bowel. No findings for incarceration or obstruction. 5. Stable small bilateral renal calculi. Electronically Signed   By: Rudie Meyer M.D.   On: 03/16/2023 10:10    Pertinent labs & imaging results that were available during my care of the patient were reviewed by me and considered in my medical decision making (see MDM for details).  Medications Ordered in ED Medications  pantoprazole (PROTONIX) injection 40 mg (40 mg Intravenous Not Given 03/16/23 1611)  sodium chloride flush (NS) 0.9 % injection  3 mL (3 mLs Intravenous Given 03/16/23 1611)  sodium chloride flush (NS) 0.9 % injection 3 mL (3 mLs Intravenous Not Given 03/16/23 1611)  sodium chloride flush (NS) 0.9 % injection 3 mL (has no administration in time range)  0.9 %  sodium chloride infusion (has no administration in time range)  acetaminophen (TYLENOL) tablet 650 mg (has no administration in time range)    Or  acetaminophen (TYLENOL) suppository 650 mg (has no administration in time range)  polyethylene glycol (MIRALAX / GLYCOLAX) packet 17 g (has no administration in time range)  bisacodyl (DULCOLAX) suppository 10 mg (has no administration in time range)  ondansetron (ZOFRAN) tablet 4 mg (has no administration in time range)    Or  ondansetron (ZOFRAN) injection 4 mg (has no administration in time range)  dextrose 5 %-0.9 % sodium chloride infusion ( Intravenous New Bag/Given 03/16/23 1610)  iohexol (OMNIPAQUE) 300 MG/ML solution 100 mL (100 mLs Intravenous Contrast Given 03/16/23 0928)  alum &  mag hydroxide-simeth (MAALOX/MYLANTA) 200-200-20 MG/5ML suspension 15 mL (15 mLs Oral Given 03/16/23 1024)  pantoprazole (PROTONIX) injection 40 mg (40 mg Intravenous Given 03/16/23 1024)  morphine (PF) 4 MG/ML injection 4 mg (4 mg Intravenous Given 03/16/23 1025)  ondansetron (ZOFRAN) injection 4 mg (4 mg Intravenous Given 03/16/23 1023)  sodium chloride 0.9 % bolus 500 mL (0 mLs Intravenous Stopped 03/16/23 1125)                                                                                                                                     Procedures Procedures  (including critical care time)  Medical Decision Making / ED Course    Medical Decision Making:    Chukwuemeka Carra is a 52 y.o. male  with past medical history as below, significant for alcohol abuse, cirrhosis, HCC, hepatic encephalopathy, CHF who presents to the ED with complaint of abdominal pain, rectal bleeding. The complaint involves an extensive differential diagnosis and also carries with it a high risk of complications and morbidity.  Serious etiology was considered. Ddx includes but is not limited to: Differential diagnosis includes but is not exclusive to acute appendicitis, renal colic, testicular torsion, urinary tract infection, prostatitis,  diverticulitis, small bowel obstruction, colitis, abdominal aortic aneurysm, gastroenteritis, constipation etc.   Complete initial physical exam performed, notably the patient  was HDS, abdomen nonperitoneal.    Reviewed and confirmed nursing documentation for past medical history, family history, social history.  Vital signs reviewed.    Clinical Course as of 03/16/23 1712  Sat Mar 16, 2023  0924 Hemoglobin(!): 11.4 Similar to prior  [SG]  0924 Alkaline Phosphatase(!): 175 [SG]  0924 Total Bilirubin(!): 3.4 Similar to prior  [SG]  1047 Hgb is stable, he has been having intermittent rectal bleeding over last 2 days. No thinners.  [SG]    Clinical Course User Index [SG]  Tanda Rockers A, DO     Hemoccult was  positive per nursing  CT reviewed, gradually worsening chronic changes consistent with known malignancy.  Stable umbilical hernia, stable gallbladder abnormality  Labs stable, patient continues to have bright red blood per rectum interittently.  Rectal exam is stable.  Hgb stable, heme +tive stool. Given patient's ongoing bleeding recommend observation with serial H&H given he is high risk for bleeding given liver dz  Spoke with Dr. Levon Hedger, okay with patient staying here.  Admit to hospitalist                Additional history obtained: -Additional history obtained from spouse -External records from outside source obtained and reviewed including: Chart review including previous notes, labs, imaging, consultation notes including  Prior ED visits, primary care documentation, home medications   Lab Tests: -I ordered, reviewed, and interpreted labs.   The pertinent results include:   Labs Reviewed  CBC WITH DIFFERENTIAL/PLATELET - Abnormal; Notable for the following components:      Result Value   RBC 3.96 (*)    Hemoglobin 11.4 (*)    HCT 35.6 (*)    RDW 19.7 (*)    Platelets 140 (*)    All other components within normal limits  COMPREHENSIVE METABOLIC PANEL - Abnormal; Notable for the following components:   Calcium 7.8 (*)    Total Protein 6.1 (*)    Albumin 2.2 (*)    AST 106 (*)    Alkaline Phosphatase 175 (*)    Total Bilirubin 3.4 (*)    All other components within normal limits  URINALYSIS, ROUTINE W REFLEX MICROSCOPIC - Abnormal; Notable for the following components:   Color, Urine AMBER (*)    Specific Gravity, Urine >1.046 (*)    Hgb urine dipstick SMALL (*)    Bilirubin Urine SMALL (*)    Protein, ur 30 (*)    All other components within normal limits  PROTIME-INR - Abnormal; Notable for the following components:   Prothrombin Time 16.2 (*)    INR 1.3 (*)    All other components within normal limits   LIPASE, BLOOD  HEMOGLOBIN AND HEMATOCRIT, BLOOD  HEMOGLOBIN AND HEMATOCRIT, BLOOD  HIV ANTIBODY (ROUTINE TESTING W REFLEX)  COMPREHENSIVE METABOLIC PANEL  PROTIME-INR  POC OCCULT BLOOD, ED  TYPE AND SCREEN    Notable for labs stable  EKG   EKG Interpretation Date/Time:    Ventricular Rate:    PR Interval:    QRS Duration:    QT Interval:    QTC Calculation:   R Axis:      Text Interpretation:           Imaging Studies ordered: I ordered imaging studies including CTAP I independently visualized the following imaging with scope of interpretation limited to determining acute life threatening conditions related to emergency care; findings noted above I independently visualized and interpreted imaging. I agree with the radiologist interpretation   Medicines ordered and prescription drug management: Meds ordered this encounter  Medications   iohexol (OMNIPAQUE) 300 MG/ML solution 100 mL   alum & mag hydroxide-simeth (MAALOX/MYLANTA) 200-200-20 MG/5ML suspension 15 mL   pantoprazole (PROTONIX) injection 40 mg   morphine (PF) 4 MG/ML injection 4 mg   ondansetron (ZOFRAN) injection 4 mg   sodium chloride 0.9 % bolus 500 mL   pantoprazole (PROTONIX) injection 40 mg   sodium chloride flush (NS) 0.9 % injection 3 mL   sodium chloride flush (NS) 0.9 % injection 3 mL   sodium chloride flush (NS) 0.9 % injection 3 mL  0.9 %  sodium chloride infusion   OR Linked Order Group    acetaminophen (TYLENOL) tablet 650 mg    acetaminophen (TYLENOL) suppository 650 mg   polyethylene glycol (MIRALAX / GLYCOLAX) packet 17 g   bisacodyl (DULCOLAX) suppository 10 mg   OR Linked Order Group    ondansetron (ZOFRAN) tablet 4 mg    ondansetron (ZOFRAN) injection 4 mg   dextrose 5 %-0.9 % sodium chloride infusion    -I have reviewed the patients home medicines and have made adjustments as needed   Consultations Obtained: I requested consultation with the castaneda,  and discussed lab  and imaging findings as well as pertinent plan - they recommend: admit hospitalist    Cardiac Monitoring: The patient was maintained on a cardiac monitor.  I personally viewed and interpreted the cardiac monitored which showed an underlying rhythm of: NSR  Social Determinants of Health:  Diagnosis or treatment significantly limited by social determinants of health: former smoker   Reevaluation: After the interventions noted above, I reevaluated the patient and found that they have improved  Co morbidities that complicate the patient evaluation  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HCC)    History of gout       Dispostion: Disposition decision including need for hospitalization was considered, and patient admitted to the hospital.    Final Clinical Impression(s) / ED Diagnoses Final diagnoses:  Lower GI bleed        Sloan Leiter, DO 03/16/23 1712

## 2023-03-16 NOTE — ED Notes (Signed)
Patient transported to CT 

## 2023-03-16 NOTE — ED Triage Notes (Signed)
Pt states he is having blood in stool and states last night the bleeding was worse. States blood is bright red. Reports no other symptoms.

## 2023-03-17 DIAGNOSIS — K922 Gastrointestinal hemorrhage, unspecified: Secondary | ICD-10-CM | POA: Diagnosis not present

## 2023-03-17 DIAGNOSIS — K625 Hemorrhage of anus and rectum: Secondary | ICD-10-CM

## 2023-03-17 LAB — COMPREHENSIVE METABOLIC PANEL
ALT: 37 U/L (ref 0–44)
AST: 98 U/L — ABNORMAL HIGH (ref 15–41)
Albumin: 2.2 g/dL — ABNORMAL LOW (ref 3.5–5.0)
Alkaline Phosphatase: 160 U/L — ABNORMAL HIGH (ref 38–126)
Anion gap: 7 (ref 5–15)
BUN: 10 mg/dL (ref 6–20)
CO2: 22 mmol/L (ref 22–32)
Calcium: 7.9 mg/dL — ABNORMAL LOW (ref 8.9–10.3)
Chloride: 106 mmol/L (ref 98–111)
Creatinine, Ser: 0.88 mg/dL (ref 0.61–1.24)
GFR, Estimated: >60 mL/min (ref >60)
Glucose, Bld: 72 mg/dL (ref 70–99)
Potassium: 3.4 mmol/L — ABNORMAL LOW (ref 3.5–5.1)
Sodium: 135 mmol/L (ref 135–145)
Total Bilirubin: 4.3 mg/dL — ABNORMAL HIGH (ref 0.3–1.2)
Total Protein: 6.3 g/dL — ABNORMAL LOW (ref 6.5–8.1)

## 2023-03-17 LAB — HEMOGLOBIN AND HEMATOCRIT, BLOOD
HCT: 31.6 % — ABNORMAL LOW (ref 39.0–52.0)
HCT: 36.2 % — ABNORMAL LOW (ref 39.0–52.0)
HCT: 35.5 % — ABNORMAL LOW (ref 39.0–52.0)
Hemoglobin: 10.4 g/dL — ABNORMAL LOW (ref 13.0–17.0)
Hemoglobin: 11.2 g/dL — ABNORMAL LOW (ref 13.0–17.0)
Hemoglobin: 11.5 g/dL — ABNORMAL LOW (ref 13.0–17.0)

## 2023-03-17 LAB — PROTIME-INR
INR: 1.4 — ABNORMAL HIGH (ref 0.8–1.2)
Prothrombin Time: 17.2 s — ABNORMAL HIGH (ref 11.4–15.2)

## 2023-03-17 MED ORDER — POTASSIUM CHLORIDE CRYS ER 20 MEQ PO TBCR
40.0000 meq | EXTENDED_RELEASE_TABLET | Freq: Once | ORAL | Status: AC
  Administered 2023-03-17: 40 meq via ORAL
  Filled 2023-03-17: qty 2

## 2023-03-17 MED ORDER — PANTOPRAZOLE SODIUM 40 MG PO TBEC: 40.0000 mg | DELAYED_RELEASE_TABLET | Freq: Every day | ORAL | 1 refills | Status: DC

## 2023-03-17 MED ORDER — ACETAMINOPHEN 325 MG PO TABS: 650.0000 mg | ORAL_TABLET | Freq: Four times a day (QID) | ORAL | Status: DC | PRN

## 2023-03-17 MED ORDER — HYDROCORTISONE ACETATE 25 MG RE SUPP: 25.0000 mg | Freq: Two times a day (BID) | RECTAL | 1 refills | Status: DC

## 2023-03-17 MED ORDER — FERROUS SULFATE 325 (65 FE) MG PO TBEC: 325.0000 mg | DELAYED_RELEASE_TABLET | Freq: Every day | ORAL | 3 refills | Status: DC

## 2023-03-17 NOTE — Progress Notes (Signed)
Patient refused both SCDs and TED hose

## 2023-03-17 NOTE — Consult Note (Signed)
Katrinka Blazing, M.D. Gastroenterology & Hepatology                                           Patient Name: Antonio [name] Account #: @[email or web address] @   MRN: [id number] Admission Date: 03/16/2023 Date of Evaluation:  03/17/2023 Time of Evaluation: 11:20 AM   Referring Physician: Shon Hale, MD  Chief Complaint: Rectal bleeding  HPI:  This is a 52 y.o. male with history of alcoholic cirrhosis complicated by hepatocellular carcinoma and , non-bleeding esophageal varices and gout, who was at the hospital after presenting rectal bleeding.  Patient reports that he presented new onset of fresh blood since Friday morning.  He had a total of 4 bowel movements in the next 48 hours which made him concerned.  He denies having abdominal pain, nausea, vomiting, fever, chills, melena, hematemesis, nausea or vomiting.  Denies taking any anticoagulants or NSAIDs.    He Underwent  endoscopic evaluation with Dr. Marletta Lor in the past.  Both EGD and colonoscopy were performed on 08/21/2021.  Colonoscopy showed hemorrhoids and a 2 mm polyp was removed, he had grade 1 esophageal varices ,  Gastritis with portal persistent gastropathy, also had duodenitis.  In the ED, he was HD stable and afebrile. Labs were remarkable for hemoglobin of 11.4, MCV 89, platelets 140, WBC 4.3, CMP with alkaline phosphatase 175, albumin 2.2, AST 106 ALT 38, a CT of the abdomen and pelvis with IV contrast was performed yesterday that showed cirrhosis with portal venous hypertension without ascites and a 3.2 cm mass consistent with HCC.  Patient reports that he is rectal bleeding has subsided and he has not presented any more complaints.  Most recent hemoglobin today was 11.5.  Past Medical History: SEE CHRONIC ISSSUES: 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HCC)    History of gout    Past Surgical History:  Past Surgical History:  Procedure Laterality Date    BIOPSY  03/08/2016   Procedure: BIOPSY;  Surgeon: Corbin Ade, MD;  [facility];  Service: Endoscopy;;  descending colon biopsies   BIOPSY  04/14/2019   Procedure: BIOPSY;  Surgeon: West Bali, MD;  [facility];  Service: Endoscopy;;   BIOPSY  08/21/2021   Procedure: BIOPSY;  Surgeon: Lanelle Bal, DO;  [facility];  Service: Endoscopy;;   Colonoscopy     per patient around 2014 in [place]    COLONOSCOPY WITH PROPOFOL [name] 03/08/2016   One 8 mm polyp in the descending colon, removed with a hot snare. Resected and Retrieved.One 4 mm polyp in the cecum, removed with a cold snare. Resected and retrieved. Rectal varices. Melanosis coli. Abnormal left colon mucosa status post biopsy. Tubular adenomas. Surveillance was due 2022.   COLONOSCOPY WITH PROPOFOL [name] 08/21/2021   Procedure: COLONOSCOPY WITH PROPOFOL;  Surgeon: Lanelle Bal, DO;  [facility];  Service: Endoscopy;  Laterality: [name];  11:15am   ESOPHAGOGASTRODUODENOSCOPY (EGD) WITH PROPOFOL [name] 12/28/2015   Dr. Darrick Penna: 4 columns of esophageal varices, 3 grade 1, 1 grade 2 with no evidence of bleeding. Mild portal gastropathy with no evidence of active bleeding.   ESOPHAGOGASTRODUODENOSCOPY (EGD) WITH PROPOFOL [name] 03/08/2016   Procedure: ESOPHAGOGASTRODUODENOSCOPY (EGD) WITH PROPOFOL;  Surgeon: Corbin Ade, MD;  [facility];  Service: Endoscopy;  Laterality: [name];   ESOPHAGOGASTRODUODENOSCOPY (EGD) WITH PROPOFOL [name] 04/14/2019   Grade 1 and 2 esophageal varices without bleeding and no stigmata of bleeding. moderate portal gastropath, mild erosive gastritis. Path with positive H.pylori   ESOPHAGOGASTRODUODENOSCOPY (EGD) WITH PROPOFOL [name] 08/21/2021   Procedure: ESOPHAGOGASTRODUODENOSCOPY (EGD) WITH PROPOFOL;  Surgeon: Lanelle Bal, DO;  [facility];  Service: Endoscopy;  Laterality: [name];   GIVENS CAPSULE STUDY [name] 07/05/2017   Procedure: GIVENS CAPSULE STUDY;   Surgeon: West Bali, MD;  [facility];  Service: Endoscopy;  Laterality: [name];  7:30am   GIVENS CAPSULE STUDY [name] 08/05/2017   Procedure: GIVENS CAPSULE STUDY;  Surgeon: West Bali, MD;  [facility];  Service: Endoscopy;  Laterality: [name];  7:30am   ORIF ANKLE FRACTURE Right 06/02/2021   Procedure: OPEN REDUCTION INTERNAL FIXATION (ORIF) ANKLE FRACTURE;  Surgeon: Vickki Hearing, MD;  Location: [facility];  Service: Orthopedics;  Laterality: Right;   POLYPECTOMY  03/08/2016   Procedure: POLYPECTOMY;  Surgeon: Corbin Ade, MD;  [facility];  Service: Endoscopy;;  cecum and descending    POLYPECTOMY  08/21/2021   Procedure: POLYPECTOMY INTESTINAL;  Surgeon: Lanelle Bal, DO;  [facility];  Service: Endoscopy;;   Family History:  Family History  Problem Relation Age of Onset   Colon cancer Neg Hx    Liver disease Neg Hx    Social History:  Social History   Tobacco Use   Smoking status: Former    Current packs/day: 0.00    Average packs/day: 0.3 packs/day for 8.0 years (2.0 ttl pk-yrs)    Types: Cigarettes    Start date: 2008    Quit date: 2016    Years since quitting: 8.8    Passive exposure: Current   Smokeless tobacco: Never  Vaping Use   Vaping status: Never Used  Substance Use Topics   Alcohol use: No    Comment: former--quit after 12/2015 hospitalization   Drug use: No    Home Medications:  Prior to Admission medications   Medication Sig Start Date End Date Taking? Authorizing Provider  torsemide (DEMADEX) 20 MG tablet TAKE 1 TABLET BY MOUTH DAILY TAKE 1/2 TABLE WHEN PATIENT HAS EXTRA SWELLING Patient taking differently: Take 10-20 mg by mouth as needed (fluid). 05/12/19  Yes Gelene Mink, NP  lactulose (CHRONULAC) 10 GM/15ML solution TAKE TWO TABLESPOONFUL (30 ML) BY MOUTH THREE TIMES DAILY Patient taking differently: Take 10 g by mouth as needed for moderate constipation. 03/01/21   Tiffany Kocher, PA-C     Inpatient Medications:  Current Facility-Administered Medications:    0.9 %  sodium chloride infusion, , Intravenous, PRN, Mariea Clonts, Courage, MD   acetaminophen (TYLENOL) tablet 650 mg, 650 mg, Oral, Q6H PRN **OR** acetaminophen (TYLENOL) suppository 650 mg, 650 mg, Rectal, Q6H PRN, Emokpae, Courage, MD   bisacodyl (DULCOLAX) suppository 10 mg, 10 mg, Rectal, Daily PRN, Emokpae, Courage, MD   dextrose 5 %-0.9 % sodium chloride infusion, , Intravenous, Continuous, Emokpae, Courage, MD, Last Rate: 41 mL/hr at 03/16/23 1713, Infusion Verify at 03/16/23 1713   ondansetron (ZOFRAN) tablet 4 mg, 4 mg, Oral, Q6H PRN **OR** ondansetron (ZOFRAN) injection 4 mg, 4 mg, Intravenous, Q6H PRN, Emokpae, Courage, MD   pantoprazole (PROTONIX) injection 40 mg, 40 mg, Intravenous, Q12H, Emokpae, Courage, MD, 40 mg at 03/17/23 0812   polyethylene glycol (MIRALAX / GLYCOLAX) packet 17 g, 17 g, Oral, Daily PRN, Shon Hale, MD  sodium chloride flush (NS) 0.9 % injection 3 mL, 3 mL, Intravenous, Q12H, Emokpae, Courage, MD, 3 mL at 03/16/23 1611   sodium chloride flush (NS) 0.9 % injection 3 mL, 3 mL, Intravenous, Q12H, Emokpae, Courage, MD, 3 mL at 03/16/23 2227   sodium chloride flush (NS) 0.9 % injection 3 mL, 3 mL, Intravenous, PRN, Mariea Clonts, Courage, MD Allergies: Patient has no known allergies.  Complete Review of Systems: GENERAL: negative for malaise, night sweats HEENT: No changes in hearing or vision, no nose bleeds or other nasal problems. NECK: Negative for lumps, goiter, pain and significant neck swelling RESPIRATORY: Negative for cough, wheezing CARDIOVASCULAR: Negative for chest pain, leg swelling, palpitations, orthopnea GI: SEE HPI MUSCULOSKELETAL: Negative for joint pain or swelling, back pain, and muscle pain. SKIN: Negative for lesions, rash PSYCH: Negative for sleep disturbance, mood disorder and recent psychosocial stressors. HEMATOLOGY Negative for prolonged bleeding, bruising easily,  and swollen nodes. ENDOCRINE: Negative for cold or heat intolerance, polyuria, polydipsia and goiter. NEURO: negative for tremor, gait imbalance, syncope and seizures. The remainder of the review of systems is noncontributory.  Physical Exam: BP 122/78 (BP Location: Right Wrist)   Pulse 66   Temp 97.8 F (36.6 C)   Resp 17   Ht 6\' 2"  (1.88 m)   Wt (!) 136.5 kg   SpO2 100%   BMI 38.64 kg/m  GENERAL: The patient is AO x3, in no acute distress. HEENT: Head is normocephalic and atraumatic. EOMI are intact. Mouth is well hydrated and without lesions. NECK: Supple. No masses LUNGS: Clear to auscultation. No presence of rhonchi/wheezing/rales. Adequate chest expansion HEART: RRR, normal s1 and s2. ABDOMEN: Soft, nontender, no guarding, no peritoneal signs, and nondistended. BS +. No masses. RECTAL EXAM: deferred EXTREMITIES: Without any cyanosis, clubbing, rash, lesions or edema. NEUROLOGIC: AOx3, no focal motor deficit. SKIN: no jaundice, no rashes  Laboratory Data CBC:     Component Value Date/Time   WBC 4.3 03/16/2023 0755   RBC 3.96 (L) 03/16/2023 0755   HGB 11.2 (L) 03/17/2023 0555   HGB 6.7 (L) 01/10/2016 1730   HCT 36.2 (L) 03/17/2023 0555   PLT 140 (L) 03/16/2023 0755   MCV 89.9 03/16/2023 0755   MCH 28.8 03/16/2023 0755   MCHC 32.0 03/16/2023 0755   RDW 19.7 (H) 03/16/2023 0755   LYMPHSABS 0.9 03/16/2023 0755   MONOABS 0.7 03/16/2023 0755   EOSABS 0.2 03/16/2023 0755   BASOSABS 0.0 03/16/2023 0755   COAG:  Lab Results  Component Value Date   INR 1.4 (H) 03/17/2023   INR 1.3 (H) 03/16/2023   INR 1.2 (H) 11/01/2021    BMP:     Latest Ref Rng & Units 03/17/2023    5:55 AM 03/16/2023    7:55 AM 07/10/2022    8:40 AM  BMP  Glucose 70 - 99 mg/dL 72  96  96   BUN 6 - 20 mg/dL 10  13  18    Creatinine 0.61 - 1.24 mg/dL 2.95  6.21  3.08   Sodium 135 - 145 mmol/L 135  135  140   Potassium 3.5 - 5.1 mmol/L 3.4  3.8  2.7   Chloride 98 - 111 mmol/L 106  105  96    CO2 22 - 32 mmol/L 22  25    Calcium 8.9 - 10.3 mg/dL 7.9  7.8      HEPATIC:     Latest Ref Rng & Units 03/17/2023    5:55 AM 03/16/2023    7:55  AM 07/10/2022    8:27 AM  Hepatic Function  Total Protein 6.5 - 8.1 g/dL 6.3  6.1  6.9   Albumin 3.5 - 5.0 g/dL 2.2  2.2  2.1   AST 15 - 41 U/L 98  106  77   ALT 0 - 44 U/L 37  38  31   Alk Phosphatase 38 - 126 U/L 160  175  226   Total Bilirubin 0.3 - 1.2 mg/dL 4.3  3.4  3.4     CARDIAC:  Lab Results  Component Value Date   TROPONINI 0.04 (HH) 03/25/2016     Imaging: I personally reviewed and interpreted the available imaging.  Assessment & Plan: Antonio [name] is a 52 y.o. male with history of alcoholic cirrhosis complicated by hepatocellular carcinoma and , non-bleeding esophageal varices and gout, who was at the hospital after presenting rectal bleeding.  Patient presented new onset of rectal bleeding without impact in his hemodynamic status.  In fact his hemoglobin has remained stable without significant drop.  I consider that given his acute presentation and resolution of symptoms, as well as relatively recent colonoscopy with presence of hemorrhoids, his rectal bleeding is likely coming from hemorrhoidal bleeding that has subsided.  He endorsed having to strain prior to presenting his episodes of rectal bleeding.  He would benefit from taking MiraLAX on a regular basis to avoid episodes of constipation.  Will hold off on performing any endoscopic procedures for now.  -Start taking Miralax 1 capful every day or can try lactulose - Patient will follow up in GI clinic in 2-3 weeks.  Katrinka Blazing, MD Gastroenterology and Hepatology [facility] Gastroenterology

## 2023-03-17 NOTE — Discharge Instructions (Signed)
1)Avoid ibuprofen/Advil/Aleve/Motrin/Goody Powders/Naproxen/BC powders/Meloxicam/Diclofenac/Indomethacin and other Nonsteroidal anti-inflammatory medications as these will make you more likely to bleed and can cause stomach ulcers, can also cause Kidney problems.   2)Please Follow up with Gastroenterologist Dr. Levon Hedger-- address [address], [address], [place] [address],,[facility] Number [phone] in 1 to 2 weeks for recheck and reevaluation  3)Please note the changes to your medications  4) use lactulose as advised--- avoid constipation  5) your stools may be black due to iron tablets--- repeat CBC blood test in about a week or so advised

## 2023-03-17 NOTE — Progress Notes (Signed)
Transition of Care [facility]) - Inpatient Brief Assessment   Patient Details  Name: Antonio [name] MRN: [id number] Date of Birth: 04/19/1971  Transition of Care [facility]) CM/SW Contact:    Nikko Goldwire A Ladd Cen, RN Phone Number: 03/17/2023, 10:22 AM   Clinical Narrative: Patient from home admitted with Acute GI Bleed. May go home today. No TOC needs has been identified. TOC will continue to monitor patient advancement through interdisciplinary progression rounds. If new patient transition needs arise, please place a TOC consult.    Transition of Care Asessment: Insurance and Status: (P) Insurance coverage has been reviewed Patient has primary care physician: (P) Yes Home environment has been reviewed: (P) From Home Prior level of function:: (P) Independent Prior/Current Home Services: (P) No current home services Social Determinants of Health Reivew: (P) SDOH reviewed no interventions necessary Readmission risk has been reviewed: (P) Yes

## 2023-03-17 NOTE — Progress Notes (Signed)
Had full liquid tray and then soft diet and ate all of food with no adverse reaction or rectal bleeding. IV removed and scripts sent to [facility]. To call Dr. Earmon Phoenix for follow up appt in a week.  Walked to entrance with wife.

## 2023-03-17 NOTE — Plan of Care (Signed)

## 2023-03-17 NOTE — Discharge Summary (Signed)
Antonio [name], is a 52 y.o. male  DOB 1970-09-24  MRN [id number].  Admission date:  03/16/2023  Admitting Physician  Jenavie Stanczak Mariea Clonts, MD  Discharge Date:  03/17/2023   Primary MD  Kirstie Peri, MD  Recommendations for primary care physician for things to follow:  1)Avoid ibuprofen/Advil/Aleve/Motrin/Goody Powders/Naproxen/BC powders/Meloxicam/Diclofenac/Indomethacin and other Nonsteroidal anti-inflammatory medications as these will make you more likely to bleed and can cause stomach ulcers, can also cause Kidney problems.   2)Please Follow up with Gastroenterologist Dr. Levon Hedger-- address [address], [address], [place] [address],,OZHY[facility] Number [phone] in 1 to 2 weeks for recheck and reevaluation  3)Please note the changes to your medications  4) use lactulose as advised--- avoid constipation  5) your stools may be black due to iron tablets--- repeat CBC blood test in about a week or so advised  Admission Diagnosis  Acute GI bleeding [K92.2] Lower GI bleed [K92.2]   Discharge Diagnosis  Acute GI bleeding [K92.2] Lower GI bleed [K92.2]    Principal Problem:   Acute GI bleeding Active Problems:   Iron deficiency anemia due to chronic blood loss   Rectal bleeding   Obesity, morbid (HCC)   History of colonic polyps   HCC (hepatocellular carcinoma) (HCC)      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HCC)    History of gout     Past Surgical History:  Procedure Laterality Date   BIOPSY  03/08/2016   Procedure: BIOPSY;  Surgeon: Corbin Ade, MD;  [facility];  Service: Endoscopy;;  descending colon biopsies   BIOPSY  04/14/2019   Procedure: BIOPSY;  Surgeon: West Bali, MD;  [facility];  Service: Endoscopy;;   BIOPSY  08/21/2021   Procedure: BIOPSY;  Surgeon: Lanelle Bal, DO;   [facility];  Service: Endoscopy;;   Colonoscopy     per patient around 2014 in [place]    COLONOSCOPY WITH PROPOFOL [name] 03/08/2016   One 8 mm polyp in the descending colon, removed with a hot snare. Resected and Retrieved.One 4 mm polyp in the cecum, removed with a cold snare. Resected and retrieved. Rectal varices. Melanosis coli. Abnormal left colon mucosa status post biopsy. Tubular adenomas. Surveillance was due 2022.   COLONOSCOPY WITH PROPOFOL [name] 08/21/2021   Procedure: COLONOSCOPY WITH PROPOFOL;  Surgeon: Lanelle Bal, DO;  [facility];  Service: Endoscopy;  Laterality: [name];  11:15am   ESOPHAGOGASTRODUODENOSCOPY (EGD) WITH PROPOFOL [name] 12/28/2015   Dr. Darrick Penna: 4 columns of esophageal varices, 3 grade 1, 1 grade 2 with no evidence of bleeding. Mild portal gastropathy with no evidence of active bleeding.   ESOPHAGOGASTRODUODENOSCOPY (EGD) WITH PROPOFOL [name] 03/08/2016   Procedure: ESOPHAGOGASTRODUODENOSCOPY (EGD) WITH PROPOFOL;  Surgeon: Corbin Ade, MD;  [facility];  Service: Endoscopy;  Laterality: [name];   ESOPHAGOGASTRODUODENOSCOPY (EGD) WITH PROPOFOL [name] 04/14/2019   Grade 1 and 2 esophageal varices without bleeding  and no stigmata of bleeding. moderate portal gastropath, mild erosive gastritis. Path with positive H.pylori   ESOPHAGOGASTRODUODENOSCOPY (EGD) WITH PROPOFOL [name] 08/21/2021   Procedure: ESOPHAGOGASTRODUODENOSCOPY (EGD) WITH PROPOFOL;  Surgeon: Lanelle Bal, DO;  [facility];  Service: Endoscopy;  Laterality: [name];   GIVENS CAPSULE STUDY [name] 07/05/2017   Procedure: GIVENS CAPSULE STUDY;  Surgeon: West Bali, MD;  [facility];  Service: Endoscopy;  Laterality: [name];  7:30am   GIVENS CAPSULE STUDY [name] 08/05/2017   Procedure: GIVENS CAPSULE STUDY;  Surgeon: West Bali, MD;  [facility];  Service: Endoscopy;  Laterality: [name];  7:30am   ORIF ANKLE FRACTURE Right 06/02/2021   Procedure: OPEN  REDUCTION INTERNAL FIXATION (ORIF) ANKLE FRACTURE;  Surgeon: Vickki Hearing, MD;  Location: [facility];  Service: Orthopedics;  Laterality: Right;   POLYPECTOMY  03/08/2016   Procedure: POLYPECTOMY;  Surgeon: Corbin Ade, MD;  [facility];  Service: Endoscopy;;  cecum and descending    POLYPECTOMY  08/21/2021   Procedure: POLYPECTOMY INTESTINAL;  Surgeon: Lanelle Bal, DO;  [facility];  Service: Endoscopy;;      HPI  from the history and physical done on the day of admission:   Antonio [name]  is a 52 y.o. 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 pylori gastritis eradicated by documentation in 2023, hepatocellular carcinoma and history of gout with prior endoluminal evaluation showing-:: --Colonoscopy 08/21/2021 with hemorrhoids, one 2 mm polyp s/p removal ( benign aggregates).   EGD 08/21/2021: Grade 1 esophageal varices, gastritis s/p biopsy, portal gastropathy, duodenitis    Who Now presents to the ED with complaints of rectal bleeding since 03/14/2023--patient had both bright red blood per rectum without clots, now having dark stools -No nausea no vomiting- -no significant abdominal discomfort No fever  Or chills  -In the ED INR is 1.3 -Stool occult blood is positive -Hgb is 11.4 which is similar to prior baseline -WBC 4.3, platelets 140  -Lipase WNL =-Creatinine 0.83, AST 106 ALT 38, alk phos 175 with a T. bili of 3.4 all of which is similar to priors -No chest pains and palpitations no dyspnea on exertion, no significant dizziness    Hospital Course:    1)Acute GI/Rectal Bleed-----rectal bleeding since 03/14/2023-- -patient had both bright red blood per rectum without clots, now having dark stools -No nausea no vomiting- -no significant abdominal discomfort No fever   Or chills  --Stool occult blood is positive -Hgb is 11.4 >>10.7 >>10.4 >>11.2-which is similar to prior baseline -Colonoscopy 08/21/2021 with hemorrhoids, one 2 mm polyp s/p removal ( benign aggregates).   EGD 08/21/2021: Grade 1 esophageal varices, gastritis s/p biopsy, portal gastropathy, duodenitis  -Discussed with on-call GI physician Dr. Levon Hedger...  -Patient had brown stool, no further rectal bleeding -Dr. 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 bili of 3.4 all of which is similar to priors -GI input requested -Patient is not encephalopathic   3) iron deficiency anemia--prior workup consistent with iron deficiency anemia --Hgb is 11.4 >>10.7 >>10.4 >>11.2-which is similar to prior baseline -GI consult appreciated -Discharge on iron replacement  4)GERD--- continue Protonix   5)Hepatocellular Carcinoma--defer management to GI team and oncology -Patient has seen Dr. Ellin Saba previously -Outpatient follow-up with GI and oncology advised   6)Morbid Obesity- -Low calorie diet, portion control and increase physical activity discussed with patient -Body mass index is 38.64 kg/m.  7)Hypokalemia----replaced   Dispo: The patient is from: Home              Anticipated d/c is to: Home  Discharge Condition: stable  Follow UP   Follow-up Information     Marguerita Merles, Reuel Boom, MD. Schedule an appointment as soon as possible for a visit in 1 week(s).   Specialty: Gastroenterology Contact information: [address] [place] [address] [phone]                 Consults obtained - -GI  Diet and Activity recommendation:  As advised  Discharge Instructions    Discharge Instructions     Call MD for:  difficulty breathing,  headache or visual disturbances   Complete by: As directed    Call MD for:  persistant dizziness or light-headedness   Complete by: As directed    Call MD for:  persistant nausea and vomiting   Complete by: As directed    Call MD for:  temperature >100.4   Complete by: As directed    Diet - low sodium heart healthy   Complete by: As directed    Discharge instructions   Complete by: As directed    1)Avoid ibuprofen/Advil/Aleve/Motrin/Goody Powders/Naproxen/BC powders/Meloxicam/Diclofenac/Indomethacin and other Nonsteroidal anti-inflammatory medications as these will make you more likely to bleed and can cause stomach ulcers, can also cause Kidney problems.   2)Please Follow up with Gastroenterologist Dr. Levon Hedger-- address [address], [address], [place] [address],,GN[facility] Number [phone] in 1 to 2 weeks for recheck and reevaluation  3)Please note the changes to your medications  4) use lactulose as advised--- avoid constipation  5) your stools may be black due to iron tablets--- repeat CBC blood test in about a week or so advised   Increase activity slowly   Complete by: As directed         Discharge Medications     Allergies as of 03/17/2023   No Known Allergies      Medication List     TAKE these medications    acetaminophen 325 MG tablet Commonly known as: TYLENOL Take 2 tablets (650 mg total) by mouth every 6 (six) hours as needed for mild pain (pain score 1-3) or headache (or Fever >/= 101).   ferrous sulfate 325 (65 FE) MG EC tablet Take 1 tablet (325 mg total) by mouth daily with breakfast.   hydrocortisone 25 MG suppository Commonly known as: ANUSOL-HC Place 1 suppository (25 mg total) rectally every 12 (twelve) hours.   lactulose 10 GM/15ML solution Commonly known as: CHRONULAC TAKE TWO TABLESPOONFUL (30 ML) BY MOUTH THREE TIMES DAILY What changed: See the new instructions.   pantoprazole 40 MG tablet Commonly known as: Protonix Take 1  tablet (40 mg total) by mouth daily.   torsemide 20 MG tablet Commonly known as: DEMADEX TAKE 1 TABLET BY MOUTH DAILY TAKE 1/2 TABLE WHEN PATIENT HAS EXTRA SWELLING What changed: See the new instructions.       Major procedures and Radiology Reports - PLEASE review detailed and final reports for all details, in brief -   CT ABDOMEN PELVIS W CONTRAST  Result Date: 03/16/2023 CLINICAL DATA:  Left lower quadrant abdominal  pain and rectal bleeding. EXAM: CT ABDOMEN AND PELVIS WITH CONTRAST TECHNIQUE: Multidetector CT imaging of the abdomen and pelvis was performed using the standard protocol following bolus administration of intravenous contrast. RADIATION DOSE REDUCTION: This exam was performed according to the departmental dose-optimization program which includes automated exposure control, adjustment of the mA and/or kV according to patient size and/or use of iterative reconstruction technique. CONTRAST:  OMNIPAQUE IOHEXOL 300 MG/ML  SOLN COMPARISON:  CT scan 07/10/2022 FINDINGS: Lower chest: Stable borderline cardiac enlargement. No pericardial effusion. No pulmonary infiltrates or pleural effusion. Hepatobiliary: Advanced cirrhotic changes involving the liver as seen on prior imaging studies. Portal venous hypertension and extensive portal venous collaterals again demonstrated. The portal vein thrombus has resolved. Persistent 3.2 cm mass associated with the right hepatic lobe worrisome for HCC. Stable distended gallbladder with mild wall thickening and calcified gallstones. No common bile duct dilatation. Pancreas: No mass, inflammation or ductal dilatation. Spleen: Normal size.  No focal lesions. Adrenals/Urinary Tract: Adrenal glands and kidneys are unremarkable. Stable small renal calculi. The bladder is unremarkable. Stomach/Bowel: The stomach, duodenum, small and colon are grossly no. No acute inflammatory process or obstructive findings. Stable moderate-sized periumbilical abdominal  hernia containing bowel. No findings for incarceration or obstruction. Vascular/Lymphatic: The aorta and branch vessels are patent. The major venous structures are patent. Stable appearing extensive portal venous collaterals. Stable scattered upper abdominal lymph nodes typical with cirrhosis. Diffuse mesenteric edema without overt ascites. Reproductive: The prostate gland and seminal vesicles are unremarkable. Other: No abdominal wall hernia or abnormality. No abdominopelvic ascites. Musculoskeletal: No significant bony findings. IMPRESSION: 1. Advanced cirrhotic changes involving the liver with portal venous hypertension, extensive portal venous collaterals and diffuse mesenteric edema without overt ascites. 2. Persistent 3.2 cm mass associated with the right hepatic lobe worrisome for HCC. 3. Stable distended gallbladder with mild wall thickening and calcified gallstones. 4. Stable moderate-sized periumbilical abdominal hernia containing bowel. No findings for incarceration or obstruction. 5. Stable small bilateral renal calculi. Electronically Signed   By: Rudie Meyer M.D.   On: 03/16/2023 10:10     Subjective    Kwaku Flint today has no new complaints  -Had small brown BM -Eating and drinking well -Wife at bedside, questions answered           Patient has been seen and examined prior to discharge   Objective   Blood pressure 122/78, pulse 66, temperature 97.8 F (36.6 C), resp. rate 17, height 6\' 2"  (1.88 m), weight (!) 136.5 kg, SpO2 100%.   Intake/Output Summary (Last 24 hours) at 03/17/2023 1235 Last data filed at 03/17/2023 0829 Gross per 24 hour  Intake 42.71 ml  Output --  Net 42.71 ml    Exam Gen:- Awake Alert, no acute distress , obese, in no acute distress HEENT:- [place].AT, No sclera icterus Neck-Supple Neck,No JVD,.  Lungs-  CTAB , good air movement bilaterally CV- S1, S2 normal, regular Abd-  +ve B.Sounds, Abd Soft, No significant tenderness,    Extremity/Skin:- No   edema,   good pulses Psych-affect is appropriate, oriented x3 Neuro-no new focal deficits, no tremors    Data Review   CBC w Diff:  Lab Results  Component Value Date   WBC 4.3 03/16/2023   HGB 11.2 (L) 03/17/2023   HGB 6.7 (L) 01/10/2016   HCT 36.2 (L) 03/17/2023   PLT 140 (L) 03/16/2023   LYMPHOPCT 21 03/16/2023   MONOPCT 15 03/16/2023   EOSPCT 4 03/16/2023   BASOPCT 1 03/16/2023  CMP:  Lab Results  Component Value Date   NA 135 03/17/2023   K 3.4 (L) 03/17/2023   CL 106 03/17/2023   CO2 22 03/17/2023   BUN 10 03/17/2023   CREATININE 0.88 03/17/2023   CREATININE 0.95 11/01/2021   PROT 6.3 (L) 03/17/2023   ALBUMIN 2.2 (L) 03/17/2023   BILITOT 4.3 (H) 03/17/2023   ALKPHOS 160 (H) 03/17/2023   AST 98 (H) 03/17/2023   ALT 37 03/17/2023  .  Total Discharge time is about 33 minutes  Shon Hale M.D on 03/17/2023 at 12:35 PM  Go to [email or web address] -  for contact info  [facility] - Office  [phone]

## 2023-03-17 NOTE — Progress Notes (Signed)
Reported very small, formed, brown bm this morning. Stated no blood.  Has not had much intake in last three days.

## 2023-09-17 ENCOUNTER — Encounter: Payer: Self-pay | Admitting: *Deleted

## 2023-10-27 ENCOUNTER — Inpatient Hospital Stay (HOSPITAL_COMMUNITY)
Admission: EM | Admit: 2023-10-27 | Discharge: 2023-10-30 | DRG: 603 | Disposition: A | Discharge status: Home / Self Care | Attending: Internal Medicine | Admitting: Internal Medicine

## 2023-10-27 ENCOUNTER — Encounter (HOSPITAL_COMMUNITY): Payer: Self-pay

## 2023-10-27 ENCOUNTER — Other Ambulatory Visit: Payer: Self-pay

## 2023-10-27 ENCOUNTER — Emergency Department (HOSPITAL_COMMUNITY)

## 2023-10-27 DIAGNOSIS — L03311 Cellulitis of abdominal wall: Secondary | ICD-10-CM | POA: Diagnosis present

## 2023-10-27 DIAGNOSIS — E876 Hypokalemia: Secondary | ICD-10-CM | POA: Diagnosis not present

## 2023-10-27 DIAGNOSIS — K7031 Alcoholic cirrhosis of liver with ascites: Secondary | ICD-10-CM | POA: Diagnosis present

## 2023-10-27 DIAGNOSIS — Z79899 Other long term (current) drug therapy: Secondary | ICD-10-CM | POA: Diagnosis not present

## 2023-10-27 DIAGNOSIS — F1010 Alcohol abuse, uncomplicated: Secondary | ICD-10-CM | POA: Diagnosis present

## 2023-10-27 DIAGNOSIS — Z87891 Personal history of nicotine dependence: Secondary | ICD-10-CM | POA: Diagnosis not present

## 2023-10-27 DIAGNOSIS — Z6839 Body mass index (BMI) 39.0-39.9, adult: Secondary | ICD-10-CM | POA: Diagnosis not present

## 2023-10-27 DIAGNOSIS — K7030 Alcoholic cirrhosis of liver without ascites: Secondary | ICD-10-CM | POA: Diagnosis not present

## 2023-10-27 DIAGNOSIS — F1090 Alcohol use, unspecified, uncomplicated: Secondary | ICD-10-CM | POA: Diagnosis not present

## 2023-10-27 DIAGNOSIS — D6959 Other secondary thrombocytopenia: Secondary | ICD-10-CM | POA: Diagnosis present

## 2023-10-27 DIAGNOSIS — K766 Portal hypertension: Secondary | ICD-10-CM | POA: Diagnosis present

## 2023-10-27 DIAGNOSIS — A419 Sepsis, unspecified organism: Secondary | ICD-10-CM

## 2023-10-27 DIAGNOSIS — K429 Umbilical hernia without obstruction or gangrene: Secondary | ICD-10-CM | POA: Diagnosis present

## 2023-10-27 DIAGNOSIS — I8510 Secondary esophageal varices without bleeding: Secondary | ICD-10-CM | POA: Diagnosis present

## 2023-10-27 DIAGNOSIS — R110 Nausea: Secondary | ICD-10-CM | POA: Diagnosis not present

## 2023-10-27 DIAGNOSIS — C220 Liver cell carcinoma: Secondary | ICD-10-CM | POA: Diagnosis present

## 2023-10-27 DIAGNOSIS — E872 Acidosis, unspecified: Secondary | ICD-10-CM | POA: Diagnosis present

## 2023-10-27 DIAGNOSIS — K7010 Alcoholic hepatitis without ascites: Secondary | ICD-10-CM | POA: Diagnosis present

## 2023-10-27 DIAGNOSIS — K420 Umbilical hernia with obstruction, without gangrene: Secondary | ICD-10-CM | POA: Diagnosis not present

## 2023-10-27 DIAGNOSIS — K3189 Other diseases of stomach and duodenum: Secondary | ICD-10-CM | POA: Diagnosis present

## 2023-10-27 DIAGNOSIS — M793 Panniculitis, unspecified: Secondary | ICD-10-CM | POA: Diagnosis present

## 2023-10-27 DIAGNOSIS — E66812 Obesity, class 2: Secondary | ICD-10-CM | POA: Diagnosis present

## 2023-10-27 DIAGNOSIS — Z8505 Personal history of malignant neoplasm of liver: Secondary | ICD-10-CM | POA: Diagnosis not present

## 2023-10-27 DIAGNOSIS — R188 Other ascites: Secondary | ICD-10-CM | POA: Diagnosis not present

## 2023-10-27 DIAGNOSIS — J90 Pleural effusion, not elsewhere classified: Secondary | ICD-10-CM | POA: Insufficient documentation

## 2023-10-27 DIAGNOSIS — J948 Other specified pleural conditions: Secondary | ICD-10-CM | POA: Diagnosis present

## 2023-10-27 DIAGNOSIS — K7460 Unspecified cirrhosis of liver: Secondary | ICD-10-CM | POA: Diagnosis not present

## 2023-10-27 LAB — COMPREHENSIVE METABOLIC PANEL WITH GFR
ALT: 34 U/L (ref 0–44)
AST: 91 U/L — ABNORMAL HIGH (ref 15–41)
Albumin: 2.4 g/dL — ABNORMAL LOW (ref 3.5–5.0)
Alkaline Phosphatase: 235 U/L — ABNORMAL HIGH (ref 38–126)
Anion gap: 11 (ref 5–15)
BUN: 17 mg/dL (ref 6–20)
CO2: 23 mmol/L (ref 22–32)
Calcium: 8.4 mg/dL — ABNORMAL LOW (ref 8.9–10.3)
Chloride: 105 mmol/L (ref 98–111)
Creatinine, Ser: 0.83 mg/dL (ref 0.61–1.24)
GFR, Estimated: >60 mL/min (ref >60)
Glucose, Bld: 88 mg/dL (ref 70–99)
Potassium: 3.9 mmol/L (ref 3.5–5.1)
Sodium: 139 mmol/L (ref 135–145)
Total Bilirubin: 5.2 mg/dL — ABNORMAL HIGH (ref 0.0–1.2)
Total Protein: 6.9 g/dL (ref 6.5–8.1)

## 2023-10-27 LAB — CBC
HCT: 40.6 % (ref 39.0–52.0)
Hemoglobin: 13.2 g/dL (ref 13.0–17.0)
MCH: 30.8 pg (ref 26.0–34.0)
MCHC: 32.5 g/dL (ref 30.0–36.0)
MCV: 94.6 fL (ref 80.0–100.0)
Platelets: 115 10*3/uL — ABNORMAL LOW (ref 150–400)
RBC: 4.29 MIL/uL (ref 4.22–5.81)
RDW: 20.4 % — ABNORMAL HIGH (ref 11.5–15.5)
WBC: 9.1 10*3/uL (ref 4.0–10.5)
nRBC: 0 % (ref 0.0–0.2)

## 2023-10-27 LAB — PROTIME-INR
INR: 1.4 — ABNORMAL HIGH (ref 0.8–1.2)
Prothrombin Time: 16.9 s — ABNORMAL HIGH (ref 11.4–15.2)

## 2023-10-27 LAB — LACTIC ACID, PLASMA
Lactic Acid, Venous: 2.2 mmol/L (ref 0.5–1.9)
Lactic Acid, Venous: 2.3 mmol/L (ref 0.5–1.9)

## 2023-10-27 LAB — LIPASE, BLOOD: Lipase: 41 U/L (ref 11–51)

## 2023-10-27 MED ORDER — FENTANYL CITRATE PF 50 MCG/ML IJ SOSY
50.0000 ug | PREFILLED_SYRINGE | Freq: Once | INTRAMUSCULAR | Status: AC
  Administered 2023-10-27: 50 ug via INTRAVENOUS
  Filled 2023-10-27: qty 1

## 2023-10-27 MED ORDER — HYDROMORPHONE HCL 1 MG/ML IJ SOLN
0.5000 mg | INTRAMUSCULAR | Status: DC | PRN
  Administered 2023-10-28 – 2023-10-29 (×2): 0.5 mg via INTRAVENOUS
  Filled 2023-10-27 (×2): qty 0.5

## 2023-10-27 MED ORDER — IOHEXOL 300 MG/ML  SOLN
100.0000 mL | Freq: Once | INTRAMUSCULAR | Status: AC | PRN
  Administered 2023-10-27: 100 mL via INTRAVENOUS

## 2023-10-27 MED ORDER — ONDANSETRON HCL 4 MG/2ML IJ SOLN
4.0000 mg | Freq: Once | INTRAMUSCULAR | Status: AC
  Administered 2023-10-27: 4 mg via INTRAVENOUS
  Filled 2023-10-27: qty 2

## 2023-10-27 MED ORDER — FUROSEMIDE 10 MG/ML IJ SOLN
40.0000 mg | Freq: Once | INTRAMUSCULAR | Status: AC
  Administered 2023-10-27: 40 mg via INTRAVENOUS
  Filled 2023-10-27: qty 4

## 2023-10-27 MED ORDER — ONDANSETRON HCL 4 MG/2ML IJ SOLN: 4.0000 mg | Freq: Four times a day (QID) | INTRAMUSCULAR | Status: DC | PRN

## 2023-10-27 MED ORDER — FENTANYL CITRATE PF 50 MCG/ML IJ SOSY
25.0000 ug | PREFILLED_SYRINGE | Freq: Once | INTRAMUSCULAR | Status: AC
  Administered 2023-10-27: 25 ug via INTRAVENOUS
  Filled 2023-10-27: qty 1

## 2023-10-27 MED ORDER — LINEZOLID 600 MG/300ML IV SOLN
600.0000 mg | Freq: Two times a day (BID) | INTRAVENOUS | Status: DC
  Administered 2023-10-27 – 2023-10-29 (×5): 600 mg via INTRAVENOUS
  Filled 2023-10-27 (×9): qty 300

## 2023-10-27 MED ORDER — ONDANSETRON HCL 4 MG PO TABS: 4.0000 mg | ORAL_TABLET | Freq: Four times a day (QID) | ORAL | Status: DC | PRN

## 2023-10-27 MED ORDER — SODIUM CHLORIDE 0.9 % IV BOLUS
500.0000 mL | Freq: Once | INTRAVENOUS | Status: AC
  Administered 2023-10-27: 500 mL via INTRAVENOUS

## 2023-10-27 MED ORDER — SODIUM CHLORIDE 0.9 % IV SOLN
1.0000 g | Freq: Once | INTRAVENOUS | Status: AC
  Administered 2023-10-27: 1 g via INTRAVENOUS
  Filled 2023-10-27: qty 10

## 2023-10-27 NOTE — ED Triage Notes (Signed)
 Pt c/o pain since last night at hernia. Pt points to umbilical area.

## 2023-10-27 NOTE — Sepsis Progress Note (Addendum)
 Elink following code sepsis.  Lactic acid obtained at 1735. Result was 2.2. Antibiotics were given at 2007. Blood cultures and code sepsis ordered at 2107. Will continue to monitor code sepsis.

## 2023-10-27 NOTE — ED Notes (Signed)
Pt given an ice pack  °

## 2023-10-27 NOTE — H&P (Signed)
 History and Physical    Patient: Antonio [name] AOZ:[id number] DOB: 1970/12/13 DOA: 10/27/2023 DOS: the patient was seen and examined on 10/27/2023 PCP: Theoplis Fix, MD  Patient coming from: Home  Chief Complaint:  Chief Complaint  Patient presents with   Abdominal Pain   HPI: Antonio [name] is a 53 y.o. 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 pylori gastritis eradicated by documentation in 2023, hepatocellular carcinoma and history of gout with prior endoluminal evaluation showing-:: --Colonoscopy 08/21/2021 with hemorrhoids, one 2 mm polyp s/p removal ( benign aggregates).   EGD 08/21/2021: Grade 1 esophageal varices, gastritis s/p biopsy, portal gastropathy, duodenitis Patient presents with 1 day onset of abdominal pain around the bellybutton where he has hernia, pain was associated with nausea without vomiting.  He denies fever, chills, chest pain, shortness of breath.  ED Course:  In the emergency department, he was tachycardic, BP was 170/109, other vital signs were within normal range.  Workup in the ED showed normal CBC and BMP except for albumin  2.4, AST 91, ALT 34, ALP 235, total bilirubin 5.2.  Lactic acid 2.2 > 2.3. CT abdomen and pelvis with contrast showed moderate periumbilical hernia containing fat and nondilated loops of small bowel, similar to prior, without inflammatory changes. Mild subcutaneous stranding along the anterior abdominal wall, suggesting panniculitis/cellulitis. He was treated with IV ceftriaxone , fentanyl  was given due to pain, IV Lasix  40 mg x 1 was given.  Zofran  was given due to nausea and IV hydration was provided.  TRH was asked to admit patient.  Review of Systems: Review of systems as noted in the HPI. All other systems reviewed and are negative.   Past Medical History:  Diagnosis Date   Alcohol abuse     Alcoholic hepatitis    Cirrhosis with alcoholism (HCC)    History of gout    Past Surgical History:  Procedure Laterality Date   BIOPSY  03/08/2016   Procedure: BIOPSY;  Surgeon: Suzette Espy, MD;  [facility];  Service: Endoscopy;;  descending colon biopsies   BIOPSY  04/14/2019   Procedure: BIOPSY;  Surgeon: Alyce Jubilee, MD;  [facility];  Service: Endoscopy;;   BIOPSY  08/21/2021   Procedure: BIOPSY;  Surgeon: Vinetta Greening, DO;  [facility]: [facility];  Service: Endoscopy;;   Colonoscopy     per patient around 2014 in [place]    COLONOSCOPY WITH PROPOFOL  [name] 03/08/2016   One 8 mm polyp in the descending colon, removed with a hot snare. Resected and Retrieved.One 4 mm polyp in the cecum, removed with a cold snare. Resected and retrieved. Rectal varices. Melanosis coli. Abnormal left colon mucosa status post biopsy. Tubular adenomas. Surveillance was due 2022.   COLONOSCOPY WITH PROPOFOL  [name] 08/21/2021   Procedure: COLONOSCOPY WITH PROPOFOL ;  Surgeon: Vinetta Greening, DO;  [facility]: [facility];  Service: Endoscopy;  Laterality: [name];  11:15am   ESOPHAGOGASTRODUODENOSCOPY (EGD) WITH PROPOFOL  [name] 12/28/2015   Dr. Nolene Baumgarten: 4 columns of esophageal varices, 3 grade 1, 1 grade 2 with no evidence of bleeding. Mild portal gastropathy with no evidence of active bleeding.   ESOPHAGOGASTRODUODENOSCOPY (EGD) WITH PROPOFOL  [name] 03/08/2016   Procedure: ESOPHAGOGASTRODUODENOSCOPY (EGD) WITH PROPOFOL ;  Surgeon: Suzette Espy, MD;  [facility];  Service: Endoscopy;  Laterality: [name];   ESOPHAGOGASTRODUODENOSCOPY (EGD) WITH PROPOFOL  [name] 04/14/2019   Grade 1 and 2  esophageal varices without bleeding and no stigmata of bleeding. moderate portal gastropath, mild erosive gastritis. Path with positive H.pylori   ESOPHAGOGASTRODUODENOSCOPY (EGD) WITH PROPOFOL  [name] 08/21/2021   Procedure: ESOPHAGOGASTRODUODENOSCOPY (EGD) WITH PROPOFOL ;  Surgeon: Vinetta Greening, DO;   [facility];  Service: Endoscopy;  Laterality: [name];   GIVENS CAPSULE STUDY [name] 07/05/2017   Procedure: GIVENS CAPSULE STUDY;  Surgeon: Alyce Jubilee, MD;  [facility];  Service: Endoscopy;  Laterality: [name];  7:30am   GIVENS CAPSULE STUDY [name] 08/05/2017   Procedure: GIVENS CAPSULE STUDY;  Surgeon: Alyce Jubilee, MD;  [facility];  Service: Endoscopy;  Laterality: [name];  7:30am   ORIF ANKLE FRACTURE Right 06/02/2021   Procedure: OPEN REDUCTION INTERNAL FIXATION (ORIF) ANKLE FRACTURE;  Surgeon: Darrin Emerald, MD;  Location: [facility];  Service: Orthopedics;  Laterality: Right;   POLYPECTOMY  03/08/2016   Procedure: POLYPECTOMY;  Surgeon: Suzette Espy, MD;  [facility];  Service: Endoscopy;;  cecum and descending    POLYPECTOMY  08/21/2021   Procedure: POLYPECTOMY INTESTINAL;  Surgeon: Vinetta Greening, DO;  [facility]: [facility];  Service: Endoscopy;;    Social History:  reports that he quit smoking about 9 years ago. His smoking use included cigarettes. He started smoking about 17 years ago. He has a 2 pack-year smoking history. He has been exposed to tobacco smoke. He has never used smokeless tobacco. He reports that he does not drink alcohol and does not use drugs.   No Known Allergies  Family History  Problem Relation Age of Onset   Colon cancer Neg Hx    Liver disease Neg Hx      Prior to Admission medications   Medication Sig Start Date End Date Taking? Authorizing Provider  acetaminophen  (TYLENOL ) 325 MG tablet Take 2 tablets (650 mg total) by mouth every 6 (six) hours as needed for mild pain (pain score 1-3) or headache (or Fever >/= 101). 03/17/23   Colin Dawley, MD  ferrous sulfate  325 (65 FE) MG EC tablet Take 1 tablet (325 mg total) by mouth daily with breakfast. 03/17/23   Colin Dawley, MD  hydrocortisone  (ANUSOL -HC) 25 MG suppository Place 1 suppository (25 mg total) rectally every 12 (twelve) hours. 03/17/23  03/16/24  Colin Dawley, MD  lactulose  (CHRONULAC ) 10 GM/15ML solution TAKE TWO TABLESPOONFUL (30 ML) BY MOUTH THREE TIMES DAILY Patient taking differently: Take 10 g by mouth as needed for moderate constipation. 03/01/21   Lanney Pitts, PA-C  pantoprazole  (PROTONIX ) 40 MG tablet Take 1 tablet (40 mg total) by mouth daily. 03/17/23 03/16/24  Colin Dawley, MD  torsemide  (DEMADEX ) 20 MG tablet TAKE 1 TABLET BY MOUTH DAILY TAKE 1/2 TABLE WHEN PATIENT HAS EXTRA SWELLING Patient taking differently: Take 10-20 mg by mouth as needed (fluid). 05/12/19   Delman Ferns, NP    Physical Exam: BP 120/77   Pulse (!) 105   Temp 99 F (37.2 C) (Oral)   Resp 20   Ht 6\' 2"  (1.88 m)   Wt 133.8 kg   SpO2 95%   BMI 37.88 kg/m   General: 53 y.o. year-old male well developed well nourished in no acute distress.  Alert and oriented x3. HEENT: NCAT, EOMI Neck: Supple, trachea medial Cardiovascular: Regular rate and rhythm with no rubs or gallops.  No thyromegaly or JVD noted.  No lower extremity edema. 2/4 pulses in all 4 extremities. Respiratory: Clear to auscultation with no wheezes or rales. Good  inspiratory effort. Abdomen: Soft, tender to palpation without guarding.  Umbilical hernia reducible with pressure applied, but quickly prolapses once pressure is released.  Abdominal wall inferior to umbilicus was erythematous and tender to touch.  Nondistended with normal bowel sounds x4 quadrants. Muskuloskeletal: No cyanosis, clubbing or edema noted bilaterally Neuro: CN II-XII intact, strength 5/5 x 4, sensation, reflexes intact Skin: No ulcerative lesions noted or rashes Psychiatry: Judgement and insight appear normal. Mood is appropriate for condition and setting          Labs on Admission:  Basic Metabolic Panel: Recent Labs  Lab 10/27/23 1735  NA 139  K 3.9  CL 105  CO2 23  GLUCOSE 88  BUN 17  CREATININE 0.83  CALCIUM 8.4*   Liver Function Tests: Recent Labs  Lab 10/27/23 1735   AST 91*  ALT 34  ALKPHOS 235*  BILITOT 5.2*  PROT 6.9  ALBUMIN  2.4*   Recent Labs  Lab 10/27/23 1735  LIPASE 41   No results for input(s): "AMMONIA" in the last 168 hours. CBC: Recent Labs  Lab 10/27/23 1735  WBC 9.1  HGB 13.2  HCT 40.6  MCV 94.6  PLT 115*   Cardiac Enzymes: No results for input(s): "CKTOTAL", "CKMB", "CKMBINDEX", "TROPONINI" in the last 168 hours.  BNP (last 3 results) No results for input(s): "BNP" in the last 8760 hours.  ProBNP (last 3 results) No results for input(s): "PROBNP" in the last 8760 hours.  CBG: No results for input(s): "GLUCAP" in the last 168 hours.  Radiological Exams on Admission: CT ABDOMEN PELVIS W CONTRAST Result Date: 10/27/2023 EXAM: CT ABDOMEN AND PELVIS WITH CONTRAST 10/27/2023 06:57:38 PM TECHNIQUE: CT of the abdomen and pelvis was performed with the administration of intravenous contrast. Multiplanar reformatted images are provided for review. Automated exposure control, iterative reconstruction, and/or weight based adjustment of the mA/kV was utilized to reduce the radiation dose to as low as reasonably achievable. COMPARISON: 03/16/2023 CLINICAL HISTORY: Umbilical hernia pain, panniculitis. Complains of pain since last night at hernia. Patient points to umbilical area. FINDINGS: LOWER CHEST: Moderate to large right pleural effusion with right lower lobe atelectasis, new. LIVER: Cirrhosis. 3.0 cm subcapsular lesion in segment 5, worrisome for HCC, but unchanged. Additional subcentimeter lesions in segment 3 and inferiorly in segment 6, indeterminate. GALLBLADDER AND BILE DUCTS: Mildly thick-walled gallbladder with layering small gallstone, unchanged. No biliary ductal dilatation. SPLEEN: No acute abnormality. PANCREAS: No acute abnormality. ADRENAL GLANDS: No acute abnormality. KIDNEYS, URETERS AND BLADDER: No stones in the kidneys or ureters. No hydronephrosis. No perinephric or periureteral stranding. Urinary bladder is  unremarkable. GI AND BOWEL: Stomach demonstrates no acute abnormality. There is no bowel obstruction. No appendicitis. Small fat/fluid-containing supraumbilical hernia, similar to prior, although now with increased fluid. Moderate periumbilical hernia containing fat and nondilated loops of small bowel, similar to the prior, without inflammatory changes. PERITONEUM AND RETROPERITONEUM: Small volume abdominopelvic ascites, new. No free air. VASCULATURE: Portal vein is patent. Perigastric varices with splenic shunt. LYMPH NODES: No lymphadenopathy. REPRODUCTIVE ORGANS: No acute abnormality. BONES AND SOFT TISSUES: Mild degenerative changes of the lumbar spine. Mild body wall edema. Mild subcutaneous stranding along the anterior abdominal wall, suggesting panniculitis/cellulitis. IMPRESSION: 1. Moderate periumbilical hernia containing fat and nondilated loops of small bowel, similar to prior, without inflammatory changes. 2. Mild subcutaneous stranding along the anterior abdominal wall, suggesting panniculitis/cellulitis. 3. Cirrhosis with a 3.0 cm subcapsular lesion in segment 5, worrisome for HCC, unchanged. Additional subcentimeter lesions in segment 3 and inferiorly in  segment 6, indeterminate. 4. Moderate to large right pleural effusion with right lower lobe atelectasis, new. Small volume abdominopelvic ascites, new. Electronically signed by: Zadie Herter MD 10/27/2023 07:39 PM EDT RP Workstation: [id number]    EKG: I independently viewed the EKG done and my findings are as followed: EKG was not done in the ED  Assessment/Plan Present on Admission:  Abdominal wall cellulitis  ALC (alcoholic liver cirrhosis) (HCC)  HCC (hepatocellular carcinoma) (HCC)  Principal Problem:   Abdominal wall cellulitis Active Problems:   ALC (alcoholic liver cirrhosis) (HCC)   HCC (hepatocellular carcinoma) (HCC)   Nausea   Pleural effusion on right   Obesity, Class II, BMI 35-39.9   Umbilical hernia  Abdominal  wall cellulitis/panniculitis He was started on IV ceftriaxone , we shall continue with Zyvox at this time Continue IV Dilaudid  0.5 mg every 4 hours as needed  Nausea Patient presents with nausea without vomiting in the setting of above Continue Zofran  as needed  Umbilical hernia General Surgery will be consulted for definite reduction/repair  Right pleural effusion CT abdomen and pelvis with contrast showed moderate  to large right pleural effusion with right lower lobe atelectasis, new IR will be consulted for thoracentesis in the morning  History of 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 Cheree Cords previously -patient may follow-up as an outpatient  Obesity class II (BMI 37.88) Diet and lifestyle modification   DVT prophylaxis: SCDs  Code Status: Full code  Family Communication: None at bedside  Consults: Gastroenterology  Severity of Illness: The appropriate patient status for this patient is INPATIENT. Inpatient status is judged to be reasonable and necessary in order to provide the required intensity of service to ensure the patient's safety. The patient's presenting symptoms, physical exam findings, and initial radiographic and laboratory data in the context of their chronic comorbidities is felt to place them at high risk for further clinical deterioration. Furthermore, it is not anticipated that the patient will be medically stable for discharge from the hospital within 2 midnights of admission.   * I certify that at the point of admission it is my clinical judgment that the patient will require inpatient hospital care spanning beyond 2 midnights from the point of admission due to high intensity of service, high risk for further deterioration and high frequency of  surveillance required.*  Author: Jenelle Drennon, DO 10/27/2023 11:36 PM  For on call review [email or web address].

## 2023-10-27 NOTE — ED Notes (Signed)
 Pt ambulated with this RN around nurses' station and pt's sats went down to 88% on RA, HR up to 133.

## 2023-10-27 NOTE — ED Provider Notes (Signed)
 [place] EMERGENCY DEPARTMENT AT [facility] Provider Note   CSN: [id number] Arrival date & time: 10/27/23  1191     History  Chief Complaint  Patient presents with   Abdominal Pain    Antonio [name] is a 53 y.o. male.   Abdominal Pain   53 year old male presents emergency department with complaints of abdominal pain.  States his symptoms began yesterday evening and has persisted through today.  Reports feelings of nausea with no emesis.  States that he feels like most of his pain is coming from his hernia near his bellybutton.  States that his last bowel movement was this morning and regular per patient.  States he does have general surgeon's name but was unable to follow-up in the past due to losing insurance but currently does have insurance.  Denies any fevers, chills, urinary symptoms.  Patient states that his had this hernia for years but has not caused him very many problems.  Past medical history significant for alcohol abuse, alcoholic hepatitis, cirrhosis, seizure, hepatic encephalopathy, hepatocellular carcinoma, iron  deficiency anemia, CHF  Home Medications Prior to Admission medications   Medication Sig Start Date End Date Taking? Authorizing Provider  acetaminophen  (TYLENOL ) 325 MG tablet Take 2 tablets (650 mg total) by mouth every 6 (six) hours as needed for mild pain (pain score 1-3) or headache (or Fever >/= 101). 03/17/23   Colin Dawley, MD  ferrous sulfate  325 (65 FE) MG EC tablet Take 1 tablet (325 mg total) by mouth daily with breakfast. 03/17/23   Colin Dawley, MD  hydrocortisone  (ANUSOL -HC) 25 MG suppository Place 1 suppository (25 mg total) rectally every 12 (twelve) hours. 03/17/23 03/16/24  Colin Dawley, MD  lactulose  (CHRONULAC ) 10 GM/15ML solution TAKE TWO TABLESPOONFUL (30 ML) BY MOUTH THREE TIMES DAILY Patient taking differently: Take 10 g by mouth as needed for moderate constipation. 03/01/21   Lanney Pitts, PA-C  pantoprazole   (PROTONIX ) 40 MG tablet Take 1 tablet (40 mg total) by mouth daily. 03/17/23 03/16/24  Colin Dawley, MD  torsemide  (DEMADEX ) 20 MG tablet TAKE 1 TABLET BY MOUTH DAILY TAKE 1/2 TABLE WHEN PATIENT HAS EXTRA SWELLING Patient taking differently: Take 10-20 mg by mouth as needed (fluid). 05/12/19   Delman Ferns, NP      Allergies    Patient has no known allergies.    Review of Systems   Review of Systems  Gastrointestinal:  Positive for abdominal pain.  All other systems reviewed and are negative.   Physical Exam Updated Vital Signs BP (!) 173/88 (BP Location: Right Arm)   Pulse (!) 118   Temp 98.3 F (36.8 C) (Oral)   Resp 19   Ht 6\' 2"  (1.88 m)   Wt 133.8 kg   SpO2 96%   BMI 37.88 kg/m  Physical Exam Vitals and nursing note reviewed.  Constitutional:      General: He is not in acute distress.    Appearance: He is well-developed.  HENT:     Head: Normocephalic and atraumatic.  Eyes:     Conjunctiva/sclera: Conjunctivae normal.     Comments: Faint scleral icterus appreciated.  Cardiovascular:     Rate and Rhythm: Normal rate and regular rhythm.     Heart sounds: No murmur heard. Pulmonary:     Effort: Pulmonary effort is normal. No respiratory distress.     Breath sounds: Normal breath sounds.  Abdominal:     Palpations: Abdomen is soft.     Tenderness: There is abdominal tenderness.  Comments: Patient with hernia present umbilicus measuring 5 to 5.5 cm in diameter.  Area is soft with no overlying erythema, ecchymosis present.  Able to reduce hernia with gentle prolonged pressure but hernia quickly prolapses whenever pressure is not applied.  Patient with erythema, induration to lower third of pannus.  No obvious palpable fluctuance.  Area warm to the touch as well as tender.  No obvious breaks in skin appreciated.  Musculoskeletal:        General: No swelling.     Cervical back: Neck supple.  Skin:    General: Skin is warm and dry.     Capillary Refill:  Capillary refill takes less than 2 seconds.  Neurological:     Mental Status: He is alert.  Psychiatric:        Mood and Affect: Mood normal.     ED Results / Procedures / Treatments   Labs (all labs ordered are listed, but only abnormal results are displayed) Labs Reviewed  COMPREHENSIVE METABOLIC PANEL WITH GFR  CBC  LIPASE, BLOOD    EKG None  Radiology No results found.  Procedures Procedures    Medications Ordered in ED Medications - No data to display  ED Course/ Medical Decision Making/ A&P                                 Medical Decision Making Amount and/or Complexity of Data Reviewed Labs: ordered. Radiology: ordered.  Risk Prescription drug management.   This patient presents to the ED for concern of abdominal pain, this involves an extensive number of treatment options, and is a complaint that carries with it a high risk of complications and morbidity.  The differential diagnosis includes incarcerated, strangulated hernia, SBO/LBO, volvulus, diverticulitis, appendicitis, panniculitis, abscess, other   Co morbidities that complicate the patient evaluation  See HPI   Additional history obtained:  Additional history obtained from EMR External records from outside source obtained and reviewed including hospital records   Lab Tests:  I Ordered, and personally interpreted labs.  The pertinent results include: No leukocytosis.  No evidence of anemia.  Thrombocytopenia 115 of which is near patient's baseline mild hypocalcemia of 8.8; otherwise electrolytes within normal limits.  Lateralization of AST of 91, alkaline phosphatase of 235, total bili of 5.2 which seems to be slightly increasing baseline.  Lactic acid of 2.2.   Imaging Studies ordered:  I ordered imaging studies including CT abdomen pelvis which is pending upon shift change.  Cardiac Monitoring: / EKG:  The patient was maintained on a cardiac monitor.  I personally viewed and  interpreted the cardiac monitored which showed an underlying rhythm of: Rhythm   Consultations Obtained:  I requested consultation with attending Dr. Scarlette Currier is reviewing treatment plan going forward   Problem List / ED Course / Critical interventions / Medication management  Abdominal pain, Umbilical hernia, panniculitis I ordered medication including Zofran , fentanyl    Reevaluation of the patient after these medicines showed that the patient improved I have reviewed the patients home medicines and have made adjustments as needed   Social Determinants of Health:  Chronic alcohol abuse.  Former cigarette use.  Denies illicit substance use.   Test / Admission - Considered:  Abdominal pain, Umbilical hernia, panniculitis, elevated bilirubin Vitals signs significant for hypertension, tachycardia blood pressure 173/88 initially with heart rate of 118. Otherwise within normal range and stable throughout visit. Laboratory/imaging studies significant for: See above 53 year old  male presents emergency department with complaints of abdominal pain.  States his symptoms began yesterday evening and has persisted through today.  Reports feelings of nausea with no emesis.  States that he feels like most of his pain is coming from his hernia near his bellybutton.  States that his last bowel movement was this morning and regular per patient.  States he does have general surgeon's name but was unable to follow-up in the past due to losing insurance but currently does have insurance.  Denies any fevers, chills, urinary symptoms.  Patient states that his had this hernia for years but has not caused him very many problems. On exam, cellulitic skin change appreciated lower third of pannus. Antonio [name]  Umbilical hernia present but able to be reduced on exam which easily becomes reapparent when pressure not applied.  Laboratory studies as above with increased total bilirubin from baseline of 5.2 around 3.  Pending CT  imaging at shift change.  Disposition pending.  At time of change, patient care handed off to Dr. Scarlette Currier.  Patient stable upon shift change.         Final Clinical Impression(s) / ED Diagnoses Final diagnoses:  None    Rx / DC Orders ED Discharge Orders     None         [place] Butter, [place] 10/27/23 1840    Sueellen Emery, MD 10/27/23 2334

## 2023-10-27 NOTE — Consult Note (Signed)
 CODE SEPSIS - PHARMACY COMMUNICATION  **Broad-spectrum antimicrobials should be administered within one hour of sepsis diagnosis**  Time Code Sepsis call or page was received: 2107  Antibiotics ordered: Ceftriaxone   Time of first antibiotic administration: 2007  Additional action taken by pharmacy: [name]  If necessary, name of provider/nurse contacted: [name]    Will M. Alva Jewels, PharmD Clinical Pharmacist 10/27/2023 9:28 PM

## 2023-10-28 ENCOUNTER — Inpatient Hospital Stay (HOSPITAL_COMMUNITY)

## 2023-10-28 ENCOUNTER — Encounter (HOSPITAL_COMMUNITY): Payer: Self-pay | Admitting: Internal Medicine

## 2023-10-28 DIAGNOSIS — K420 Umbilical hernia with obstruction, without gangrene: Secondary | ICD-10-CM

## 2023-10-28 DIAGNOSIS — K7031 Alcoholic cirrhosis of liver with ascites: Secondary | ICD-10-CM

## 2023-10-28 DIAGNOSIS — L03311 Cellulitis of abdominal wall: Secondary | ICD-10-CM | POA: Diagnosis not present

## 2023-10-28 DIAGNOSIS — K7030 Alcoholic cirrhosis of liver without ascites: Secondary | ICD-10-CM | POA: Diagnosis not present

## 2023-10-28 DIAGNOSIS — C220 Liver cell carcinoma: Secondary | ICD-10-CM | POA: Diagnosis not present

## 2023-10-28 DIAGNOSIS — J90 Pleural effusion, not elsewhere classified: Secondary | ICD-10-CM | POA: Diagnosis not present

## 2023-10-28 DIAGNOSIS — R188 Other ascites: Secondary | ICD-10-CM

## 2023-10-28 DIAGNOSIS — K7460 Unspecified cirrhosis of liver: Secondary | ICD-10-CM | POA: Diagnosis not present

## 2023-10-28 DIAGNOSIS — K429 Umbilical hernia without obstruction or gangrene: Secondary | ICD-10-CM | POA: Diagnosis not present

## 2023-10-28 LAB — CBC WITH DIFFERENTIAL/PLATELET
Abs Immature Granulocytes: 0.04 10*3/uL (ref 0.00–0.07)
Basophils Absolute: 0 10*3/uL (ref 0.0–0.1)
Basophils Relative: 1 %
Eosinophils Absolute: 0.2 10*3/uL (ref 0.0–0.5)
Eosinophils Relative: 2 %
HCT: 35.8 % — ABNORMAL LOW (ref 39.0–52.0)
Hemoglobin: 11.6 g/dL — ABNORMAL LOW (ref 13.0–17.0)
Immature Granulocytes: 1 %
Lymphocytes Relative: 9 %
Lymphs Abs: 0.6 10*3/uL — ABNORMAL LOW (ref 0.7–4.0)
MCH: 31.7 pg (ref 26.0–34.0)
MCHC: 32.4 g/dL (ref 30.0–36.0)
MCV: 97.8 fL (ref 80.0–100.0)
Monocytes Absolute: 0.6 10*3/uL (ref 0.1–1.0)
Monocytes Relative: 9 %
Neutro Abs: 5.9 10*3/uL (ref 1.7–7.7)
Neutrophils Relative %: 78 %
Platelets: 99 10*3/uL — ABNORMAL LOW (ref 150–400)
RBC: 3.66 MIL/uL — ABNORMAL LOW (ref 4.22–5.81)
RDW: 20.3 % — ABNORMAL HIGH (ref 11.5–15.5)
WBC: 7.4 10*3/uL (ref 4.0–10.5)
nRBC: 0 % (ref 0.0–0.2)

## 2023-10-28 LAB — PROTIME-INR
INR: 1.5 — ABNORMAL HIGH (ref 0.8–1.2)
Prothrombin Time: 17.9 s — ABNORMAL HIGH (ref 11.4–15.2)

## 2023-10-28 LAB — GRAM STAIN: Gram Stain: NONE SEEN

## 2023-10-28 LAB — BODY FLUID CELL COUNT WITH DIFFERENTIAL
Lymphs, Fluid: 25 %
Monocyte-Macrophage-Serous Fluid: 67 % (ref 50–90)
Neutrophil Count, Fluid: 8 % (ref 0–25)
Total Nucleated Cell Count, Fluid: 180 uL (ref 0–1000)

## 2023-10-28 LAB — COMPREHENSIVE METABOLIC PANEL WITH GFR
ALT: 29 U/L (ref 0–44)
AST: 66 U/L — ABNORMAL HIGH (ref 15–41)
Albumin: 2 g/dL — ABNORMAL LOW (ref 3.5–5.0)
Alkaline Phosphatase: 173 U/L — ABNORMAL HIGH (ref 38–126)
Anion gap: 8 (ref 5–15)
BUN: 17 mg/dL (ref 6–20)
CO2: 25 mmol/L (ref 22–32)
Calcium: 8 mg/dL — ABNORMAL LOW (ref 8.9–10.3)
Chloride: 105 mmol/L (ref 98–111)
Creatinine, Ser: 1.04 mg/dL (ref 0.61–1.24)
GFR, Estimated: >60 mL/min (ref >60)
Glucose, Bld: 163 mg/dL — ABNORMAL HIGH (ref 70–99)
Potassium: 3.6 mmol/L (ref 3.5–5.1)
Sodium: 138 mmol/L (ref 135–145)
Total Bilirubin: 4.1 mg/dL — ABNORMAL HIGH (ref 0.0–1.2)
Total Protein: 6 g/dL — ABNORMAL LOW (ref 6.5–8.1)

## 2023-10-28 LAB — PROTEIN, PLEURAL OR PERITONEAL FLUID: Total protein, fluid: <3 g/dL

## 2023-10-28 LAB — LACTATE DEHYDROGENASE, PLEURAL OR PERITONEAL FLUID: LD, Fluid: 31 U/L — ABNORMAL HIGH (ref 3–23)

## 2023-10-28 LAB — GLUCOSE, PLEURAL OR PERITONEAL FLUID: Glucose, Fluid: 111 mg/dL

## 2023-10-28 LAB — LACTATE DEHYDROGENASE: LDH: 239 U/L — ABNORMAL HIGH (ref 98–192)

## 2023-10-28 MED ORDER — LIDOCAINE HCL (PF) 2 % IJ SOLN
10.0000 mL | Freq: Once | INTRAMUSCULAR | Status: AC
  Administered 2023-10-28: 10 mL

## 2023-10-28 MED ORDER — LIDOCAINE HCL (PF) 2 % IJ SOLN
INTRAMUSCULAR | Status: AC
Start: 2023-10-28 — End: ?
  Filled 2023-10-28: qty 10

## 2023-10-28 MED ORDER — POLYETHYLENE GLYCOL 3350 17 G PO PACK
17.0000 g | PACK | Freq: Every day | ORAL | Status: DC
  Administered 2023-10-28 – 2023-10-30 (×3): 17 g via ORAL
  Filled 2023-10-28 (×3): qty 1

## 2023-10-28 MED ORDER — SENNOSIDES-DOCUSATE SODIUM 8.6-50 MG PO TABS
1.0000 | ORAL_TABLET | Freq: Two times a day (BID) | ORAL | Status: DC
  Administered 2023-10-28 – 2023-10-30 (×4): 1 via ORAL
  Filled 2023-10-28 (×4): qty 1

## 2023-10-28 NOTE — Consult Note (Signed)
 Gastroenterology Consult   Referring Provider: Dr. Elyse Hand Primary Care Physician:  Theoplis Fix, MD Primary Gastroenterologist:  Dr. Mordechai April   Patient ID: Antonio [name]; [id number]; 10/12/1970   Admit date: 10/27/2023  LOS: 1 day   Date of Consultation: 10/28/2023  Reason for Consultation:  Cirrhosis, HCC  History of Present Illness   Antonio [name] is a 53 y.o. year old male history of cirrhosis due to ETOH, IDA, esophageal varices, H.pylori gastritis in 2020 with documented eradication, tubular adenomas , chronic GERD, remote history of encephalopathy, elevated AFP in past with liver lesions (HCC) on MRI in 2023, not a resection candidate given portal hypertension and did not want to pursue liver transplant in 2023. He was evaluated at [facility] and underwent MWA Sept 2023 with recommended repeat CT or MRI in Dec 2023. He was lost to follow-up at [facility] and actually seen inpatient Oct 2024 by [facility] for rectal bleeding but was lost to follow-up thereafter as well. He has been admitted for abdominal wall cellulitis, panniculitis, right pleural effusion, and GI consulted due to history of cirrhosis and HCC.   Pertinent labs in ED: Tbili 5.2, Alk phos 235, AST 91, albumin  2.4. Lactic acid 2.3, INR 1.4, preliminary blood cultures no growth thus far.   He underwent thoracentesis with 2.4 liters removed. Cell count negative. Additional labs pending.   CT October 27, 2023 at time of ED presentation: subcutaneous stranding along anterior abdominal wall suggesting panniculitis/cellulitis, 3.0 cm liver lesion in segment 5 unchanged, additional subcentimeter lesions indeterminate. Moderate to large right pleural effusion with right lower lobe atelectasis, small volume abdominal ascites.    Today: Presenting to the ED with worsening abdominal pain located at umbilicus and below umbilicus for about 1-2 weeks, progressing in intensity. He notes umbilical hernia bulging. Feels like constant discomfort. No fever or  chills. Denies any hematochezia or melena. Nausea yesterday but no vomiting. No nausea today. No constipation or diarrhea. Continues to abstain from ETOH for past 7 years. No dysphagia. No GERD. Takes pantoprazole  daily. He is on torsemide  10-20 mg daily. Does not appear he is on a non-selective beta blocker. He did not tolerate nadolol  in the past. Last EGD 2023 with Grade 1 varices and actually due for surveillance now. Denies any mental status changes or confusion. He feels he can breathe deeper and feels better after thoracentesis.    Prior imaging:   CT Oct 2024: 3.2 cm mass right hepatic lobe  Initially, segment 5 lesion was 1.8 cm LIRADS 5 and LIRADS 3 segment 8 lesion of 0.8 cm.   Recent endoscopic evaluations:  Colonoscopy March 2023: hemorrhoids, one 2 mm polyp s/p removal ( benign aggregates).    EGD March 2023: Grade 1 esophageal varices, gastritis s/p biopsy, portal gastropathy, duodenitis. Repeat EGD in 2 years. Negative H.pylori.    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HCC)    History of gout     Past Surgical History:  Procedure Laterality Date   BIOPSY  03/08/2016   Procedure: BIOPSY;  Surgeon: Suzette Espy, MD;  [facility];  Service: Endoscopy;;  descending colon biopsies   BIOPSY  04/14/2019   Procedure: BIOPSY;  Surgeon: Alyce Jubilee, MD;  [facility];  Service: Endoscopy;;   BIOPSY  08/21/2021   Procedure: BIOPSY;  Surgeon: Vinetta Greening, DO;  [facility];  Service: Endoscopy;;   Colonoscopy  per patient around 2014 in [place]    COLONOSCOPY WITH PROPOFOL  [name] 03/08/2016   One 8 mm polyp in the descending colon, removed with a hot snare. Resected and Retrieved.One 4 mm polyp in the cecum, removed with a cold snare. Resected and retrieved. Rectal varices. Melanosis coli. Abnormal left colon mucosa status post biopsy. Tubular adenomas. Surveillance  was due 2022.   COLONOSCOPY WITH PROPOFOL  [name] 08/21/2021   Procedure: COLONOSCOPY WITH PROPOFOL ;  Surgeon: Vinetta Greening, DO;  [facility];  Service: Endoscopy;  Laterality: [name];  11:15am   ESOPHAGOGASTRODUODENOSCOPY (EGD) WITH PROPOFOL  [name] 12/28/2015   Dr. Nolene Baumgarten: 4 columns of esophageal varices, 3 grade 1, 1 grade 2 with no evidence of bleeding. Mild portal gastropathy with no evidence of active bleeding.   ESOPHAGOGASTRODUODENOSCOPY (EGD) WITH PROPOFOL  [name] 03/08/2016   Procedure: ESOPHAGOGASTRODUODENOSCOPY (EGD) WITH PROPOFOL ;  Surgeon: Suzette Espy, MD;  [facility];  Service: Endoscopy;  Laterality: [name];   ESOPHAGOGASTRODUODENOSCOPY (EGD) WITH PROPOFOL  [name] 04/14/2019   Grade 1 and 2 esophageal varices without bleeding and no stigmata of bleeding. moderate portal gastropath, mild erosive gastritis. Path with positive H.pylori   ESOPHAGOGASTRODUODENOSCOPY (EGD) WITH PROPOFOL  [name] 08/21/2021   Procedure: ESOPHAGOGASTRODUODENOSCOPY (EGD) WITH PROPOFOL ;  Surgeon: Vinetta Greening, DO;  [facility];  Service: Endoscopy;  Laterality: [name];   GIVENS CAPSULE STUDY [name] 07/05/2017   Procedure: GIVENS CAPSULE STUDY;  Surgeon: Alyce Jubilee, MD;  [facility];  Service: Endoscopy;  Laterality: [name];  7:30am   GIVENS CAPSULE STUDY [name] 08/05/2017   Procedure: GIVENS CAPSULE STUDY;  Surgeon: Alyce Jubilee, MD;  [facility];  Service: Endoscopy;  Laterality: [name];  7:30am   ORIF ANKLE FRACTURE Right 06/02/2021   Procedure: OPEN REDUCTION INTERNAL FIXATION (ORIF) ANKLE FRACTURE;  Surgeon: Darrin Emerald, MD;  Location: [facility];  Service: Orthopedics;  Laterality: Right;   POLYPECTOMY  03/08/2016   Procedure: POLYPECTOMY;  Surgeon: Suzette Espy, MD;  [facility];  Service: Endoscopy;;  cecum and descending    POLYPECTOMY  08/21/2021   Procedure: POLYPECTOMY INTESTINAL;  Surgeon: Vinetta Greening, DO;  [facility];   Service: Endoscopy;;    Prior to Admission medications   Medication Sig Start Date End Date Taking? Authorizing Provider  acetaminophen  (TYLENOL ) 325 MG tablet Take 2 tablets (650 mg total) by mouth every 6 (six) hours as needed for mild pain (pain score 1-3) or headache (or Fever >/= 101). 03/17/23   Colin Dawley, MD  ferrous sulfate  325 (65 FE) MG EC tablet Take 1 tablet (325 mg total) by mouth daily with breakfast. 03/17/23   Colin Dawley, MD  hydrocortisone  (ANUSOL -HC) 25 MG suppository Place 1 suppository (25 mg total) rectally every 12 (twelve) hours. 03/17/23 03/16/24  Colin Dawley, MD  lactulose  (CHRONULAC ) 10 GM/15ML solution TAKE TWO TABLESPOONFUL (30 ML) BY MOUTH THREE TIMES DAILY Patient taking differently: Take 10 g by mouth as needed for moderate constipation. 03/01/21   Lanney Pitts, PA-C  pantoprazole  (PROTONIX ) 40 MG tablet Take 1 tablet (40 mg total) by mouth daily. 03/17/23 03/16/24  Colin Dawley, MD  torsemide  (DEMADEX ) 20 MG tablet TAKE 1 TABLET BY MOUTH DAILY TAKE 1/2 TABLE WHEN PATIENT HAS EXTRA SWELLING Patient taking differently: Take 10-20 mg by mouth as needed (fluid). 05/12/19   Delman Ferns, NP    Current Facility-Administered Medications  Medication Dose Route Frequency Provider Last Rate Last Admin   HYDROmorphone  (DILAUDID )  injection 0.5 mg  0.5 mg Intravenous Q4H PRN Adefeso, Oladapo, DO   0.5 mg at 10/28/23 0122   linezolid (ZYVOX) IVPB 600 mg  600 mg Intravenous Q12H Adefeso, Oladapo, DO 300 mL/hr at 10/28/23 1131 600 mg at 10/28/23 1131   ondansetron  (ZOFRAN ) tablet 4 mg  4 mg Oral Q6H PRN Adefeso, Oladapo, DO       Or   ondansetron  (ZOFRAN ) injection 4 mg  4 mg Intravenous Q6H PRN Adefeso, Oladapo, DO       polyethylene glycol (MIRALAX  / GLYCOLAX ) packet 17 g  17 g Oral Daily Pappayliou, Catherine A, DO   17 g at 10/28/23 1433   senna-docusate (Senokot-S) tablet 1 tablet  1 tablet Oral BID Pappayliou, Catherine A, DO   1 tablet at  10/28/23 1433    Allergies as of 10/27/2023   (No Known Allergies)    Family History  Problem Relation Age of Onset   Colon cancer Neg Hx    Liver disease Neg Hx     Social History   Socioeconomic History   Marital status: Married    Spouse name: Not on file   Number of children: Not on file   Years of education: Not on file   Highest education level: Not on file  Occupational History   Not on file  Tobacco Use   Smoking status: Former    Current packs/day: 0.00    Average packs/day: 0.3 packs/day for 8.0 years (2.0 ttl pk-yrs)    Types: Cigarettes    Start date: 2008    Quit date: 2016    Years since quitting: 9.4    Passive exposure: Current   Smokeless tobacco: Never  Vaping Use   Vaping status: Never Used  Substance and Sexual Activity   Alcohol use: No    Comment: former--quit after 12/2015 hospitalization   Drug use: No   Sexual activity: Yes  Other Topics Concern   Not on file  Social History Narrative   MARRIED. 2 KIDS-7 GRANDBABIES. SPENDS FREE TIME: WITH GRANDCHILDREN, AND WATCHING TV.   Social Drivers of Corporate investment banker Strain: Not on file  Food Insecurity: No Food Insecurity (10/28/2023)   Hunger Vital Sign    Worried About Running Out of Food in the Last Year: Never true    Ran Out of Food in the Last Year: Never true  Transportation Needs: No Transportation Needs (10/28/2023)   PRAPARE - Administrator, Civil Service (Medical): No    Lack of Transportation (Non-Medical): No  Physical Activity: Not on file  Stress: Not on file  Social Connections: Not on file  Intimate Partner Violence: Not At Risk (10/28/2023)   Humiliation, Afraid, Rape, and Kick questionnaire    Fear of Current or Ex-Partner: No    Emotionally Abused: No    Physically Abused: No    Sexually Abused: No     Review of Systems   Gen: Denies any fever, chills, loss of appetite, change in weight or weight loss CV: Denies chest pain, heart palpitations,  syncope, edema  Resp: Denies shortness of breath with rest, cough, wheezing, coughing up blood, and pleurisy. GI: Denies vomiting blood, jaundice, and fecal incontinence.   Denies dysphagia or odynophagia. GU : Denies urinary burning, blood in urine, urinary frequency, and urinary incontinence. MS: Denies joint pain, limitation of movement, swelling, cramps, and atrophy.  Derm: Denies rash, itching, dry skin, hives. Psych: Denies depression, anxiety, memory loss, hallucinations, and confusion. Heme: Denies  bruising or bleeding Neuro:  Denies any headaches, dizziness, paresthesias, shaking  Physical Exam   Vital Signs in last 24 hours: Temp:  [97.8 F (36.6 C)-99 F (37.2 C)] 97.8 F (36.6 C) (06/02 1432) Pulse Rate:  [66-118] 66 (06/02 1432) Resp:  [16-20] 17 (06/02 1432) BP: (102-173)/(47-111) 121/81 (06/02 1432) SpO2:  [90 %-98 %] 98 % (06/02 1432) FiO2 (%):  [21 %] 21 % (06/01 2245) Weight:  [133.8 kg-140.7 kg] 140.7 kg (06/02 0903) Last BM Date : 10/27/23  General:   Alert,  Well-developed, well-nourished, pleasant and cooperative in NAD Head:  Normocephalic and atraumatic. Eyes:  mild icterus Ears:  Normal auditory acuity. Lungs:  Clear throughout to auscultation.   Heart:  S1 S2 present without murmurs Abdomen:  Soft, mildly tender periumbilically with large reducible umbilical hernia, erythema just below umbilical hernia and nondistended. No masses, Normal bowel sounds, without guarding, and without rebound.   Rectal: deferred   Msk:  Symmetrical without gross deformities. Normal posture. Extremities:  Without edema. Neurologic:  Alert and  oriented x4. Skin:  Intact without significant lesions or rashes. Psych:  Alert and cooperative. Normal mood and affect.  Intake/Output from previous day: 06/01 0701 - 06/02 0700 In: 871.8 [IV Piggyback:871.8] Out: 650 [Urine:650] Intake/Output this shift: Total I/O In: 240 [P.O.:240] Out: -     Labs/Studies   Recent  Labs Recent Labs    10/27/23 1735  WBC 9.1  HGB 13.2  HCT 40.6  PLT 115*   BMET Recent Labs    10/27/23 1735 10/28/23 1354  NA 139 138  K 3.9 3.6  CL 105 105  CO2 23 25  GLUCOSE 88 163*  BUN 17 17  CREATININE 0.83 1.04  CALCIUM 8.4* 8.0*   LFT Recent Labs    10/27/23 1735 10/28/23 1354  PROT 6.9 6.0*  ALBUMIN  2.4* 2.0*  AST 91* 66*  ALT 34 29  ALKPHOS 235* 173*  BILITOT 5.2* 4.1*   PT/INR Recent Labs    10/27/23 2150 10/28/23 1354  LABPROT 16.9* 17.9*  INR 1.4* 1.5*    Radiology/Studies DG Chest 1 View Result Date: 10/28/2023 CLINICAL DATA:  Post thoracentesis EXAM: CHEST  1 VIEW COMPARISON:  CT 10/27/2023 FINDINGS: Large right pleural effusion. No pneumothorax following thoracentesis. Right base atelectasis. No confluent opacity or effusion on the left. Heart mediastinal contours within normal limits. IMPRESSION: Large right pleural effusion.  No pneumothorax. Electronically Signed   By: Janeece Mechanic M.D.   On: 10/28/2023 12:22   US  THORACENTESIS ASP PLEURAL SPACE W/IMG GUIDE Result Date: 10/28/2023 INDICATION: 53 year old male. History of cirrhosis. Presents the ED with abdominal pain. Found to found to be septic with a right sided pleural effusion. Request is for therapeutic and diagnostic right-sided thoracentesis EXAM: ULTRASOUND GUIDED THERAPEUTIC AND DIAGNOSTIC RIGHT SIDED THORACENTESIS MEDICATIONS: Lidocaine  1% 10 mL COMPLICATIONS: None immediate. PROCEDURE: An ultrasound guided thoracentesis was thoroughly discussed with the patient and questions answered. The benefits, risks, alternatives and complications were also discussed. The patient understands and wishes to proceed with the procedure. Written consent was obtained. Ultrasound was performed to localize and mark an adequate pocket of fluid in the right chest. The area was then prepped and draped in the normal sterile fashion. 1% Lidocaine  was used for local anesthesia. Under ultrasound guidance a 8 Fr  Safe-T-Centesis catheter was introduced. Thoracentesis was performed. The catheter was removed and a dressing applied. FINDINGS: A total of approximately 2.4 L of straw-colored fluid was removed. Samples were sent to the  laboratory as requested by the clinical team. IMPRESSION: Successful ultrasound guided therapeutic and diagnostic right sided thoracentesis yielding 2.4 L straw-colored of pleural fluid. Performed by Reagan Camera NP Electronically Signed   By: Fernando Hoyer M.D.   On: 10/28/2023 11:43   CT ABDOMEN PELVIS W CONTRAST Result Date: 10/27/2023 EXAM: CT ABDOMEN AND PELVIS WITH CONTRAST 10/27/2023 06:57:38 PM TECHNIQUE: CT of the abdomen and pelvis was performed with the administration of intravenous contrast. Multiplanar reformatted images are provided for review. Automated exposure control, iterative reconstruction, and/or weight based adjustment of the mA/kV was utilized to reduce the radiation dose to as low as reasonably achievable. COMPARISON: 03/16/2023 CLINICAL HISTORY: Umbilical hernia pain, panniculitis. Complains of pain since last night at hernia. Patient points to umbilical area. FINDINGS: LOWER CHEST: Moderate to large right pleural effusion with right lower lobe atelectasis, new. LIVER: Cirrhosis. 3.0 cm subcapsular lesion in segment 5, worrisome for HCC, but unchanged. Additional subcentimeter lesions in segment 3 and inferiorly in segment 6, indeterminate. GALLBLADDER AND BILE DUCTS: Mildly thick-walled gallbladder with layering small gallstone, unchanged. No biliary ductal dilatation. SPLEEN: No acute abnormality. PANCREAS: No acute abnormality. ADRENAL GLANDS: No acute abnormality. KIDNEYS, URETERS AND BLADDER: No stones in the kidneys or ureters. No hydronephrosis. No perinephric or periureteral stranding. Urinary bladder is unremarkable. GI AND BOWEL: Stomach demonstrates no acute abnormality. There is no bowel obstruction. No appendicitis. Small fat/fluid-containing  supraumbilical hernia, similar to prior, although now with increased fluid. Moderate periumbilical hernia containing fat and nondilated loops of small bowel, similar to the prior, without inflammatory changes. PERITONEUM AND RETROPERITONEUM: Small volume abdominopelvic ascites, new. No free air. VASCULATURE: Portal vein is patent. Perigastric varices with splenic shunt. LYMPH NODES: No lymphadenopathy. REPRODUCTIVE ORGANS: No acute abnormality. BONES AND SOFT TISSUES: Mild degenerative changes of the lumbar spine. Mild body wall edema. Mild subcutaneous stranding along the anterior abdominal wall, suggesting panniculitis/cellulitis. IMPRESSION: 1. Moderate periumbilical hernia containing fat and nondilated loops of small bowel, similar to prior, without inflammatory changes. 2. Mild subcutaneous stranding along the anterior abdominal wall, suggesting panniculitis/cellulitis. 3. Cirrhosis with a 3.0 cm subcapsular lesion in segment 5, worrisome for HCC, unchanged. Additional subcentimeter lesions in segment 3 and inferiorly in segment 6, indeterminate. 4. Moderate to large right pleural effusion with right lower lobe atelectasis, new. Small volume abdominopelvic ascites, new. Electronically signed by: Zadie Herter MD 10/27/2023 07:39 PM EDT RP Workstation: [id number]    Assessment   Jerrik Bakos is a 53 y.o. year old male with a history of cirrhosis due to ETOH, IDA, esophageal varices, H.pylori gastritis in 2020 with documented eradication, tubular adenomas , chronic GERD, remote history of encephalopathy, elevated AFP in past with liver lesions (HCC) on MRI in 2023, not a resection candidate given portal hypertension and did not want to pursue liver transplant in 2023. He was evaluated at [facility] and underwent MWA Sept 2023 with recommended repeat CT or MRI in Dec 2023. He was lost to follow-up at [facility] and actually seen inpatient Oct 2024 by [facility] for rectal bleeding but was lost to follow-up thereafter as  well. He has been admitted for abdominal wall cellulitis, panniculitis, right pleural effusion, and GI consulted due to history of cirrhosis and HCC.   Cirrhosis and complicated by HCC: MELD 3.0 is 19 today. Abdominal pain noted at umbilical hernia site and below with cellulitis. Surgery has evaluated and not a surgical candidate here locally. No obstructive symptoms. Known hx of HCC initially diagnosed in 2023 s/p microwave ablation at [facility].  Initially, concerning lesion was 1.8 cm in 2023 and now appears to be 3.2 cm. Encouragingly, he is without any signs/symptoms of encephalopathy. Minimal ascites on imaging. Due for EGD this year and can arrange as outpatient. At some point, he came off non-selective beta blocker. Recommend EGD prior to deciding best agent; he did not tolerate Nadolol  due to hypotension. He will need to be seen at [facility] as outpatient to further discuss [facility] and management.  Pleural effusion:  s/p thoracentesis today. Suspect effusion secondary to known hx of HCC, unable to r/o hepatic hydrothorax. Pleural fluid analysis including cytology is pending. Diuretic regimen consists of torsemide  10-20 mg daily. He may benefit by adding spironolactone  as well. Added additional serum analysis for LDH to determine transudative vs exudative.    Plan / Recommendations    Follow-up pending AFP Follow-up on pending thoracentesis fluid samples including cytology Will need outpatient EGD locally for variceal surveillance Will need to see [facility] as outpatient and IR for further management of known HCC 2 gram sodium diet Daily MELD labs     10/28/2023, 2:37 PM  Delman Ferns, PhD, ANP-BC [facility]

## 2023-10-28 NOTE — Progress Notes (Signed)
 Patient tolerated Right sided Thoracentesis procedure well today and 2.4 Liters of pleural fluid removed with labs collected and sent for processing. Patient verbalized understanding of post procedure instructions and transported via stretcher to xray at this time for post chest xray with no acute distress noted.

## 2023-10-28 NOTE — Consult Note (Signed)
 [facility] Consult  Reason for Consult: Umbilical hernia Referring Physician: Dr. Elyse Hand  Chief Complaint   Abdominal Pain     HPI: Antonio [name] is a 53 y.o. male who presented with a 2-day history of abdominal pain.  His pain is at his umbilicus at the site of the known umbilical hernia.  He did have some nausea yesterday without any episodes of emesis.  He last had a bowel movement yesterday and last passed gas last night.  His past medical history is significant for alcohol abuse with alcoholic hepatitis, cirrhosis with esophageal varices and hemorrhoids, hepatocellular carcinoma, iron  deficiency anemia, CHF, and gout.  He has been previously evaluated at [facility] for his liver disease and was not a candidate for surgery for his [facility] given his portal hypertension.  He did not want to pursue liver transplant in 2023.  He was lost to follow-up at that time.  In the ED, he was noted to be hemodynamically stable.  He had an elevated lactic acid of 2.3, INR of 1.4, total bilirubin of 5.2, and alkaline phosphatase of 235.  He has no leukocytosis.  He underwent a CT of the abdomen and pelvis which demonstrated moderate periumbilical hernia containing fat and nondilated small bowel, similar to prior without inflammatory changes, mild subcutaneous stranding along the anterior abdominal wall suggesting panniculitis/cellulitis, cirrhosis and concern for HCC unchanged from previous imaging, and a moderate to large right pleural effusion with small volume abdominal pelvic ascites.  This morning, he states that he still is having pain at his umbilical hernia site.  He denies nausea and vomiting.  He denies any gas today and his last bowel movement was yesterday.  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HCC)    History of gout     Past Surgical History:  Procedure Laterality Date   BIOPSY  03/08/2016   Procedure:  BIOPSY;  Surgeon: Suzette Espy, MD;  [facility];  Service: Endoscopy;;  descending colon biopsies   BIOPSY  04/14/2019   Procedure: BIOPSY;  Surgeon: Alyce Jubilee, MD;  [facility];  Service: Endoscopy;;   BIOPSY  08/21/2021   Procedure: BIOPSY;  Surgeon: Vinetta Greening, DO;  [facility];  Service: Endoscopy;;   Colonoscopy     per patient around 2014 in [place]    COLONOSCOPY WITH PROPOFOL  [name] 03/08/2016   One 8 mm polyp in the descending colon, removed with a hot snare. Resected and Retrieved.One 4 mm polyp in the cecum, removed with a cold snare. Resected and retrieved. Rectal varices. Melanosis coli. Abnormal left colon mucosa status post biopsy. Tubular adenomas. Surveillance was due 2022.   COLONOSCOPY WITH PROPOFOL  [name] 08/21/2021   Procedure: COLONOSCOPY WITH PROPOFOL ;  Surgeon: Vinetta Greening, DO;  [facility];  Service: Endoscopy;  Laterality: [name];  11:15am   ESOPHAGOGASTRODUODENOSCOPY (EGD) WITH PROPOFOL  [name] 12/28/2015   Dr. Nolene Baumgarten: 4 columns of esophageal varices, 3 grade 1, 1 grade 2 with no evidence of bleeding. Mild portal gastropathy with no evidence of active bleeding.   ESOPHAGOGASTRODUODENOSCOPY (EGD) WITH PROPOFOL  [name] 03/08/2016   Procedure: ESOPHAGOGASTRODUODENOSCOPY (EGD) WITH PROPOFOL ;  Surgeon: Suzette Espy, MD;  [facility];  Service: Endoscopy;  Laterality: [name];   ESOPHAGOGASTRODUODENOSCOPY (EGD) WITH PROPOFOL  [name] 04/14/2019   Grade 1 and 2 esophageal varices without bleeding and no stigmata of bleeding. moderate portal gastropath, mild  erosive gastritis. Path with positive H.pylori   ESOPHAGOGASTRODUODENOSCOPY (EGD) WITH PROPOFOL  [name] 08/21/2021   Procedure: ESOPHAGOGASTRODUODENOSCOPY (EGD) WITH PROPOFOL ;  Surgeon: Vinetta Greening, DO;  [facility];  Service: Endoscopy;  Laterality: [name];   GIVENS CAPSULE STUDY [name] 07/05/2017   Procedure: GIVENS CAPSULE STUDY;  Surgeon: Alyce Jubilee, MD;   [facility];  Service: Endoscopy;  Laterality: [name];  7:30am   GIVENS CAPSULE STUDY [name] 08/05/2017   Procedure: GIVENS CAPSULE STUDY;  Surgeon: Alyce Jubilee, MD;  [facility];  Service: Endoscopy;  Laterality: [name];  7:30am   ORIF ANKLE FRACTURE Right 06/02/2021   Procedure: OPEN REDUCTION INTERNAL FIXATION (ORIF) ANKLE FRACTURE;  Surgeon: Darrin Emerald, MD;  Location: [facility];  Service: Orthopedics;  Laterality: Right;   POLYPECTOMY  03/08/2016   Procedure: POLYPECTOMY;  Surgeon: Suzette Espy, MD;  [facility];  Service: Endoscopy;;  cecum and descending    POLYPECTOMY  08/21/2021   Procedure: POLYPECTOMY INTESTINAL;  Surgeon: Vinetta Greening, DO;  [facility];  Service: Endoscopy;;    Family History  Problem Relation Age of Onset   Colon cancer Neg Hx    Liver disease Neg Hx     Social History   Tobacco Use   Smoking status: Former    Current packs/day: 0.00    Average packs/day: 0.3 packs/day for 8.0 years (2.0 ttl pk-yrs)    Types: Cigarettes    Start date: 2008    Quit date: 2016    Years since quitting: 9.4    Passive exposure: Current   Smokeless tobacco: Never  Vaping Use   Vaping status: Never Used  Substance Use Topics   Alcohol use: No    Comment: former--quit after 12/2015 hospitalization   Drug use: No    Medications: I have reviewed the patient's current medications.  No Known Allergies   ROS:  Pertinent items are noted in HPI.  Blood pressure 117/70, pulse 83, temperature 98 F (36.7 C), temperature source Oral, resp. rate 18, height 6\' 2"  (1.88 m), weight (!) 140.7 kg, SpO2 97%. Physical Exam Vitals reviewed.  Constitutional:      Appearance: He is well-developed.  HENT:     Head: Normocephalic and atraumatic.  Eyes:     Extraocular Movements: Extraocular movements intact.     Pupils: Pupils are equal, round, and reactive to light.  Cardiovascular:     Rate and Rhythm: Normal rate.   Pulmonary:     Effort: Pulmonary effort is normal.  Abdominal:     Comments: Abdomen soft, nondistended, no percussion tenderness, mild periumbilical tenderness to palpation; no rigidity, guarding, rebound tenderness; large soft and reducible umbilical hernia with a 4-5 cm palpable defect  Skin:    General: Skin is warm and dry.  Neurological:     General: No focal deficit present.     Mental Status: He is alert and oriented to person, place, and time.  Psychiatric:        Mood and Affect: Mood normal.        Behavior: Behavior normal.     Results: Results for orders placed or performed during the hospital encounter of 10/27/23 (from the past 48 hours)  Comprehensive metabolic panel     Status: Abnormal   Collection Time: 10/27/23  5:35 PM  Result Value Ref Range   Sodium 139 135 - 145 mmol/L   Potassium 3.9 3.5 - 5.1 mmol/L    Comment: HEMOLYSIS  AT THIS LEVEL MAY AFFECT RESULT HEMOLYSIS AT THIS LEVEL MAY AFFECT RESULT    Chloride 105 98 - 111 mmol/L   CO2 23 22 - 32 mmol/L   Glucose, Bld 88 70 - 99 mg/dL    Comment: Glucose reference range applies only to samples taken after fasting for at least 8 hours.   BUN 17 6 - 20 mg/dL   Creatinine, Ser 1.61 0.61 - 1.24 mg/dL   Calcium 8.4 (L) 8.9 - 10.3 mg/dL   Total Protein 6.9 6.5 - 8.1 g/dL   Albumin  2.4 (L) 3.5 - 5.0 g/dL   AST 91 (H) 15 - 41 U/L    Comment: HEMOLYSIS AT THIS LEVEL MAY AFFECT RESULT HEMOLYSIS AT THIS LEVEL MAY AFFECT RESULT    ALT 34 0 - 44 U/L    Comment: HEMOLYSIS AT THIS LEVEL MAY AFFECT RESULT HEMOLYSIS AT THIS LEVEL MAY AFFECT RESULT    Alkaline Phosphatase 235 (H) 38 - 126 U/L   Total Bilirubin 5.2 (H) 0.0 - 1.2 mg/dL    Comment: HEMOLYSIS AT THIS LEVEL MAY AFFECT RESULT HEMOLYSIS AT THIS LEVEL MAY AFFECT RESULT    GFR, Estimated >60 >60 mL/min    Comment: (NOTE) Calculated using the CKD-EPI Creatinine Equation (2021)    Anion gap 11 5 - 15    Comment: Performed at [facility], [address], [place], [place] [address]  CBC     Status: Abnormal   Collection Time: 10/27/23  5:35 PM  Result Value Ref Range   WBC 9.1 4.0 - 10.5 K/uL   RBC 4.29 4.22 - 5.81 MIL/uL   Hemoglobin 13.2 13.0 - 17.0 g/dL   HCT 54.0 98.1 - 19.1 %   MCV 94.6 80.0 - 100.0 fL   MCH 30.8 26.0 - 34.0 pg   MCHC 32.5 30.0 - 36.0 g/dL   RDW 47.8 (H) 29.5 - 62.1 %   Platelets 115 (L) 150 - 400 K/uL    Comment: REPEATED TO VERIFY   nRBC 0.0 0.0 - 0.2 %    Comment: Performed at [facility], [address], [place], [place] [address]  Lipase, blood     Status: None   Collection Time: 10/27/23  5:35 PM  Result Value Ref Range   Lipase 41 11 - 51 U/L    Comment: Performed at [facility], [address], [place], [place] [address]  Lactic acid, plasma     Status: Abnormal   Collection Time: 10/27/23  5:35 PM  Result Value Ref Range   Lactic Acid, Venous 2.2 (HH) 0.5 - 1.9 mmol/L    Comment: CRITICAL RESULT CALLED TO, READ BACK BY AND VERIFIED WITH C WILLIAMS AT 1813 ON [id number] BY S DALTON Performed at [facility], [address] [place], [place] [address]   Lactic acid, plasma     Status: Abnormal   Collection Time: 10/27/23  8:50 PM  Result Value Ref Range   Lactic Acid, Venous 2.3 (HH) 0.5 - 1.9 mmol/L    Comment: CRITICAL RESULT CALLED TO, READ BACK BY AND VERIFIED WITH JASPER,A ON 10/27/23 AT 2153 BY PURDIE,J Performed at [facility], [address], [place], [place] [address]   Protime-INR     Status: Abnormal   Collection Time: 10/27/23  9:50 PM  Result Value Ref Range   Prothrombin Time 16.9 (H) 11.4 - 15.2 seconds   INR 1.4 (H) 0.8 - 1.2    Comment: (NOTE) INR goal varies based on device and disease states. Performed  at [facility], [address], [place], [place] [address]   Culture, blood (routine x 2)     Status: None (Preliminary result)   Collection Time: 10/27/23  9:50 PM   Specimen: BLOOD LEFT HAND  Result Value Ref Range   Specimen Description BLOOD LEFT HAND    Special Requests       BOTTLES DRAWN AEROBIC AND ANAEROBIC Blood Culture results may not be optimal due to an inadequate volume of blood received in culture bottles   Culture      NO GROWTH < 12 HOURS Performed at [facility], [address], [place], [place] [address]    Report Status PENDING   Glucose, pleural or peritoneal fluid     Status: None   Collection Time: 10/28/23 10:50 AM  Result Value Ref Range   Glucose, Fluid 111 mg/dL    Comment: (NOTE) No normal range established for this test Results should be evaluated in conjunction with serum values    Fluid Type-FGLU PLEURAL     Comment: Performed at [facility], [address], [place], [place] [address] CORRECTED ON 06/02 AT 1101: PREVIOUSLY REPORTED AS Pleural R   Lactate dehydrogenase (pleural or peritoneal fluid)     Status: Abnormal   Collection Time: 10/28/23 10:50 AM  Result Value Ref Range   LD, Fluid 31 (H) 3 - 23 U/L    Comment: (NOTE) Results should be evaluated in conjunction with serum values    Fluid Type-FLDH PLEURAL     Comment: Performed at [facility], [address] [place], [place] [address] CORRECTED ON 06/02 AT 1101: PREVIOUSLY REPORTED AS Pleural R   Protein, pleural or peritoneal fluid     Status: None   Collection Time: 10/28/23 10:50 AM  Result Value Ref Range   Total protein, fluid <3.0 g/dL    Comment: (NOTE) No normal range established for this test Results should be evaluated in conjunction with serum values    Fluid Type-FTP PLEURAL     Comment: Performed at [facility], [address], [place], [place] [address] CORRECTED ON 06/02 AT 1101: PREVIOUSLY REPORTED AS Pleural R     US  THORACENTESIS ASP PLEURAL SPACE W/IMG GUIDE Result Date: 10/28/2023 INDICATION: 53 year old male. History of cirrhosis. Presents the ED with abdominal pain. Found to found to be septic with a right sided pleural effusion. Request is for therapeutic and diagnostic right-sided thoracentesis EXAM: ULTRASOUND GUIDED THERAPEUTIC AND  DIAGNOSTIC RIGHT SIDED THORACENTESIS MEDICATIONS: Lidocaine  1% 10 mL COMPLICATIONS: None immediate. PROCEDURE: An ultrasound guided thoracentesis was thoroughly discussed with the patient and questions answered. The benefits, risks, alternatives and complications were also discussed. The patient understands and wishes to proceed with the procedure. Written consent was obtained. Ultrasound was performed to localize and mark an adequate pocket of fluid in the right chest. The area was then prepped and draped in the normal sterile fashion. 1% Lidocaine  was used for local anesthesia. Under ultrasound guidance a 8 Fr Safe-T-Centesis catheter was introduced. Thoracentesis was performed. The catheter was removed and a dressing applied. FINDINGS: A total of approximately 2.4 L of straw-colored fluid was removed. Samples were sent to the laboratory as requested by the clinical team. IMPRESSION: Successful ultrasound guided therapeutic and diagnostic right sided thoracentesis yielding 2.4 L straw-colored of pleural fluid. Performed by Reagan Camera NP Electronically Signed   By: Fernando Hoyer M.D.   On: 10/28/2023 11:43   CT ABDOMEN PELVIS W CONTRAST Result Date: 10/27/2023 EXAM: CT ABDOMEN AND PELVIS WITH CONTRAST  10/27/2023 06:57:38 PM TECHNIQUE: CT of the abdomen and pelvis was performed with the administration of intravenous contrast. Multiplanar reformatted images are provided for review. Automated exposure control, iterative reconstruction, and/or weight based adjustment of the mA/kV was utilized to reduce the radiation dose to as low as reasonably achievable. COMPARISON: 03/16/2023 CLINICAL HISTORY: Umbilical hernia pain, panniculitis. Complains of pain since last night at hernia. Patient points to umbilical area. FINDINGS: LOWER CHEST: Moderate to large right pleural effusion with right lower lobe atelectasis, new. LIVER: Cirrhosis. 3.0 cm subcapsular lesion in segment 5, worrisome for HCC, but unchanged.  Additional subcentimeter lesions in segment 3 and inferiorly in segment 6, indeterminate. GALLBLADDER AND BILE DUCTS: Mildly thick-walled gallbladder with layering small gallstone, unchanged. No biliary ductal dilatation. SPLEEN: No acute abnormality. PANCREAS: No acute abnormality. ADRENAL GLANDS: No acute abnormality. KIDNEYS, URETERS AND BLADDER: No stones in the kidneys or ureters. No hydronephrosis. No perinephric or periureteral stranding. Urinary bladder is unremarkable. GI AND BOWEL: Stomach demonstrates no acute abnormality. There is no bowel obstruction. No appendicitis. Small fat/fluid-containing supraumbilical hernia, similar to prior, although now with increased fluid. Moderate periumbilical hernia containing fat and nondilated loops of small bowel, similar to the prior, without inflammatory changes. PERITONEUM AND RETROPERITONEUM: Small volume abdominopelvic ascites, new. No free air. VASCULATURE: Portal vein is patent. Perigastric varices with splenic shunt. LYMPH NODES: No lymphadenopathy. REPRODUCTIVE ORGANS: No acute abnormality. BONES AND SOFT TISSUES: Mild degenerative changes of the lumbar spine. Mild body wall edema. Mild subcutaneous stranding along the anterior abdominal wall, suggesting panniculitis/cellulitis. IMPRESSION: 1. Moderate periumbilical hernia containing fat and nondilated loops of small bowel, similar to prior, without inflammatory changes. 2. Mild subcutaneous stranding along the anterior abdominal wall, suggesting panniculitis/cellulitis. 3. Cirrhosis with a 3.0 cm subcapsular lesion in segment 5, worrisome for HCC, unchanged. Additional subcentimeter lesions in segment 3 and inferiorly in segment 6, indeterminate. 4. Moderate to large right pleural effusion with right lower lobe atelectasis, new. Small volume abdominopelvic ascites, new. Electronically signed by: Zadie Herter MD 10/27/2023 07:39 PM EDT RP Workstation: [id number]     Assessment & Plan:  Shyhiem Beeney  is a 53 y.o. male who was admitted with abdominal pain, abdominal wall cellulitis, and umbilical hernia.  Imaging and blood work evaluated by myself.  -I explained to the patient that given his significant liver disease, he is not a candidate for umbilical hernia repair at this time unless it is a surgical emergency.  His hernia is soft and reducible, he is tolerating a diet without nausea and vomiting, and he is having bowel function. -I also evaluated his imaging, which demonstrated nondilated loops of small bowel within this hernia defect -I would also recommend against surgery at a [place] hospital given his liver disease and the potential for significant complications.  We can refer him to general surgery at a tertiary care facility outpatient, such as [facility] where he was previously evaluated -Okay for diet from surgical standpoint -Recommend bowel regimen -Antibiotics per primary team for cellulitis/panniculitis -No acute surgical intervention at this time -Appreciate GI recommendations -Care per hospitalist -General Surgery to sign off.  Please call with any questions or concerns.  All questions were answered to the satisfaction of the patient and family.  Note: Portions of this report may have been transcribed using voice recognition software. Every effort has been made to ensure accuracy; however, inadvertent computerized transcription errors may still be present.   -- Lidia Reels, DO [facility] [address] [place], [place] [address]  [phone] (office)

## 2023-10-28 NOTE — ED Notes (Signed)
 Pt given mouth swab to moisten mouth per his request.

## 2023-10-28 NOTE — Plan of Care (Signed)
   Problem: Education: Goal: Knowledge of General Education information will improve Description Including pain rating scale, medication(s)/side effects and non-pharmacologic comfort measures Outcome: Progressing   Problem: Health Behavior/Discharge Planning: Goal: Ability to manage health-related needs will improve Outcome: Progressing

## 2023-10-28 NOTE — Progress Notes (Signed)
 Progress Note   Patient: Antonio [name] RUE:[id number] DOB: December 17, 1970 DOA: 10/27/2023     1 DOS: the patient was seen and examined on 10/28/2023   Brief hospital admission narrative: As per H&P written by Dr. Elyse Hand on 10/27/2023 Antonio [name] is a 53 y.o. 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 pylori gastritis eradicated by documentation in 2023, hepatocellular carcinoma and history of gout with prior endoluminal evaluation showing-:: --Colonoscopy 08/21/2021 with hemorrhoids, one 2 mm polyp s/p removal ( benign aggregates).   EGD 08/21/2021: Grade 1 esophageal varices, gastritis s/p biopsy, portal gastropathy, duodenitis Patient presents with 1 day onset of abdominal pain around the bellybutton where he has hernia, pain was associated with nausea without vomiting.  He denies fever, chills, chest pain, shortness of breath.   ED Course:  In the emergency department, he was tachycardic, BP was 170/109, other vital signs were within normal range.  Workup in the ED showed normal CBC and BMP except for albumin  2.4, AST 91, ALT 34, ALP 235, total bilirubin 5.2.  Lactic acid 2.2 > 2.3. CT abdomen and pelvis with contrast showed moderate periumbilical hernia containing fat and nondilated loops of small bowel, similar to prior, without inflammatory changes. Mild subcutaneous stranding along the anterior abdominal wall, suggesting panniculitis/cellulitis. He was treated with IV ceftriaxone , fentanyl  was given due to pain, IV Lasix  40 mg x 1 was given.  Zofran  was given due to nausea and IV hydration was provided.  [facility] was asked to admit patient.  Assessment and plan 1-abdominal wall cellulitis/panniculitis - Continue treatment with Zyvox - Continue as needed analgesics - Continue local and supportive care.  2-nausea without vomiting - Continue as needed  antiemetics  3-umbilical hernia - Appreciate assistance and recommendations/input - No surgical intervention currently needed. -Outpatient nonemergent follow-up at tertiary hospital where he can be evaluated for hernia repair sx.  4-transaminitis - In a patient with history of alcoholic liver cirrhosis, esophageal varices, hemorrhoids and prior hepatocellular carcinoma - Continue supportive care - No overt bleeding appreciated - Continue PPI - Will follow recommendations by gastroenterology service. - Continue to follow blood works for daily MELD calculation - Low-sodium diet has been encouraged.  5-class II obesity -Body mass index is 39.83 kg/m.  -Low calorie diet, portion control and increase physical activity discussed with patient.  6-pleural effusion - S/p thoracentesis - Follow-up fluid analysis report - Continue diuretic regimen.  7-chronic thrombocytopenia - In the setting of cirrhosis - No overt bleeding appreciated - Continue to follow platelet count trend.   Subjective:  Afebrile, in no acute distress.  Reports feeling better.  No nausea or vomiting currently reported.  Good saturation on room air.  Physical Exam: Vitals:   10/28/23 1040 10/28/23 1050 10/28/23 1058 10/28/23 1432  BP: (!) 130/98 132/85 117/70 121/81  Pulse: 89 84 83 66  Resp: 20 20 18 17   Temp: 98 F (36.7 C)   97.8 F (36.6 C)  TempSrc: Oral   Oral  SpO2: 98% 97% 97% 98%  Weight:      Height:       General exam: Alert, awake, oriented x 3; in no acute distress. Respiratory system: Clear to auscultation. Respiratory effort normal.  Good saturation on room air. Cardiovascular system:RRR. No murmurs, rubs, gallops. Gastrointestinal system: Abdomen is obese, slightly tender to palpation and positive erythema just below umbilical hernia ; nondistended. Positive bowel sounds appreciated.  Central nervous system:  No focal neurological deficits. Extremities: No cyanosis or clubbing. Skin: No  petechiae; erythematous changes in his abdomen demonstrating cellulitic process/panniculitis. Psychiatry: Judgement and insight appear normal. Mood & affect appropriate.    Data Reviewed: Comprehensive metabolic panel: Sodium 138, potassium 3.6, chloride 105, bicarb 25, BUN 17, creatinine 1.04, albumin  2.0, AST 66, ALT 29, alk phos 173 and total bilirubin 4.1. GFR > 60 CBC: WBC 7.4, hemoglobin 11.6 and platelet count 99K   Family Communication: No family at bedside.  Disposition: Status is: Inpatient Remains inpatient appropriate because: Continue IV therapy.  Time spent: 50 minutes  Author: Justina Oman, MD 10/28/2023 3:03 PM  For on call review [email or web address].

## 2023-10-28 NOTE — Procedures (Signed)
 Ultrasound-guided diagnostic and therapeutic right sided thoracentesis performed yielding 2.4 liters of straw colored fluid. No immediate complications.   Diagnostic fluid was sent to the lab for further analysis. Follow-up chest x-ray pending. EBL is < 2ml .

## 2023-10-28 NOTE — TOC CM/SW Note (Signed)
 Transition of Care [facility]) - Inpatient Brief Assessment   Patient Details  Name: Antonio [name] MRN: [id number] Date of Birth: February 14, 1971  Transition of Care [facility]) CM/SW Contact:    Cyndie Dredge, LCSWA Phone Number: 10/28/2023, 9:19 AM   Clinical Narrative:  Transition of Care Department [facility]) has reviewed patient and no TOC needs have been identified at this time. We will continue to monitor patient advancement through interdisciplinary progression rounds. If new patient transition needs arise, please place a TOC consult.   Transition of Care Asessment: Insurance and Status: Insurance coverage has been reviewed Patient has primary care physician: Yes Home environment has been reviewed: Single Family Home Prior level of function:: Independent Prior/Current Home Services: No current home services Social Drivers of Health Review: SDOH reviewed no interventions necessary Readmission risk has been reviewed: Yes Transition of care needs: no transition of care needs at this time

## 2023-10-29 ENCOUNTER — Encounter (HOSPITAL_COMMUNITY): Payer: Self-pay | Admitting: Hematology

## 2023-10-29 DIAGNOSIS — K429 Umbilical hernia without obstruction or gangrene: Secondary | ICD-10-CM | POA: Diagnosis not present

## 2023-10-29 DIAGNOSIS — E876 Hypokalemia: Secondary | ICD-10-CM | POA: Diagnosis not present

## 2023-10-29 DIAGNOSIS — C220 Liver cell carcinoma: Secondary | ICD-10-CM | POA: Diagnosis not present

## 2023-10-29 DIAGNOSIS — L03311 Cellulitis of abdominal wall: Secondary | ICD-10-CM | POA: Diagnosis not present

## 2023-10-29 DIAGNOSIS — K7030 Alcoholic cirrhosis of liver without ascites: Secondary | ICD-10-CM | POA: Diagnosis not present

## 2023-10-29 DIAGNOSIS — K7031 Alcoholic cirrhosis of liver with ascites: Secondary | ICD-10-CM | POA: Diagnosis not present

## 2023-10-29 DIAGNOSIS — J90 Pleural effusion, not elsewhere classified: Secondary | ICD-10-CM | POA: Diagnosis not present

## 2023-10-29 DIAGNOSIS — Z8505 Personal history of malignant neoplasm of liver: Secondary | ICD-10-CM

## 2023-10-29 DIAGNOSIS — K420 Umbilical hernia with obstruction, without gangrene: Secondary | ICD-10-CM | POA: Diagnosis not present

## 2023-10-29 LAB — COMPREHENSIVE METABOLIC PANEL WITH GFR
ALT: 33 U/L (ref 0–44)
AST: 77 U/L — ABNORMAL HIGH (ref 15–41)
Albumin: 2.2 g/dL — ABNORMAL LOW (ref 3.5–5.0)
Alkaline Phosphatase: 205 U/L — ABNORMAL HIGH (ref 38–126)
Anion gap: 7 (ref 5–15)
BUN: 13 mg/dL (ref 6–20)
CO2: 26 mmol/L (ref 22–32)
Calcium: 8.3 mg/dL — ABNORMAL LOW (ref 8.9–10.3)
Chloride: 100 mmol/L (ref 98–111)
Creatinine, Ser: 1.15 mg/dL (ref 0.61–1.24)
GFR, Estimated: >60 mL/min (ref >60)
Glucose, Bld: 68 mg/dL — ABNORMAL LOW (ref 70–99)
Potassium: 2.8 mmol/L — ABNORMAL LOW (ref 3.5–5.1)
Sodium: 133 mmol/L — ABNORMAL LOW (ref 135–145)
Total Bilirubin: 3.2 mg/dL — ABNORMAL HIGH (ref 0.0–1.2)
Total Protein: 6.7 g/dL (ref 6.5–8.1)

## 2023-10-29 LAB — PROTIME-INR
INR: 1.3 — ABNORMAL HIGH (ref 0.8–1.2)
Prothrombin Time: 16.7 s — ABNORMAL HIGH (ref 11.4–15.2)

## 2023-10-29 LAB — AFP TUMOR MARKER: AFP, Serum, Tumor Marker: 4.4 ng/mL (ref 0.0–8.4)

## 2023-10-29 MED ORDER — POTASSIUM CHLORIDE CRYS ER 20 MEQ PO TBCR
40.0000 meq | EXTENDED_RELEASE_TABLET | ORAL | Status: AC
  Administered 2023-10-29 (×2): 40 meq via ORAL
  Filled 2023-10-29 (×2): qty 2

## 2023-10-29 MED ORDER — SPIRONOLACTONE 25 MG PO TABS
25.0000 mg | ORAL_TABLET | Freq: Every day | ORAL | Status: DC
  Administered 2023-10-29 – 2023-10-30 (×2): 25 mg via ORAL
  Filled 2023-10-29 (×2): qty 1

## 2023-10-29 NOTE — Progress Notes (Signed)
 Progress Note   Patient: Antonio [name]:[id number] DOB: 1971-03-08 DOA: 10/27/2023     2 DOS: the patient was seen and examined on 10/29/2023   Brief hospital admission narrative: As per H&P written by Dr. Elyse Hand on 10/27/2023 Antonio [name] is a 53 y.o. 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 pylori gastritis eradicated by documentation in 2023, hepatocellular carcinoma and history of gout with prior endoluminal evaluation showing-:: --Colonoscopy 08/21/2021 with hemorrhoids, one 2 mm polyp s/p removal ( benign aggregates).   EGD 08/21/2021: Grade 1 esophageal varices, gastritis s/p biopsy, portal gastropathy, duodenitis Patient presents with 1 day onset of abdominal pain around the bellybutton where he has hernia, pain was associated with nausea without vomiting.  He denies fever, chills, chest pain, shortness of breath.   ED Course:  In the emergency department, he was tachycardic, BP was 170/109, other vital signs were within normal range.  Workup in the ED showed normal CBC and BMP except for albumin  2.4, AST 91, ALT 34, ALP 235, total bilirubin 5.2.  Lactic acid 2.2 > 2.3. CT abdomen and pelvis with contrast showed moderate periumbilical hernia containing fat and nondilated loops of small bowel, similar to prior, without inflammatory changes. Mild subcutaneous stranding along the anterior abdominal wall, suggesting panniculitis/cellulitis. He was treated with IV ceftriaxone , fentanyl  was given due to pain, IV Lasix  40 mg x 1 was given.  Zofran  was given due to nausea and IV hydration was provided.  TRH was asked to admit patient.  Assessment and plan 1-abdominal wall cellulitis/panniculitis - Continue treatment with Zyvox - Continue as needed analgesics - Continue local and supportive care. - Anticipating most likely discharge on  10/30/2023.  2-nausea without vomiting - Continue as needed antiemetics  3-umbilical hernia - Appreciate assistance and recommendations/input - No surgical intervention currently needed. -Outpatient nonemergent follow-up at tertiary hospital where he can be evaluated for hernia repair sx.  4-transaminitis - In a patient with history of alcoholic liver cirrhosis, esophageal varices, hemorrhoids and prior hepatocellular carcinoma - Continue supportive care - No overt bleeding appreciated - Continue PPI - Will follow recommendations by gastroenterology service. - Continue to follow blood works for daily MELD calculation - Low-sodium diet has been encouraged. - Starting treatment with spironolactone .  5-class II obesity -Body mass index is 39.83 kg/m.  -Low calorie diet, portion control and increase physical activity discussed with patient.  6-pleural effusion - S/p thoracentesis - Follow-up fluid analysis report; so far transudate and most likely associated with cirrhosis. - Continue diuretic regimen.  7-chronic thrombocytopenia - In the setting of cirrhosis - No overt bleeding appreciated - Continue to follow platelet count trend.  8-hypokalemia - Will replete electrolytes and follow trend. - Continue telemetry monitoring.  Subjective:  No requiring oxygen supplementation; afebrile, no nausea, no vomiting.  Overall reporting no significant abdominal discomfort and demonstrating improvement in panniculitis/abdominal cellulitis process.  Physical Exam: Vitals:   10/28/23 1839 10/28/23 1954 10/29/23 0252 10/29/23 1448  BP: 129/78 130/82 118/69 (!) 140/94  Pulse: 86 72 76 72  Resp: 20 18 20 18   Temp: 97.9 F (36.6 C) 98 F (36.7 C) 98.3 F (36.8 C) 97.7 F (36.5 C)  TempSrc: Oral Oral Oral Oral  SpO2: 99% 100% 100% 97%  Weight:      Height:       General exam: Alert, awake, oriented x 3; morbidly obese and in no acute  distress.  Reporting no nausea or  vomiting. Respiratory system: Clear to auscultation. Respiratory effort normal.  Good saturation on room air. Cardiovascular system:RRR. No rubs or gallops. Gastrointestinal system: Abdomen is nondistended, soft and with positive bowel sounds; no guarding.  Erythematous changes from cellulitis/panniculitis significantly improved on today's exam.  Positive umbilical hernia reducible and nontender. Central nervous system: Alert and oriented.  Extremities: No cyanosis or clubbing; trace edema appreciated bilaterally. Skin: No petechiae.  Improving cellulitic process/panniculitis as mentioned above. Psychiatry: Judgement and insight appear normal. Mood & affect appropriate.   Data Reviewed: CBC: WBC 7.4, hemoglobin 11.6 and platelet count 99K Comprehensive metabolic panel: Sodium 133, potassium 2.8, chloride 100, bicarb 26, BUN 13, creatinine 1.15, AST 77, ALT 33, alkaline phosphatase 205 and GFR >60   Family Communication: Wife at bedside.  Disposition: Status is: Inpatient Remains inpatient appropriate because: Continue IV therapy.  Time spent: 50 minutes  Author: Justina Oman, MD 10/29/2023 5:45 PM  For on call review [email or web address].

## 2023-10-29 NOTE — Plan of Care (Signed)

## 2023-10-29 NOTE — Progress Notes (Addendum)
 Subjective: Reports he is doing well. No SOB at this time. Has had some mild wheezing. No abdominal pain at this time, but just received pain medications. Was having some pain around the umbilical hernia. Denies nausea or vomiting. Bowels are moving well. No mental status changes/confusion. Had scant rectal bleeding yesterday when he had a bit of a harder stool, but this has been a chronic intermittent thing for him. No recurrent bleeding today. No melena.   Reports taking 1/2 torsemide  every other day at home. Used to take a whole tablet, but this caused low blood pressure.    Objective: Vital signs in last 24 hours: Temp:  [97.8 F (36.6 C)-98.3 F (36.8 C)] 98.3 F (36.8 C) (06/03 0252) Pulse Rate:  [66-86] 76 (06/03 0252) Resp:  [17-20] 20 (06/03 0252) BP: (118-130)/(69-82) 118/69 (06/03 0252) SpO2:  [98 %-100 %] 100 % (06/03 0252) Last BM Date : 10/28/23 General:   Alert and oriented, pleasant Head:  Normocephalic and atraumatic. Eyes:  No icterus, sclera clear. Conjuctiva pink.  Mouth:  Without lesions, mucosa pink and moist.  Neck:  Supple, without thyromegaly or masses.  Heart:  S1, S2 present, no murmurs noted.  Lungs: Clear to auscultation bilaterally, without wheezing, rales, or rhonchi.  Abdomen:  Bowel sounds present, soft, non-tender, non-distended. No HSM or hernias noted. No rebound or guarding. No masses appreciated  Msk:  Symmetrical without gross deformities. Normal posture. Pulses:  Normal pulses noted. Extremities:  Without clubbing or edema. Neurologic:  Alert and  oriented x4;  grossly normal neurologically. Skin:  Warm and dry, intact without significant lesions.  Cervical Nodes:  No significant cervical adenopathy. Psych:  Alert and cooperative. Normal mood and affect.  Intake/Output from previous day: 06/02 0701 - 06/03 0700 In: 240 [P.O.:240] Out: -  Intake/Output this shift: Total I/O In: 240 [P.O.:240] Out: -   Lab Results: Recent Labs     10/27/23 1735 10/28/23 1354  WBC 9.1 7.4  HGB 13.2 11.6*  HCT 40.6 35.8*  PLT 115* 99*   BMET Recent Labs    10/27/23 1735 10/28/23 1354  NA 139 138  K 3.9 3.6  CL 105 105  CO2 23 25  GLUCOSE 88 163*  BUN 17 17  CREATININE 0.83 1.04  CALCIUM 8.4* 8.0*   LFT Recent Labs    10/27/23 1735 10/28/23 1354  PROT 6.9 6.0*  ALBUMIN  2.4* 2.0*  AST 91* 66*  ALT 34 29  ALKPHOS 235* 173*  BILITOT 5.2* 4.1*   PT/INR Recent Labs    10/27/23 2150 10/28/23 1354  LABPROT 16.9* 17.9*  INR 1.4* 1.5*    Studies/Results: DG Chest 1 View Result Date: 10/28/2023 CLINICAL DATA:  Post thoracentesis EXAM: CHEST  1 VIEW COMPARISON:  CT 10/27/2023 FINDINGS: Large right pleural effusion. No pneumothorax following thoracentesis. Right base atelectasis. No confluent opacity or effusion on the left. Heart mediastinal contours within normal limits. IMPRESSION: Large right pleural effusion.  No pneumothorax. Electronically Signed   By: Janeece Mechanic M.D.   On: 10/28/2023 12:22   US  THORACENTESIS ASP PLEURAL SPACE W/IMG GUIDE Result Date: 10/28/2023 INDICATION: 53 year old male. History of cirrhosis. Presents the ED with abdominal pain. Found to found to be septic with a right sided pleural effusion. Request is for therapeutic and diagnostic right-sided thoracentesis EXAM: ULTRASOUND GUIDED THERAPEUTIC AND DIAGNOSTIC RIGHT SIDED THORACENTESIS MEDICATIONS: Lidocaine  1% 10 mL COMPLICATIONS: None immediate. PROCEDURE: An ultrasound guided thoracentesis was thoroughly discussed with the patient and questions answered. The benefits,  risks, alternatives and complications were also discussed. The patient understands and wishes to proceed with the procedure. Written consent was obtained. Ultrasound was performed to localize and mark an adequate pocket of fluid in the right chest. The area was then prepped and draped in the normal sterile fashion. 1% Lidocaine  was used for local anesthesia. Under ultrasound guidance  a 8 Fr Safe-T-Centesis catheter was introduced. Thoracentesis was performed. The catheter was removed and a dressing applied. FINDINGS: A total of approximately 2.4 L of straw-colored fluid was removed. Samples were sent to the laboratory as requested by the clinical team. IMPRESSION: Successful ultrasound guided therapeutic and diagnostic right sided thoracentesis yielding 2.4 L straw-colored of pleural fluid. Performed by Reagan Camera NP Electronically Signed   By: Fernando Hoyer M.D.   On: 10/28/2023 11:43   CT ABDOMEN PELVIS W CONTRAST Result Date: 10/27/2023 EXAM: CT ABDOMEN AND PELVIS WITH CONTRAST 10/27/2023 06:57:38 PM TECHNIQUE: CT of the abdomen and pelvis was performed with the administration of intravenous contrast. Multiplanar reformatted images are provided for review. Automated exposure control, iterative reconstruction, and/or weight based adjustment of the mA/kV was utilized to reduce the radiation dose to as low as reasonably achievable. COMPARISON: 03/16/2023 CLINICAL HISTORY: Umbilical hernia pain, panniculitis. Complains of pain since last night at hernia. Patient points to umbilical area. FINDINGS: LOWER CHEST: Moderate to large right pleural effusion with right lower lobe atelectasis, new. LIVER: Cirrhosis. 3.0 cm subcapsular lesion in segment 5, worrisome for HCC, but unchanged. Additional subcentimeter lesions in segment 3 and inferiorly in segment 6, indeterminate. GALLBLADDER AND BILE DUCTS: Mildly thick-walled gallbladder with layering small gallstone, unchanged. No biliary ductal dilatation. SPLEEN: No acute abnormality. PANCREAS: No acute abnormality. ADRENAL GLANDS: No acute abnormality. KIDNEYS, URETERS AND BLADDER: No stones in the kidneys or ureters. No hydronephrosis. No perinephric or periureteral stranding. Urinary bladder is unremarkable. GI AND BOWEL: Stomach demonstrates no acute abnormality. There is no bowel obstruction. No appendicitis. Small fat/fluid-containing  supraumbilical hernia, similar to prior, although now with increased fluid. Moderate periumbilical hernia containing fat and nondilated loops of small bowel, similar to the prior, without inflammatory changes. PERITONEUM AND RETROPERITONEUM: Small volume abdominopelvic ascites, new. No free air. VASCULATURE: Portal vein is patent. Perigastric varices with splenic shunt. LYMPH NODES: No lymphadenopathy. REPRODUCTIVE ORGANS: No acute abnormality. BONES AND SOFT TISSUES: Mild degenerative changes of the lumbar spine. Mild body wall edema. Mild subcutaneous stranding along the anterior abdominal wall, suggesting panniculitis/cellulitis. IMPRESSION: 1. Moderate periumbilical hernia containing fat and nondilated loops of small bowel, similar to prior, without inflammatory changes. 2. Mild subcutaneous stranding along the anterior abdominal wall, suggesting panniculitis/cellulitis. 3. Cirrhosis with a 3.0 cm subcapsular lesion in segment 5, worrisome for HCC, unchanged. Additional subcentimeter lesions in segment 3 and inferiorly in segment 6, indeterminate. 4. Moderate to large right pleural effusion with right lower lobe atelectasis, new. Small volume abdominopelvic ascites, new. Electronically signed by: Zadie Herter MD 10/27/2023 07:39 PM EDT RP Workstation: [id number]    Assessment: Antonio [name] is a 53 y.o. year old male with a history of cirrhosis due to ETOH, IDA, esophageal varices, H.pylori gastritis in 2020 with documented eradication, tubular adenomas , chronic GERD, remote history of encephalopathy, elevated AFP in past with liver lesions (HCC) on MRI in 2023, not a resection candidate given portal hypertension and did not want to pursue liver transplant in 2023. He was evaluated at [facility] and underwent MWA Sept 2023 with recommended repeat CT or MRI in Dec 2023. He was lost to follow-up  at [facility] and actually seen inpatient Oct 2024 by [facility] for rectal bleeding but was lost to follow-up thereafter as well.  He has been admitted for abdominal wall cellulitis, panniculitis, right pleural effusion, and GI consulted due to history of cirrhosis and HCC.   ETOH Cirrhosis with history of HCC:  MELD 3.0 was 19 yesterday. No labs today for re-calculation.   Has small volume ascites on CT this admission and large right pleural effusion. Chronically on torsemide  10 mg every other day, reporting 20 mg caused hypotension.  Received IV Lasix  on 6/1, but has not received any other diuretics this admission.  Could consider starting low-dose Lasix  20 mg and spironolactone  25 mg to see how he tolerates this. Will discuss with Dr. Castaneda.  Vitals this morning with BP 118/69, creatinine and electrolytes within normal limits.   Encouragingly, no hepatic encephalopathy.  Regarding history of HCC, this was initially diagnosed in 2023 s/p microwave ablation at [facility]. Initial lesion was 1.8 cm in 2023, and now CT with 3.2 cm lesion.  Interestingly, AFP is normal, previously elevated in 2023.  He is going to need to follow-up with [facility] as outpatient to further discuss HCC/management.  He has history of grade 1 esophageal varices and portal hypertensive gastropathy noted on EGD in 2023.  He came off of nonselective beta-blocker at some point.  Reports nadolol  caused hypotension.  Recommend repeat EGD in the outpatient setting prior to deciding on restarting beta-blocker.    Umbilical hernia:  Not a candidate for surgery here. No obstructive symptoms. Recommend follow-up with [facility] to consider surgical correction.   Pleural effusion:  Etiology not entirely clear. Could be related to cirrhosis/HCC. Echocardiogram on file in 2017 with mild LVH, normal EF, indeterminate diastolic function.  May need to consider updating this.  Will defer to hospitalist.  S/p thoracentesis yesterday yielding 2.4 L.  Follow-up chest x-ray with large right pleural effusion.  He received IV Lasix  on 6/1 and has not received any additional diuretics  this admission.  Fluid analysis suggestive of transudative effusion.  Body fluid culture, pH, cytology still pending.   Plan: Update MELD labs today.  Start Lasix  20 mg daily for now as patient has been sensitive to dieretics in regards to development of hypotension.  Follow-up on thoracentesis culture, cytology. Will need outpatient EGD locally for esophageal variceal surveillance. Will need to see [facility] hepatology as outpatient and IR for further management of HCC. Can follow-up with [facility] general surgery regarding umbilical hernia. 2 g sodium diet Daily MELD labs. If patient is discharged tomorrow, will need to arrange close follow-up in 1-2 weeks to ensure he is tolerating diuretics ok and for further titration if possible. Will also need BMP in 1 week.    LOS: 2 days    10/29/2023, 11:46 AM   Shana Daring, [facility]   Addendum:  Due to hypokalemia, will start spironolactone  25 mg daily rather than Lasix . Correction of hypokalemia per hospitalist.   Shana Daring, PA-C [facility]

## 2023-10-30 ENCOUNTER — Telehealth (INDEPENDENT_AMBULATORY_CARE_PROVIDER_SITE_OTHER): Payer: Self-pay | Admitting: Gastroenterology

## 2023-10-30 DIAGNOSIS — F1090 Alcohol use, unspecified, uncomplicated: Secondary | ICD-10-CM | POA: Diagnosis not present

## 2023-10-30 DIAGNOSIS — E66812 Obesity, class 2: Secondary | ICD-10-CM | POA: Diagnosis not present

## 2023-10-30 DIAGNOSIS — K7031 Alcoholic cirrhosis of liver with ascites: Secondary | ICD-10-CM | POA: Diagnosis not present

## 2023-10-30 DIAGNOSIS — Z87891 Personal history of nicotine dependence: Secondary | ICD-10-CM

## 2023-10-30 DIAGNOSIS — K420 Umbilical hernia with obstruction, without gangrene: Secondary | ICD-10-CM | POA: Diagnosis not present

## 2023-10-30 DIAGNOSIS — C220 Liver cell carcinoma: Secondary | ICD-10-CM | POA: Diagnosis not present

## 2023-10-30 DIAGNOSIS — L03311 Cellulitis of abdominal wall: Secondary | ICD-10-CM | POA: Diagnosis not present

## 2023-10-30 DIAGNOSIS — M793 Panniculitis, unspecified: Secondary | ICD-10-CM

## 2023-10-30 DIAGNOSIS — J90 Pleural effusion, not elsewhere classified: Secondary | ICD-10-CM | POA: Diagnosis not present

## 2023-10-30 LAB — COMPREHENSIVE METABOLIC PANEL WITH GFR
ALT: 28 U/L (ref 0–44)
AST: 66 U/L — ABNORMAL HIGH (ref 15–41)
Albumin: 1.9 g/dL — ABNORMAL LOW (ref 3.5–5.0)
Alkaline Phosphatase: 174 U/L — ABNORMAL HIGH (ref 38–126)
Anion gap: 3 — ABNORMAL LOW (ref 5–15)
BUN: 14 mg/dL (ref 6–20)
CO2: 26 mmol/L (ref 22–32)
Calcium: 8.1 mg/dL — ABNORMAL LOW (ref 8.9–10.3)
Chloride: 107 mmol/L (ref 98–111)
Creatinine, Ser: 0.93 mg/dL (ref 0.61–1.24)
GFR, Estimated: >60 mL/min (ref >60)
Glucose, Bld: 83 mg/dL (ref 70–99)
Potassium: 4.1 mmol/L (ref 3.5–5.1)
Sodium: 136 mmol/L (ref 135–145)
Total Bilirubin: 3 mg/dL — ABNORMAL HIGH (ref 0.0–1.2)
Total Protein: 5.8 g/dL — ABNORMAL LOW (ref 6.5–8.1)

## 2023-10-30 LAB — CYTOLOGY - NON PAP

## 2023-10-30 LAB — MAGNESIUM: Magnesium: 1.4 mg/dL — ABNORMAL LOW (ref 1.7–2.4)

## 2023-10-30 LAB — CBC
HCT: 35.2 % — ABNORMAL LOW (ref 39.0–52.0)
Hemoglobin: 11 g/dL — ABNORMAL LOW (ref 13.0–17.0)
MCH: 30 pg (ref 26.0–34.0)
MCHC: 31.3 g/dL (ref 30.0–36.0)
MCV: 95.9 fL (ref 80.0–100.0)
Platelets: 107 10*3/uL — ABNORMAL LOW (ref 150–400)
RBC: 3.67 MIL/uL — ABNORMAL LOW (ref 4.22–5.81)
RDW: 19.9 % — ABNORMAL HIGH (ref 11.5–15.5)
WBC: 4.5 10*3/uL (ref 4.0–10.5)
nRBC: 0 % (ref 0.0–0.2)

## 2023-10-30 MED ORDER — LINEZOLID 600 MG PO TABS: 600.0000 mg | ORAL_TABLET | Freq: Two times a day (BID) | ORAL | 0 refills | Status: AC

## 2023-10-30 MED ORDER — SPIRONOLACTONE 25 MG PO TABS
25.0000 mg | ORAL_TABLET | Freq: Every day | ORAL | 1 refills | Status: DC
Start: 2023-10-31 — End: 2024-02-18

## 2023-10-30 MED ORDER — POTASSIUM CHLORIDE CRYS ER 20 MEQ PO TBCR: 20.0000 meq | EXTENDED_RELEASE_TABLET | Freq: Every day | ORAL | Status: DC | PRN

## 2023-10-30 MED ORDER — LINEZOLID 600 MG PO TABS
600.0000 mg | ORAL_TABLET | Freq: Two times a day (BID) | ORAL | Status: DC
  Administered 2023-10-30: 600 mg via ORAL
  Filled 2023-10-30 (×3): qty 1

## 2023-10-30 MED ORDER — LACTULOSE 10 GM/15ML PO SOLN: 10.0000 g | ORAL | Status: AC | PRN

## 2023-10-30 NOTE — Plan of Care (Signed)
   Problem: Education: Goal: Knowledge of General Education information will improve Description: Including pain rating scale, medication(s)/side effects and non-pharmacologic comfort measures Outcome: Progressing   Problem: Clinical Measurements: Goal: Ability to maintain clinical measurements within normal limits will improve Outcome: Progressing Goal: Diagnostic test results will improve Outcome: Progressing

## 2023-10-30 NOTE — Plan of Care (Signed)

## 2023-10-30 NOTE — Progress Notes (Signed)
 Subjective: Feeling somewhat better. Has had a few looser stools today. Did get some dulcolax this morning. Inquires about umbilical hernia repair as he was being evaluated at [facility] for this. A/ox4. No overt ascites or pitting edema. Dr. Michaelene Admire at bedside with plans for potential DC later today.   Objective: Vital signs in last 24 hours: Temp:  [97.7 F (36.5 C)-97.8 F (36.6 C)] 97.8 F (36.6 C) (06/03 2006) Pulse Rate:  [72-76] 76 (06/03 2006) Resp:  [17-18] 17 (06/03 2006) BP: (131-140)/(81-94) 131/81 (06/03 2006) SpO2:  [97 %-99 %] 99 % (06/03 2006) Last BM Date : 10/29/23 General:   Alert and oriented, pleasant Head:  Normocephalic and atraumatic. Eyes:  No icterus, sclera clear. Conjuctiva pink.  Mouth:  Without lesions, mucosa pink and moist.   Heart:  S1, S2 present, no murmurs noted.  Lungs: wheezing present throughout  Abdomen:  Bowel sounds present, abdomen is soft, non taut. Umbilical hernia present is soft, without discoloration.  Msk:  Symmetrical without gross deformities. Normal posture. Extremities:  mild, non pitting edema to LEs  Neurologic:  Alert and  oriented x4;  grossly normal neurologically. Skin:  Warm and dry, intact without significant lesions.  Psych:  Alert and cooperative. Normal mood and affect.  Intake/Output from previous day: 06/03 0701 - 06/04 0700 In: 240 [P.O.:240] Out: -  Intake/Output this shift: No intake/output data recorded.  Lab Results: Recent Labs    10/27/23 1735 10/28/23 1354 10/30/23 0434  WBC 9.1 7.4 4.5  HGB 13.2 11.6* 11.0*  HCT 40.6 35.8* 35.2*  PLT 115* 99* 107*   BMET Recent Labs    10/28/23 1354 10/29/23 1351 10/30/23 0434  NA 138 133* 136  K 3.6 2.8* 4.1  CL 105 100 107  CO2 25 26 26   GLUCOSE 163* 68* 83  BUN 17 13 14   CREATININE 1.04 1.15 0.93  CALCIUM 8.0* 8.3* 8.1*   LFT Recent Labs    10/28/23 1354 10/29/23 1351 10/30/23 0434  PROT 6.0* 6.7 5.8*  ALBUMIN  2.0* 2.2* 1.9*  AST 66* 77* 66*   ALT 29 33 28  ALKPHOS 173* 205* 174*  BILITOT 4.1* 3.2* 3.0*   PT/INR Recent Labs    10/28/23 1354 10/29/23 1351  LABPROT 17.9* 16.7*  INR 1.5* 1.3*    Studies/Results: DG Chest 1 View Result Date: 10/28/2023 CLINICAL DATA:  Post thoracentesis EXAM: CHEST  1 VIEW COMPARISON:  CT 10/27/2023 FINDINGS: Large right pleural effusion. No pneumothorax following thoracentesis. Right base atelectasis. No confluent opacity or effusion on the left. Heart mediastinal contours within normal limits. IMPRESSION: Large right pleural effusion.  No pneumothorax. Electronically Signed   By: Janeece Mechanic M.D.   On: 10/28/2023 12:22   US  THORACENTESIS ASP PLEURAL SPACE W/IMG GUIDE Result Date: 10/28/2023 INDICATION: 53 year old male. History of cirrhosis. Presents the ED with abdominal pain. Found to found to be septic with a right sided pleural effusion. Request is for therapeutic and diagnostic right-sided thoracentesis EXAM: ULTRASOUND GUIDED THERAPEUTIC AND DIAGNOSTIC RIGHT SIDED THORACENTESIS MEDICATIONS: Lidocaine  1% 10 mL COMPLICATIONS: None immediate. PROCEDURE: An ultrasound guided thoracentesis was thoroughly discussed with the patient and questions answered. The benefits, risks, alternatives and complications were also discussed. The patient understands and wishes to proceed with the procedure. Written consent was obtained. Ultrasound was performed to localize and mark an adequate pocket of fluid in the right chest. The area was then prepped and draped in the normal sterile fashion. 1% Lidocaine  was used for local anesthesia. Under ultrasound guidance  a 8 Fr Safe-T-Centesis catheter was introduced. Thoracentesis was performed. The catheter was removed and a dressing applied. FINDINGS: A total of approximately 2.4 L of straw-colored fluid was removed. Samples were sent to the laboratory as requested by the clinical team. IMPRESSION: Successful ultrasound guided therapeutic and diagnostic right sided  thoracentesis yielding 2.4 L straw-colored of pleural fluid. Performed by Reagan Camera NP Electronically Signed   By: Fernando Hoyer M.D.   On: 10/28/2023 11:43    Assessment: Antonio [name] is a 53 year old male with a history of cirrhosis due to ETOH, IDA, esophageal varices, H.pylori gastritis in 2020 with documented eradication, tubular adenomas , chronic GERD, remote history of encephalopathy, elevated AFP in past with liver lesions (HCC) on MRI in 2023, not a resection candidate given portal hypertension and did not want to pursue liver transplant in 2023. evaluated at [facility] and underwent MWA Sept 2023 with recommended repeat CT or MRI in Dec 2023.  lost to follow-up at [facility] and actually seen inpatient Oct 2024 by [facility] for rectal bleeding but was lost to follow-up thereafter as well.  admitted for abdominal wall cellulitis, panniculitis, right pleural effusion, and GI consulted due to history of cirrhosis and HCC.   Cirrhosis complicated by HCC:  -MELD 3.0 17 -LFTs improving though still mildly elevated with AST 66, ALT 28, Alk phos 174, T bili 3. -INR yesterday 1.3 -Surgery has evaluated, not a surgical candidate for hernia repair here given liver disease, which was discussed with the patient by General surgery and again with me today.  -no obstructive symptoms -HCC diagnosed in 2023, s/p  microwave ablation at [facility] -lesion 1.8 initially, now 3.2 cm on imaging -no signs of HE -minimal ascites on imaging -due for EGD this year, can be done outpatient -not currently NSBB, will need EGD prior to restarting, did not tolerate nadolol  due to hypotension -needs outpatient following with [facility] regarding HCC  Pleural effusion:  -s/p thoracentesis Monday with transudative fluid. Likely secondary to known cirrhosis, HCC/ hepatic hydrothorax -started on spironolactone  25mg  daily, seems to be tolerating well  -SOB improved though still some mild wheezing  -may need to consider Evaluation for  TIPS  Dr. Michaelene Admire with plans for potential discharge today which I think is reasonable from GI standpoint.  Plan: Continue spironolactone  25mg  daily Outpatient EGD for EV surveillance Follow with [facility] hepatology regarding HCC 2g sodium diet Tertiary evaluation for umbilical hernia repair Antibiotics for cellulitis per hospitalist Close outpatient GI follow up/labs in 7-10 days     LOS: 3 days    10/30/2023, 9:15 AM   Kolin Erdahl L. Elowen Debruyn, MSN, APRN, AGNP-C Adult-Gerontology Nurse Practitioner [facility]

## 2023-10-30 NOTE — Discharge Summary (Signed)
 Physician Discharge Summary   Patient: Antonio [name] MRN: [id number] DOB: 08-Jul-1970  Admit date:     10/27/2023  Discharge date: 10/30/23  Discharge Physician: Justina Oman   PCP: Theoplis Fix, MD   Recommendations at discharge:  Repeat complete metabolic panel to follow electrolytes, renal function and LFTs Make sure patient follow-up with gastroenterology service as instructed Repeat CBC to follow hemoglobin trend/stability Reassess blood pressure and adjust medication as needed. Assess complete resolution of patient's panniculitis process after completing antibiotic therapy.  Discharge Diagnoses: Principal Problem:   Abdominal wall cellulitis Active Problems:   ALC (alcoholic liver cirrhosis) (HCC)   HCC (hepatocellular carcinoma) (HCC)   Nausea   Pleural effusion on right   Obesity, Class II, BMI 35-39.9   Umbilical hernia   Ascites due to alcoholic cirrhosis (HCC)   Panniculitis  Brief hospital admission narrative: As per H&P written by Dr. Elyse Hand on 10/27/2023 Antonio [name] is a 53 y.o. 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 pylori gastritis eradicated by documentation in 2023, hepatocellular carcinoma and history of gout with prior endoluminal evaluation showing-:: --Colonoscopy 08/21/2021 with hemorrhoids, one 2 mm polyp s/p removal ( benign aggregates).   EGD 08/21/2021: Grade 1 esophageal varices, gastritis s/p biopsy, portal gastropathy, duodenitis Patient presents with 1 day onset of abdominal pain around the bellybutton where he has hernia, pain was associated with nausea without vomiting.  He denies fever, chills, chest pain, shortness of breath.   ED Course:  In the emergency department, he was tachycardic, BP was 170/109, other vital signs were within normal range.  Workup in the ED showed normal CBC and BMP except for  albumin  2.4, AST 91, ALT 34, ALP 235, total bilirubin 5.2.  Lactic acid 2.2 > 2.3. CT abdomen and pelvis with contrast showed moderate periumbilical hernia containing fat and nondilated loops of small bowel, similar to prior, without inflammatory changes. Mild subcutaneous stranding along the anterior abdominal wall, suggesting panniculitis/cellulitis. He was treated with IV ceftriaxone , fentanyl  was given due to pain, IV Lasix  40 mg x 1 was given.  Zofran  was given due to nausea and IV hydration was provided.  TR[facility] was asked to admit patient.  Assessment and Plan: 1-abdominal wall cellulitis/panniculitis Continue treatment with Zyvox to complete antibiotic therapy - Continue as needed analgesics - Patient advised to avoid scratching skin to minimize micro puncture entrances on his dermis and future infections. -Advised to follow-up with PCP in the next 10 days to assure complete resolution of infection. -Sepsis was ruled out.  2-nausea without vomiting - Continue as needed antiemetics -Continue as needed antiemetics and the use of PPI.   3-umbilical hernia - Appreciate assistance and recommendations/input - No surgical intervention currently needed. -Outpatient nonemergent follow-up at tertiary hospital where he can be evaluated for hernia repair surgery.   4-transaminitis - In a patient with history of alcoholic liver cirrhosis, esophageal varices, hemorrhoids and prior hepatocellular carcinoma - Continue supportive care - No overt bleeding appreciated - Continue PPI and at discharge daily Aldactone  and as needed Demadex  - Continue to follow recommendations by gastroenterology service. - Patient instructed to follow low-sodium diet - Outpatient follow-up with [facility]; where he previously received treatment for [facility].   5-class II obesity -Body mass index is 39.83 kg/m.  -Low calorie diet, portion control and increase physical activity discussed with patient.   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 - Continue diuretic regimen. -Outpatient evaluation for TIPS if needed recommended.   7-chronic thrombocytopenia - In the setting of cirrhosis - No overt bleeding appreciated - Continue to follow platelet count trend.   8-hypokalemia - Electrolyte has been repleted and within normal limits at discharge - Follow trend/stability with repeat blood work at follow-up visit  Consultants: General Surgery and gastroenterology service. Procedures performed: See below for x-ray reports; thoracentesis. Disposition: Home Diet recommendation: Heart healthy/low-sodium diet.  DISCHARGE MEDICATION: Allergies as of 10/30/2023   No Known Allergies      Medication List     TAKE these medications    lactulose  10 GM/15ML solution Commonly known as: CHRONULAC  Take 15 mLs (10 g total) by mouth as needed for moderate constipation. What changed: See the new instructions.   linezolid 600 MG tablet Commonly known as: ZYVOX Take 1 tablet (600 mg total) by mouth every 12 (twelve) hours for 6 days.   potassium chloride  SA 20 MEQ tablet Commonly known as: KLOR-CON  M Take 1 tablet (20 mEq total) by mouth daily as needed. As needed if demadex  medication used. What changed:  when to take this reasons to take this additional instructions   spironolactone  25 MG tablet Commonly known as: ALDACTONE  Take 1 tablet (25 mg total) by mouth daily. Start taking on: October 31, 2023   torsemide  20 MG tablet Commonly known as: DEMADEX  TAKE 1 TABLET BY MOUTH DAILY TAKE 1/2 TABLE WHEN PATIENT HAS EXTRA SWELLING What changed: See the new instructions.        Follow-up Information     Theoplis Fix, MD. Schedule an appointment as soon as possible for a visit in 10 day(s).   Specialty: Internal Medicine Contact information: [address] [place] [address] [phone]                 Discharge Exam: Antonio [name] Weights   10/27/23 1608 10/28/23 0903  Weight: 133.8 kg (!) 140.7 kg   General exam: Alert, awake, oriented x 3; morbidly obese and in no acute distress.  Reporting no nausea or vomiting. Respiratory system: Clear to auscultation. Respiratory effort normal.  Good saturation on room air. Cardiovascular system:RRR. No rubs or gallops. Gastrointestinal system: Abdomen is nondistended, soft and with positive bowel sounds; no guarding.  Erythematous changes from cellulitis/panniculitis significantly improved on today's exam.  Positive umbilical hernia reducible and nontender. Central nervous system: Alert and oriented.  Extremities: No cyanosis or clubbing; trace edema appreciated bilaterally. Skin: No petechiae.  Improving cellulitic process/panniculitis as mentioned above. Psychiatry: Judgement and insight appear normal. Mood & affect appropriate.     Condition at discharge: Stable and improved.  The results of significant diagnostics from this hospitalization (including imaging, microbiology, ancillary and laboratory) are listed below for reference.   Imaging Studies: DG Chest 1 View Result Date: 10/28/2023 CLINICAL DATA:  Post thoracentesis EXAM: CHEST  1 VIEW COMPARISON:  CT 10/27/2023 FINDINGS: Large right pleural effusion. No pneumothorax following thoracentesis. Right base atelectasis. No confluent opacity or effusion on the left. Heart mediastinal contours within normal limits. IMPRESSION: Large right pleural effusion.  No pneumothorax. Electronically Signed   By: Janeece Mechanic M.D.   On: 10/28/2023 12:22   US  THORACENTESIS ASP PLEURAL SPACE W/IMG GUIDE Result Date: 10/28/2023 INDICATION: 53 year old male. History of cirrhosis. Presents the ED with abdominal pain. Found to found to be septic with a right sided pleural effusion. Request is for therapeutic and diagnostic right-sided  thoracentesis EXAM: ULTRASOUND GUIDED THERAPEUTIC AND DIAGNOSTIC RIGHT SIDED  THORACENTESIS MEDICATIONS: Lidocaine  1% 10 mL COMPLICATIONS: None immediate. PROCEDURE: An ultrasound guided thoracentesis was thoroughly discussed with the patient and questions answered. The benefits, risks, alternatives and complications were also discussed. The patient understands and wishes to proceed with the procedure. Written consent was obtained. Ultrasound was performed to localize and mark an adequate pocket of fluid in the right chest. The area was then prepped and draped in the normal sterile fashion. 1% Lidocaine  was used for local anesthesia. Under ultrasound guidance a 8 Fr Safe-T-Centesis catheter was introduced. Thoracentesis was performed. The catheter was removed and a dressing applied. FINDINGS: A total of approximately 2.4 L of straw-colored fluid was removed. Samples were sent to the laboratory as requested by the clinical team. IMPRESSION: Successful ultrasound guided therapeutic and diagnostic right sided thoracentesis yielding 2.4 L straw-colored of pleural fluid. Performed by Reagan Camera NP Electronically Signed   By: Fernando Hoyer M.D.   On: 10/28/2023 11:43   CT ABDOMEN PELVIS W CONTRAST Result Date: 10/27/2023 EXAM: CT ABDOMEN AND PELVIS WITH CONTRAST 10/27/2023 06:57:38 PM TECHNIQUE: CT of the abdomen and pelvis was performed with the administration of intravenous contrast. Multiplanar reformatted images are provided for review. Automated exposure control, iterative reconstruction, and/or weight based adjustment of the mA/kV was utilized to reduce the radiation dose to as low as reasonably achievable. COMPARISON: 03/16/2023 CLINICAL HISTORY: Umbilical hernia pain, panniculitis. Complains of pain since last night at hernia. Patient points to umbilical area. FINDINGS: LOWER CHEST: Moderate to large right pleural effusion with right lower lobe atelectasis, new. LIVER: Cirrhosis. 3.0 cm subcapsular lesion in segment 5, worrisome for HCC, but unchanged. Additional subcentimeter  lesions in segment 3 and inferiorly in segment 6, indeterminate. GALLBLADDER AND BILE DUCTS: Mildly thick-walled gallbladder with layering small gallstone, unchanged. No biliary ductal dilatation. SPLEEN: No acute abnormality. PANCREAS: No acute abnormality. ADRENAL GLANDS: No acute abnormality. KIDNEYS, URETERS AND BLADDER: No stones in the kidneys or ureters. No hydronephrosis. No perinephric or periureteral stranding. Urinary bladder is unremarkable. GI AND BOWEL: Stomach demonstrates no acute abnormality. There is no bowel obstruction. No appendicitis. Small fat/fluid-containing supraumbilical hernia, similar to prior, although now with increased fluid. Moderate periumbilical hernia containing fat and nondilated loops of small bowel, similar to the prior, without inflammatory changes. PERITONEUM AND RETROPERITONEUM: Small volume abdominopelvic ascites, new. No free air. VASCULATURE: Portal vein is patent. Perigastric varices with splenic shunt. LYMPH NODES: No lymphadenopathy. REPRODUCTIVE ORGANS: No acute abnormality. BONES AND SOFT TISSUES: Mild degenerative changes of the lumbar spine. Mild body wall edema. Mild subcutaneous stranding along the anterior abdominal wall, suggesting panniculitis/cellulitis. IMPRESSION: 1. Moderate periumbilical hernia containing fat and nondilated loops of small bowel, similar to prior, without inflammatory changes. 2. Mild subcutaneous stranding along the anterior abdominal wall, suggesting panniculitis/cellulitis. 3. Cirrhosis with a 3.0 cm subcapsular lesion in segment 5, worrisome for HCC, unchanged. Additional subcentimeter lesions in segment 3 and inferiorly in segment 6, indeterminate. 4. Moderate to large right pleural effusion with right lower lobe atelectasis, new. Small volume abdominopelvic ascites, new. Electronically signed by: Zadie Herter MD 10/27/2023 07:39 PM EDT RP Workstation: [id number]    Microbiology: Results for orders placed or performed during  the hospital encounter of 10/27/23  Culture, blood (routine x 2)     Status: None (Preliminary result)   Collection Time: 10/27/23  9:50 PM   Specimen: BLOOD LEFT HAND  Result Value Ref Range Status   Specimen Description BLOOD LEFT HAND  Final   Special Requests   Final    BOTTLES DRAWN AEROBIC AND ANAEROBIC Blood Culture results may not be optimal due to an inadequate volume of blood received in culture bottles   Culture   Final    NO GROWTH 3 DAYS Performed at [facility], [address], [place], [place] [address]    Report Status PENDING  Incomplete  Culture, body fluid w Gram Stain-bottle     Status: None (Preliminary result)   Collection Time: 10/28/23 10:50 AM   Specimen: Pleura  Result Value Ref Range Status   Specimen Description PLEURAL  Final   Special Requests BOTTLES DRAWN AEROBIC AND ANAEROBIC 10CC  Final   Culture   Final    NO GROWTH 2 DAYS Performed at [facility], [address], [place], [place] [address]    Report Status PENDING  Incomplete  Gram stain     Status: None   Collection Time: 10/28/23 10:50 AM   Specimen: Pleura  Result Value Ref Range Status   Specimen Description PLEURAL  Final   Special Requests NONE  Final   Gram Stain   Final    NO ORGANISMS SEEN WBC PRESENT, PREDOMINANTLY MONONUCLEAR CYTOSPIN SMEAR Performed at [facility], [address] [place], [place] [address]    Report Status 10/28/2023 FINAL  Final    Labs: CBC: Recent Labs  Lab 10/27/23 1735 10/28/23 1354 10/30/23 0434  WBC 9.1 7.4 4.5  NEUTROABS  --  5.9  --   HGB 13.2 11.6* 11.0*  HCT 40.6 35.8* 35.2*  MCV 94.6 97.8 95.9  PLT 115* 99* 107*   Basic Metabolic Panel: Recent Labs  Lab 10/27/23 1735 10/28/23 1354 10/29/23 1351 10/30/23 0434  NA 139 138 133* 136  K 3.9 3.6 2.8* 4.1  CL 105 105 100 107  CO2 23 25 26 26   GLUCOSE 88 163* 68* 83  BUN 17 17 13 14   CREATININE 0.83 1.04 1.15 0.93  CALCIUM 8.4* 8.0* 8.3* 8.1*  MG  --   --   --  1.4*   Liver  Function Tests: Recent Labs  Lab 10/27/23 1735 10/28/23 1354 10/29/23 1351 10/30/23 0434  AST 91* 66* 77* 66*  ALT 34 29 33 28  ALKPHOS 235* 173* 205* 174*  BILITOT 5.2* 4.1* 3.2* 3.0*  PROT 6.9 6.0* 6.7 5.8*  ALBUMIN  2.4* 2.0* 2.2* 1.9*   CBG: No results for input(s): "GLUCAP" in the last 168 hours.  Discharge time spent: greater than 30 minutes.  Signed: Justina Oman, MD [facility] 10/30/2023

## 2023-10-31 ENCOUNTER — Other Ambulatory Visit: Payer: Self-pay

## 2023-10-31 DIAGNOSIS — F1010 Alcohol abuse, uncomplicated: Secondary | ICD-10-CM

## 2023-10-31 DIAGNOSIS — K7030 Alcoholic cirrhosis of liver without ascites: Secondary | ICD-10-CM

## 2023-10-31 NOTE — Telephone Encounter (Signed)
 Phoned and advised the of labs needing to be done this time next week / where to go. Pt expressed understanding. Labs in the system

## 2023-11-01 LAB — CULTURE, BLOOD (ROUTINE X 2)

## 2023-11-01 LAB — CHOLESTEROL, BODY FLUID: Cholesterol, Fluid: 7 mg/dL

## 2023-11-02 LAB — CULTURE, BODY FLUID W GRAM STAIN -BOTTLE: Culture: NO GROWTH

## 2023-11-06 LAB — MISC LABCORP TEST (SEND OUT): Labcorp test code: 9985

## 2023-11-07 ENCOUNTER — Ambulatory Visit (INDEPENDENT_AMBULATORY_CARE_PROVIDER_SITE_OTHER): Payer: Self-pay | Admitting: Gastroenterology

## 2023-11-07 ENCOUNTER — Other Ambulatory Visit (HOSPITAL_COMMUNITY)
Admission: RE | Admit: 2023-11-07 | Discharge: 2023-11-07 | Disposition: A | Discharge status: Home / Self Care | Source: Ambulatory Visit | Attending: Gastroenterology | Admitting: Gastroenterology

## 2023-11-07 DIAGNOSIS — F1010 Alcohol abuse, uncomplicated: Secondary | ICD-10-CM | POA: Insufficient documentation

## 2023-11-07 DIAGNOSIS — K7030 Alcoholic cirrhosis of liver without ascites: Secondary | ICD-10-CM | POA: Diagnosis present

## 2023-11-07 LAB — COMPREHENSIVE METABOLIC PANEL WITH GFR
ALT: 26 U/L (ref 0–44)
AST: 59 U/L — ABNORMAL HIGH (ref 15–41)
Albumin: 2.2 g/dL — ABNORMAL LOW (ref 3.5–5.0)
Alkaline Phosphatase: 154 U/L — ABNORMAL HIGH (ref 38–126)
Anion gap: 11 (ref 5–15)
BUN: 19 mg/dL (ref 6–20)
CO2: 22 mmol/L (ref 22–32)
Calcium: 8.7 mg/dL — ABNORMAL LOW (ref 8.9–10.3)
Chloride: 101 mmol/L (ref 98–111)
Creatinine, Ser: 1.01 mg/dL (ref 0.61–1.24)
GFR, Estimated: >60 mL/min (ref >60)
Glucose, Bld: 89 mg/dL (ref 70–99)
Potassium: 3.5 mmol/L (ref 3.5–5.1)
Sodium: 134 mmol/L — ABNORMAL LOW (ref 135–145)
Total Bilirubin: 4.9 mg/dL — ABNORMAL HIGH (ref 0.0–1.2)
Total Protein: 7.1 g/dL (ref 6.5–8.1)

## 2023-11-07 LAB — PROTIME-INR
INR: 1.3 — ABNORMAL HIGH (ref 0.8–1.2)
Prothrombin Time: 16.7 s — ABNORMAL HIGH (ref 11.4–15.2)

## 2023-11-19 NOTE — Progress Notes (Signed)
 GENERAL SURGERY NEW PATIENT NOTE   IDENTIFYING DATA: 53 y.o. male  Antonio [name] is a 53 y.o. male patient who presents to clinic  History of Present Illness   Antonio [name] is a 53 year old male with cirrhosis who presents with an umbilical hernia causing significant pain.  He has had an umbilical hernia for about a month, which has recently started causing significant pain. The pain is constant and worsens during bowel movements. He experiences a little shortness of breath when lying flat.  He has a history of cirrhosis and liver tumors, for which he underwent an ablation procedure in 2023. He has not had any prior surgery to address the hernia. He last saw a liver specialist about a year ago at [facility] and has not been followed up since. He is unsure of the specific doctor he saw but recalls having a telephone appointment with Dr. Nola in 2023.  MELD on 6/12 17.  CURRENT MEDICAL CONDITIONS: Patient Active Problem List  Diagnosis  . Cirrhosis, alcoholic (CMS/HHS-HCC)  . Morbid obesity with BMI of 45.0-49.9, adult (CMS/HHS-HCC)  . Preoperative evaluation of a medical condition to rule out surgical contraindications (TAR required)  . Liver lesion  . HCC (hepatocellular carcinoma) (CMS/HHS-HCC)     MEDICATIONS: Current Outpatient Medications  Medication Sig Dispense Refill  . lactulose  (ENULOSE ) 10 gram/15 mL oral solution Take by mouth.    . pantoprazole  (PROTONIX ) 40 MG DR tablet Take by mouth.    . TORsemide  (DEMADEX ) 20 MG tablet Take 10 mg by mouth as needed.       No current facility-administered medications for this visit.    PAST MEDICAL HISTORY:  Past Medical History:  Diagnosis Date  . Anemia    gets iron  infusions  . Cholelithiasis   . Cirrhosis, alcoholic (CMS/HHS-HCC)   . Morbid obesity with BMI of 45.0-49.9, adult (CMS/HHS-HCC) 01/30/2022    PAST SURGICAL HISTORY:  Past Surgical History:  Procedure Laterality Date  . ORIF  ANKLE FRACTURE  05/2021    FAMILY HISTORY:  Family History  Problem Relation Name Age of Onset  . Rheum arthritis Father    . Systemic hypertension Father    . Anesthesia problems Neg Hx      SOCIAL HISTORY:  Social History   Socioeconomic History  . Marital status: Married  Tobacco Use  . Smoking status: Former  . Smokeless tobacco: Never  . Tobacco comments:    quit one year ago; previous 1/2 pack per week for 10-12 years  Vaping Use  . Vaping status: Never Used  Substance and Sexual Activity  . Alcohol use: No    Comment: last use was late July 2017/February 2018; one pint of alcoholic beverage daily prior to then and since age 85-18 years  . Drug use: No  . Sexual activity: Defer   Social Drivers of Health   Food Insecurity: No Food Insecurity (10/28/2023)   Received [facility]   Hunger Vital Sign   . Within the past 12 months, you worried that your food would run out before you got the money to buy more.: Never true   . Within the past 12 months, the food you bought just didn't last and you didn't have money to get more.: Never true  Transportation Needs: No Transportation Needs (10/28/2023)   Received from [facility] - Transportation   . Lack of Transportation (Medical): No   . Lack of Transportation (Non-Medical): No  Housing Stability: Unknown (  11/19/2023)   Housing Stability Vital Sign   . Homeless in the Last Year: No    REVIEW OF SYSTEMS:  All negative except as noted in the HPI.  PHYSICAL EXAMINATION:  Ht:188 cm (6' 2.02) Wt:(!) 136.7 kg (301 lb 6.4 oz) ADJ:Anib surface area is 2.67 meters squared. Body mass index is 38.68 kg/m.   BP 131/80 (BP Location: Left upper arm, Patient Position: Sitting, BP Cuff Size: Large Adult)   Pulse 87   Ht 188 cm (6' 2.02)   Wt (!) 136.7 kg (301 lb 6.4 oz)   SpO2 98%   BMI 38.68 kg/m   General: Adult male in no acute distress HEENT: normocephalic, atraumatic, sclera non-icteric, trachea  midline CV: regular rate, intact distal pulses, skin warm dry and perfused Resp: symmetric chest rise without respiratory distress, on room air abdomen is soft. There is a reducible umbilical hernia present. Extr: symmetric, without pitting edema  ASSESSMENT: Antonio [name] is a 53 y.o. male patient who presents in consultation regarding  Assessment & Plan Umbilical hernia Symptomatic umbilical hernia with high surgical risk due to severe cirrhosis. - Refer to transplant team for liver transplant evaluation. - Discuss with Dr. Audrey for further recommendations.  Severe cirrhosis with liver tumors Severe cirrhosis with MELD score of 17. Liver tumors treated with ablation in 2023 - Refer to transplant team for liver transplant evaluation. Present to the ED if the patient develops persistent severe pain and hernia is not reducible.    Patient is encouraged to call the clinic with any questions or concerns. Patient demonstrates understanding, is amenable to the outlined plan, and there are no barriers to education today.     Attestation Statement:   I personally performed the service. (TP)    KEVIN TYLER LYNCH, MD    Attending Attestation:    Attestation Statement:   I personally saw and evaluated the patient, and participated in the management and treatment plan as documented in the resident/fellow note.  JACOB ANDREW GREENBERG, MD   Lang FELIX Audrey, MD, EdM Associate Professor of Surgery Division of Minimally Invasive Surgery  11/19/2023     3:33 PM

## 2023-12-04 ENCOUNTER — Emergency Department (HOSPITAL_COMMUNITY)
Admission: EM | Admit: 2023-12-04 | Discharge: 2023-12-04 | Disposition: A | Discharge status: Home / Self Care | Source: Ambulatory Visit | Attending: Emergency Medicine | Admitting: Emergency Medicine

## 2023-12-04 ENCOUNTER — Encounter (HOSPITAL_COMMUNITY): Payer: Self-pay

## 2023-12-04 ENCOUNTER — Emergency Department (HOSPITAL_COMMUNITY)

## 2023-12-04 ENCOUNTER — Other Ambulatory Visit: Payer: Self-pay

## 2023-12-04 DIAGNOSIS — J90 Pleural effusion, not elsewhere classified: Secondary | ICD-10-CM | POA: Diagnosis not present

## 2023-12-04 DIAGNOSIS — R059 Cough, unspecified: Secondary | ICD-10-CM | POA: Diagnosis present

## 2023-12-04 LAB — HEPATIC FUNCTION PANEL
ALT: 24 U/L (ref 0–44)
AST: 65 U/L — ABNORMAL HIGH (ref 15–41)
Albumin: 1.9 g/dL — ABNORMAL LOW (ref 3.5–5.0)
Alkaline Phosphatase: 202 U/L — ABNORMAL HIGH (ref 38–126)
Bilirubin, Direct: 1.4 mg/dL — ABNORMAL HIGH (ref 0.0–0.2)
Indirect Bilirubin: 1.8 mg/dL — ABNORMAL HIGH (ref 0.3–0.9)
Total Bilirubin: 3.2 mg/dL — ABNORMAL HIGH (ref 0.0–1.2)
Total Protein: 6.9 g/dL (ref 6.5–8.1)

## 2023-12-04 LAB — CBC WITH DIFFERENTIAL/PLATELET
Abs Immature Granulocytes: 0.02 K/uL (ref 0.00–0.07)
Basophils Absolute: 0.1 K/uL (ref 0.0–0.1)
Basophils Relative: 2 %
Eosinophils Absolute: 0.4 K/uL (ref 0.0–0.5)
Eosinophils Relative: 8 %
HCT: 35.7 % — ABNORMAL LOW (ref 39.0–52.0)
Hemoglobin: 11.2 g/dL — ABNORMAL LOW (ref 13.0–17.0)
Immature Granulocytes: 0 %
Lymphocytes Relative: 17 %
Lymphs Abs: 0.8 K/uL (ref 0.7–4.0)
MCH: 31.8 pg (ref 26.0–34.0)
MCHC: 31.4 g/dL (ref 30.0–36.0)
MCV: 101.4 fL — ABNORMAL HIGH (ref 80.0–100.0)
Monocytes Absolute: 0.5 K/uL (ref 0.1–1.0)
Monocytes Relative: 11 %
Neutro Abs: 3 K/uL (ref 1.7–7.7)
Neutrophils Relative %: 62 %
Platelets: 216 K/uL (ref 150–400)
RBC: 3.52 MIL/uL — ABNORMAL LOW (ref 4.22–5.81)
RDW: 19.4 % — ABNORMAL HIGH (ref 11.5–15.5)
WBC: 4.8 K/uL (ref 4.0–10.5)
nRBC: 0 % (ref 0.0–0.2)

## 2023-12-04 LAB — BODY FLUID CELL COUNT WITH DIFFERENTIAL
Lymphs, Fluid: 55 %
Monocyte-Macrophage-Serous Fluid: 41 % — ABNORMAL LOW (ref 50–90)
Neutrophil Count, Fluid: 4 % (ref 0–25)
Total Nucleated Cell Count, Fluid: 200 uL (ref 0–1000)

## 2023-12-04 LAB — BASIC METABOLIC PANEL WITH GFR
Anion gap: 7 (ref 5–15)
BUN: 8 mg/dL (ref 6–20)
CO2: 26 mmol/L (ref 22–32)
Calcium: 8.7 mg/dL — ABNORMAL LOW (ref 8.9–10.3)
Chloride: 106 mmol/L (ref 98–111)
Creatinine, Ser: 0.79 mg/dL (ref 0.61–1.24)
GFR, Estimated: >60 mL/min (ref >60)
Glucose, Bld: 116 mg/dL — ABNORMAL HIGH (ref 70–99)
Potassium: 3.7 mmol/L (ref 3.5–5.1)
Sodium: 139 mmol/L (ref 135–145)

## 2023-12-04 LAB — PROTEIN, PLEURAL OR PERITONEAL FLUID: Total protein, fluid: <3 g/dL

## 2023-12-04 LAB — GLUCOSE, PLEURAL OR PERITONEAL FLUID: Glucose, Fluid: 107 mg/dL

## 2023-12-04 LAB — GRAM STAIN

## 2023-12-04 LAB — LACTATE DEHYDROGENASE, PLEURAL OR PERITONEAL FLUID: LD, Fluid: 59 U/L — ABNORMAL HIGH (ref 3–23)

## 2023-12-04 MED ORDER — LIDOCAINE HCL (PF) 2 % IJ SOLN
INTRAMUSCULAR | Status: AC
Start: 2023-12-04 — End: 2023-12-04
  Filled 2023-12-04: qty 10

## 2023-12-04 NOTE — ED Notes (Signed)
 Patient transported to Ultrasound

## 2023-12-04 NOTE — Discharge Instructions (Signed)
 You had a large amount of fluid drained from your left lung.  Be sure to keep your appointment tomorrow with Ms. Boone with GI.  Follow-up with your primary care provider for recheck, return to the emergency department for any new or worsening symptoms

## 2023-12-04 NOTE — ED Triage Notes (Signed)
 Reports sob and cough x a few days and had fluid pulled out of lung last Monday but went to MD today and has recurring right large pleural effusion. Patient has cirrhosis.

## 2023-12-04 NOTE — Procedures (Signed)
 Ultrasound-guided diagnostic and therapeutic right sided thoracentesis performed yielding 2.7 liters of amber  colored fluid. No immediate complications.   Diagnostic fluid was sent to the lab for further analysis. Follow-up chest x-ray pending. EBL is < 2ml.

## 2023-12-04 NOTE — Progress Notes (Signed)
 Patient brought to US  Rm1 from ER per stretcher in no obvious distress. Thoracentesis explained, consent obtained. Prepped and draped in sterile fashion. Access obtained at Right post thorax, hazy yellow fluid returned under vacutainer suction, 2.7L total. Access removed without difficulty. Tolerated well. Transported to Xray for chest film.

## 2023-12-05 ENCOUNTER — Encounter: Payer: Self-pay | Admitting: Gastroenterology

## 2023-12-05 ENCOUNTER — Ambulatory Visit (INDEPENDENT_AMBULATORY_CARE_PROVIDER_SITE_OTHER): Admitting: Gastroenterology

## 2023-12-05 ENCOUNTER — Encounter (INDEPENDENT_AMBULATORY_CARE_PROVIDER_SITE_OTHER): Payer: Self-pay | Admitting: *Deleted

## 2023-12-05 VITALS — BP 112/77 | HR 74 | Temp 97.6°F | Ht 74.0 in | Wt 296.1 lb

## 2023-12-05 DIAGNOSIS — Z87891 Personal history of nicotine dependence: Secondary | ICD-10-CM

## 2023-12-05 DIAGNOSIS — F1091 Alcohol use, unspecified, in remission: Secondary | ICD-10-CM

## 2023-12-05 DIAGNOSIS — J90 Pleural effusion, not elsewhere classified: Secondary | ICD-10-CM

## 2023-12-05 DIAGNOSIS — K7030 Alcoholic cirrhosis of liver without ascites: Secondary | ICD-10-CM | POA: Diagnosis not present

## 2023-12-05 LAB — TRIGLYCERIDES, BODY FLUIDS: Triglycerides, Fluid: 26 mg/dL

## 2023-12-05 MED ORDER — SPIRONOLACTONE 25 MG PO TABS: 25.0000 mg | ORAL_TABLET | Freq: Every day | ORAL | 1 refills | Status: DC

## 2023-12-05 NOTE — Progress Notes (Signed)
 Gastroenterology Office Note     Primary Care Physician:  Maree Isles, MD  Primary Gastroenterologist: Dr. Cindie   Chief Complaint   Chief Complaint  Patient presents with   Follow-up    Pt arrives for hospital follow up. Pt was in hospital from 6/1-25-10/30/23 for abdominal wall cellulites. Pt also had to go to ER yesterday to have fluid drawn off lung. Wife states that the provider said he may need to have fluid drawn off about 5 more times from lungs and that we could put in standing orders for that.      History of Present Illness   Antonio [name] is a 53 y.o. male presenting today with a history of cirrhosis due to ETOH, IDA, esophageal varices, H.pylori gastritis in 2020 with documented eradication, tubular adenomas , chronic GERD, remote history of encephalopathy, elevated AFP in past with liver lesions (HCC) on MRI in 2023, not a resection candidate given portal hypertension and did not want to pursue liver transplant in 2023. He was evaluated at [facility] and underwent MWA Sept 2023 with recommended repeat CT or MRI in Dec 2023. He was lost to follow-up at [facility] and actually seen inpatient Oct 2024 by [facility] for rectal bleeding but was lost to follow-up thereafter as well. He was  admitted for abdominal wall cellulitis, panniculitis, right pleural effusion, and GI consulted due to history of cirrhosis and HCC in June 2025. He presents today after ED visit July 9th for thoracentesis. Wife present with him.  Thoracentesis June 2025 and again most recently July 9, 2.7 liters of fluid. Cell count negative. Feels much better today. Was having shortness of breath and went to Dr. Maree as outpatient. Was sent for labs and CXR, then advised to go to [facility].   Following a low salt diet. No canned foods.  No rectal bleeding. Umbilical hernia: sometimes uncomfortable. Can push in and stay in then pokes back out. No confusion or mental status changes. Sometimes feels tired.   July 21st: [facility].   Torsemide  20 mg, not taking spironolactone . States spironolactone  wouldn't help but he had been taking this just individually.   Colonoscopy March 2023: hemorrhoids, one 2 mm polyp s/p removal ( benign aggregates).    EGD March 2023: Grade 1 esophageal varices, gastritis s/p biopsy, portal gastropathy, duodenitis. Repeat EGD in 2 years. Negative H.pylori.      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HCC)    History of gout     Past Surgical History:  Procedure Laterality Date   BIOPSY  03/08/2016   Procedure: BIOPSY;  Surgeon: Lamar CHRISTELLA Hollingshead, MD;  [facility];  Service: Endoscopy;;  descending colon biopsies   BIOPSY  04/14/2019   Procedure: BIOPSY;  Surgeon: Harvey Margo CROME, MD;  [facility];  Service: Endoscopy;;   BIOPSY  08/21/2021   Procedure: BIOPSY;  Surgeon: Cindie Carlin POUR, DO;  [facility];  Service: Endoscopy;;   Colonoscopy     per patient around 2014 in [place]    COLONOSCOPY WITH PROPOFOL  [name] 03/08/2016   One 8 mm polyp in the descending colon, removed with a hot snare. Resected and Retrieved.One 4 mm polyp in the cecum, removed with a cold snare. Resected and retrieved. Rectal varices. Melanosis coli. Abnormal left colon mucosa status post biopsy. Tubular adenomas. Surveillance was due 2022.   COLONOSCOPY WITH PROPOFOL   [name] 08/21/2021   Procedure: COLONOSCOPY WITH PROPOFOL ;  Surgeon: Cindie Carlin POUR, DO;  [facility];  Service: Endoscopy;  Laterality: [name];  11:15am   ESOPHAGOGASTRODUODENOSCOPY (EGD) WITH PROPOFOL  [name] 12/28/2015   Dr. harvey: 4 columns of esophageal varices, 3 grade 1, 1 grade 2 with no evidence of bleeding. Mild portal gastropathy with no evidence of active bleeding.   ESOPHAGOGASTRODUODENOSCOPY (EGD) WITH PROPOFOL  [name] 03/08/2016   Procedure: ESOPHAGOGASTRODUODENOSCOPY (EGD) WITH PROPOFOL ;  Surgeon: Lamar CHRISTELLA Hollingshead, MD;   [facility];  Service: Endoscopy;  Laterality: [name];   ESOPHAGOGASTRODUODENOSCOPY (EGD) WITH PROPOFOL  [name] 04/14/2019   Grade 1 and 2 esophageal varices without bleeding and no stigmata of bleeding. moderate portal gastropath, mild erosive gastritis. Path with positive H.pylori   ESOPHAGOGASTRODUODENOSCOPY (EGD) WITH PROPOFOL  [name] 08/21/2021   Procedure: ESOPHAGOGASTRODUODENOSCOPY (EGD) WITH PROPOFOL ;  Surgeon: Cindie Carlin POUR, DO;  [facility];  Service: Endoscopy;  Laterality: [name];   GIVENS CAPSULE STUDY [name] 07/05/2017   Procedure: GIVENS CAPSULE STUDY;  Surgeon: harvey Margo CROME, MD;  [facility];  Service: Endoscopy;  Laterality: [name];  7:30am   GIVENS CAPSULE STUDY [name] 08/05/2017   Procedure: GIVENS CAPSULE STUDY;  Surgeon: harvey Margo CROME, MD;  [facility];  Service: Endoscopy;  Laterality: [name];  7:30am   ORIF ANKLE FRACTURE Right 06/02/2021   Procedure: OPEN REDUCTION INTERNAL FIXATION (ORIF) ANKLE FRACTURE;  Surgeon: Margrette Taft BRAVO, MD;  Location: [facility];  Service: Orthopedics;  Laterality: Right;   POLYPECTOMY  03/08/2016   Procedure: POLYPECTOMY;  Surgeon: Lamar CHRISTELLA Hollingshead, MD;  [facility];  Service: Endoscopy;;  cecum and descending    POLYPECTOMY  08/21/2021   Procedure: POLYPECTOMY INTESTINAL;  Surgeon: Cindie Carlin POUR, DO;  [facility];  Service: Endoscopy;;    Current Outpatient Medications  Medication Sig Dispense Refill   lactulose  (CHRONULAC ) 10 GM/15ML solution Take 15 mLs (10 g total) by mouth as needed for moderate constipation.     potassium chloride  SA (KLOR-CON  M) 20 MEQ tablet Take 1 tablet (20 mEq total) by mouth daily as needed. As needed if demadex  medication used.     spironolactone  (ALDACTONE ) 25 MG tablet Take 1 tablet (25 mg total) by mouth daily. 90 tablet 1   torsemide  (DEMADEX ) 20 MG tablet TAKE 1 TABLET BY MOUTH DAILY TAKE 1/2 TABLE WHEN PATIENT HAS EXTRA SWELLING 135 tablet 1    spironolactone  (ALDACTONE ) 25 MG tablet Take 1 tablet (25 mg total) by mouth daily. (Patient not taking: Reported on 12/05/2023) 30 tablet 1   No current facility-administered medications for this visit.    Allergies as of 12/05/2023   (No Known Allergies)    Family History  Problem Relation Age of Onset   Colon cancer Neg Hx    Liver disease Neg Hx     Social History   Socioeconomic History   Marital status: Married    Spouse name: Not on file   Number of children: Not on file   Years of education: Not on file   Highest education level: Not on file  Occupational History   Not on file  Tobacco Use   Smoking status: Former    Current packs/day: 0.00    Average packs/day: 0.3 packs/day for 8.0 years (2.0 ttl pk-yrs)    Types: Cigarettes    Start date: 2008    Quit date: 2016    Years since quitting: 9.5    Passive exposure: Current  Smokeless tobacco: Never  Vaping Use   Vaping status: Never Used  Substance and Sexual Activity   Alcohol use: No    Comment: former--quit after 12/2015 hospitalization   Drug use: No   Sexual activity: Yes  Other Topics Concern   Not on file  Social History Narrative   MARRIED. 2 KIDS-7 GRANDBABIES. SPENDS FREE TIME: WITH GRANDCHILDREN, AND WATCHING TV.   Social Drivers of Corporate investment banker Strain: Not on file  Food Insecurity: No Food Insecurity (10/28/2023)   Hunger Vital Sign    Worried About Running Out of Food in the Last Year: Never true    Ran Out of Food in the Last Year: Never true  Transportation Needs: No Transportation Needs (10/28/2023)   PRAPARE - Administrator, Civil Service (Medical): No    Lack of Transportation (Non-Medical): No  Physical Activity: Not on file  Stress: Not on file  Social Connections: Not on file  Intimate Partner Violence: Not At Risk (10/28/2023)   Humiliation, Afraid, Rape, and Kick questionnaire    Fear of Current or Ex-Partner: No    Emotionally Abused: No    Physically  Abused: No    Sexually Abused: No     Review of Systems   Gen: Denies any fever, chills, fatigue, weight loss, lack of appetite.  CV: Denies chest pain, heart palpitations, peripheral edema, syncope.  Resp: Denies shortness of breath at rest or with exertion. Denies wheezing or cough.  GI: Denies dysphagia or odynophagia. Denies jaundice, hematemesis, fecal incontinence. GU : Denies urinary burning, urinary frequency, urinary hesitancy MS: Denies joint pain, muscle weakness, cramps, or limitation of movement.  Derm: Denies rash, itching, dry skin Psych: Denies depression, anxiety, memory loss, and confusion Heme: Denies bruising, bleeding, and enlarged lymph nodes.   Physical Exam   BP 112/77   Pulse 74   Temp 97.6 F (36.4 C)   Ht 6' 2 (1.88 m)   Wt 296 lb 1.6 oz (134.3 kg)   BMI 38.02 kg/m  General:   Alert and oriented. Pleasant and cooperative. Well-nourished and well-developed.  Head:  Normocephalic and atraumatic. Eyes:  Without icterus Abdomen:  +BS, soft, non-tender and non-distended. Umbilical hernia protruding but reducible.  Rectal:  Deferred  Msk:  Symmetrical without gross deformities. Normal posture. Extremities:  Without edema. Neurologic:  Alert and  oriented x4;  grossly normal neurologically. No asterixis Skin:  Intact without significant lesions or rashes. Psych:  Alert and cooperative. Normal mood and affect.   Assessment   Abou L Mccready is a 53 y.o. male presenting today with a history of  cirrhosis due to ETOH, IDA, esophageal varices, H.pylori gastritis in 2020 with documented eradication, tubular adenomas , chronic GERD, remote history of encephalopathy, elevated AFP in past with liver lesions (HCC) on MRI in 2023, lost to follow-up until recently with pleural effusion and hospitalized. Returning for follow-up today with wife.  Cirrhosis: complicated by hx of varices, prior HE, HCC and  not a resection candidate given portal hypertension and did  not want to pursue liver transplant in 2023, MWA Sept 2023 at [facility], and lost to follow-up for [facility]. 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 Will attempt to titrate diuretics slightly as he is only on torsemide  20 mg daily. Will add spironolactone  25 mg daily in addition to this and recheck labs in 1 week. Upcoming appt with [facility] on 7/21. We can  continue to manage things here locally such as thoracentesis, imaging, EGD, etc. Appreciate [facility] seeing patient again due to complicated history.   Recommend EGD in future as he has had decompensation. Will arrange at next visit after seeing [facility].      PLAN    Keep upcoming appt with [facility] on 7/21 Check MELD labs today US  liver duplex. [facility] has ordered updated imaging as well for HCC (CT chest/abd/pelvis) Continue torsemide  20 mg daily and start spironolactone  25 mg daily. Stop supplemental potassium. He had repeat labs at [facility] and tolerating this regimen.  Return in mid August: discuss EGD at that time Continue to absolutely avoid ETOH   Therisa MICAEL Stager, PhD, ANP-BC [facility] Gastroenterology

## 2023-12-05 NOTE — Patient Instructions (Signed)
 I would like you to continue torsemide  20 mg daily and start taking spironolactone  25 mg daily. Stop taking potassium for now.  Please repeat labs in 1 week: this way, we can make sure you are tolerating the fluid pills. Monitor for decreased blood pressure, fatigue, etc.  I am sending you further information in MyChart about protein intake.  Keep upcoming appointment for [facility]!  We are also ordering a special ultrasound of your liver to be done in the near future.  We will see you in mid August!  I enjoyed seeing you again today! I value our relationship and want to provide genuine, compassionate, and quality care. You may receive a survey regarding your visit with me, and I welcome your feedback! Thanks so much for taking the time to complete this. I look forward to seeing you again.      Therisa MICAEL Stager, PhD, ANP-BC [facility] Gastroenterology

## 2023-12-07 LAB — CHOLESTEROL, BODY FLUID: Cholesterol, Fluid: 22 mg/dL

## 2023-12-08 NOTE — ED Provider Notes (Signed)
 [place] EMERGENCY DEPARTMENT AT [facility] Provider Note   CSN: [id number] Arrival date & time: 12/04/23  1251     Patient presents with: Cough   Delane L Pringle is a 53 y.o. male.    Cough Associated symptoms: shortness of breath   Associated symptoms: no chest pain, no chills, no fever and no rash        Luka L Littlefield is a 53 y.o. male past medical history of cirrhosis and alcoholic hepatitis, who presents to the Emergency Department complaining of shortness of breath and cough for few days.  Had thoracentesis 1 month ago.  Seen by his primary care provider had chest x-ray at another facility found to have recurrent large pleural effusion and was recommended by his PCP to return here for another thoracentesis.  He denies any fever, chills, abdominal pain nausea or vomiting.  Denies any recent alcohol use.  Prior to Admission medications   Medication Sig Start Date End Date Taking? Authorizing Provider  lactulose  (CHRONULAC ) 10 GM/15ML solution Take 15 mLs (10 g total) by mouth as needed for moderate constipation. 10/30/23   Ricky Fines, MD  potassium chloride  SA (KLOR-CON  M) 20 MEQ tablet Take 1 tablet (20 mEq total) by mouth daily as needed. As needed if demadex  medication used. 10/30/23   Ricky Fines, MD  spironolactone  (ALDACTONE ) 25 MG tablet Take 1 tablet (25 mg total) by mouth daily. Patient not taking: Reported on 12/05/2023 10/31/23   Ricky Fines, MD  spironolactone  (ALDACTONE ) 25 MG tablet Take 1 tablet (25 mg total) by mouth daily. 12/05/23   Shirlean Therisa ORN, NP  torsemide  (DEMADEX ) 20 MG tablet TAKE 1 TABLET BY MOUTH DAILY TAKE 1/2 TABLE WHEN PATIENT HAS EXTRA SWELLING 05/12/19   Shirlean Therisa ORN, NP    Allergies: Patient has no known allergies.    Review of Systems  Constitutional:  Negative for chills and fever.  Respiratory:  Positive for cough and shortness of breath.   Cardiovascular:  Negative for chest pain.  Gastrointestinal:  Negative for  abdominal pain, diarrhea, nausea and vomiting.  Genitourinary:  Negative for dysuria.  Musculoskeletal:  Negative for back pain.  Skin:  Negative for color change and rash.  Neurological:  Negative for dizziness, syncope and weakness.    Updated Vital Signs BP 125/73 (BP Location: Left Arm)   Pulse 79   Temp 98.6 F (37 C) (Oral)   Resp 20   Ht 6' 2 (1.88 m)   Wt (!) 140.6 kg   SpO2 99%   BMI 39.80 kg/m   Physical Exam Vitals and nursing note reviewed.  Constitutional:      General: He is not in acute distress.    Appearance: Normal appearance. He is not ill-appearing or toxic-appearing.  HENT:     Mouth/Throat:     Mouth: Mucous membranes are moist.  Eyes:     General: No scleral icterus.    Conjunctiva/sclera: Conjunctivae normal.  Cardiovascular:     Rate and Rhythm: Normal rate and regular rhythm.     Pulses: Normal pulses.  Pulmonary:     Effort: Pulmonary effort is normal.     Breath sounds: No wheezing or rales.  Abdominal:     Palpations: Abdomen is soft.     Tenderness: There is no abdominal tenderness.  Musculoskeletal:     Right lower leg: No edema.     Left lower leg: No edema.  Skin:    General: Skin is warm.  Capillary Refill: Capillary refill takes less than 2 seconds.  Neurological:     General: No focal deficit present.     Mental Status: He is alert.     Sensory: No sensory deficit.     Motor: No weakness.     (all labs ordered are listed, but only abnormal results are displayed) Labs Reviewed  BASIC METABOLIC PANEL WITH GFR - Abnormal; Notable for the following components:      Result Value   Glucose, Bld 116 (*)    Calcium 8.7 (*)    All other components within normal limits  CBC WITH DIFFERENTIAL/PLATELET - Abnormal; Notable for the following components:   RBC 3.52 (*)    Hemoglobin 11.2 (*)    HCT 35.7 (*)    MCV 101.4 (*)    RDW 19.4 (*)    All other components within normal limits  HEPATIC FUNCTION PANEL - Abnormal;  Notable for the following components:   Albumin  1.9 (*)    AST 65 (*)    Alkaline Phosphatase 202 (*)    Total Bilirubin 3.2 (*)    Bilirubin, Direct 1.4 (*)    Indirect Bilirubin 1.8 (*)    All other components within normal limits  BODY FLUID CELL COUNT WITH DIFFERENTIAL - Abnormal; Notable for the following components:   Color, Fluid YELLOW (*)    Appearance, Fluid HAZY (*)    Monocyte-Macrophage-Serous Fluid 41 (*)    All other components within normal limits  LACTATE DEHYDROGENASE, PLEURAL OR PERITONEAL FLUID - Abnormal; Notable for the following components:   LD, Fluid 59 (*)    All other components within normal limits  GRAM STAIN  CULTURE, BODY FLUID W GRAM STAIN -BOTTLE  CHOLESTEROL, BODY FLUID  GLUCOSE, PLEURAL OR PERITONEAL FLUID  PROTEIN, PLEURAL OR PERITONEAL FLUID  TRIGLYCERIDES, BODY FLUIDS  MISCELLANEOUS TEST  CYTOLOGY - NON PAP    EKG: EKG Interpretation Date/Time:  Wednesday December 04 2023 13:14:38 EDT Ventricular Rate:  87 PR Interval:  184 QRS Duration:  86 QT Interval:  384 QTC Calculation: 462 R Axis:   94  Text Interpretation: Sinus rhythm with Premature atrial complexes Rightward axis Borderline ECG Confirmed by Darliss Rogue ([id number]) on 12/04/2023 5:03:58 PM  Radiology: No results found.   Procedures   Medications Ordered in the ED - No data to display                                  Medical Decision Making Patient with history of alcohol related cirrhosis recently underwent thoracentesis 1 month ago.  Seen by his PCP and had chest x-ray at another facility that showed recurring pleural effusion.  Has been short of breath and had nonproductive cough for few days.  No fever or chills.  Denies any chest pain or abdominal pain or peripheral edema.   Lung sounds slightly diminished on the right.  No increased work of breathing on exam.  No hypoxia.   I do not appreciate ascites on my exam.  Will obtain labs and repeat chest x-ray, if  recurrent pleural effusion may need repeat thoracentesis.  ACS PE pneumonia also considered.  Doubt sepsis.  Amount and/or Complexity of Data Reviewed Labs: ordered.    Details: Electrolytes without derangement.  Mildly elevated LFTs, no leukocytosis.  Labs obtained from body fluid, results pending Radiology: ordered.    Details: Chest x-ray shows persistent right large right pleural effusion  ECG/medicine tests: ordered.    Details: EKG shows sinus rhythm with PACs Discussion of management or test interpretation with external provider(s):  Patient underwent thoracentesis here, removal of greater than 2 L fluid.  Cultures are pending.  Patient reports feeling better and states he is ready for discharge home.  He has follow-up appointment with GI this week  Patient had fluid cultures and labs obtained from previous thoracentesis, I called GI in consult with Dr. Shaaron for recommendation for need for repeat labs  Repeat chest x-ray after thoracentesis shows improved variation in the right lung negative for pneumothorax  Patient appears appropriate for discharge home, vital signs are reassuring.  He will keep his upcoming GI appointment and close outpatient follow-up with PCP as well.  Return precautions were discussed        Final diagnoses:  Pleural effusion    ED Discharge Orders     None          Herlinda Milling, PA-C 12/08/23 1344    Suzette Pac, MD 12/17/23 1205

## 2023-12-09 LAB — CULTURE, BODY FLUID W GRAM STAIN -BOTTLE
Culture: NO GROWTH
Special Requests: ADEQUATE

## 2023-12-09 LAB — CYTOLOGY - NON PAP

## 2023-12-11 LAB — MISC LABCORP TEST (SEND OUT): Labcorp test code: 9985

## 2023-12-13 ENCOUNTER — Ambulatory Visit (HOSPITAL_COMMUNITY)
Admission: RE | Admit: 2023-12-13 | Discharge: 2023-12-13 | Disposition: A | Discharge status: Home / Self Care | Source: Ambulatory Visit | Attending: Gastroenterology | Admitting: Gastroenterology

## 2023-12-13 DIAGNOSIS — K7030 Alcoholic cirrhosis of liver without ascites: Secondary | ICD-10-CM | POA: Insufficient documentation

## 2023-12-13 DIAGNOSIS — J90 Pleural effusion, not elsewhere classified: Secondary | ICD-10-CM | POA: Diagnosis present

## 2023-12-23 ENCOUNTER — Ambulatory Visit: Payer: Self-pay | Admitting: Gastroenterology

## 2024-02-03 ENCOUNTER — Telehealth: Payer: Self-pay | Admitting: Gastroenterology

## 2024-02-03 DIAGNOSIS — J90 Pleural effusion, not elsewhere classified: Secondary | ICD-10-CM

## 2024-02-03 NOTE — Telephone Encounter (Signed)
 Patient called in to have orders placed to have fluid drawn off his lungs. Spoke with Mindy and she did not have any standing orders on patient.

## 2024-02-04 NOTE — Telephone Encounter (Signed)
 If he is symptomatic, please arrange thoracentesis. We need to make sure he has an appt upcoming with us .

## 2024-02-05 NOTE — Telephone Encounter (Signed)
 THORA scheduled for 9/12, arrival 9:45am. Called pt and he is aware of thora appt. Also schedule f/u appt.

## 2024-02-05 NOTE — Addendum Note (Signed)
 Addended by: JEANELL GRAEME RAMAN on: 02/05/2024 08:11 AM   Modules accepted: Orders

## 2024-02-07 ENCOUNTER — Ambulatory Visit (HOSPITAL_COMMUNITY)
Admission: RE | Admit: 2024-02-07 | Discharge: 2024-02-07 | Disposition: A | Discharge status: Home / Self Care | Source: Ambulatory Visit | Attending: Gastroenterology | Admitting: Gastroenterology

## 2024-02-07 ENCOUNTER — Ambulatory Visit: Payer: Self-pay | Admitting: Gastroenterology

## 2024-02-07 ENCOUNTER — Other Ambulatory Visit: Payer: Self-pay | Admitting: Gastroenterology

## 2024-02-07 DIAGNOSIS — J90 Pleural effusion, not elsewhere classified: Secondary | ICD-10-CM | POA: Diagnosis present

## 2024-02-07 NOTE — Progress Notes (Signed)
 Limited US  of the right and left chest shows no pleural fluid.  No procedure performed. Imaging results discussed with patient.  Images are available for review under imaging section of Epic.  Please call with questions or concerns.   Antonio [name]

## 2024-02-18 ENCOUNTER — Ambulatory Visit (INDEPENDENT_AMBULATORY_CARE_PROVIDER_SITE_OTHER): Admitting: Gastroenterology

## 2024-02-18 ENCOUNTER — Encounter: Payer: Self-pay | Admitting: Gastroenterology

## 2024-02-18 VITALS — BP 119/81 | HR 65 | Temp 97.8°F | Ht 74.0 in | Wt 281.1 lb

## 2024-02-18 DIAGNOSIS — K7030 Alcoholic cirrhosis of liver without ascites: Secondary | ICD-10-CM | POA: Diagnosis not present

## 2024-02-18 DIAGNOSIS — C220 Liver cell carcinoma: Secondary | ICD-10-CM

## 2024-02-18 MED ORDER — RIFAXIMIN 550 MG PO TABS: 550.0000 mg | ORAL_TABLET | Freq: Two times a day (BID) | ORAL | 5 refills | Status: DC

## 2024-02-18 NOTE — Patient Instructions (Signed)
 We are arranging an upper endoscopy with Dr. Cindie to reassess the blood vessels in your esophagus.  Continue with torsemide  20 mg daily and spironolactone  25 mg daily as this is working for you.  I have sent in Xifaxan  to take twice a day ongoing; this is to help keep the good bacteria present in your gut. Continue the lactulose  as needed.  We will see you in 6 months or sooner if needed!  I enjoyed seeing you again today! I value our relationship and want to provide genuine, compassionate, and quality care. You may receive a survey regarding your visit with me, and I welcome your feedback! Thanks so much for taking the time to complete this. I look forward to seeing you again.      Therisa MICAEL Stager, PhD, ANP-BC [facility] Gastroenterology

## 2024-02-18 NOTE — Progress Notes (Addendum)
 Gastroenterology Office Note     Primary Care Physician:  Maree Isles, MD  Primary Gastroenterologist: Dr. Cindie   Chief Complaint   Chief Complaint  Patient presents with   Follow-up    Pt arrives for follow up. No issues at this time.      History of Present Illness   Antonio [name] is a 53 y.o. male presenting today with a history of cirrhosis due to ETOH, IDA, esophageal varices, H.pylori gastritis in 2020 with documented eradication, tubular adenomas , chronic GERD, remote history of encephalopathy, most recently recurrent right pleural effusions and needing thoracentesis, HCC s/p ablation in Sept 2023, established care again with [facility] this year and last seen by Transplant Aug 2025, last seen in our office July 2025.   Per notes at [facility], he is unsure about transplant evaluation. He will see transplant again in 6 months. CT cirrhosis Aug 2025 as below with new segment 6 lesion.   Currently taking Torsemide  20 mg and spironolactone  25 mg. Lactulose  as needed. Having a BM about twice a day. Will take lactulose  if does not have 2 BMs a day. Xifaxan  not covered in the past. Currently not taking. This has not been sent in recently. Back to work. Wasn't able to get disability. No abdominal pain, encephalopathy, overt GI bleeding, N/V, mental status changes, confusion. No shortness of breath, chest pain. No abdominal swelling or lower extremity edema.   Labs in July reviewed: INR 1.3, Hgb 11.4, platelets 156, AST 78, ALT 24, Tbili 2.9, Alk Phos 234, Albumin  2.1. creatinine 1.1. Sodium 138. AFP 5.1.  MELD 3.0 17.    Imaging: CT cirrhosis Aug 2025 at [facility]: interval ablation of segment 5 lesion, new segment 6 lesion with washout, LR-30 November 2023 US  liver doppler: patent portal vein  Colonoscopy March 2023: hemorrhoids, one 2 mm polyp s/p removal ( benign aggregates).    EGD March 2023: Grade 1 esophageal varices, gastritis s/p biopsy, portal gastropathy, duodenitis. Repeat EGD in  2 years. Negative H.pylori.   Hepatitis A antibody negative in 2017, Hep B surface antigen negative in 2017. Hep B surfacve antibody negative. Hep C negative. Completed Hep A/B vaccinations 2017.    Past Medical History:  Diagnosis Date   Alcohol abuse    Alcoholic hepatitis (HCC)    Cirrhosis with alcoholism (HCC)    completed Hep A and B vaccinations in 2017   Hepatocellular carcinoma (HCC)    History of gout     Past Surgical History:  Procedure Laterality Date   BIOPSY  03/08/2016   Procedure: BIOPSY;  Surgeon: Lamar CHRISTELLA Hollingshead, MD;  [facility];  Service: Endoscopy;;  descending colon biopsies   BIOPSY  04/14/2019   Procedure: BIOPSY;  Surgeon: Harvey Margo CROME, MD;  [facility];  Service: Endoscopy;;   BIOPSY  08/21/2021   Procedure: BIOPSY;  Surgeon: Cindie Carlin POUR, DO;  [facility];  Service: Endoscopy;;   Colonoscopy     per patient around 2014 in [place]    COLONOSCOPY WITH PROPOFOL  [name] 03/08/2016   One 8 mm polyp in the descending colon, removed with a hot snare. Resected and Retrieved.One 4 mm polyp in the cecum, removed with a cold snare. Resected and retrieved. Rectal varices. Melanosis coli. Abnormal left colon mucosa status post biopsy. Tubular adenomas. Surveillance was due 2022.   COLONOSCOPY WITH PROPOFOL  [name] 08/21/2021   Procedure: COLONOSCOPY WITH PROPOFOL ;  Surgeon: Cindie Carlin POUR, DO;  [facility];  Service: Endoscopy;  Laterality: [name];  11:15am   ESOPHAGOGASTRODUODENOSCOPY (EGD) WITH PROPOFOL  [name] 12/28/2015   Dr. harvey: 4 columns of esophageal varices, 3 grade 1, 1 grade 2 with no evidence of bleeding. Mild portal gastropathy with no evidence of active bleeding.   ESOPHAGOGASTRODUODENOSCOPY (EGD) WITH PROPOFOL  [name] 03/08/2016   Procedure: ESOPHAGOGASTRODUODENOSCOPY (EGD) WITH PROPOFOL ;  Surgeon: Lamar CHRISTELLA Hollingshead, MD;  [facility];  Service: Endoscopy;  Laterality: [name];   ESOPHAGOGASTRODUODENOSCOPY (EGD)  WITH PROPOFOL  [name] 04/14/2019   Grade 1 and 2 esophageal varices without bleeding and no stigmata of bleeding. moderate portal gastropath, mild erosive gastritis. Path with positive H.pylori   ESOPHAGOGASTRODUODENOSCOPY (EGD) WITH PROPOFOL  [name] 08/21/2021   Procedure: ESOPHAGOGASTRODUODENOSCOPY (EGD) WITH PROPOFOL ;  Surgeon: Cindie Carlin POUR, DO;  [facility];  Service: Endoscopy;  Laterality: [name];   GIVENS CAPSULE STUDY [name] 07/05/2017   Procedure: GIVENS CAPSULE STUDY;  Surgeon: Harvey Margo CROME, MD;  [facility];  Service: Endoscopy;  Laterality: [name];  7:30am   GIVENS CAPSULE STUDY [name] 08/05/2017   Procedure: GIVENS CAPSULE STUDY;  Surgeon: Harvey Margo CROME, MD;  [facility];  Service: Endoscopy;  Laterality: [name];  7:30am   ORIF ANKLE FRACTURE Right 06/02/2021   Procedure: OPEN REDUCTION INTERNAL FIXATION (ORIF) ANKLE FRACTURE;  Surgeon: Margrette Taft BRAVO, MD;  Location: [facility];  Service: Orthopedics;  Laterality: Right;   POLYPECTOMY  03/08/2016   Procedure: POLYPECTOMY;  Surgeon: Lamar CHRISTELLA Hollingshead, MD;  [facility];  Service: Endoscopy;;  cecum and descending    POLYPECTOMY  08/21/2021   Procedure: POLYPECTOMY INTESTINAL;  Surgeon: Cindie Carlin POUR, DO;  [facility];  Service: Endoscopy;;    Current Outpatient Medications  Medication Sig Dispense Refill   lactulose  (CHRONULAC ) 10 GM/15ML solution Take 15 mLs (10 g total) by mouth as needed for moderate constipation.     rifaximin  (XIFAXAN ) 550 MG TABS tablet Take 1 tablet (550 mg total) by mouth 2 (two) times daily. 60 tablet 5   spironolactone  (ALDACTONE ) 25 MG tablet Take 1 tablet (25 mg total) by mouth daily. 90 tablet 1   torsemide  (DEMADEX ) 20 MG tablet TAKE 1 TABLET BY MOUTH DAILY TAKE 1/2 TABLE WHEN PATIENT HAS EXTRA SWELLING 135 tablet 1   No current facility-administered medications for this visit.    Allergies as of 02/18/2024   (No Known Allergies)    Family History   Problem Relation Age of Onset   Colon cancer Neg Hx    Liver disease Neg Hx     Social History   Socioeconomic History   Marital status: Married    Spouse name: Not on file   Number of children: Not on file   Years of education: Not on file   Highest education level: Not on file  Occupational History   Not on file  Tobacco Use   Smoking status: Former    Current packs/day: 0.00    Average packs/day: 0.3 packs/day for 8.0 years (2.0 ttl pk-yrs)    Types: Cigarettes    Start date: 2008    Quit date: 2016    Years since quitting: 9.7    Passive exposure: Current   Smokeless tobacco: Never  Vaping Use   Vaping status: Never Used  Substance and Sexual Activity   Alcohol use: No    Comment: former--quit after 12/2015 hospitalization   Drug use: No   Sexual activity: Yes  Other Topics Concern   Not  on file  Social History Narrative   MARRIED. 2 KIDS-7 GRANDBABIES. SPENDS FREE TIME: WITH GRANDCHILDREN, AND WATCHING TV.   Social Drivers of Corporate Investment Banker Strain: Not on file  Food Insecurity: No Food Insecurity (10/28/2023)   Hunger Vital Sign    Worried About Running Out of Food in the Last Year: Never true    Ran Out of Food in the Last Year: Never true  Transportation Needs: No Transportation Needs (10/28/2023)   PRAPARE - Administrator, Civil Service (Medical): No    Lack of Transportation (Non-Medical): No  Physical Activity: Not on file  Stress: Not on file  Social Connections: Not on file  Intimate Partner Violence: Not At Risk (10/28/2023)   Humiliation, Afraid, Rape, and Kick questionnaire    Fear of Current or Ex-Partner: No    Emotionally Abused: No    Physically Abused: No    Sexually Abused: No     Review of Systems   Gen: Denies any fever, chills, fatigue, weight loss, lack of appetite.  CV: Denies chest pain, heart palpitations, peripheral edema, syncope.  Resp: Denies shortness of breath at rest or with exertion. Denies  wheezing or cough.  GI: Denies dysphagia or odynophagia. Denies jaundice, hematemesis, fecal incontinence. GU : Denies urinary burning, urinary frequency, urinary hesitancy MS: Denies joint pain, muscle weakness, cramps, or limitation of movement.  Derm: Denies rash, itching, dry skin Psych: Denies depression, anxiety, memory loss, and confusion Heme: Denies bruising, bleeding, and enlarged lymph nodes.   Physical Exam   BP 119/81   Pulse 65   Temp 97.8 F (36.6 C)   Ht 6' 2 (1.88 m)   Wt 281 lb 1.6 oz (127.5 kg)   BMI 36.09 kg/m  General:   Alert and oriented. Pleasant and cooperative. Well-nourished and well-developed.  Head:  Normocephalic and atraumatic. Eyes:  Without icterus Abdomen:  +BS, soft, obese, non-distended, no TTP. Periumbilical hernia.  Rectal:  Deferred  Msk:  Symmetrical without gross deformities. Normal posture. Extremities:  Without edema. Neurologic:  Alert and  oriented x4;  grossly normal neurologically. Skin:  Intact without significant lesions or rashes. Psych:  Alert and cooperative. Normal mood and affect.   Assessment   Antonio [name] is a 53 y.o. male presenting today with a history of decompensated cirrhosis complicated by history of HCC, varices, encephalopathy, hepatic hydrothorax, presenting in close follow-up today.   Cirrhosis: due to ETOH. MELD 3.0 was 17 in July 2025, AFP on file. Currently maintained on torsemide  20 mg and spironolactone  25 mg daily. Recent HCC surveillance completed in August by [facility]. This showed interval ablation of segment 5 lesion but new segment lesion with washout, LR-5. Per notes from [facility], no new lesion was noted. I am reaching out to [facility] to further discuss. Prior [facility] s/p ablation in 2023. He is not sure if he wasn't to pursue transplant. Due for surveillance EGD as last was 2023 with Grade 1 varices and had decompensation earlier this year with need for thoracentesis.   In light of prior hx of HE, will continue  lactulose  and add Xifaxan . This was previously not covered by insurance, but I am hopeful we can have this covered in the future.      PLAN    Continue diuretic dosing Add Xifaxan  550 mg BID to regimen due to history of HE Discussing with [facility] most recent imaging Proceed with upper endoscopy by Dr. Cindie in near future: the risks, benefits, and  alternatives have been discussed with the patient in detail. The patient states understanding and desires to proceed.  6 month follow-up regardless   Therisa MICAEL Stager, PhD, ANP-BC [facility]   Apr 20, 2024: After review of [facility] notes, IR has been attempting to get in touch with patient due to new liver lesion. I called [facility] at [phone]. Unable to reach. Also contacted a separate number listed at [phone] but unable to complete. Attempted to call patient to reach out as well, but I was unable to reach. I have sent MyChart message.  2:08 PM

## 2024-02-19 ENCOUNTER — Telehealth (INDEPENDENT_AMBULATORY_CARE_PROVIDER_SITE_OTHER): Payer: Self-pay

## 2024-02-19 NOTE — Telephone Encounter (Signed)
 ATC pt to scheduled EGD, no answer. Vm not set up.

## 2024-02-24 ENCOUNTER — Telehealth: Payer: Self-pay

## 2024-02-24 NOTE — Telephone Encounter (Signed)
 PA done on Cover My Meds for Xifaxan  550 mg tab. Dx used: HE (K76.82). waiting on a response from Cover My Meds.)

## 2024-02-24 NOTE — Telephone Encounter (Signed)
 Pt approved for Xifaxan  550mg  tab through Cover My Meds from 02/24/2024 to 02/23/2025. Pt made aware of this through [employer].

## 2024-03-05 ENCOUNTER — Encounter (INDEPENDENT_AMBULATORY_CARE_PROVIDER_SITE_OTHER): Payer: Self-pay | Admitting: *Deleted

## 2024-03-05 NOTE — Telephone Encounter (Signed)
 Pt has been scheduled for 04/03/24. Instructions mailed.

## 2024-03-06 ENCOUNTER — Encounter: Payer: Self-pay | Admitting: *Deleted

## 2024-03-08 ENCOUNTER — Encounter: Payer: Self-pay | Admitting: Gastroenterology

## 2024-03-08 NOTE — Addendum Note (Signed)
 Addended by: SHIRLEAN THERISA ORN on: 03/08/2024 07:45 PM   Modules accepted: Orders

## 2024-03-31 ENCOUNTER — Encounter (HOSPITAL_COMMUNITY)
Admission: RE | Admit: 2024-03-31 | Discharge: 2024-03-31 | Disposition: A | Discharge status: Home / Self Care | Source: Ambulatory Visit | Attending: Internal Medicine | Admitting: Internal Medicine

## 2024-04-02 NOTE — Anesthesia Preprocedure Evaluation (Signed)
 Anesthesia Evaluation  Patient identified by MRN, date of birth, ID band Patient awake    Reviewed: Allergy & Precautions, H&P , NPO status , Patient's Chart, lab work & pertinent test results, reviewed documented beta blocker date and time   Airway Mallampati: II  TM Distance: >3 FB Neck ROM: full    Dental  (+)    Pulmonary former smoker   Pulmonary exam normal breath sounds clear to auscultation       Cardiovascular +CHF  Normal cardiovascular exam Rhythm:regular Rate:Normal  Old echo from 2017 was normal but no more recent echo in epic   Neuro/Psych Seizures -,  PSYCHIATRIC DISORDERS         GI/Hepatic ,,,(+) Cirrhosis     substance abuse  alcohol use, Hepatitis -Hepatocellular carcinoma History of acute GI bleeding   Endo/Other  negative endocrine ROS    Renal/GU negative Renal ROS  negative genitourinary   Musculoskeletal   Abdominal   Peds  Hematology  (+) Blood dyscrasia, anemia   Anesthesia Other Findings   Reproductive/Obstetrics negative OB ROS                              Anesthesia Physical Anesthesia Plan  ASA: 4  Anesthesia Plan: General   Post-op Pain Management: Minimal or no pain anticipated   Induction: Intravenous  PONV Risk Score and Plan: Propofol  infusion  Airway Management Planned: Natural Airway and Nasal Cannula  Additional Equipment: None  Intra-op Plan:   Post-operative Plan:   Informed Consent: I have reviewed the patients History and Physical, chart, labs and discussed the procedure including the risks, benefits and alternatives for the proposed anesthesia with the patient or authorized representative who has indicated his/her understanding and acceptance.     Dental Advisory Given  Plan Discussed with: CRNA  Anesthesia Plan Comments:          Anesthesia Quick Evaluation

## 2024-04-03 ENCOUNTER — Telehealth: Payer: Self-pay | Admitting: *Deleted

## 2024-04-03 ENCOUNTER — Ambulatory Visit (HOSPITAL_COMMUNITY): Payer: Self-pay | Admitting: Anesthesiology

## 2024-04-03 ENCOUNTER — Encounter (HOSPITAL_COMMUNITY): Payer: Self-pay | Admitting: Internal Medicine

## 2024-04-03 ENCOUNTER — Other Ambulatory Visit: Payer: Self-pay

## 2024-04-03 ENCOUNTER — Ambulatory Visit (HOSPITAL_COMMUNITY)
Admission: RE | Admit: 2024-04-03 | Discharge: 2024-04-03 | Disposition: A | Discharge status: Home / Self Care | Attending: Internal Medicine | Admitting: Internal Medicine

## 2024-04-03 ENCOUNTER — Encounter (HOSPITAL_COMMUNITY): Payer: Self-pay | Admitting: Anesthesiology

## 2024-04-03 ENCOUNTER — Encounter (HOSPITAL_COMMUNITY)
Admission: RE | Disposition: A | Discharge status: Home / Self Care | Payer: Self-pay | Source: Home / Self Care | Attending: Internal Medicine

## 2024-04-03 DIAGNOSIS — M109 Gout, unspecified: Secondary | ICD-10-CM | POA: Diagnosis not present

## 2024-04-03 DIAGNOSIS — K7030 Alcoholic cirrhosis of liver without ascites: Secondary | ICD-10-CM | POA: Diagnosis not present

## 2024-04-03 DIAGNOSIS — I509 Heart failure, unspecified: Secondary | ICD-10-CM | POA: Insufficient documentation

## 2024-04-03 DIAGNOSIS — Z8505 Personal history of malignant neoplasm of liver: Secondary | ICD-10-CM | POA: Diagnosis not present

## 2024-04-03 DIAGNOSIS — I8510 Secondary esophageal varices without bleeding: Secondary | ICD-10-CM | POA: Insufficient documentation

## 2024-04-03 DIAGNOSIS — K3189 Other diseases of stomach and duodenum: Secondary | ICD-10-CM | POA: Diagnosis not present

## 2024-04-03 DIAGNOSIS — Z87891 Personal history of nicotine dependence: Secondary | ICD-10-CM | POA: Insufficient documentation

## 2024-04-03 DIAGNOSIS — I8500 Esophageal varices without bleeding: Secondary | ICD-10-CM

## 2024-04-03 DIAGNOSIS — K766 Portal hypertension: Secondary | ICD-10-CM | POA: Diagnosis not present

## 2024-04-03 DIAGNOSIS — Z79899 Other long term (current) drug therapy: Secondary | ICD-10-CM | POA: Diagnosis not present

## 2024-04-03 DIAGNOSIS — R569 Unspecified convulsions: Secondary | ICD-10-CM | POA: Insufficient documentation

## 2024-04-03 DIAGNOSIS — D649 Anemia, unspecified: Secondary | ICD-10-CM | POA: Insufficient documentation

## 2024-04-03 HISTORY — PX: ESOPHAGOGASTRODUODENOSCOPY: SHX5428

## 2024-04-03 SURGERY — EGD (ESOPHAGOGASTRODUODENOSCOPY)
Anesthesia: General

## 2024-04-03 MED ORDER — LACTATED RINGERS IV SOLN: INTRAVENOUS | Status: DC | PRN

## 2024-04-03 MED ORDER — LIDOCAINE 2% (20 MG/ML) 5 ML SYRINGE
INTRAMUSCULAR | Status: DC | PRN
  Administered 2024-04-03: 60 mg via INTRAVENOUS

## 2024-04-03 MED ORDER — LACTATED RINGERS IV SOLN: INTRAVENOUS | Status: DC

## 2024-04-03 MED ORDER — PROPOFOL 500 MG/50ML IV EMUL
INTRAVENOUS | Status: DC | PRN
  Administered 2024-04-03 (×4): 100 mg via INTRAVENOUS
  Administered 2024-04-03: 50 mg via INTRAVENOUS

## 2024-04-03 MED ORDER — PANTOPRAZOLE SODIUM 40 MG PO TBEC: 40.0000 mg | DELAYED_RELEASE_TABLET | Freq: Two times a day (BID) | ORAL | 11 refills | Status: AC

## 2024-04-03 MED ORDER — DEXMEDETOMIDINE HCL IN NACL 80 MCG/20ML IV SOLN
INTRAVENOUS | Status: DC | PRN
  Administered 2024-04-03: 12 ug via INTRAVENOUS
  Administered 2024-04-03: 8 ug via INTRAVENOUS

## 2024-04-03 NOTE — Discharge Instructions (Addendum)
 EGD Discharge instructions Please read the instructions outlined below and refer to this sheet in the next few weeks. These discharge instructions provide you with general information on caring for yourself after you leave the hospital. Your doctor may also give you specific instructions. While your treatment has been planned according to the most current medical practices available, unavoidable complications occasionally occur. If you have any problems or questions after discharge, please call your doctor. ACTIVITY You may resume your regular activity but move at a slower pace for the next 24 hours.  Take frequent rest periods for the next 24 hours.  Walking will help expel (get rid of) the air and reduce the bloated feeling in your abdomen.  No driving for 24 hours (because of the anesthesia (medicine) used during the test).  You may shower.  Do not sign any important legal documents or operate any machinery for 24 hours (because of the anesthesia used during the test).  NUTRITION Drink plenty of fluids.  You may resume your normal diet.  Begin with a light meal and progress to your normal diet.  Avoid alcoholic beverages for 24 hours or as instructed by your caregiver.  MEDICATIONS You may resume your normal medications unless your caregiver tells you otherwise.  WHAT YOU CAN EXPECT TODAY You may experience abdominal discomfort such as a feeling of fullness or "gas" pains.  FOLLOW-UP Your doctor will discuss the results of your test with you.  SEEK IMMEDIATE MEDICAL ATTENTION IF ANY OF THE FOLLOWING OCCUR: Excessive nausea (feeling sick to your stomach) and/or vomiting.  Severe abdominal pain and distention (swelling).  Trouble swallowing.  Temperature over 101 F (37.8 C).  Rectal bleeding or vomiting of blood.   Your upper endoscopy revealed a large esophageal varix.  This has worsened since previous upper endoscopy.  I banded this today with complete eradication of the varix.   Stomach with portal hypertensive gastropathy.  Duodenum normal.  I am going to start you on pantoprazole  40 mg twice daily.  Keep your diet soft for a few days.  You may have some discomfort from banding today.  Repeat upper endoscopy in 4 weeks.  My office will call you to arrange this.   I hope you have a great rest of your week!  Antonio [name]. Antonio [name], D.O. Gastroenterology and Hepatology [facility]

## 2024-04-03 NOTE — Anesthesia Postprocedure Evaluation (Signed)
 Anesthesia Post Note  Patient: Antonio [name]  Procedure(s) Performed: EGD (ESOPHAGOGASTRODUODENOSCOPY)  Patient location during evaluation: Phase II Anesthesia Type: General Level of consciousness: awake and alert Pain management: pain level controlled Vital Signs Assessment: post-procedure vital signs reviewed and stable Respiratory status: spontaneous breathing, nonlabored ventilation and respiratory function stable Cardiovascular status: stable Anesthetic complications: no   There were no known notable events for this encounter.   Last Vitals:  Vitals:   04/03/24 0656 04/03/24 0755  BP: 118/76 (!) 97/51  Pulse: 61 67  Resp: 15 (!) 22  Temp: 36.8 C 36.8 C  SpO2: 100% 100%    Last Pain:  Vitals:   04/03/24 0755  TempSrc: Oral  PainSc: 0-No pain                 Hines Kloss L Dalonda Simoni

## 2024-04-03 NOTE — Telephone Encounter (Signed)
 Per Dr. Cindie needs repeat EGD +/- banding in 4 weeks, ASA 3

## 2024-04-03 NOTE — Transfer of Care (Signed)
 Immediate Anesthesia Transfer of Care Note  Patient: Antonio [name]  Procedure(s) Performed: EGD (ESOPHAGOGASTRODUODENOSCOPY)  Patient Location: Short Stay  Anesthesia Type:General  Level of Consciousness: awake  Airway & Oxygen Therapy: Patient Spontanous Breathing  Post-op Assessment: Report given to RN and Post -op Vital signs reviewed and stable  Post vital signs: Reviewed and stable  Last Vitals:  Vitals Value Taken Time  BP 97/51 04/03/24 07:55  Temp 36.8 C 04/03/24 07:55  Pulse 67 04/03/24 07:55  Resp 22 04/03/24 07:55  SpO2 100 % 04/03/24 07:55    Last Pain:  Vitals:   04/03/24 0755  TempSrc: Oral  PainSc: 0-No pain         Complications: No notable events documented.

## 2024-04-03 NOTE — Op Note (Signed)
 [facility] Patient Name: Antonio [name] Procedure Date: 04/03/2024 7:11 AM MRN: [id number] Date of Birth: Oct 26, 1970 Attending MD: Carlin POUR. Cindie , [place], [id number] CSN: [id number] Age: 53 Admit Type: Outpatient Procedure:                Upper GI endoscopy Indications:              Follow-up of esophageal varices, patient intolerant                            to NSBB Providers:                Carlin POUR. Cindie, DO, Leandrew Edelman RN, RN, Bascom Blush Referring MD:              Medicines:                See the Anesthesia note for documentation of the                            administered medications Complications:            No immediate complications. Estimated Blood Loss:     Estimated blood loss: none. Procedure:                Pre-Anesthesia Assessment:                           - The anesthesia plan was to use monitored                            anesthesia care (MAC).                           After obtaining informed consent, the endoscope was                            passed under direct vision. Throughout the                            procedure, the patient's blood pressure, pulse, and                            oxygen saturations were monitored continuously. The                            [id number] ([id number]) Upper was introduced through                            the mouth, and advanced to the second part of                            duodenum. The upper GI endoscopy was accomplished                            without difficulty. The patient tolerated the  procedure well. Scope In: 7:43:10 AM Scope Out: 7:51:13 AM Total Procedure Duration: 0 hours 8 minutes 3 seconds  Findings:      One column of grade II, large (> 5 mm) varices with no bleeding and no       stigmata of recent bleeding were found in the lower third of the       esophagus. No red wale signs were present. One band was successfully       placed  with complete eradication, resulting in deflation of varices.       There was no bleeding at the end of the procedure.      Moderate portal hypertensive gastropathy was found in the entire       examined stomach.      The duodenal bulb, first portion of the duodenum and second portion of       the duodenum were normal. Impression:               - Grade II and large (> 5 mm) esophageal varices                            with no bleeding and no stigmata of recent                            bleeding. Completely eradicated. Banded.                           - Portal hypertensive gastropathy.                           - Normal duodenal bulb, first portion of the                            duodenum and second portion of the duodenum.                           - No specimens collected. Moderate Sedation:      Per Anesthesia Care Recommendation:           - Soft diet.                           - Use a proton pump inhibitor PO BID.                           - Repeat upper endoscopy in 4 weeks to evaluate the                            response to therapy. Procedure Code(s):        --- Professional ---                           [phone], Esophagogastroduodenoscopy, flexible,                            transoral; with band ligation of esophageal/gastric  varices Diagnosis Code(s):        --- Professional ---                           I85.00, Esophageal varices without bleeding                           K76.6, Portal hypertension                           K31.89, Other diseases of stomach and duodenum CPT copyright 2022 American Medical Association. All rights reserved. The codes documented in this report are preliminary and upon coder review may  be revised to meet current compliance requirements. Carlin POUR. Cindie, DO Carlin POUR. Cindie, DO 04/03/2024 7:57:56 AM This report has been signed electronically. Number of Addenda: 0

## 2024-04-03 NOTE — H&P (Signed)
 Primary Care Physician:  Maree Isles, MD Primary Gastroenterologist:  Dr. Cindie  Pre-Procedure History & Physical: HPI:  Antonio [name] is a 53 y.o. male is here for an EGD to be performed for cirrhosis, history of esophageal varices.   Past Medical History:  Diagnosis Date   Alcohol abuse    Alcoholic hepatitis (HCC)    Cirrhosis with alcoholism (HCC)    completed Hep A and B vaccinations in 2017   Hepatocellular carcinoma (HCC)    History of gout     Past Surgical History:  Procedure Laterality Date   BIOPSY  03/08/2016   Procedure: BIOPSY;  Surgeon: Lamar CHRISTELLA Hollingshead, MD;  [facility];  Service: Endoscopy;;  descending colon biopsies   BIOPSY  04/14/2019   Procedure: BIOPSY;  Surgeon: Harvey Margo LITTIE, MD;  [facility];  Service: Endoscopy;;   BIOPSY  08/21/2021   Procedure: BIOPSY;  Surgeon: Cindie Carlin POUR, DO;  [facility];  Service: Endoscopy;;   Colonoscopy     per patient around 2014 in [place]    COLONOSCOPY WITH PROPOFOL  [name] 03/08/2016   One 8 mm polyp in the descending colon, removed with a hot snare. Resected and Retrieved.One 4 mm polyp in the cecum, removed with a cold snare. Resected and retrieved. Rectal varices. Melanosis coli. Abnormal left colon mucosa status post biopsy. Tubular adenomas. Surveillance was due 2022.   COLONOSCOPY WITH PROPOFOL  [name] 08/21/2021   Procedure: COLONOSCOPY WITH PROPOFOL ;  Surgeon: Cindie Carlin POUR, DO;  [facility];  Service: Endoscopy;  Laterality: [name];  11:15am   ESOPHAGOGASTRODUODENOSCOPY (EGD) WITH PROPOFOL  [name] 12/28/2015   Dr. harvey: 4 columns of esophageal varices, 3 grade 1, 1 grade 2 with no evidence of bleeding. Mild portal gastropathy with no evidence of active bleeding.   ESOPHAGOGASTRODUODENOSCOPY (EGD) WITH PROPOFOL  [name] 03/08/2016   Procedure: ESOPHAGOGASTRODUODENOSCOPY (EGD) WITH PROPOFOL ;  Surgeon: Lamar CHRISTELLA Hollingshead, MD;  [facility];  Service: Endoscopy;  Laterality:  [name];   ESOPHAGOGASTRODUODENOSCOPY (EGD) WITH PROPOFOL  [name] 04/14/2019   Grade 1 and 2 esophageal varices without bleeding and no stigmata of bleeding. moderate portal gastropath, mild erosive gastritis. Path with positive H.pylori   ESOPHAGOGASTRODUODENOSCOPY (EGD) WITH PROPOFOL  [name] 08/21/2021   Procedure: ESOPHAGOGASTRODUODENOSCOPY (EGD) WITH PROPOFOL ;  Surgeon: Cindie Carlin POUR, DO;  [facility];  Service: Endoscopy;  Laterality: [name];   GIVENS CAPSULE STUDY [name] 07/05/2017   Procedure: GIVENS CAPSULE STUDY;  Surgeon: Harvey Margo LITTIE, MD;  [facility];  Service: Endoscopy;  Laterality: [name];  7:30am   GIVENS CAPSULE STUDY [name] 08/05/2017   Procedure: GIVENS CAPSULE STUDY;  Surgeon: Harvey Margo LITTIE, MD;  [facility];  Service: Endoscopy;  Laterality: [name];  7:30am   ORIF ANKLE FRACTURE Right 06/02/2021   Procedure: OPEN REDUCTION INTERNAL FIXATION (ORIF) ANKLE FRACTURE;  Surgeon: Margrette Taft BRAVO, MD;  [facility]: [facility];  Service: Orthopedics;  Laterality: Right;   POLYPECTOMY  03/08/2016   Procedure: POLYPECTOMY;  Surgeon: Lamar CHRISTELLA Hollingshead, MD;  [facility];  Service: Endoscopy;;  cecum and descending    POLYPECTOMY  08/21/2021   Procedure: POLYPECTOMY INTESTINAL;  Surgeon: Cindie Carlin POUR, DO;  [facility];  Service: Endoscopy;;    Prior to Admission medications   Medication Sig Start Date End Date Taking? Authorizing Provider  lactulose  (CHRONULAC ) 10 GM/15ML solution Take 15 mLs (10 g total) by mouth as needed for moderate constipation. 10/30/23  Yes Ricky Fines,  MD  rifaximin  (XIFAXAN ) 550 MG TABS tablet Take 1 tablet (550 mg total) by mouth 2 (two) times daily. 02/18/24  Yes Shirlean Therisa ORN, NP  spironolactone  (ALDACTONE ) 25 MG tablet Take 1 tablet (25 mg total) by mouth daily. 12/05/23  Yes Shirlean Therisa ORN, NP  torsemide  (DEMADEX ) 20 MG tablet TAKE 1 TABLET BY MOUTH DAILY TAKE 1/2 TABLE WHEN PATIENT HAS EXTRA SWELLING 05/12/19  Yes  Shirlean Therisa ORN, NP    Allergies as of 03/05/2024   (No Known Allergies)    Family History  Problem Relation Age of Onset   Colon cancer Neg Hx    Liver disease Neg Hx     Social History   Socioeconomic History   Marital status: Married    Spouse name: Not on file   Number of children: Not on file   Years of education: Not on file   Highest education level: Not on file  Occupational History   Not on file  Tobacco Use   Smoking status: Former    Current packs/day: 0.00    Average packs/day: 0.3 packs/day for 8.0 years (2.0 ttl pk-yrs)    Types: Cigarettes    Start date: 2008    Quit date: 2016    Years since quitting: 9.8    Passive exposure: Current   Smokeless tobacco: Never  Vaping Use   Vaping status: Never Used  Substance and Sexual Activity   Alcohol use: No    Comment: former--quit after 12/2015 hospitalization   Drug use: No   Sexual activity: Yes  Other Topics Concern   Not on file  Social History Narrative   MARRIED. 2 KIDS-7 GRANDBABIES. SPENDS FREE TIME: WITH GRANDCHILDREN, AND WATCHING TV.   Social Drivers of Corporate Investment Banker Strain: Not on file  Food Insecurity: No Food Insecurity (10/28/2023)   Hunger Vital Sign    Worried About Running Out of Food in the Last Year: Never true    Ran Out of Food in the Last Year: Never true  Transportation Needs: No Transportation Needs (10/28/2023)   PRAPARE - Administrator, Civil Service (Medical): No    Lack of Transportation (Non-Medical): No  Physical Activity: Not on file  Stress: Not on file  Social Connections: Not on file  Intimate Partner Violence: Not At Risk (10/28/2023)   Humiliation, Afraid, Rape, and Kick questionnaire    Fear of Current or Ex-Partner: No    Emotionally Abused: No    Physically Abused: No    Sexually Abused: No    Review of Systems: General: Negative for fever, chills, fatigue, weakness. Eyes: Negative for vision changes.  ENT: Negative for hoarseness,  difficulty swallowing , nasal congestion. CV: Negative for chest pain, angina, palpitations, dyspnea on exertion, peripheral edema.  Respiratory: Negative for dyspnea at rest, dyspnea on exertion, cough, sputum, wheezing.  GI: See history of present illness. GU:  Negative for dysuria, hematuria, urinary incontinence, urinary frequency, nocturnal urination.  MS: Negative for joint pain, low back pain.  Derm: Negative for rash or itching.  Neuro: Negative for weakness, abnormal sensation, seizure, frequent headaches, memory loss, confusion.  Psych: Negative for anxiety, depression Endo: Negative for unusual weight change.  Heme: Negative for bruising or bleeding. Allergy: Negative for rash or hives.  Physical Exam: Vital signs in last 24 hours: Temp:  [98.3 F (36.8 C)] 98.3 F (36.8 C) (11/07 0656) Pulse Rate:  [61] 61 (11/07 0656) Resp:  [15] 15 (11/07 0656) BP: (118)/(76) 118/76 (  11/07 0656) SpO2:  [100 %] 100 % (11/07 0656) Weight:  [124.7 kg] 124.7 kg (11/07 0656)   General:   Alert,  Well-developed, well-nourished, pleasant and cooperative in NAD Head:  Normocephalic and atraumatic. Eyes:  Sclera clear, no icterus.   Conjunctiva pink. Ears:  Normal auditory acuity. Nose:  No deformity, discharge,  or lesions. Msk:  Symmetrical without gross deformities. Normal posture. Extremities:  Without clubbing or edema. Neurologic:  Alert and  oriented x4;  grossly normal neurologically. Skin:  Intact without significant lesions or rashes. Psych:  Alert and cooperative. Normal mood and affect.   Impression/Plan: Antonio [name] is here for an EGD to be performed for cirrhosis, history of esophageal varices.   Risks, benefits, limitations, imponderables and alternatives regarding procedure have been reviewed with the patient. Questions have been answered. All parties agreeable.

## 2024-04-06 ENCOUNTER — Encounter (HOSPITAL_COMMUNITY): Payer: Self-pay | Admitting: Internal Medicine

## 2024-04-06 NOTE — Telephone Encounter (Signed)
 Spoke with pt to schedule. He can't do any Mondays. Scheduled for 12/19. Aware will mail instructions to him.   Checked [employer] and no PA required

## 2024-05-11 ENCOUNTER — Encounter (HOSPITAL_COMMUNITY)
Admission: RE | Admit: 2024-05-11 | Discharge: 2024-05-11 | Disposition: A | Source: Ambulatory Visit | Attending: Internal Medicine

## 2024-05-11 NOTE — Pre-Procedure Instructions (Signed)
 Attempted pre-op phone call. Patient does not have a VM set up, could not leave a message.

## 2024-05-15 ENCOUNTER — Ambulatory Visit (HOSPITAL_COMMUNITY): Admitting: Certified Registered"

## 2024-05-15 ENCOUNTER — Encounter (HOSPITAL_COMMUNITY): Admission: RE | Payer: Self-pay

## 2024-05-15 ENCOUNTER — Ambulatory Visit (HOSPITAL_COMMUNITY)
Admission: RE | Admit: 2024-05-15 | Discharge: 2024-05-15 | Disposition: A | Discharge status: Home / Self Care | Attending: Internal Medicine | Admitting: Internal Medicine

## 2024-05-15 DIAGNOSIS — K3189 Other diseases of stomach and duodenum: Secondary | ICD-10-CM

## 2024-05-15 DIAGNOSIS — K766 Portal hypertension: Secondary | ICD-10-CM | POA: Diagnosis not present

## 2024-05-15 DIAGNOSIS — K7460 Unspecified cirrhosis of liver: Secondary | ICD-10-CM | POA: Diagnosis not present

## 2024-05-15 DIAGNOSIS — Z87891 Personal history of nicotine dependence: Secondary | ICD-10-CM | POA: Diagnosis not present

## 2024-05-15 DIAGNOSIS — Z8505 Personal history of malignant neoplasm of liver: Secondary | ICD-10-CM | POA: Insufficient documentation

## 2024-05-15 DIAGNOSIS — I8500 Esophageal varices without bleeding: Secondary | ICD-10-CM

## 2024-05-15 DIAGNOSIS — R569 Unspecified convulsions: Secondary | ICD-10-CM | POA: Insufficient documentation

## 2024-05-15 DIAGNOSIS — Z09 Encounter for follow-up examination after completed treatment for conditions other than malignant neoplasm: Secondary | ICD-10-CM | POA: Diagnosis present

## 2024-05-15 DIAGNOSIS — I509 Heart failure, unspecified: Secondary | ICD-10-CM | POA: Insufficient documentation

## 2024-05-15 DIAGNOSIS — K7030 Alcoholic cirrhosis of liver without ascites: Secondary | ICD-10-CM | POA: Diagnosis not present

## 2024-05-15 HISTORY — PX: ESOPHAGOGASTRODUODENOSCOPY: SHX5428

## 2024-05-15 HISTORY — PX: ESOPHAGEAL BANDING: SHX5518

## 2024-05-15 SURGERY — EGD (ESOPHAGOGASTRODUODENOSCOPY)
Anesthesia: General

## 2024-05-15 MED ORDER — LIDOCAINE HCL (CARDIAC) PF 100 MG/5ML IV SOSY
PREFILLED_SYRINGE | INTRAVENOUS | Status: DC | PRN
  Administered 2024-05-15: 50 mg via INTRAVENOUS

## 2024-05-15 MED ORDER — PROPOFOL 10 MG/ML IV BOLUS
INTRAVENOUS | Status: DC | PRN
  Administered 2024-05-15 (×2): 100 mg via INTRAVENOUS
  Administered 2024-05-15: 50 mg via INTRAVENOUS
  Administered 2024-05-15: 100 mg via INTRAVENOUS

## 2024-05-15 MED ORDER — LACTATED RINGERS IV SOLN: INTRAVENOUS | Status: DC

## 2024-05-15 MED ORDER — ETOMIDATE 2 MG/ML IV SOLN
INTRAVENOUS | Status: DC | PRN
  Administered 2024-05-15: 10 mg via INTRAVENOUS

## 2024-05-15 NOTE — Anesthesia Postprocedure Evaluation (Signed)
"   Anesthesia Post Note  Patient: Antonio [name]  Procedure(s) Performed: EGD (ESOPHAGOGASTRODUODENOSCOPY) ESOPHAGOSCOPY, WITH ESOPHAGEAL VARICES BAND LIGATION  Patient location during evaluation: Phase II Anesthesia Type: General Level of consciousness: awake Pain management: pain level controlled Vital Signs Assessment: post-procedure vital signs reviewed and stable Respiratory status: spontaneous breathing and respiratory function stable Cardiovascular status: blood pressure returned to baseline and stable Postop Assessment: no headache and no apparent nausea or vomiting Anesthetic complications: no Comments: Late entry   No notable events documented.   Last Vitals:  Vitals:   05/15/24 0640 05/15/24 0839  BP: 123/70 115/67  Pulse: 62 78  Resp: 16 18  Temp: 36.8 C 36.7 C  SpO2: 100% 100%    Last Pain:  Vitals:   05/15/24 0839  TempSrc: Oral  PainSc: 0-No pain                 Yvonna PARAS Rayelynn Loyal      "

## 2024-05-15 NOTE — Anesthesia Preprocedure Evaluation (Signed)
 Anesthesia Evaluation  Patient identified by MRN, date of birth, ID band Patient awake    Reviewed: Allergy & Precautions, H&P , NPO status , Patient's Chart, lab work & pertinent test results, reviewed documented beta blocker date and time   Airway Mallampati: II  TM Distance: >3 FB Neck ROM: full    Dental  (+)    Pulmonary former smoker   Pulmonary exam normal breath sounds clear to auscultation       Cardiovascular +CHF  Normal cardiovascular exam Rhythm:regular Rate:Normal  Old echo from 2017 was normal but no more recent echo in epic   Neuro/Psych Seizures -,  PSYCHIATRIC DISORDERS         GI/Hepatic ,,,(+) Cirrhosis     substance abuse  alcohol use, Hepatitis -Hepatocellular carcinoma History of acute GI bleeding   Endo/Other  negative endocrine ROS    Renal/GU negative Renal ROS  negative genitourinary   Musculoskeletal   Abdominal   Peds  Hematology  (+) Blood dyscrasia, anemia   Anesthesia Other Findings   Reproductive/Obstetrics negative OB ROS                              Anesthesia Physical Anesthesia Plan  ASA: 4  Anesthesia Plan: General   Post-op Pain Management: Minimal or no pain anticipated   Induction: Intravenous  PONV Risk Score and Plan: Propofol  infusion  Airway Management Planned: Natural Airway and Nasal Cannula  Additional Equipment: None  Intra-op Plan:   Post-operative Plan:   Informed Consent: I have reviewed the patients History and Physical, chart, labs and discussed the procedure including the risks, benefits and alternatives for the proposed anesthesia with the patient or authorized representative who has indicated his/her understanding and acceptance.     Dental Advisory Given  Plan Discussed with: CRNA  Anesthesia Plan Comments:          Anesthesia Quick Evaluation

## 2024-05-15 NOTE — H&P (Signed)
 Primary Care Physician:  Maree Isles, MD Primary Gastroenterologist:  Dr. Cindie  Pre-Procedure History & Physical: HPI:  Antonio [name] is a 53 y.o. male is here for an EGD to be performed for cirrhosis, esophageal varices with possible band ligation.   Past Medical History:  Diagnosis Date   Alcohol abuse    Alcoholic hepatitis (HCC)    Cirrhosis with alcoholism (HCC)    completed Hep A and B vaccinations in 2017   Hepatocellular carcinoma (HCC)    History of gout     Past Surgical History:  Procedure Laterality Date   BIOPSY  03/08/2016   Procedure: BIOPSY;  Surgeon: Lamar CHRISTELLA Hollingshead, MD;  [facility];  Service: Endoscopy;;  descending colon biopsies   BIOPSY  04/14/2019   Procedure: BIOPSY;  Surgeon: Harvey Margo LITTIE, MD;  [facility];  Service: Endoscopy;;   BIOPSY  08/21/2021   Procedure: BIOPSY;  Surgeon: Cindie Carlin POUR, DO;  [facility];  Service: Endoscopy;;   Colonoscopy     per patient around 2014 in [place]    COLONOSCOPY WITH PROPOFOL  [name] 03/08/2016   One 8 mm polyp in the descending colon, removed with a hot snare. Resected and Retrieved.One 4 mm polyp in the cecum, removed with a cold snare. Resected and retrieved. Rectal varices. Melanosis coli. Abnormal left colon mucosa status post biopsy. Tubular adenomas. Surveillance was due 2022.   COLONOSCOPY WITH PROPOFOL  [name] 08/21/2021   Procedure: COLONOSCOPY WITH PROPOFOL ;  Surgeon: Cindie Carlin POUR, DO;  [facility];  Service: Endoscopy;  Laterality: [name];  11:15am   ESOPHAGOGASTRODUODENOSCOPY [name] 04/03/2024   Procedure: EGD (ESOPHAGOGASTRODUODENOSCOPY);  Surgeon: Cindie Carlin POUR, DO;  [facility];  Service: Endoscopy;  Laterality: [name];  7:30 am, asa 3   ESOPHAGOGASTRODUODENOSCOPY (EGD) WITH PROPOFOL  [name] 12/28/2015   Dr. harvey: 4 columns of esophageal varices, 3 grade 1, 1 grade 2 with no evidence of bleeding. Mild portal gastropathy with no evidence of active  bleeding.   ESOPHAGOGASTRODUODENOSCOPY (EGD) WITH PROPOFOL  [name] 03/08/2016   Procedure: ESOPHAGOGASTRODUODENOSCOPY (EGD) WITH PROPOFOL ;  Surgeon: Lamar CHRISTELLA Hollingshead, MD;  [facility];  Service: Endoscopy;  Laterality: [name];   ESOPHAGOGASTRODUODENOSCOPY (EGD) WITH PROPOFOL  [name] 04/14/2019   Grade 1 and 2 esophageal varices without bleeding and no stigmata of bleeding. moderate portal gastropath, mild erosive gastritis. Path with positive H.pylori   ESOPHAGOGASTRODUODENOSCOPY (EGD) WITH PROPOFOL  [name] 08/21/2021   Procedure: ESOPHAGOGASTRODUODENOSCOPY (EGD) WITH PROPOFOL ;  Surgeon: Cindie Carlin POUR, DO;  [facility];  Service: Endoscopy;  Laterality: [name];   GIVENS CAPSULE STUDY [name] 07/05/2017   Procedure: GIVENS CAPSULE STUDY;  Surgeon: Harvey Margo LITTIE, MD;  [facility];  Service: Endoscopy;  Laterality: [name];  7:30am   GIVENS CAPSULE STUDY [name] 08/05/2017   Procedure: GIVENS CAPSULE STUDY;  Surgeon: Harvey Margo LITTIE, MD;  [facility];  Service: Endoscopy;  Laterality: [name];  7:30am   ORIF ANKLE FRACTURE Right 06/02/2021   Procedure: OPEN REDUCTION INTERNAL FIXATION (ORIF) ANKLE FRACTURE;  Surgeon: Margrette Taft BRAVO, MD;  [facility]: [facility];  Service: Orthopedics;  Laterality: Right;   POLYPECTOMY  03/08/2016   Procedure: POLYPECTOMY;  Surgeon: Lamar CHRISTELLA Hollingshead, MD;  [facility];  Service: Endoscopy;;  cecum and descending    POLYPECTOMY  08/21/2021   Procedure: POLYPECTOMY INTESTINAL;  Surgeon: Cindie Carlin POUR, DO;  [facility];  Service: Endoscopy;;    Prior to Admission medications  Medication Sig Start Date End Date Taking? Authorizing Provider  pantoprazole  (PROTONIX ) 40 MG tablet Take 1 tablet (40 mg total) by mouth 2 (two) times daily. 04/03/24 04/03/25 Yes Velera Lansdale, Carlin POUR, DO  rifaximin  (XIFAXAN ) 550 MG TABS tablet Take 1 tablet (550 mg total) by mouth 2 (two) times daily. 02/18/24  Yes Shirlean Therisa ORN, NP  spironolactone  (ALDACTONE )  25 MG tablet Take 1 tablet (25 mg total) by mouth daily. 12/05/23  Yes Shirlean Therisa ORN, NP  torsemide  (DEMADEX ) 20 MG tablet TAKE 1 TABLET BY MOUTH DAILY TAKE 1/2 TABLE WHEN PATIENT HAS EXTRA SWELLING 05/12/19  Yes Shirlean Therisa ORN, NP  lactulose  (CHRONULAC ) 10 GM/15ML solution Take 15 mLs (10 g total) by mouth as needed for moderate constipation. 10/30/23   Ricky Fines, MD    Allergies as of 04/06/2024   (No Known Allergies)    Family History  Problem Relation Age of Onset   Colon cancer Neg Hx    Liver disease Neg Hx     Social History   Socioeconomic History   Marital status: Married    Spouse name: Not on file   Number of children: Not on file   Years of education: Not on file   Highest education level: Not on file  Occupational History   Not on file  Tobacco Use   Smoking status: Former    Current packs/day: 0.00    Average packs/day: 0.3 packs/day for 8.0 years (2.0 ttl pk-yrs)    Types: Cigarettes    Start date: 2008    Quit date: 2016    Years since quitting: 9.9    Passive exposure: Current   Smokeless tobacco: Never  Vaping Use   Vaping status: Never Used  Substance and Sexual Activity   Alcohol use: No    Comment: former--quit after 12/2015 hospitalization   Drug use: No   Sexual activity: Yes  Other Topics Concern   Not on file  Social History Narrative   MARRIED. 2 KIDS-7 GRANDBABIES. SPENDS FREE TIME: WITH GRANDCHILDREN, AND WATCHING TV.   Social Drivers of Health   Tobacco Use: Medium Risk (04/03/2024)   Patient History    Smoking Tobacco Use: Former    Smokeless Tobacco Use: Never    Passive Exposure: Current  Physicist, Medical Strain: Not on file  Food Insecurity: No Food Insecurity (10/28/2023)   Hunger Vital Sign    Worried About Running Out of Food in the Last Year: Never true    Ran Out of Food in the Last Year: Never true  Transportation Needs: No Transportation Needs (10/28/2023)   PRAPARE - Administrator, Civil Service (Medical):  No    Lack of Transportation (Non-Medical): No  Physical Activity: Not on file  Stress: Not on file  Social Connections: Not on file  Intimate Partner Violence: Not At Risk (10/28/2023)   Humiliation, Afraid, Rape, and Kick questionnaire    Fear of Current or Ex-Partner: No    Emotionally Abused: No    Physically Abused: No    Sexually Abused: No  Depression (PHQ2-9): Not on file  Alcohol Screen: Not on file  Housing: Unknown (11/19/2023)   Received from [facility]   Epic    Unable to Pay for Housing in the Last Year: Not on file    Number of Times Moved in the Last Year: Not on file    At any time in the past 12 months, were you homeless or living in a shelter (  including now)?: No  Utilities: Not At Risk (10/28/2023)   [facility] Utilities    Threatened with loss of utilities: No  Health Literacy: Not on file    Review of Systems: General: Negative for fever, chills, fatigue, weakness. Eyes: Negative for vision changes.  ENT: Negative for hoarseness, difficulty swallowing , nasal congestion. CV: Negative for chest pain, angina, palpitations, dyspnea on exertion, peripheral edema.  Respiratory: Negative for dyspnea at rest, dyspnea on exertion, cough, sputum, wheezing.  GI: See history of present illness. GU:  Negative for dysuria, hematuria, urinary incontinence, urinary frequency, nocturnal urination.  MS: Negative for joint pain, low back pain.  Derm: Negative for rash or itching.  Neuro: Negative for weakness, abnormal sensation, seizure, frequent headaches, memory loss, confusion.  Psych: Negative for anxiety, depression Endo: Negative for unusual weight change.  Heme: Negative for bruising or bleeding. Allergy: Negative for rash or hives.  Physical Exam: Vital signs in last 24 hours: Temp:  [98.2 F (36.8 C)] 98.2 F (36.8 C) (12/19 0640) Pulse Rate:  [62] 62 (12/19 0640) Resp:  [16] 16 (12/19 0640) BP: (123)/(70) 123/70 (12/19 0640) SpO2:  [100 %] 100 %  (12/19 0640)   General:   Alert,  Well-developed, well-nourished, pleasant and cooperative in NAD Head:  Normocephalic and atraumatic. Eyes:  Sclera clear, no icterus.   Conjunctiva pink. Ears:  Normal auditory acuity. Nose:  No deformity, discharge,  or lesions. Msk:  Symmetrical without gross deformities. Normal posture. Extremities:  Without clubbing or edema. Neurologic:  Alert and  oriented x4;  grossly normal neurologically. Skin:  Intact without significant lesions or rashes. Psych:  Alert and cooperative. Normal mood and affect.   Impression/Plan: Antonio [name] is here for an EGD to be performed for cirrhosis, esophageal varices with possible band ligation.   Risks, benefits, limitations, imponderables and alternatives regarding procedure have been reviewed with the patient. Questions have been answered. All parties agreeable.

## 2024-05-15 NOTE — Op Note (Signed)
 [facility] Patient Name: Antonio [name] Procedure Date: 05/15/2024 6:58 AM MRN: [id number] Date of Birth: [birth year] Attending MD: Carlin POUR. Cindie , [place], [id number] CSN: [id number] Age: 53 Admit Type: Ambulatory Procedure:                Upper GI endoscopy Indications:              Esophageal varices, Follow-up of esophageal varices Providers:                Carlin POUR. Cindie, DO, Tammy Vaught, RN, Bascom Blush Referring MD:              Medicines:                See the Anesthesia note for documentation of the                            administered medications Complications:            No immediate complications. Estimated Blood Loss:     Estimated blood loss: none. Procedure:                Pre-Anesthesia Assessment:                           - The anesthesia plan was to use monitored                            anesthesia care (MAC).                           After obtaining informed consent, the endoscope was                            passed under direct vision. Throughout the                            procedure, the patient's blood pressure, pulse, and                            oxygen saturations were monitored continuously. The                            Endoscope was introduced through the mouth, and                            advanced to the second part of duodenum. The upper                            GI endoscopy was accomplished without difficulty.                            The patient tolerated the procedure well. Scope In: 8:30:51 AM Scope Out: 8:34:39 AM Total Procedure Duration: 0 hours 3 minutes 48 seconds  Findings:      One column of grade I varices with  no bleeding and no stigmata of recent       bleeding were found in the lower third of the esophagus. No red wale       signs were present. Scarring from prior treatment was visible.      Moderate portal hypertensive gastropathy was found in the entire       examined stomach.       The duodenal bulb, first portion of the duodenum and second portion of       the duodenum were normal. Impression:               - Grade I esophageal varices with no bleeding and                            no stigmata of recent bleeding.                           - Portal hypertensive gastropathy.                           - Normal duodenal bulb, first portion of the                            duodenum and second portion of the duodenum.                           - No specimens collected. Moderate Sedation:      Per Anesthesia Care Recommendation:           - Patient has a contact number available for                            emergencies. The signs and symptoms of potential                            delayed complications were discussed with the                            patient. Return to normal activities tomorrow.                            Written discharge instructions were provided to the                            patient.                           - Low sodium diet.                           - Continue present medications.                           - Repeat upper endoscopy in 6 months for                            surveillance.                           -  Return to GI clinic in 8 weeks.                           - Patient previously did not tolerate Nadolol .                            Could consider starting on low dose carvedilol and                            titrating up. Procedure Code(s):        --- Professional ---                           [phone], Esophagogastroduodenoscopy, flexible,                            transoral; diagnostic, including collection of                            specimen(s) by brushing or washing, when performed                            (separate procedure) Diagnosis Code(s):        --- Professional ---                           I85.00, Esophageal varices without bleeding                           K76.6, Portal hypertension                            K31.89, Other diseases of stomach and duodenum CPT copyright 2022 American Medical Association. All rights reserved. The codes documented in this report are preliminary and upon coder review may  be revised to meet current compliance requirements. Carlin POUR. Cindie, DO Carlin POUR. Cindie, DO 05/15/2024 8:45:19 AM This report has been signed electronically. Number of Addenda: 0

## 2024-05-15 NOTE — Transfer of Care (Signed)
 Immediate Anesthesia Transfer of Care Note  Patient: Cara LITTIE Sharps  Procedure(s) Performed: EGD (ESOPHAGOGASTRODUODENOSCOPY) ESOPHAGOSCOPY, WITH ESOPHAGEAL VARICES BAND LIGATION  Patient Location: Short Stay  Anesthesia Type:General  Level of Consciousness: drowsy, patient cooperative, and responds to stimulation  Airway & Oxygen Therapy: Patient Spontanous Breathing  Post-op Assessment: Report given to RN and Post -op Vital signs reviewed and stable  Post vital signs: Reviewed and stable  Last Vitals:  Vitals Value Taken Time  BP 115/67 05/15/24 08:39  Temp 36.7 C 05/15/24 08:39  Pulse 78 05/15/24 08:39  Resp 18 05/15/24 08:39  SpO2 100 % 05/15/24 08:39    Last Pain:  Vitals:   05/15/24 0839  TempSrc: Oral  PainSc: 0-No pain      Patients Stated Pain Goal: 7 (05/15/24 0839)  Complications: No notable events documented.

## 2024-05-15 NOTE — Discharge Instructions (Signed)
 EGD Discharge instructions Please read the instructions outlined below and refer to this sheet in the next few weeks. These discharge instructions provide you with general information on caring for yourself after you leave the hospital. Your doctor may also give you specific instructions. While your treatment has been planned according to the most current medical practices available, unavoidable complications occasionally occur. If you have any problems or questions after discharge, please call your doctor. ACTIVITY You may resume your regular activity but move at a slower pace for the next 24 hours.  Take frequent rest periods for the next 24 hours.  Walking will help expel (get rid of) the air and reduce the bloated feeling in your abdomen.  No driving for 24 hours (because of the anesthesia (medicine) used during the test).  You may shower.  Do not sign any important legal documents or operate any machinery for 24 hours (because of the anesthesia used during the test).  NUTRITION Drink plenty of fluids.  You may resume your normal diet.  Begin with a light meal and progress to your normal diet.  Avoid alcoholic beverages for 24 hours or as instructed by your caregiver.  MEDICATIONS You may resume your normal medications unless your caregiver tells you otherwise.  WHAT YOU CAN EXPECT TODAY You may experience abdominal discomfort such as a feeling of fullness or gas pains.  FOLLOW-UP Your doctor will discuss the results of your test with you.  SEEK IMMEDIATE MEDICAL ATTENTION IF ANY OF THE FOLLOWING OCCUR: Excessive nausea (feeling sick to your stomach) and/or vomiting.  Severe abdominal pain and distention (swelling).  Trouble swallowing.  Temperature over 101 F (37.8 C).  Rectal bleeding or vomiting of blood.    Your upper endoscopy revealed esophageal varices have improved status post banding.  I did not need to place further bands today.  Repeat upper endoscopy in 6 months.   Continue current medications.  Follow-up in GI office in 6 to 8 weeks.  I hope you have a great rest of your week!  Carlin POUR. Cindie, D.O. Gastroenterology and Hepatology [facility]

## 2024-05-17 ENCOUNTER — Other Ambulatory Visit: Payer: Self-pay

## 2024-05-17 ENCOUNTER — Emergency Department (HOSPITAL_COMMUNITY)
Admission: EM | Admit: 2024-05-17 | Discharge: 2024-05-17 | Disposition: A | Discharge status: Other Acute Inpatient Hospital

## 2024-05-17 ENCOUNTER — Emergency Department (HOSPITAL_COMMUNITY)

## 2024-05-17 ENCOUNTER — Encounter (HOSPITAL_COMMUNITY): Payer: Self-pay

## 2024-05-17 DIAGNOSIS — K429 Umbilical hernia without obstruction or gangrene: Secondary | ICD-10-CM | POA: Diagnosis not present

## 2024-05-17 DIAGNOSIS — K460 Unspecified abdominal hernia with obstruction, without gangrene: Secondary | ICD-10-CM

## 2024-05-17 DIAGNOSIS — R1033 Periumbilical pain: Secondary | ICD-10-CM | POA: Diagnosis present

## 2024-05-17 DIAGNOSIS — K420 Umbilical hernia with obstruction, without gangrene: Secondary | ICD-10-CM | POA: Diagnosis not present

## 2024-05-17 DIAGNOSIS — Z79899 Other long term (current) drug therapy: Secondary | ICD-10-CM | POA: Diagnosis not present

## 2024-05-17 DIAGNOSIS — R1084 Generalized abdominal pain: Secondary | ICD-10-CM | POA: Diagnosis not present

## 2024-05-17 DIAGNOSIS — K7031 Alcoholic cirrhosis of liver with ascites: Secondary | ICD-10-CM

## 2024-05-17 DIAGNOSIS — Z743 Need for continuous supervision: Secondary | ICD-10-CM | POA: Diagnosis not present

## 2024-05-17 DIAGNOSIS — I10 Essential (primary) hypertension: Secondary | ICD-10-CM | POA: Diagnosis not present

## 2024-05-17 LAB — CBC WITH DIFFERENTIAL/PLATELET
Abs Immature Granulocytes: 0.02 K/uL (ref 0.00–0.07)
Basophils Absolute: 0 K/uL (ref 0.0–0.1)
Basophils Relative: 1 %
Eosinophils Absolute: 0.2 K/uL (ref 0.0–0.5)
Eosinophils Relative: 4 %
HCT: 38.3 % — ABNORMAL LOW (ref 39.0–52.0)
Hemoglobin: 12.6 g/dL — ABNORMAL LOW (ref 13.0–17.0)
Immature Granulocytes: 0 %
Lymphocytes Relative: 14 %
Lymphs Abs: 0.7 K/uL (ref 0.7–4.0)
MCH: 30.9 pg (ref 26.0–34.0)
MCHC: 32.9 g/dL (ref 30.0–36.0)
MCV: 93.9 fL (ref 80.0–100.0)
Monocytes Absolute: 0.7 K/uL (ref 0.1–1.0)
Monocytes Relative: 14 %
Neutro Abs: 3.3 K/uL (ref 1.7–7.7)
Neutrophils Relative %: 67 %
Platelets: 123 K/uL — ABNORMAL LOW (ref 150–400)
RBC: 4.08 MIL/uL — ABNORMAL LOW (ref 4.22–5.81)
RDW: 18.6 % — ABNORMAL HIGH (ref 11.5–15.5)
WBC: 4.9 K/uL (ref 4.0–10.5)
nRBC: 0 % (ref 0.0–0.2)

## 2024-05-17 LAB — COMPREHENSIVE METABOLIC PANEL WITH GFR
ALT: 36 U/L (ref 0–44)
AST: 84 U/L — ABNORMAL HIGH (ref 15–41)
Albumin: 2.9 g/dL — ABNORMAL LOW (ref 3.5–5.0)
Alkaline Phosphatase: 252 U/L — ABNORMAL HIGH (ref 38–126)
Anion gap: 12 (ref 5–15)
BUN: 19 mg/dL (ref 6–20)
CO2: 23 mmol/L (ref 22–32)
Calcium: 9.2 mg/dL (ref 8.9–10.3)
Chloride: 104 mmol/L (ref 98–111)
Creatinine, Ser: 1.03 mg/dL (ref 0.61–1.24)
GFR, Estimated: >60 mL/min
Glucose, Bld: 103 mg/dL — ABNORMAL HIGH (ref 70–99)
Potassium: 3.8 mmol/L (ref 3.5–5.1)
Sodium: 138 mmol/L (ref 135–145)
Total Bilirubin: 4.6 mg/dL — ABNORMAL HIGH (ref 0.0–1.2)
Total Protein: 7.1 g/dL (ref 6.5–8.1)

## 2024-05-17 LAB — LACTIC ACID, PLASMA: Lactic Acid, Venous: 1.2 mmol/L (ref 0.5–1.9)

## 2024-05-17 LAB — PROTIME-INR
INR: 1.2 (ref 0.8–1.2)
Prothrombin Time: 15.5 s — ABNORMAL HIGH (ref 11.4–15.2)

## 2024-05-17 MED ORDER — FENTANYL CITRATE (PF) 100 MCG/2ML IJ SOLN: 50.0000 ug | Freq: Once | INTRAMUSCULAR | Status: AC

## 2024-05-17 MED ORDER — FENTANYL CITRATE (PF) 100 MCG/2ML IJ SOLN
INTRAMUSCULAR | Status: AC
  Administered 2024-05-17: 50 ug via INTRAVENOUS
  Filled 2024-05-17: qty 2

## 2024-05-17 MED ORDER — FENTANYL CITRATE (PF) 100 MCG/2ML IJ SOLN
50.0000 ug | Freq: Once | INTRAMUSCULAR | Status: AC
  Administered 2024-05-17: 50 ug via INTRAVENOUS
  Filled 2024-05-17: qty 2

## 2024-05-17 MED ORDER — LACTATED RINGERS IV BOLUS
500.0000 mL | Freq: Once | INTRAVENOUS | Status: AC
  Administered 2024-05-17: 500 mL via INTRAVENOUS

## 2024-05-17 MED ORDER — MORPHINE SULFATE (PF) 4 MG/ML IV SOLN
4.0000 mg | Freq: Once | INTRAVENOUS | Status: AC
  Administered 2024-05-17: 4 mg via INTRAVENOUS
  Filled 2024-05-17: qty 1

## 2024-05-17 MED ORDER — ONDANSETRON HCL 4 MG/2ML IJ SOLN
4.0000 mg | Freq: Once | INTRAMUSCULAR | Status: AC
  Administered 2024-05-17: 4 mg via INTRAVENOUS
  Filled 2024-05-17: qty 2

## 2024-05-17 MED ORDER — IOHEXOL 300 MG/ML  SOLN
100.0000 mL | Freq: Once | INTRAMUSCULAR | Status: AC | PRN
  Administered 2024-05-17: 100 mL via INTRAVENOUS

## 2024-05-17 NOTE — Progress Notes (Addendum)
 [facility]  While I have been in the OR, the ED has contacted [facility] who is looking at the chart to see if they can accept the patient. [facility] asked if [facility] could take patient pending them not accepting. ED reached out to [facility] to notify them pending [facility]'s answer.   Lactic 1.2.  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 His portal vein confluence, SMV and splenic vein are patent. Ordered doppler to see if could better delineate but this was not helpful.  Best practice guidelines reviewed and it appears to he may not need anticoagulation for this specific thrombosis. Awaiting [facility] recommendations. Appreciate everyone's assistance.   [facility] Clinical Practice Update on Management of Portal Vein Thrombosis in Patients With Cirrhosis: Expert Review 06/2023 Best Practice Advice 5 Consider observation, with repeat imaging every 3 months until clot regression, in patients with cirrhosis without intestinal ischemia and recent (<6 months) thrombosis involving the intrahepatic portal vein branches or when there is <50% occlusion of the main portal vein, splenic vein, or mesenteric veins.  Manuelita Pander, MD [facility] [address] [place] [address], [place] [address] [phone] (office)

## 2024-05-17 NOTE — ED Notes (Signed)
 Called [facility] line waiting for call back

## 2024-05-17 NOTE — ED Provider Notes (Signed)
 " [place] EMERGENCY DEPARTMENT AT [facility] Provider Note   CSN: [id number] Arrival date & time: 05/17/24  0840     Patient presents with: Hernia   Antonio [name] is a 53 y.o. male.  History of anemia, alcohol abuse with alcoholic liver cirrhosis, heart failure, GI bleeding.  Presents ER today for painful irreducible hernia just above his umbilicus.  He states he had his hernia for a long time but started hurting 2 days ago, he is not able to push it back in.  He vomited several times yesterday.  Last p.o. intake was around 8 PM last night.   HPI     Prior to Admission medications  Medication Sig Start Date End Date Taking? Authorizing Provider  lactulose  (CHRONULAC ) 10 GM/15ML solution Take 15 mLs (10 g total) by mouth as needed for moderate constipation. 10/30/23   Ricky Fines, MD  pantoprazole  (PROTONIX ) 40 MG tablet Take 1 tablet (40 mg total) by mouth 2 (two) times daily. 04/03/24 04/03/25  Cindie Carlin POUR, DO  rifaximin  (XIFAXAN ) 550 MG TABS tablet Take 1 tablet (550 mg total) by mouth 2 (two) times daily. 02/18/24   Shirlean Therisa ORN, NP  spironolactone  (ALDACTONE ) 25 MG tablet Take 1 tablet (25 mg total) by mouth daily. 12/05/23   Shirlean Therisa ORN, NP  torsemide  (DEMADEX ) 20 MG tablet TAKE 1 TABLET BY MOUTH DAILY TAKE 1/2 TABLE WHEN PATIENT HAS EXTRA SWELLING 05/12/19   Shirlean Therisa ORN, NP    Allergies: Patient has no known allergies.    Review of Systems  Updated Vital Signs Pulse 67   Ht 6' 2 (1.88 m)   Wt 124.7 kg   SpO2 99%   BMI 35.30 kg/m   Physical Exam Vitals and nursing note reviewed.  Constitutional:      General: He is not in acute distress.    Appearance: He is well-developed.  HENT:     Head: Normocephalic and atraumatic.  Eyes:     Conjunctiva/sclera: Conjunctivae normal.  Cardiovascular:     Rate and Rhythm: Normal rate and regular rhythm.     Heart sounds: No murmur heard. Pulmonary:     Effort: Pulmonary effort is normal. No  respiratory distress.     Breath sounds: Normal breath sounds.  Abdominal:     Palpations: Abdomen is soft.     Tenderness: There is abdominal tenderness.     Hernia: A hernia is present.      Comments: There is an obvious umbilical hernia that is soft and not tender, there is a more superior portion of this that is very hard and tender and not reducible.  Musculoskeletal:        General: No swelling.     Cervical back: Neck supple.  Skin:    General: Skin is warm and dry.     Capillary Refill: Capillary refill takes less than 2 seconds.  Neurological:     Mental Status: He is alert.  Psychiatric:        Mood and Affect: Mood normal.     (all labs ordered are listed, but only abnormal results are displayed) Labs Reviewed - No data to display  EKG: None  Radiology: No results found.   Procedures   Medications Ordered in the ED - No data to display  Medical Decision Making This patient presents to the ED for concern of abdominal pain due to hernia, this involves an extensive number of treatment options, and is a complaint that carries with it a high risk of complications and morbidity.  The differential diagnosis includes incarcerated hernia, bowel obstruction, strangulated hernia, other   Co morbidities that complicate the patient evaluation :   Cirrhosis   Additional history obtained:  Additional history obtained from EMR External records from outside source obtained and reviewed including prior notes, labs and imaging   Lab Tests:  I Ordered, and personally interpreted labs.  The pertinent results include: Lactic acid normal, INR 1.2, AST 84, alk phos 52, bilirubin 4.6, creatinine is normal, CBC with no leukocytosis, hemoglobin 12.6 which is stable   Imaging Studies ordered:  I ordered imaging studies including CT abdomen pelvis which shows Small bowel obstruction with transition point at midline umbilical hernia containing  dilated loops of small bowel and fluid, advanced changes of cirrhosis, thrombosis within for portal vein I independently visualized and interpreted imaging within scope of identifying emergent findings  I agree with the radiologist interpretation    Consultations Obtained:  I requested consultation with the general surgeon Dr. Kallie,  and discussed lab and imaging findings as well as pertinent plan - they recommend: She had a patient at bedside as I was not able to reduce the patient's hernia, she removed NG tube which was placed, she came attempted reduction of the hernia unfortunately unsuccessfully, seem to be some improvement but on repeat CT the obstruction was redemonstrated.  Given patient's advanced cirrhosis he is high risk for surgery, he has been evaluated at [facility] for his cirrhosis before for possible transplant, recommended transfer to [facility] for higher level of care.  Ultrasound of liver Doppler did not show portal vein thrombosis, recommended guidelines do not recommend anticoagulation for this patient  I spoke with the transfer center, Dr. Pearla Cahill the general surgeon will accept ED to ED.    Problem List / ED Course / Critical interventions / Medication management  Incarcerated hernia with bowel obstruction-I was not able to reduce this, consulted surgery and Dr. Kallie attempted reduction as well and unfortunately this was not successful.  Patient is high risk for morbidity and mortality due to his cirrhosis so recommended transfer to [facility] which was arranged as above.  Patient tolerated NG tube well, chest x-ray shows that this is in place.  He and his wife are agreeable with transfer. I ordered medication including fentanyl  for abdominal pain  Reevaluation of the patient after these medicines showed that the patient improved I have reviewed the patients home medicines and have made adjustments as needed       Amount and/or Complexity of Data Reviewed Labs:  ordered. Radiology: ordered.  Risk Prescription drug management.        Final diagnoses:  None    ED Discharge Orders     None          Suellen Sherran DELENA DEVONNA 05/17/24 1953  "

## 2024-05-17 NOTE — ED Notes (Signed)
 Antonio [name] has been paged , [name] ,And Paged Through [facility] general surgery

## 2024-05-17 NOTE — Consult Note (Addendum)
 [facility] Consult  Reason for Consult: Incarcerated umbilical hernia in cirrhotic patient  Referring Physician: Sherran Barks PA ED   Chief Complaint   Hernia     HPI: Antonio [name] is a 53 y.o. male with an incarcerated umbilical hernia in the setting of cirrhosis. 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 He had a surveillance EGD Friday and did fine after that but had some worsening abdominal pain on Saturday and vomited. He came into the hospital and CT demonstrated a hernia with small bowel and obstruction, no signs of ischemia or pneumatosis. The CT also had concern for intrahepatic and extrahepatic portal vein thrombosis which would be new from his prior CT scans.  I have came into the hospital to try to help get this reduced and come up with a plan. The ED tried to reduce without success, getting the lower part of the hernia reduced but the upper newer bulge would not reduce.  An NG was placed.  Past Medical History:  Diagnosis Date   Alcohol abuse    Alcoholic hepatitis (HCC)    Cirrhosis with alcoholism (HCC)    completed Hep A and B vaccinations in 2017   Hepatocellular carcinoma (HCC)    History of gout     Past Surgical History:  Procedure Laterality Date   BIOPSY  03/08/2016   Procedure: BIOPSY;  Surgeon: Lamar CHRISTELLA Hollingshead, MD;  [facility];  Service: Endoscopy;;  descending colon biopsies   BIOPSY  04/14/2019   Procedure: BIOPSY;  Surgeon: Harvey Margo LITTIE, MD;  [facility];  Service: Endoscopy;;   BIOPSY  08/21/2021   Procedure: BIOPSY;  Surgeon: Cindie Carlin POUR, DO;  [facility];  Service: Endoscopy;;   Colonoscopy     per patient around 2014 in [place]    COLONOSCOPY WITH PROPOFOL  [name] 03/08/2016   One 8 mm  polyp in the descending colon, removed with a hot snare. Resected and Retrieved.One 4 mm polyp in the cecum, removed with a cold snare. Resected and retrieved. Rectal varices. Melanosis coli. Abnormal left colon mucosa status post biopsy. Tubular adenomas. Surveillance was due 2022.   COLONOSCOPY WITH PROPOFOL  [name] 08/21/2021   Procedure: COLONOSCOPY WITH PROPOFOL ;  Surgeon: Cindie Carlin POUR, DO;  [facility];  Service: Endoscopy;  Laterality: [name];  11:15am   ESOPHAGOGASTRODUODENOSCOPY [name] 04/03/2024   Procedure: EGD (ESOPHAGOGASTRODUODENOSCOPY);  Surgeon: Cindie Carlin POUR, DO;  [facility];  Service: Endoscopy;  Laterality: [name];  7:30 am, asa 3   ESOPHAGOGASTRODUODENOSCOPY (EGD) WITH PROPOFOL  [name] 12/28/2015   Dr. harvey: 4 columns of esophageal varices, 3 grade 1, 1 grade 2 with no evidence of bleeding. Mild portal gastropathy with no evidence of active bleeding.   ESOPHAGOGASTRODUODENOSCOPY (EGD) WITH PROPOFOL  [name] 03/08/2016   Procedure: ESOPHAGOGASTRODUODENOSCOPY (EGD) WITH PROPOFOL ;  Surgeon: Lamar CHRISTELLA Hollingshead, MD;  [facility];  Service: Endoscopy;  Laterality: [name];   ESOPHAGOGASTRODUODENOSCOPY (EGD) WITH PROPOFOL  [name] 04/14/2019   Grade 1 and 2 esophageal varices without bleeding and no stigmata of bleeding. moderate portal gastropath, mild erosive gastritis. Path with positive H.pylori   ESOPHAGOGASTRODUODENOSCOPY (EGD) WITH PROPOFOL  [name] 08/21/2021   Procedure: ESOPHAGOGASTRODUODENOSCOPY (EGD) WITH PROPOFOL ;  Surgeon: Cindie Carlin POUR, DO;  [facility];  Service: Endoscopy;  Laterality: [name];   GIVENS CAPSULE STUDY [name] 07/05/2017   Procedure: GIVENS CAPSULE STUDY;  Surgeon: Harvey Margo CROME, MD;  [facility];  Service: Endoscopy;  Laterality: [name];  7:30am   GIVENS CAPSULE STUDY [name] 08/05/2017   Procedure: GIVENS CAPSULE STUDY;  Surgeon: Harvey Margo CROME, MD;  [facility];  Service: Endoscopy;  Laterality: [name];  7:30am   ORIF  ANKLE FRACTURE Right 06/02/2021   Procedure: OPEN REDUCTION INTERNAL FIXATION (ORIF) ANKLE FRACTURE;  Surgeon: Margrette Taft BRAVO, MD;  Location: [facility];  Service: Orthopedics;  Laterality: Right;   POLYPECTOMY  03/08/2016   Procedure: POLYPECTOMY;  Surgeon: Lamar CHRISTELLA Hollingshead, MD;  [facility];  Service: Endoscopy;;  cecum and descending    POLYPECTOMY  08/21/2021   Procedure: POLYPECTOMY INTESTINAL;  Surgeon: Cindie Carlin POUR, DO;  [facility];  Service: Endoscopy;;    Family History  Problem Relation Age of Onset   Colon cancer Neg Hx    Liver disease Neg Hx     Social History[1]  Medications: I have reviewed the patient's current medications. No current facility-administered medications for this encounter.   Current Outpatient Medications  Medication Sig Dispense Refill Last Dose/Taking   lactulose  (CHRONULAC ) 10 GM/15ML solution Take 15 mLs (10 g total) by mouth as needed for moderate constipation.      pantoprazole  (PROTONIX ) 40 MG tablet Take 1 tablet (40 mg total) by mouth 2 (two) times daily. 60 tablet 11    rifaximin  (XIFAXAN ) 550 MG TABS tablet Take 1 tablet (550 mg total) by mouth 2 (two) times daily. 60 tablet 5    spironolactone  (ALDACTONE ) 25 MG tablet Take 1 tablet (25 mg total) by mouth daily. 90 tablet 1    torsemide  (DEMADEX ) 20 MG tablet TAKE 1 TABLET BY MOUTH DAILY TAKE 1/2 TABLE WHEN PATIENT HAS EXTRA SWELLING 135 tablet 1      Allergies[2]   ROS:  A comprehensive review of systems was negative except for: Gastrointestinal: positive for abdominal pain, nausea, and vomiting  Blood pressure 128/79, pulse 70, resp. rate 17, height 6' 2 (1.88 m), weight 124.7 kg, SpO2 100%. Physical Exam Vitals reviewed.  Constitutional:      Appearance: He is obese.  HENT:     Head: Normocephalic.     Mouth/Throat:     Mouth: Mucous membranes are moist.  Eyes:     Extraocular Movements: Extraocular movements intact.  Pulmonary:     Effort:  Pulmonary effort is normal.  Abdominal:     General: There is no distension.     Palpations: Abdomen is soft.     Tenderness: There is abdominal tenderness. There is no guarding or rebound.     Hernia: A hernia is present.     Comments: Attempt at reduction of the hernia after NG placed, able to reduce the lower part of the hernia in the umbilical skin area, more superior bulge where the fluid filled sac from prior scans have been is partially reduced but cannot get it to fully reduce, unable to tell if this has reduced bowel or not, spent 10 minutes on two separate attempts to get the hernia reduced, gave patient fentanyl  and he tolerated the attempts well, abd otherwise soft and nontender in other areas not at the hernia   Musculoskeletal:        General: Normal range of motion.     Cervical back: Normal range of motion.  Skin:  General: Skin is warm.  Neurological:     General: No focal deficit present.     Mental Status: He is alert and oriented to person, place, and time.  Psychiatric:        Mood and Affect: Mood normal.     Results: Results for orders placed or performed during the hospital encounter of 05/17/24 (from the past 48 hours)  Comprehensive metabolic panel     Status: Abnormal   Collection Time: 05/17/24  9:34 AM  Result Value Ref Range   Sodium 138 135 - 145 mmol/L   Potassium 3.8 3.5 - 5.1 mmol/L   Chloride 104 98 - 111 mmol/L   CO2 23 22 - 32 mmol/L   Glucose, Bld 103 (H) 70 - 99 mg/dL    Comment: Glucose reference range applies only to samples taken after fasting for at least 8 hours.   BUN 19 6 - 20 mg/dL   Creatinine, Ser 8.96 0.61 - 1.24 mg/dL    Comment: ICTERUS AT THIS LEVEL MAY AFFECT RESULT   Calcium 9.2 8.9 - 10.3 mg/dL   Total Protein 7.1 6.5 - 8.1 g/dL   Albumin  2.9 (L) 3.5 - 5.0 g/dL   AST 84 (H) 15 - 41 U/L   ALT 36 0 - 44 U/L   Alkaline Phosphatase 252 (H) 38 - 126 U/L   Total Bilirubin 4.6 (H) 0.0 - 1.2 mg/dL   GFR, Estimated >39 >39  mL/min    Comment: (NOTE) Calculated using the CKD-EPI Creatinine Equation (2021)    Anion gap 12 5 - 15    Comment: Performed at [facility], [address] [place], [place] [address]  CBC with Differential     Status: Abnormal   Collection Time: 05/17/24  9:34 AM  Result Value Ref Range   WBC 4.9 4.0 - 10.5 K/uL   RBC 4.08 (L) 4.22 - 5.81 MIL/uL   Hemoglobin 12.6 (L) 13.0 - 17.0 g/dL   HCT 61.6 (L) 60.9 - 47.9 %   MCV 93.9 80.0 - 100.0 fL   MCH 30.9 26.0 - 34.0 pg   MCHC 32.9 30.0 - 36.0 g/dL   RDW 81.3 (H) 88.4 - 84.4 %   Platelets 123 (L) 150 - 400 K/uL   nRBC 0.0 0.0 - 0.2 %   Neutrophils Relative % 67 %   Neutro Abs 3.3 1.7 - 7.7 K/uL   Lymphocytes Relative 14 %   Lymphs Abs 0.7 0.7 - 4.0 K/uL   Monocytes Relative 14 %   Monocytes Absolute 0.7 0.1 - 1.0 K/uL   Eosinophils Relative 4 %   Eosinophils Absolute 0.2 0.0 - 0.5 K/uL   Basophils Relative 1 %   Basophils Absolute 0.0 0.0 - 0.1 K/uL   Immature Granulocytes 0 %   Abs Immature Granulocytes 0.02 0.00 - 0.07 K/uL    Comment: Performed at [facility], [address], [place], [place] [address]  Protime-INR     Status: Abnormal   Collection Time: 05/17/24  9:34 AM  Result Value Ref Range   Prothrombin Time 15.5 (H) 11.4 - 15.2 seconds   INR 1.2 0.8 - 1.2    Comment: (NOTE) INR goal varies based on device and disease states. Performed at [facility], [address] [place], [place] [address]    Personally reviewed CT scan and hold scans- note the small bowel I the hernia lower that is persistent and a new loop more superior to the more chronic loop that is out, a  fluid filled sac is noted on prior CT scans and this is where the new loop of bowel has come out.  DG Chest Portable 1 View Result Date: 05/17/2024 EXAM: 1 VIEW(S) XRAY OF THE CHEST 05/17/2024 12:04:00 PM COMPARISON: 12/04/2023 CLINICAL HISTORY: NG placement FINDINGS: LINES, TUBES AND DEVICES: Enteric tube with tip coiling along the fundus of the stomach.  Overlapping cardiac leads. LUNGS AND PLEURA: Interval resolution of moderate right pleural effusion. No focal pulmonary opacity. No pneumothorax. HEART AND MEDIASTINUM: Overlapping cardiac leads. No acute abnormality of the cardiac and mediastinal silhouettes. BONES AND SOFT TISSUES: No acute osseous abnormality. UPPER ABDOMEN: Dilated loops of small bowel in upper abdomen. IMPRESSION: 1. Enteric tube with tip coiling along the fundus of the stomach. 2. Dilated loops of small bowel in the upper abdomen. 3. Interval resolution of moderate right pleural effusion. Electronically signed by: Evalene Coho MD 05/17/2024 12:13 PM EST RP Workstation: [id number]   CT ABDOMEN PELVIS W CONTRAST Result Date: 05/17/2024 EXAM: CT ABDOMEN AND PELVIS WITH CONTRAST 05/17/2024 10:26:43 AM TECHNIQUE: CT of the abdomen and pelvis was performed with the administration of 100 mL of iohexol  (OMNIPAQUE ) 300 MG/ML solution. Multiplanar reformatted images are provided for review. Automated exposure control, iterative reconstruction, and/or weight-based adjustment of the mA/kV was utilized to reduce the radiation dose to as low as reasonably achievable. COMPARISON: 10/27/2023. CLINICAL HISTORY: Bowel obstruction suspected; hernia. FINDINGS: LOWER CHEST: Trace right pleural effusion. LIVER: Morphologic features of the liver compatible with cirrhosis. Low attenuation lesion within the right lobe of the liver measures 3 cm, image 24/2, unchanged from previous exam. Previously characterized as suspicious for HCC. A small low-density structure along the dome of the liver is unchanged, measuring 4 mm, image 8/2. A filling defect is suspected within the eccentric, nonocclusive thrombus within the portal vein is suspected, image 27/2. Portal venous confluence, SMV, and splenic veins appear patent. GALLBLADDER AND BILE DUCTS: A stone within the gallbladder neck measures 9 mm. There is wall thickening of the gallbladder, which is nonspecific in  the setting of cirrhosis and portal venous hypertension. No biliary ductal dilatation. SPLEEN: The spleen measures 13.8 cm in the craniocaudal dimension. PANCREAS: No acute abnormality. ADRENAL GLANDS: No acute abnormality. KIDNEYS, URETERS AND BLADDER: No stones in the kidneys or ureters. No hydronephrosis. No perinephric or periureteral stranding. Urinary bladder is unremarkable. GI AND BOWEL: Low attenuation edema involving the wall of the gastric fundus. Large perigastric varices identified. There is abnormal dilatation of the mid small bowel loops with multiple air-fluid levels measuring up to 4.3 cm in diameter. Transitioned to decreased caliber. The transition point is at the midline umbilical hernia which measures 9.2 x 6.2 x 10.9 cm which contains a dilated loop of small bowel and fluid. Distal small bowel loops have a normal caliber. There is mild reflect incomplete distention versus hepatic encephalopathy. No signs of pneumatosis. The appendix is visualized and appears normal. PERITONEUM AND RETROPERITONEUM: Small volume perihepatic, perisplenic, and pelvic ascites. No discrete fluid collections identified. No free air. No signs of pneumoperitoneum. VASCULATURE: Aorta is normal in caliber. LYMPH NODES: No lymphadenopathy. REPRODUCTIVE ORGANS: The prostate gland is unremarkable. BONES AND SOFT TISSUES: Lumbar spondylosis with first-degree anterolisthesis of L4 on L5. No acute or suspicious osseous findings. No focal soft tissue abnormality. IMPRESSION: 1. Small bowel obstruction with transition point at a midline umbilical hernia containing dilated loops of small bowel and fluid. 2. No pneumatosis or pneumoperitoneum. 3. Advanced changes of cirrhosis with stigmata of portal venous hypertension including  varices, ascites, and splenomegaly. 4. Thrombosis within the portal vein thrombosis is identified with clot noted in the intrahepatic and extrahepatic portal vein. The portal venous confluence, smv, and  splenic veins appear patent. 5. Diffuse low attenuation edema involving the wall of the gastric fundus. Correlate for any clinical signs or symptoms of gastritis. 6. Unchanged indeterminate subcapsular lesion within the right hepatic lobe, previously characterized as suspicious for hcc. 7. Aortic atherosclerotic calcification. Electronically signed by: Waddell Calk MD 05/17/2024 10:47 AM EST RP Workstation: [id number]     Assessment & Plan:  MADDON HORTON is a 53 y.o. male with a worsening umbilical hernia with incarceration in the setting of cirrhosis. Today's labs he is Child Pugh C and he has a MELD 15 today. He has been seen at [facility] care and they recommended he see Liver Transplant. He has seen Transplant at [facility] in the past around 2023 but was lost to follow up per the documentation.  -Doppler of the Liver order to see if there is a thrombosis or not, this would require anticoagulation and would complicate his picture, the CT has an area on 1 of the axial images  -Attempted reduction, I think we have it at least partially reduced, I am hopeful I am feeling the residual of the fluid pocket he had on prior CT scans, will repeat CT to see if the area is reduced -If the area is not reduced he will need transfer to [facility] - Pending the Doppler of the liver, he may need transfer to [facility] regardless    I have discussed the danger of the hernia and any surgery with him and his wife, and that he is not a good candidate for surgery here. I have discussed this case with Dr. Cindie his GI who also agrees this patient is not a candidate for surgery at [facility] and recommends Transfer to [facility].    Manuelita JAYSON Pander 05/17/2024, 1:10 PM          [1]  Social History Tobacco Use   Smoking status: Former    Current packs/day: 0.00    Average packs/day: 0.3 packs/day for 8.0 years (2.0 ttl pk-yrs)    Types: Cigarettes    Start date: 2008    Quit date: 2016    Years since quitting: 9.9     Passive exposure: Current   Smokeless tobacco: Never  Vaping Use   Vaping status: Never Used  Substance Use Topics   Alcohol use: No    Comment: former--quit after 12/2015 hospitalization   Drug use: No  [2] No Known Allergies

## 2024-05-17 NOTE — Progress Notes (Signed)
 [facility]  Hernia on the inferior part came back out in the bilobed hernia and CT reporting same SBO from the more superior portion of hernia with SB loop, some mesenteric edema.   Clinically his exam is reassuring and his vitals are reassuring and labs reassuring. Lactic being sent now.,  With his cirrhosis and his issues, it will be safer for him to get surgery at tertiary care and he has followed with [facility] in the past.   ED calling [facility]. Doppler liver still pending.  Manuelita Pander, MD [facility] [address] [place], [place] [address] [phone] (office)'

## 2024-05-17 NOTE — Progress Notes (Signed)
 [facility]  Still awaiting [facility]'s decision.   Repeat exam with soft abdomen and nontender except around the hernia. I am able to reduce the part of the hernia at the umbilical skin but the knot/ knob above the umbilicus that corresponds to a loop of small bowel on the CT and fluid pocket cannot be reduced still.  Manuelita Pander, MD [facility] [address] [place], [place] [address] [phone] (office)

## 2024-05-17 NOTE — ED Triage Notes (Signed)
 Pt came in for hernia pain. Pt has a hernia above his navel. States he has had it for a while but it hasn't bothered him in a while. Pt states he vomited a few times yesterday.

## 2024-05-17 NOTE — ED Notes (Signed)
 Called [facility] trans again waiting for call back

## 2024-05-18 ENCOUNTER — Encounter (HOSPITAL_COMMUNITY): Payer: Self-pay | Admitting: Internal Medicine

## 2024-06-24 NOTE — Progress Notes (Unsigned)
 "  GI Office Note    Referring Provider: Maree Isles, MD Primary Care Physician:  Maree Isles, MD Primary Gastroenterologist: Carlin POUR. Cindie, DO  Date:  06/25/2024  ID:  Lora, Glomski 03-22-1971, MRN [id number]   Chief Complaint   Chief Complaint  Patient presents with   Follow-up    Hospital follow up.    History of Present Illness  Mihcael L Tejada is a 54 y.o. male with a history of EtOH cirrhosis, IDA, esophageal varices, H. pylori gastritis in 2020 with documented eradication, tubular adenomas, chronic GERD, remote history of encephalopathy during acute phase and recurrent right pleural effusions with previous need for thoracentesis, and HCC s/p ablation in September 2023 presenting today for hospital follow-up after incarcerated ventral hernia repair with subsequent hepatic encephalopathy secondary to ileus.  Following with [facility] for consideration of liver transplant previously.  Imaging: CT cirrhosis Aug 2025 at [facility]: interval ablation of segment 5 lesion, new segment 6 lesion with washout, LR-30 November 2023 US  liver doppler: patent portal vein   Procedures: Colonoscopy March 2023: hemorrhoids, one 2 mm polyp s/p removal ( benign aggregates).    EGD March 2023: Grade 1 esophageal varices, gastritis s/p biopsy, portal gastropathy, duodenitis. Repeat EGD in 2 years. Negative H.pylori.    Labs:  Hepatitis A antibody negative in 2017, Hep B surface antigen negative in 2017. Hep B surfacve antibody negative. Hep C negative. Completed Hep A/B vaccinations 2017.   Labs in July reviewed: INR 1.3, Hgb 11.4, platelets 156, AST 78, ALT 24, Tbili 2.9, Alk Phos 234, Albumin  2.1. creatinine 1.1. Sodium 138. AFP 5.1. MELD 3.0 17.   Last office visit September 2025 with Therisa Stager, NP.  Per review of this note reports that he had a CT scan previously in August 2025 which noted a new lesion in segment 6.  He was noted to be taking torsemide  20 mg and Aldactone  25 mg and lactulose  as  needed.  Having a bowel movement about twice per day and will take the lactulose  if he does not have that bowel movement twice a day.  Xifaxan  attempted in the past but not covered and was currently taking.  He noted to be back at work as he was not able to get disability.  Denied any abdominal pain or encephalopathy, upper GI bleeding, nausea, vomiting, mental status changes, confusion, or shortness of breath.  Also no lower extremity edema or concern for ascites.  His etiology of cirrhosis was secondary to alcohol use and also with HCC.  He has had prior ablation of segment 5 lesion but he had recently had a new lesion in segment 6 LR-5.  And his plan was to reach out to [facility] for further discussion.  His prior ablation was in 2023.  He had been unsure if he wanted to pursue liver transplantation.  Last EGD in 2023 with grade 1 varices with prior decompensation in 2025 with need for thoracentesis.  Plan was to start Xifaxan , continue diuretics, proceed with EGD and follow-up in 6 months.  Discussed imaging with [facility].    Apr 20, 2024: After review of [facility] notes, IR has been attempting to get in touch with patient due to new liver lesion. I called [facility] at [phone]. Unable to reach. Also contacted a separate number listed at [phone] but unable to complete. Attempted to call patient to reach out as well, but I was unable to reach.  She sent a MyChart message.  He ultimately  was approved for Xifaxan .   EGD November 2025: - Grade II and large ( > 5 mm) esophageal varices with no bleeding and no stigmata of recent bleeding. Completely eradicated. Banded.  - Portal hypertensive gastropathy.  - Normal duodenal bulb, first portion of the duodenum and second portion of the duodenum.  - No specimens collected. - Recommended repeat EGD with banding in about 4 weeks.  EGD December 2025: - Grade I esophageal varices with no bleeding and no stigmata of recent bleeding.  - Portal hypertensive  gastropathy.  - Normal duodenal bulb, first portion of the duodenum and second portion of the duodenum.  - No specimens collected. - Repeat EGD for surveillance in 6 months - Advise considering starting carvedilol  given previously did not tolerate nadolol .  Admitted to [facility] 05/17/2024.  He presented with abdominal pain and was found to have an incarcerated umbilical hernia and underwent ventral hernia repair without bowel resection and his course was complicated by acute hepatic encephalopathy in the setting of ileus and aspiration.  He ultimately was advised to have a CT cirrhosis protocol in 4-8 weeks to evaluate portal vein thrombosis.  As this was found on a CT of the abdomen pelvis performed on 12/23.  He had been started on Eliquis  for this thrombosis.  Had follow-up with [facility] on 06/17/2024 for suture removal.  He has an appointment coming up 07/16/2024 with [facility] for the portal vein thrombosis as well as a follow-up on 07/29/2024 with the liver clinic  Today:  MELD 3.0: 16 at 05/17/2024  9:34 AM MELD-Na: 14 at 05/17/2024  9:34 AM Calculated from: Serum Creatinine: 1.03 mg/dL at 87/78/7974  0:65 AM Serum Sodium: 138 mmol/L (Using max of 137 mmol/L) at 05/17/2024  9:34 AM Total Bilirubin: 4.6 mg/dL at 87/78/7974  0:65 AM Serum Albumin : 2.9 g/dL at 87/78/7974  0:65 AM INR(ratio): 1.2 at 05/17/2024  9:34 AM Age at listing (hypothetical): 53 years Sex: Male at 05/17/2024  9:34 AM   Discussed the use of AI scribe software for clinical note transcription with the patient, who gave verbal consent to proceed.  History is notable for alcohol cirrhosis with portal hypertension, hepatic encephalopathy, esophageal varices, portal vein thrombosis, and prior hepatocellular carcinoma treated with ablation. He recently underwent surgical repair of his bowel without resection. Current medications include Eliquis  for portal vein thrombosis, rifaximin  twice daily for hepatic encephalopathy,  pantoprazole  for gastroesophageal reflux, and carvedilol  twice daily for portal hypertension. Diuretics were discontinued during his hospitalization. He continues to follow a low sodium diet.  Since discharge, there have been no episodes of bleeding, abdominal pain, or worsening confusion. Bowel movements are regular at two to three times daily, and lactulose  has not been required. No swelling in the legs, feet, or abdomen. He denies nausea, vomiting, dysphagia, dizziness, lightheadedness, chest pain, or jaundice. Blood pressure is not monitored at home.  He describes the onset of his recent episode as sudden pain like a big bump kick with inability to tolerate oral intake, vomiting, and inability to have a bowel movement. Initial presentation was to a local facility, where reduction was attempted, followed by transfer to [facility] for further management.  No new liver lesions were reported during his recent hospitalization. He has upcoming appointments with hepatology and the liver clinic, and is awaiting follow-up imaging to monitor for recurrence of new lesions.  Normal AFP in July 2025     Wt Readings from Last 6 Encounters:  06/25/24 283 lb 3.2 oz (128.5 kg)  05/17/24 274 lb 14.6 oz (124.7 kg)  04/03/24 275 lb (124.7 kg)  02/18/24 281 lb 1.6 oz (127.5 kg)  12/05/23 296 lb 1.6 oz (134.3 kg)  12/04/23 (!) 310 lb (140.6 kg)    Body mass index is 36.36 kg/m.   Current Outpatient Medications  Medication Sig Dispense Refill   apixaban  (ELIQUIS ) 2.5 MG TABS tablet Take 2.5 mg by mouth.     carvedilol  (COREG ) 3.125 MG tablet Take 3.125 mg by mouth.     lactulose  (CHRONULAC ) 10 GM/15ML solution Take 15 mLs (10 g total) by mouth as needed for moderate constipation.     pantoprazole  (PROTONIX ) 40 MG tablet Take 1 tablet (40 mg total) by mouth 2 (two) times daily. 60 tablet 11   rifaximin  (XIFAXAN ) 550 MG TABS tablet Take 1 tablet (550 mg total) by mouth 2 (two) times daily. 60 tablet 5    spironolactone  (ALDACTONE ) 25 MG tablet Take 1 tablet (25 mg total) by mouth daily. (Patient taking differently: Take 25 mg by mouth daily. PRN) 90 tablet 1   torsemide  (DEMADEX ) 20 MG tablet TAKE 1 TABLET BY MOUTH DAILY TAKE 1/2 TABLE WHEN PATIENT HAS EXTRA SWELLING (Patient taking differently: PRN) 135 tablet 1   zinc  sulfate, 50mg  elemental zinc , 220 (50 Zn) MG capsule Take 220 mg by mouth.     No current facility-administered medications for this visit.    Past Medical History:  Diagnosis Date   Alcohol abuse    Alcoholic hepatitis (HCC)    Cirrhosis with alcoholism (HCC)    completed Hep A and B vaccinations in 2017   Hepatocellular carcinoma (HCC)    History of gout     Past Surgical History:  Procedure Laterality Date   BIOPSY  03/08/2016   Procedure: BIOPSY;  Surgeon: Lamar CHRISTELLA Hollingshead, MD;  [facility];  Service: Endoscopy;;  descending colon biopsies   BIOPSY  04/14/2019   Procedure: BIOPSY;  Surgeon: Harvey Margo CROME, MD;  [facility];  Service: Endoscopy;;   BIOPSY  08/21/2021   Procedure: BIOPSY;  Surgeon: Cindie Carlin POUR, DO;  [facility];  Service: Endoscopy;;   Colonoscopy     per patient around 2014 in [place]    COLONOSCOPY WITH PROPOFOL  [name] 03/08/2016   One 8 mm polyp in the descending colon, removed with a hot snare. Resected and Retrieved.One 4 mm polyp in the cecum, removed with a cold snare. Resected and retrieved. Rectal varices. Melanosis coli. Abnormal left colon mucosa status post biopsy. Tubular adenomas. Surveillance was due 2022.   COLONOSCOPY WITH PROPOFOL  [name] 08/21/2021   Procedure: COLONOSCOPY WITH PROPOFOL ;  Surgeon: Cindie Carlin POUR, DO;  [facility];  Service: Endoscopy;  Laterality: [name];  11:15am   ESOPHAGEAL BANDING [name] 05/15/2024   Procedure: ESOPHAGOSCOPY, WITH ESOPHAGEAL VARICES BAND LIGATION;  Surgeon: Cindie Carlin POUR, DO;  [facility];  Service: Endoscopy;  Laterality: [name];    ESOPHAGOGASTRODUODENOSCOPY [name] 04/03/2024   Procedure: EGD (ESOPHAGOGASTRODUODENOSCOPY);  Surgeon: Cindie Carlin POUR, DO;  [facility];  Service: Endoscopy;  Laterality: [name];  7:30 am, asa 3   ESOPHAGOGASTRODUODENOSCOPY [name] 05/15/2024   Procedure: EGD (ESOPHAGOGASTRODUODENOSCOPY);  Surgeon: Cindie Carlin POUR, DO;  [facility];  Service: Endoscopy;  Laterality: [name];  815AM, ASA 3   ESOPHAGOGASTRODUODENOSCOPY (EGD) WITH PROPOFOL  [name] 12/28/2015   Dr. harvey: 4 columns of esophageal varices, 3 grade 1, 1 grade 2 with no evidence of bleeding. Mild portal gastropathy with no  evidence of active bleeding.   ESOPHAGOGASTRODUODENOSCOPY (EGD) WITH PROPOFOL  [name] 03/08/2016   Procedure: ESOPHAGOGASTRODUODENOSCOPY (EGD) WITH PROPOFOL ;  Surgeon: Lamar CHRISTELLA Hollingshead, MD;  [facility];  Service: Endoscopy;  Laterality: [name];   ESOPHAGOGASTRODUODENOSCOPY (EGD) WITH PROPOFOL  [name] 04/14/2019   Grade 1 and 2 esophageal varices without bleeding and no stigmata of bleeding. moderate portal gastropath, mild erosive gastritis. Path with positive H.pylori   ESOPHAGOGASTRODUODENOSCOPY (EGD) WITH PROPOFOL  [name] 08/21/2021   Procedure: ESOPHAGOGASTRODUODENOSCOPY (EGD) WITH PROPOFOL ;  Surgeon: Cindie Carlin POUR, DO;  [facility];  Service: Endoscopy;  Laterality: [name];   GIVENS CAPSULE STUDY [name] 07/05/2017   Procedure: GIVENS CAPSULE STUDY;  Surgeon: Harvey Margo CROME, MD;  [facility];  Service: Endoscopy;  Laterality: [name];  7:30am   GIVENS CAPSULE STUDY [name] 08/05/2017   Procedure: GIVENS CAPSULE STUDY;  Surgeon: Harvey Margo CROME, MD;  [facility];  Service: Endoscopy;  Laterality: [name];  7:30am   ORIF ANKLE FRACTURE Right 06/02/2021   Procedure: OPEN REDUCTION INTERNAL FIXATION (ORIF) ANKLE FRACTURE;  Surgeon: Margrette Taft BRAVO, MD;  [facility]: [facility];  Service: Orthopedics;  Laterality: Right;   POLYPECTOMY  03/08/2016   Procedure: POLYPECTOMY;  Surgeon: Lamar CHRISTELLA Hollingshead, MD;  [facility];  Service: Endoscopy;;  cecum and descending    POLYPECTOMY  08/21/2021   Procedure: POLYPECTOMY INTESTINAL;  Surgeon: Cindie Carlin POUR, DO;  [facility];  Service: Endoscopy;;   UMBILICAL HERNIA REPAIR      Family History  Problem Relation Age of Onset   Colon cancer Neg Hx    Liver disease Neg Hx     Allergies as of 06/25/2024   (No Known Allergies)    Social History   Socioeconomic History   Marital status: Married    Spouse name: Not on file   Number of children: Not on file   Years of education: Not on file   Highest education level: Not on file  Occupational History   Not on file  Tobacco Use   Smoking status: Former    Current packs/day: 0.00    Average packs/day: 0.3 packs/day for 8.0 years (2.0 ttl pk-yrs)    Types: Cigarettes    Start date: 2008    Quit date: 2016    Years since quitting: 10.0    Passive exposure: Current   Smokeless tobacco: Never  Vaping Use   Vaping status: Never Used  Substance and Sexual Activity   Alcohol use: No    Comment: former--quit after 12/2015 hospitalization   Drug use: No   Sexual activity: Yes  Other Topics Concern   Not on file  Social History Narrative   MARRIED. 2 KIDS-7 GRANDBABIES. SPENDS FREE TIME: WITH GRANDCHILDREN, AND WATCHING TV.   Social Drivers of Health   Tobacco Use: Medium Risk (06/25/2024)   Patient History    Smoking Tobacco Use: Former    Smokeless Tobacco Use: Never    Passive Exposure: Current  Physicist, Medical Strain: High Risk (06/02/2024)   Received from [facility]   Overall Financial Resource Strain (CARDIA)    Difficulty of Paying Living Expenses: Hard  Food Insecurity: No Food Insecurity (06/02/2024)   Received from [facility]   Epic    Within the past 12 months, you worried that your food would run out before you got the money to buy more.: Never true    Within the past 12 months, the food you bought  just didn't last and you didn't have money to get more.: Never true  Transportation Needs: No Transportation Needs (06/02/2024)   Received from [facility] - Transportation    In the past 12 months, has lack of transportation kept you from medical appointments or from getting medications?: No    Lack of Transportation (Non-Medical): No  Physical Activity: Not on file  Stress: Not on file  Social Connections: Not on file  Depression (EYV7-0): Not on file  Alcohol Screen: Not on file  Housing: Low Risk  (05/18/2024)   Received from [employer] [facility]   Epic    In the last 12 months, was there a time when you were not able to pay the mortgage or rent on time?: No    In the past 12 months, how many times have you moved where you were living?: 0    At any time in the past 12 months, were you homeless or living in a shelter (including now)?: No  Utilities: Not At Risk (05/18/2024)   Received from [facility]   Epic    In the past 12 months has the electric, gas, oil, or water company threatened to shut off services in your home?: No  Health Literacy: Not on file    Review of Systems   Gen: Denies fever, chills, anorexia. Denies fatigue, weakness, weight loss.  CV: Denies chest pain, palpitations, syncope, peripheral edema, and claudication. Resp: Denies dyspnea at rest, cough, wheezing, coughing up blood, and pleurisy. GI: See HPI Psych: Denies depression, anxiety, memory loss, confusion. No homicidal or suicidal ideation.  Heme: Denies bruising, bleeding, and enlarged lymph nodes.  Physical Exam   BP 110/74 (BP Location: Right Arm, Patient Position: Sitting, Cuff Size: Large)   Pulse (!) 59   Temp 97.8 F (36.6 C) (Temporal)   Ht 6' 2 (1.88 m)   Wt 283 lb 3.2 oz (128.5 kg)   BMI 36.36 kg/m   General:   Alert and oriented. No distress noted. Pleasant and cooperative.  Head:  Normocephalic and atraumatic. Eyes:  Conjuctiva  clear without scleral icterus. Mouth:  Oral mucosa pink and moist. Good dentition. No lesions. Lungs:  Clear to auscultation bilaterally. No wheezes, rales, or rhonchi. No distress.  Heart:  S1, S2 present without murmurs appreciated.  Abdomen:  +BS, soft, non-tender and non-distended. No rebound or guarding. No HSM or masses noted. Rectal: Deferred Msk:  Symmetrical without gross deformities. Normal posture. Extremities:  Without edema. Neurologic:  Alert and  oriented x4 Psych:  Alert and cooperative. Normal mood and affect.  Assessment & Plan  Steve Youngberg Lazare is a 54 y.o. male presenting today for cirrhosis follow-up, hospital follow-up  Alcohol Cirrhosis with history of hepatic encephalopathy and esophageal varices Chronic cirrhosis with prior hepatic encephalopathy and esophageal varices.  With recent episode of hepatic encephalopathy secondary to ileus post surgery.  Blood pressure and heart rate are well controlled on carvedilol . He remains adherent to a low sodium diet.  Continues to follow to liver clinic for consideration of possible liver transplantation given history of HCC.  No current signs of ascites or peripheral edema. - Refilled carvedilol  and zinc . - Confirmed continued use of Xifaxan  twice daily and lactulose  as needed with goal of 2-3 bowel movements daily - Confirmed discontinuation of torsemide  and spironolactone  per prior instructions. - Reinforced importance of low sodium diet and other sources lifestyle modifications - Needs regular follow-up for hepatoma surveillance and regular  monitoring of MELD labs and AFP.   Portal vein thrombosis Recent portal vein thrombosis, currently well-controlled on apixaban  2.5 mg twice daily without bleeding complications. Duration of anticoagulation remains to be determined pending specialist input and follow-up imaging.  Currently without any signs/symptoms of hepatic encephalopathy. - Refilled apixaban  for one month given he will  soon run out from prescription provided by discharging hospitalist. - Planned to contact Dr. Maree if needed to coordinate ongoing anticoagulation management until follow-up with hepatology or hematology. - Instructed him to confirm with liver clinic regarding scheduling of follow-up CT scan to assess clot resolution and surveillance of HCC.  History of hepatocellular carcinoma, status post ablation Ongoing surveillance is required due to prior hepatocellular carcinoma status post ablation. - Instructed him to confirm with liver clinic regarding follow-up CT scan for monitoring of possible additional liver lesion. - Planned to review results of upcoming imaging to assess for recurrence or new lesions.  Gastroesophageal reflux disease Chronic gastroesophageal reflux disease, well-controlled on pantoprazole  without current symptoms. - Confirmed continued use of pantoprazole  40 mg twice daily    Recent ventral hernia repair Admitted to [facility] for incarcerated ventral hernia, s/p repair of incarcerated hernia.  Incision healing well.  No pain/tenderness on exam today.  Having adequate bowel function. - Continue to follow-up with general surgery as needed  Follow up   Follow up 3 months with Therisa Stager, NP who he primarily sees in the clinic.    Charmaine Melia, MSN, FNP-BC, AGACNP-BC [facility] "

## 2024-06-25 ENCOUNTER — Ambulatory Visit: Admitting: Gastroenterology

## 2024-06-25 ENCOUNTER — Encounter: Payer: Self-pay | Admitting: Gastroenterology

## 2024-06-25 VITALS — BP 110/74 | HR 59 | Temp 97.8°F | Ht 74.0 in | Wt 283.2 lb

## 2024-06-25 DIAGNOSIS — Z9889 Other specified postprocedural states: Secondary | ICD-10-CM

## 2024-06-25 DIAGNOSIS — Z8505 Personal history of malignant neoplasm of liver: Secondary | ICD-10-CM

## 2024-06-25 DIAGNOSIS — K219 Gastro-esophageal reflux disease without esophagitis: Secondary | ICD-10-CM

## 2024-06-25 DIAGNOSIS — Z8719 Personal history of other diseases of the digestive system: Secondary | ICD-10-CM | POA: Diagnosis not present

## 2024-06-25 DIAGNOSIS — Z87891 Personal history of nicotine dependence: Secondary | ICD-10-CM

## 2024-06-25 DIAGNOSIS — C220 Liver cell carcinoma: Secondary | ICD-10-CM

## 2024-06-25 DIAGNOSIS — I81 Portal vein thrombosis: Secondary | ICD-10-CM

## 2024-06-25 DIAGNOSIS — K7030 Alcoholic cirrhosis of liver without ascites: Secondary | ICD-10-CM

## 2024-06-25 DIAGNOSIS — K769 Liver disease, unspecified: Secondary | ICD-10-CM

## 2024-06-25 MED ORDER — RIFAXIMIN 550 MG PO TABS: 550.0000 mg | ORAL_TABLET | Freq: Two times a day (BID) | ORAL | 5 refills | Status: AC

## 2024-06-25 MED ORDER — APIXABAN 2.5 MG PO TABS: 2.5000 mg | ORAL_TABLET | Freq: Two times a day (BID) | ORAL | 0 refills | Status: AC

## 2024-06-25 MED ORDER — CARVEDILOL 3.125 MG PO TABS: 3.1250 mg | ORAL_TABLET | Freq: Two times a day (BID) | ORAL | 5 refills | Status: AC

## 2024-06-25 MED ORDER — ZINC SULFATE 220 (50 ZN) MG PO CAPS: 220.0000 mg | ORAL_CAPSULE | Freq: Every day | ORAL | 11 refills | Status: AC

## 2024-06-25 NOTE — Patient Instructions (Addendum)
 Glad to see you have improved since your surgery and hospitalization!  Continue current medications: Eliquis  2.5 mg twice daily Xifaxan  550 mg twice daily Pantoprazole  40 mg twice daily Carvedilol  3.125 mg twice daily Zinc  daily Continue lactulose  as needed  I have sent in refills of everything for you today except for the pantoprazole  given it appears you already have enough refills on file for this.  Will send communication to Dr. Jamal office regarding ongoing refills of Eliquis  however I did send in at least 1 more month supply for you until you see [facility] hepatology.  I would recommend that Dr. Maree continue your Eliquis  ongoing if needed as it appears based on a quick review that you may not have an appointment with hematology (blood doctors) for several more months.  Cirrhosis Lifestyle Recommendations:  High-protein diet from a primarily plant-based diet. Avoid red meat.  No raw or undercooked meat, seafood, or shellfish. Low-fat/cholesterol/carbohydrate diet. Limit sodium to no more than 2000 mg/day including everything that you eat and drink. Recommend at least 30 minutes of aerobic and resistance exercise 3 days/week. Limit Tylenol  to 2000 mg daily.  goal 2-3 bowel movements daily with your lactulose   Follow up with Therisa Stager, NP in 3 months.   Please follow-up with [facility] liver clinic to see if they are ordering the repeat CT scan that is needed to determine if the clot in the portal vein is still present or not.  If they have not already ordered it or willing to order it please let us  know so that way we can follow-up on this.  It was a pleasure to see you today. I want to create trusting relationships with patients. If you receive a survey regarding your visit,  I greatly appreciate you taking time to fill this out on paper or through your MyChart. I value your feedback.  Charmaine Melia, MSN, FNP-BC, AGACNP-BC [facility]

## 2024-06-28 ENCOUNTER — Other Ambulatory Visit: Payer: Self-pay | Admitting: Gastroenterology

## 2024-07-01 ENCOUNTER — Telehealth: Payer: Self-pay | Admitting: Gastroenterology

## 2024-07-01 NOTE — Telephone Encounter (Signed)
 Opened in error

## 2024-07-09 ENCOUNTER — Ambulatory Visit: Admitting: Gastroenterology

## 2024-09-16 ENCOUNTER — Ambulatory Visit: Admitting: Gastroenterology
